# Patient Record
Sex: Female | Born: 1960 | Race: White | Hispanic: No | Marital: Single | Smoking: Former smoker
Health system: Southern US, Community
[De-identification: ages and names within clinical notes are randomized; demographics above are authoritative.]

## PROBLEM LIST (undated history)

## (undated) DIAGNOSIS — K319 Disease of stomach and duodenum, unspecified: Secondary | ICD-10-CM

## (undated) DIAGNOSIS — G4730 Sleep apnea, unspecified: Secondary | ICD-10-CM

## (undated) DIAGNOSIS — R51 Headache: Secondary | ICD-10-CM

## (undated) DIAGNOSIS — I10 Essential (primary) hypertension: Secondary | ICD-10-CM

## (undated) DIAGNOSIS — T7840XA Allergy, unspecified, initial encounter: Secondary | ICD-10-CM

## (undated) DIAGNOSIS — R519 Headache, unspecified: Secondary | ICD-10-CM

## (undated) DIAGNOSIS — E039 Hypothyroidism, unspecified: Secondary | ICD-10-CM

## (undated) DIAGNOSIS — K219 Gastro-esophageal reflux disease without esophagitis: Secondary | ICD-10-CM

## (undated) DIAGNOSIS — E785 Hyperlipidemia, unspecified: Secondary | ICD-10-CM

## (undated) DIAGNOSIS — M5416 Radiculopathy, lumbar region: Secondary | ICD-10-CM

## (undated) DIAGNOSIS — R911 Solitary pulmonary nodule: Secondary | ICD-10-CM

## (undated) HISTORY — DX: Allergy, unspecified, initial encounter: T78.40XA

## (undated) HISTORY — PX: ESOPHAGOGASTRODUODENOSCOPY: SHX1529

## (undated) HISTORY — PX: BLADDER SURGERY: SHX569

## (undated) HISTORY — PX: APPENDECTOMY: SHX54

## (undated) HISTORY — DX: Hyperlipidemia, unspecified: E78.5

## (undated) HISTORY — DX: Essential (primary) hypertension: I10

## (undated) HISTORY — PX: FOOT SURGERY: SHX648

---
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
## 1980-09-14 HISTORY — PX: ELBOW SURGERY: SHX618

## 2005-09-14 HISTORY — PX: FOOT SURGERY: SHX648

## 2006-09-14 HISTORY — PX: APPENDECTOMY: SHX54

## 2009-04-20 ENCOUNTER — Ambulatory Visit: Payer: Self-pay | Admitting: Internal Medicine

## 2009-05-30 ENCOUNTER — Ambulatory Visit: Payer: Self-pay | Admitting: Gastroenterology

## 2009-09-01 ENCOUNTER — Ambulatory Visit: Payer: Self-pay | Admitting: Internal Medicine

## 2009-09-01 IMAGING — CR DG RIBS 2V*R*
1 series · 4 of 4 positions shown · non-contrast
Comparison: none

REASON FOR EXAM: PAIN
COMMENTS:

PROCEDURE:     MDR - MDR RIBS RIGHT UNLILATERAL  - [DATE]  [DATE]
RESULT:     No evidence of displaced rib fracture or pneumothorax.

[Series 1: view not recorded · 0.17mm/px · 4 of 4 slices shown]
[im 1/4]
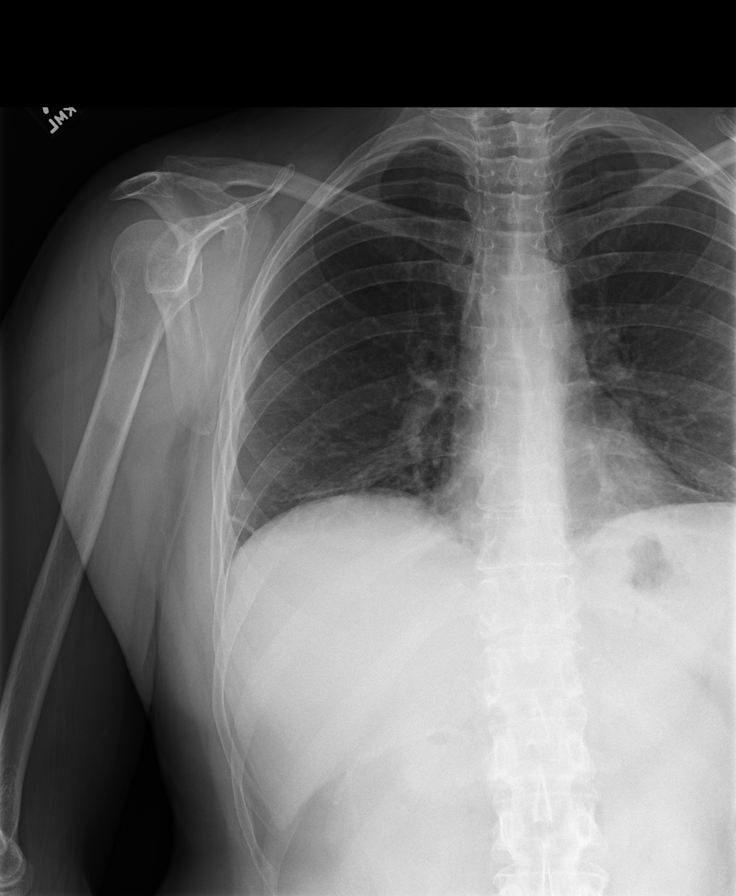
[im 2/4]
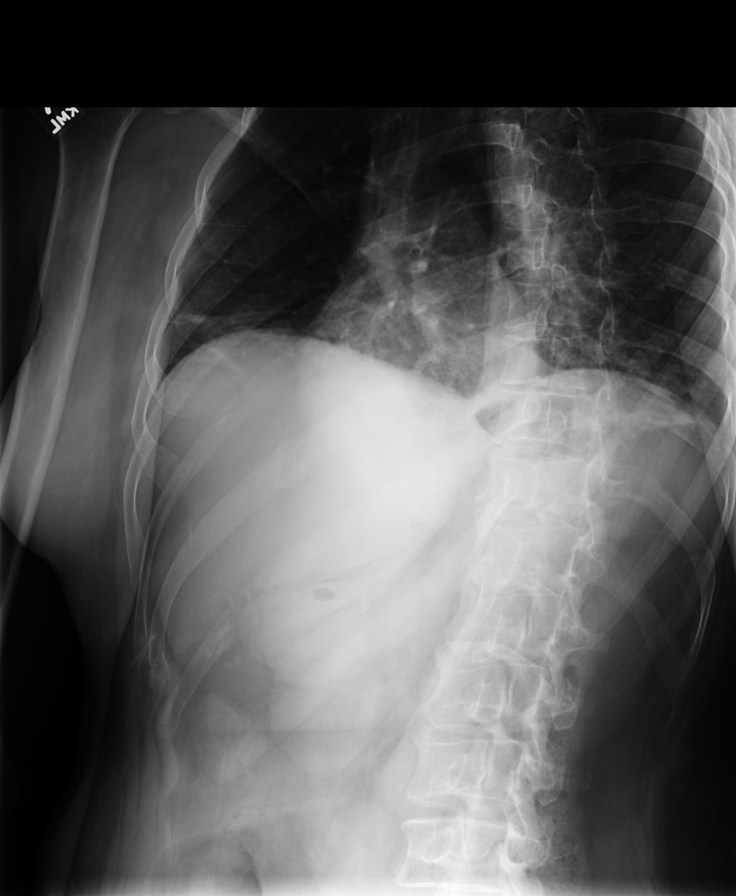
[im 3/4]
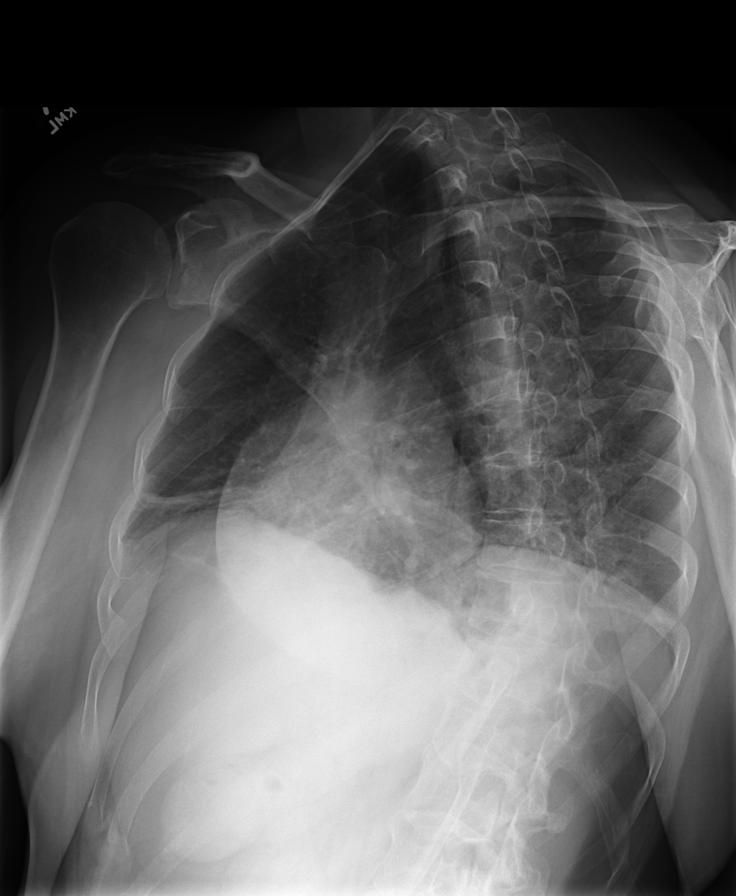
[im 4/4]
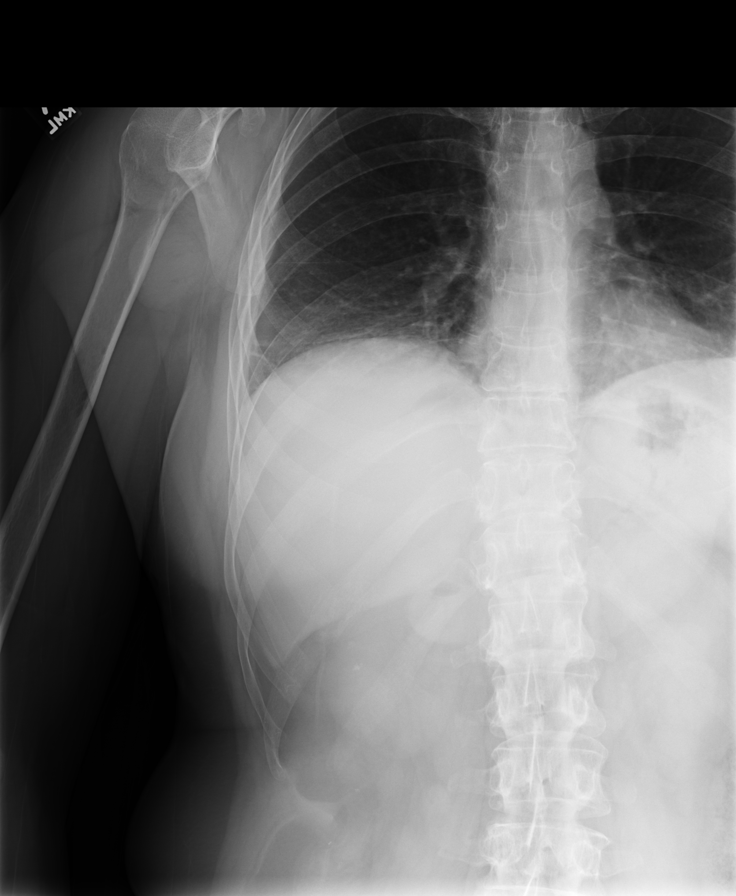

[4 of 4 positions shown; findings below may reference images not displayed]

IMPRESSION: No evidence of displaced rib fracture or pneumothorax. Mild basilar
atelectasis present on the right.

## 2011-06-25 ENCOUNTER — Ambulatory Visit: Payer: Self-pay | Admitting: Family Medicine

## 2012-09-01 ENCOUNTER — Ambulatory Visit: Payer: Self-pay | Admitting: Family Medicine

## 2012-09-01 IMAGING — MG MM DIGITAL SCREENING BILAT W/ CAD
1 series · 4 of 4 positions shown · non-contrast
Comparison: none

REASON FOR EXAM: SCR MAMMO NO ORDER
COMMENTS:

[R CC · right · 4 of 4 slices shown]
[im 1/4]
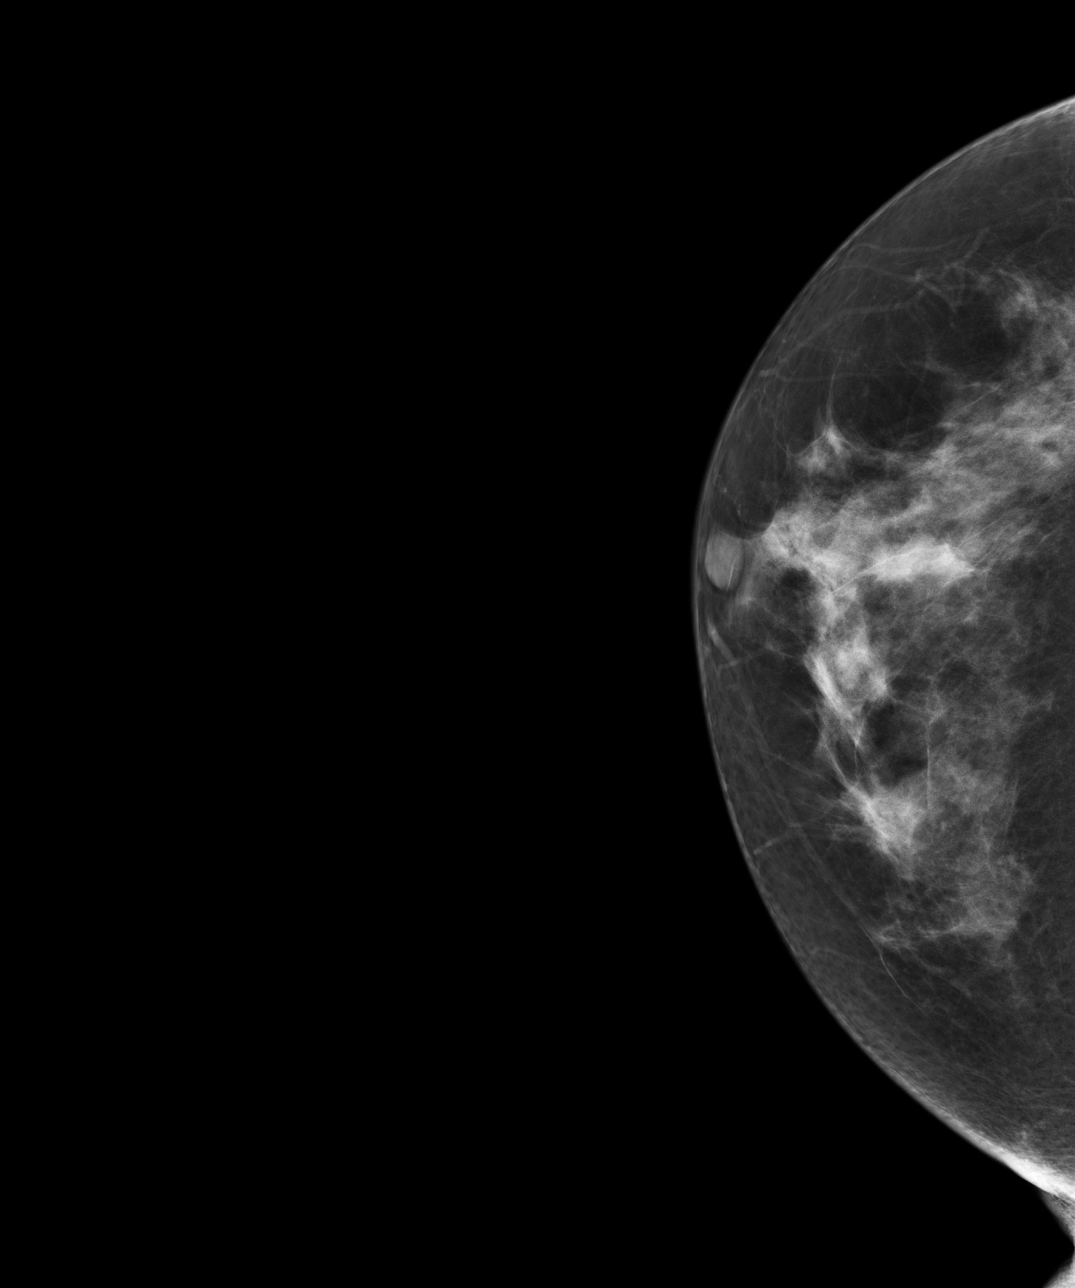
[im 2/4]
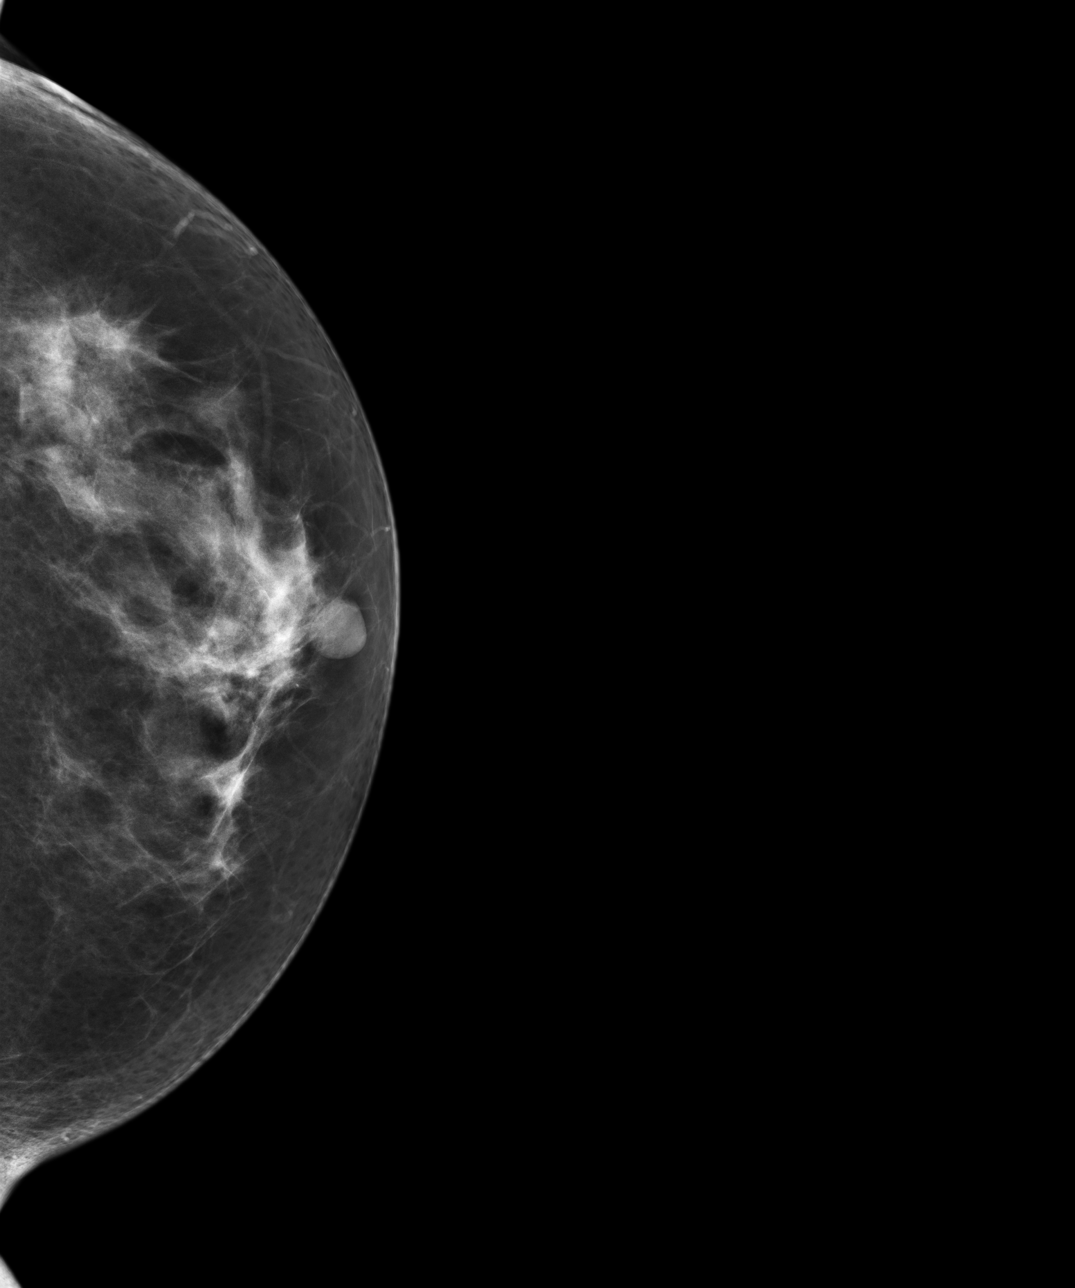
[im 3/4]
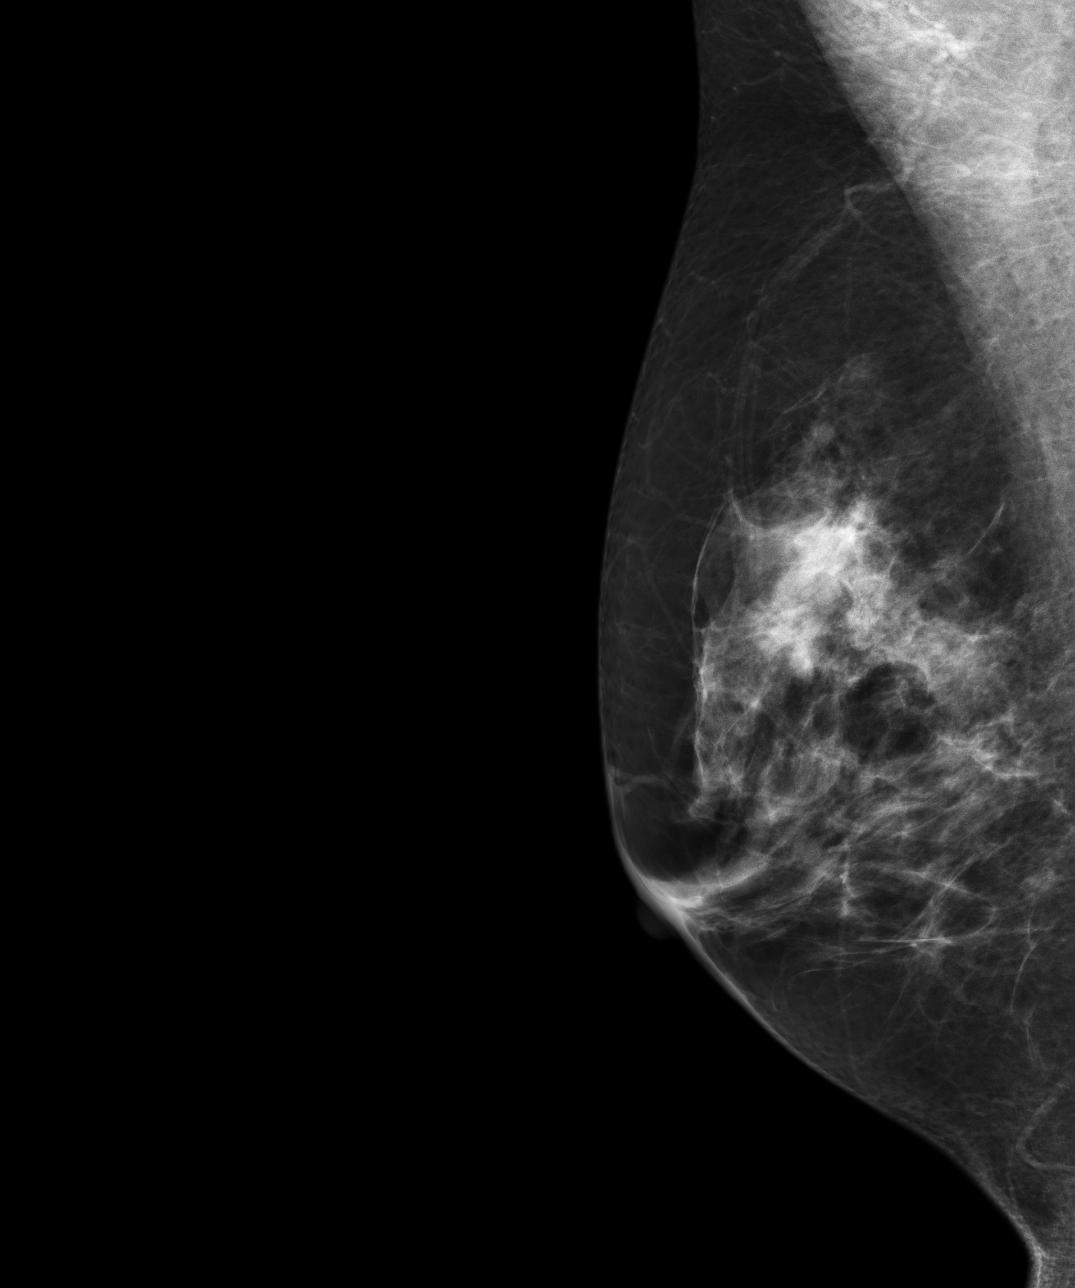
[im 4/4]
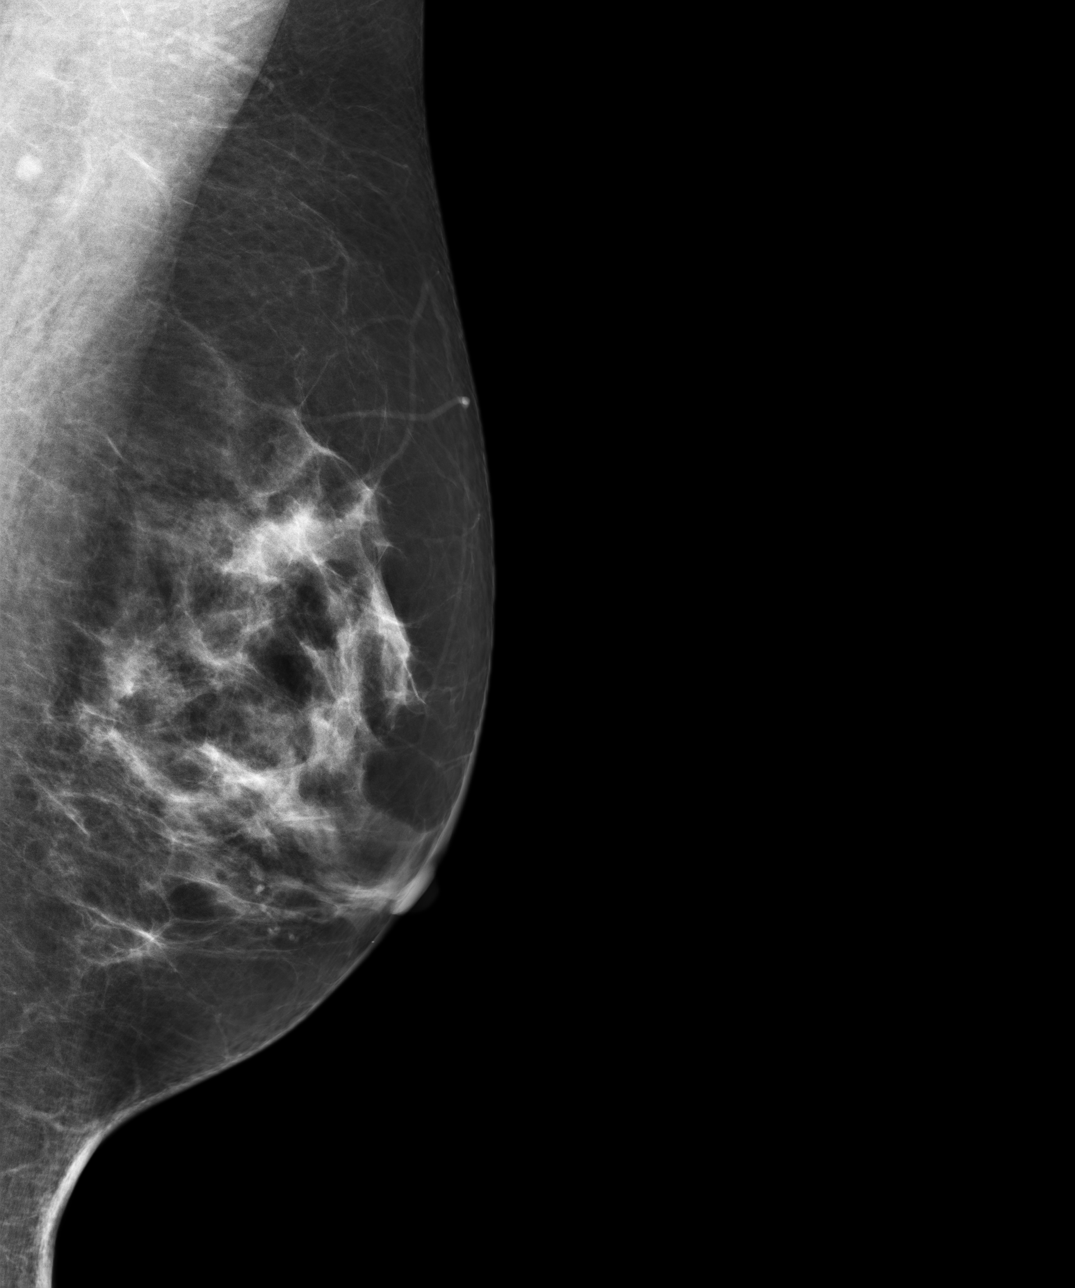

[4 of 4 positions shown; findings below may reference images not displayed]

PROCEDURE:     MMM - MMM DGT SCR NO ORDER W/CAD  - [DATE]  [DATE]

RESULT:     Comparison made to previous digital studies [DATE],[DATE], and [DATE] [GV].

The breasts exhibit a heterogeneously dense parenchymal pattern. No
malignant appearing groupings of microcalcification are demonstrated. On the
right there is increased density demonstrated in the midportion of the
breast superiorly. The area in question has been marked electronically and
the images saved.
IMPRESSION: There is increased conspicuity of nodularity on the right
superiorly at approximately the [DATE] position. This merits further
evaluation with spot compression views and a true lateral film. The area in
question has been marked electronically and the images saved.

BI-RADS 0: Additional workup of the right breast with supplemental
mammographic views and possible ultrasound is recommended.

A NEGATIVE MAMMOGRAM REPORT DOES NOT PRECLUDE BIOPSY OR OTHER EVALUATION OF
A CLINICALLY PALPABLE OR OTHERWISE SUSPICIOUS MASS OR LESION. BREAST CANCER
MAY NOT BE DETECTED BY MAMMOGRAPHY IN UP TO 10% OF CASES.

[REDACTED]

## 2012-09-02 ENCOUNTER — Ambulatory Visit: Payer: Self-pay | Admitting: Family Medicine

## 2012-09-02 IMAGING — MG MM ADDITIONAL VIEWS AT NO CHARGE
1 series · 4 of 4 positions shown · non-contrast
Comparison: none

REASON FOR EXAM: av rt nodularity
COMMENTS:

[R ML · right · 4 of 4 slices shown]
[im 1/4]
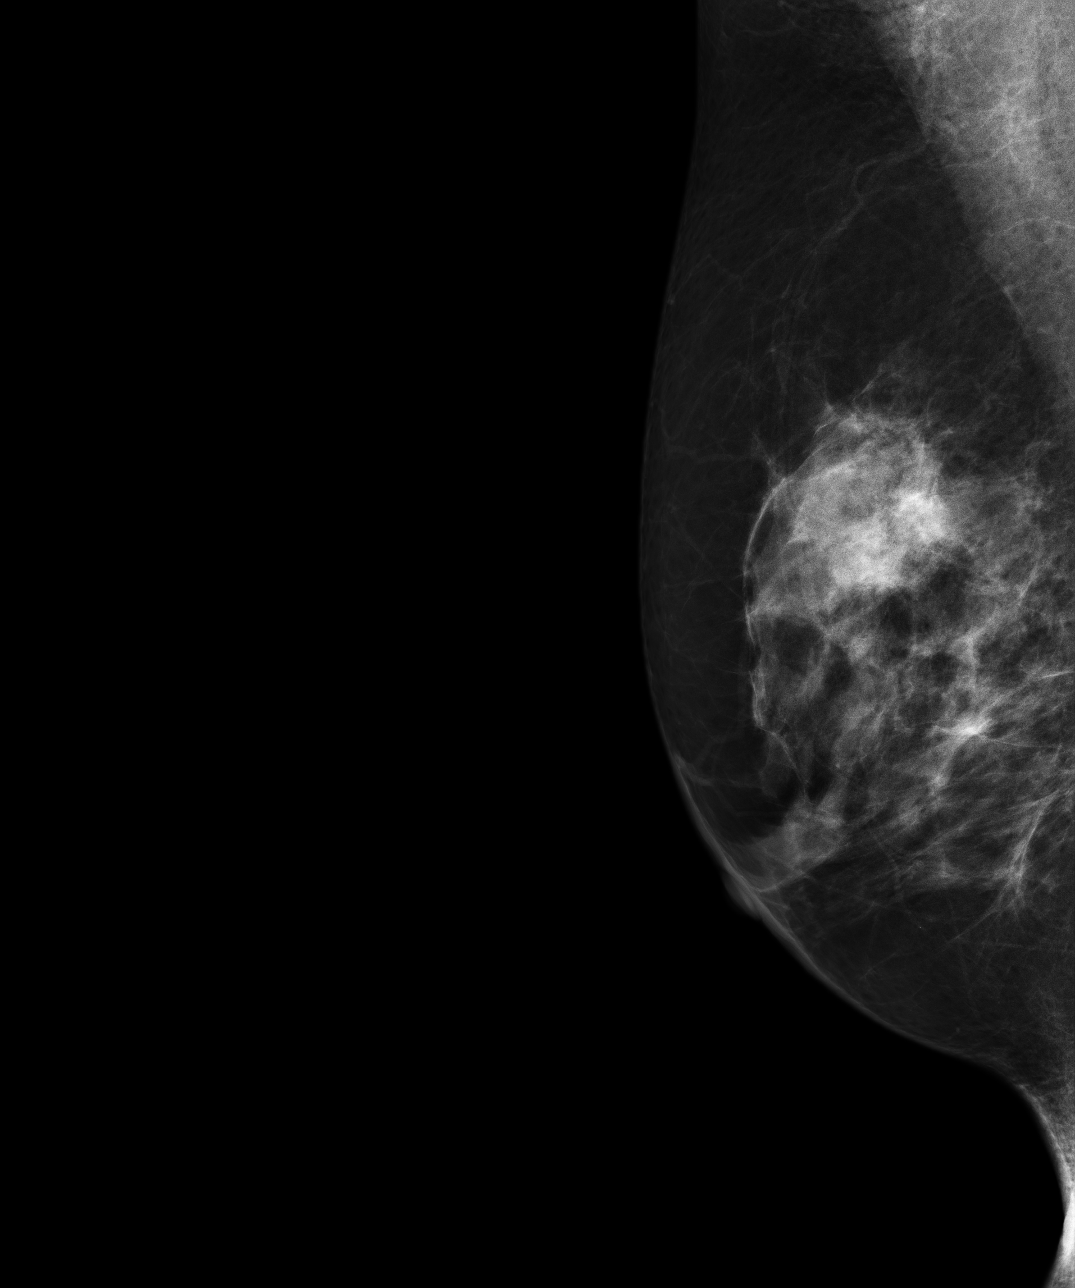
[im 2/4]
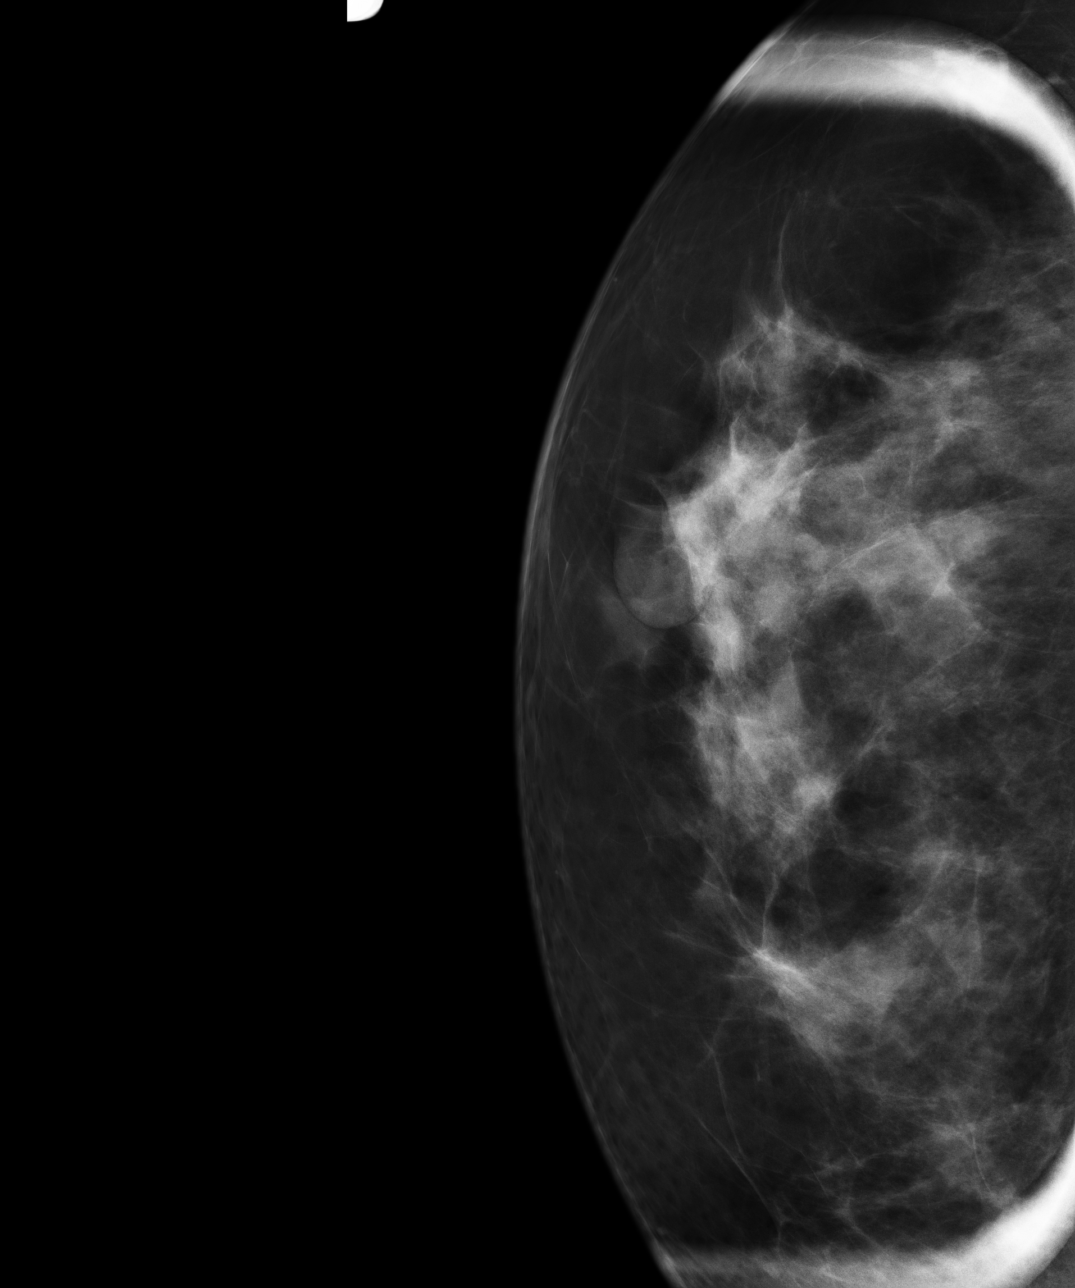
[im 3/4]
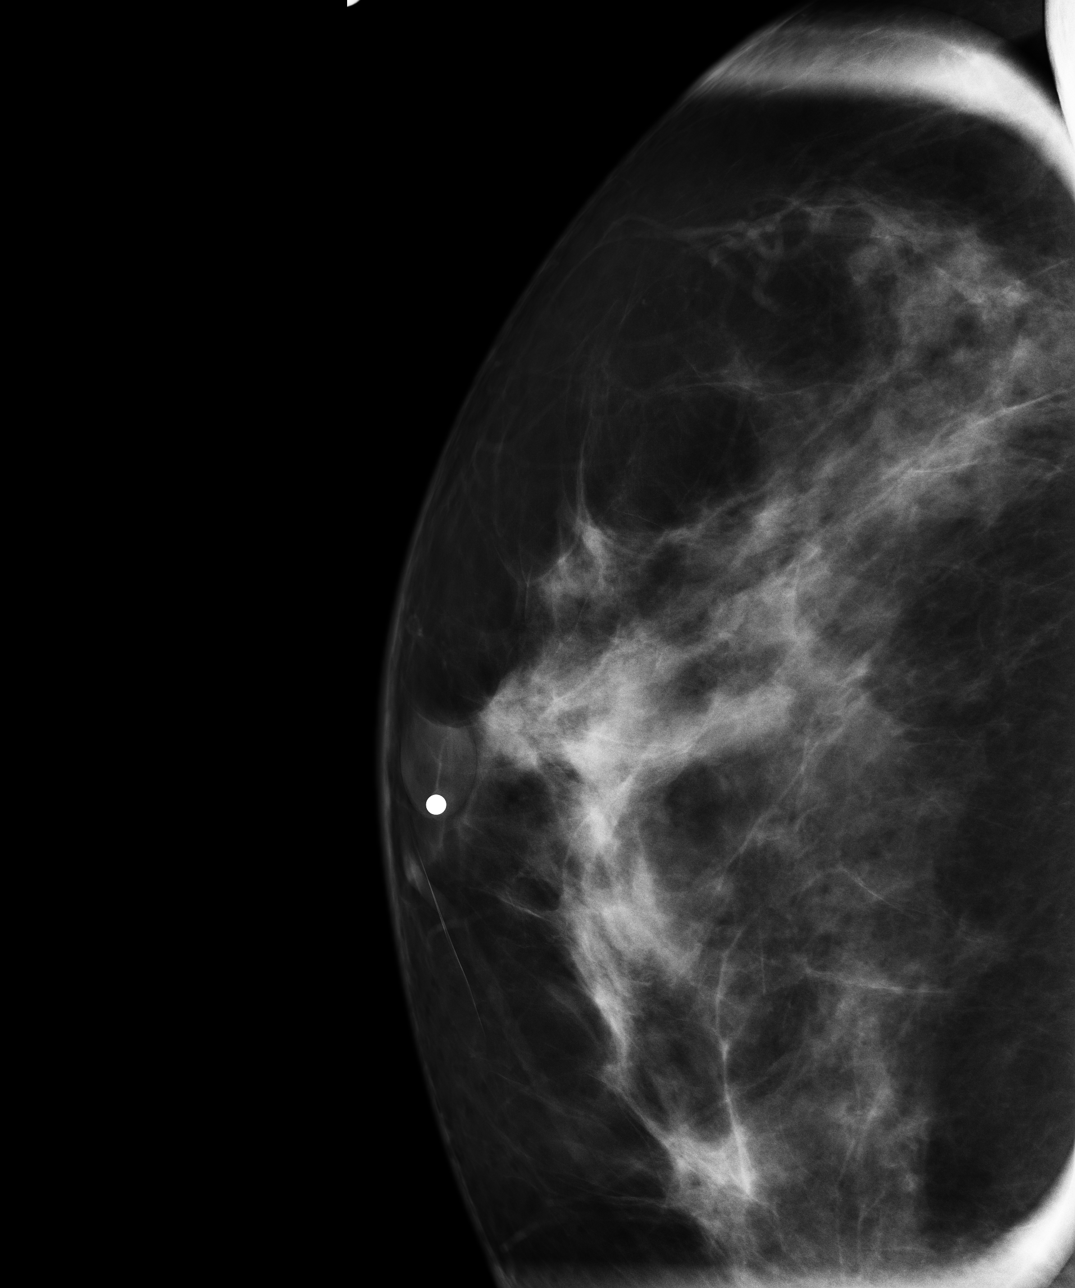
[im 4/4]
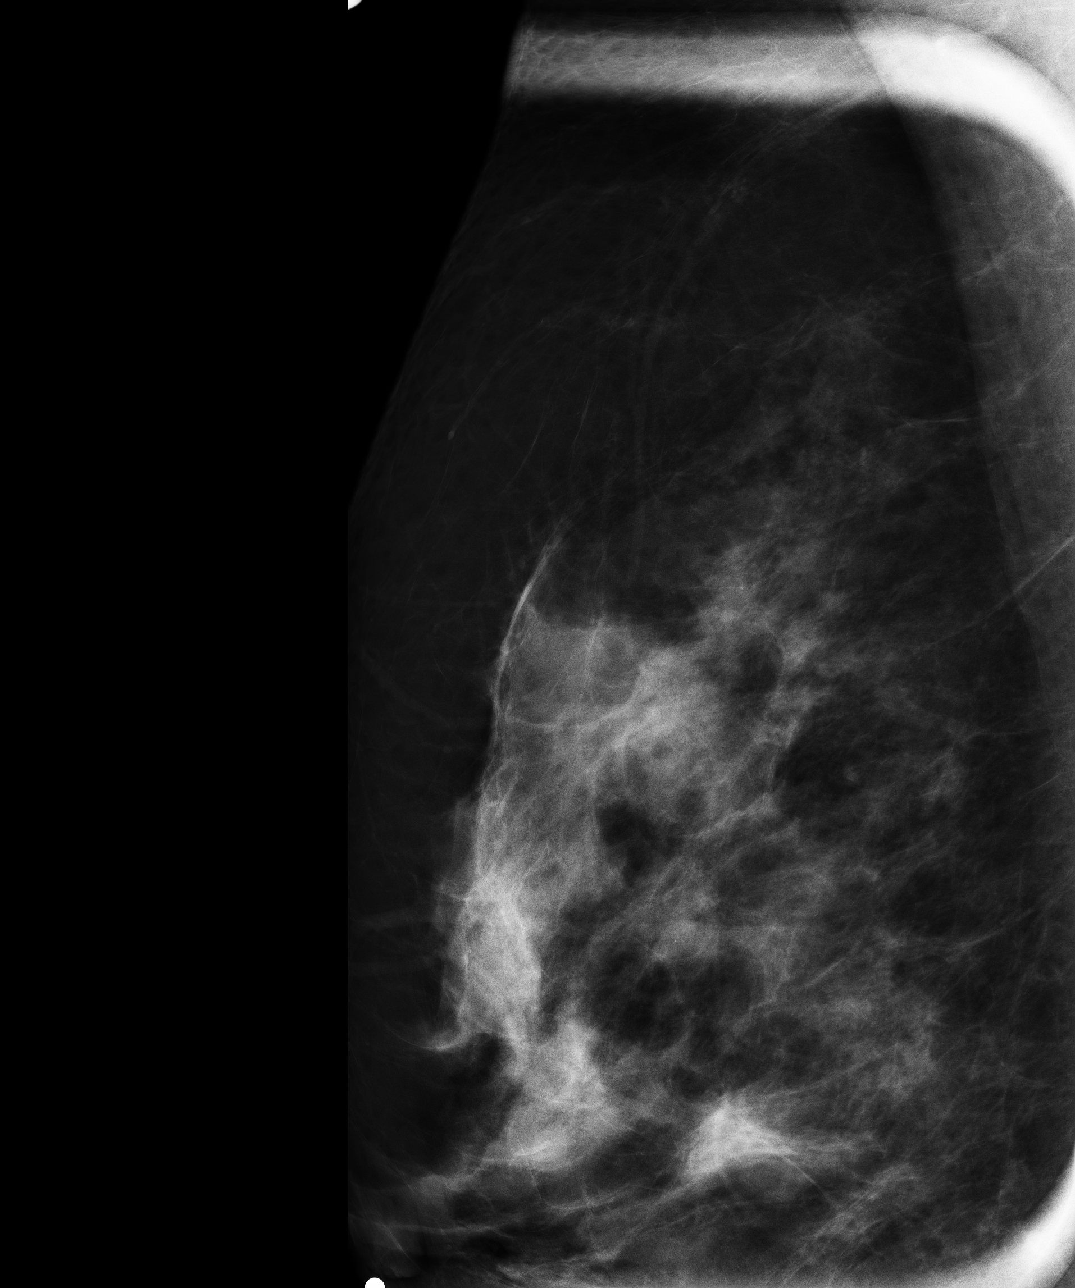

[4 of 4 positions shown; findings below may reference images not displayed]

PROCEDURE:     MAM - MAM DIG ADDVIEWS RT SCR  - [DATE]  [DATE]

RESULT:     On the screening mammogram [DATE], nodularity was
noted superiorly on the right which was felt to correspond to the density
seen along the medial aspect of the right breast as well as in the deep
retroareolar region. The patient was asked to return today for supplemental
mammographic views and ultrasound.

Spot compression MLO and CC views were obtained. A true lateral film was
obtained as well. The breast parenchyma is moderately dense. No discrete
masses are demonstrated. Ultrasound performed today revealed dilated ducts
at the [DATE] position.
IMPRESSION: The ultrasound images as well as a supplemental
mammographic views do not reveal findings suspicious for developing mass.

BI-RADS 3: A 6 month followup mammogram of the right breast is recommended
to assure ongoing stability. Continued annual mammographic followup of the
left breast is recommended.

A NEGATIVE MAMMOGRAM REPORT DOES NOT PRECLUDE BIOPSY OR OTHER EVALUATION OF
A CLINICALLY PALPABLE OR OTHERWISE SUSPICIOUS MASS OR LESION. BREAST CANCER
MAY NOT BE DETECTED BY MAMMOGRAPHY IN UP TO 10% OF CASES.

[REDACTED]

## 2012-09-02 IMAGING — US ULTRASOUND RIGHT BREAST
1 series · 14 of 16 positions shown · non-contrast
Comparison: none

REASON FOR EXAM: av rt nodularity
COMMENTS:

PROCEDURE:     US  - US BREAST RIGHT  - [DATE]  [DATE]
RESULT:     The patient underwent ultrasound of the anterior and mid aspects
of the right breast from the [DATE] to the [DATE] position. There are a few
mildly dilated ducts at the [DATE] position with the largest measuring just
over 3 mm.

[Series 1: ultrasound right breast · 0.08mm/px · 14 of 16 slices shown]
[im 1/16]
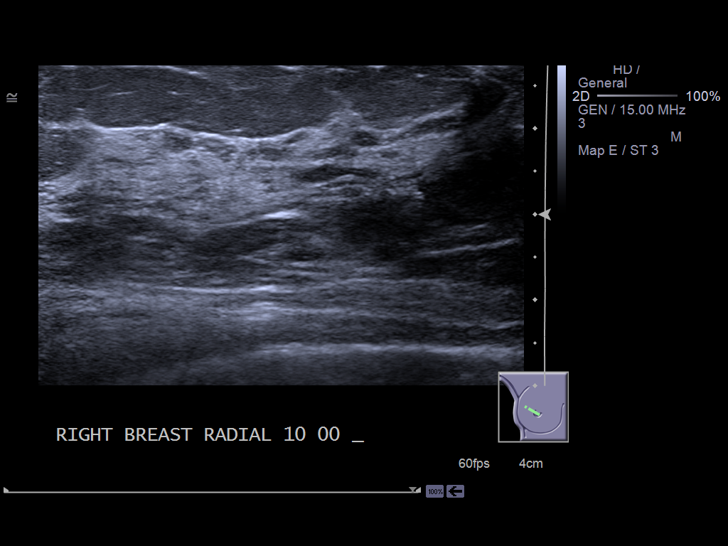
[im 2/16]
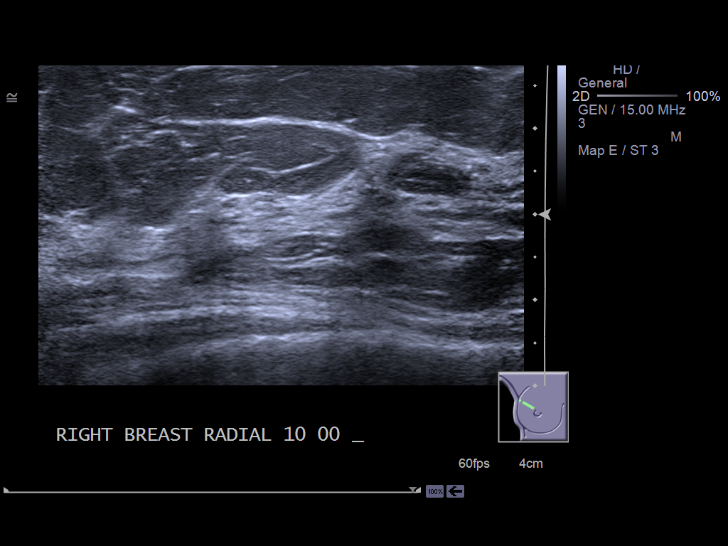
[im 3/16]
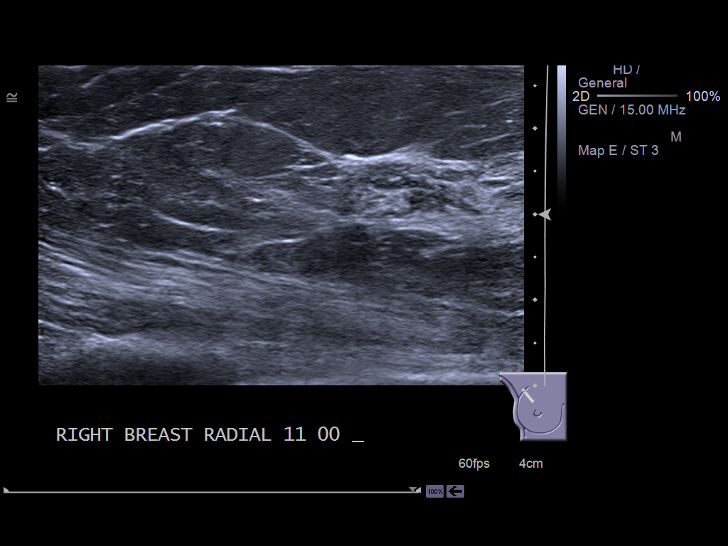
[im 5/16]
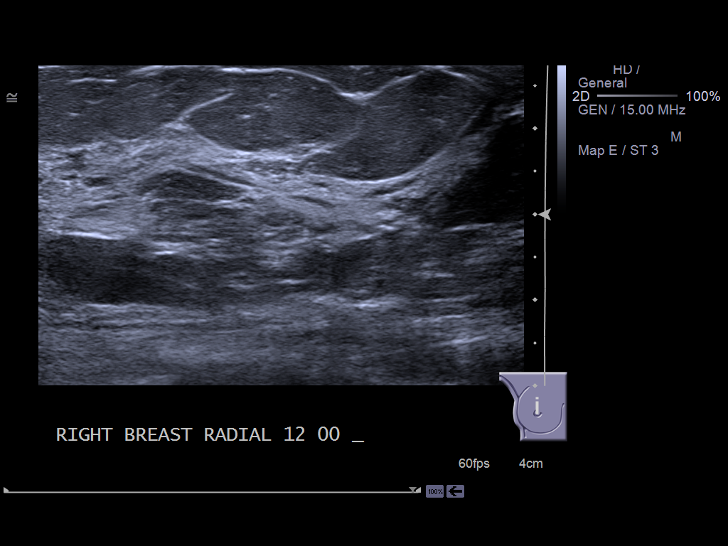
[im 6/16]
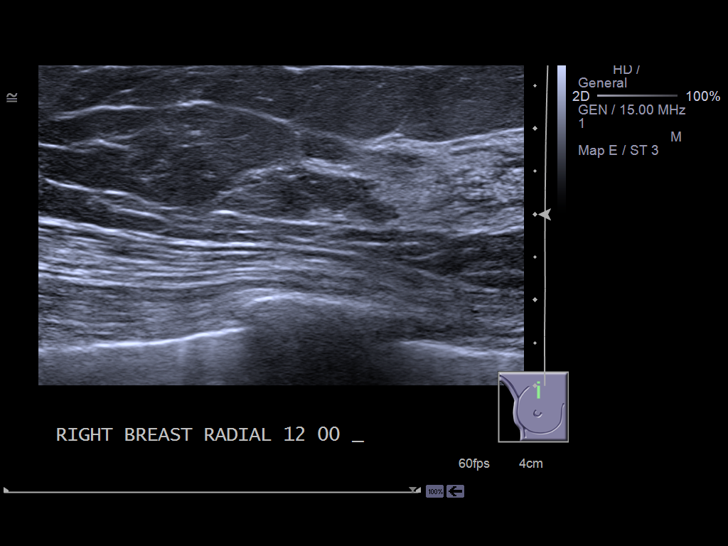
[im 7/16]
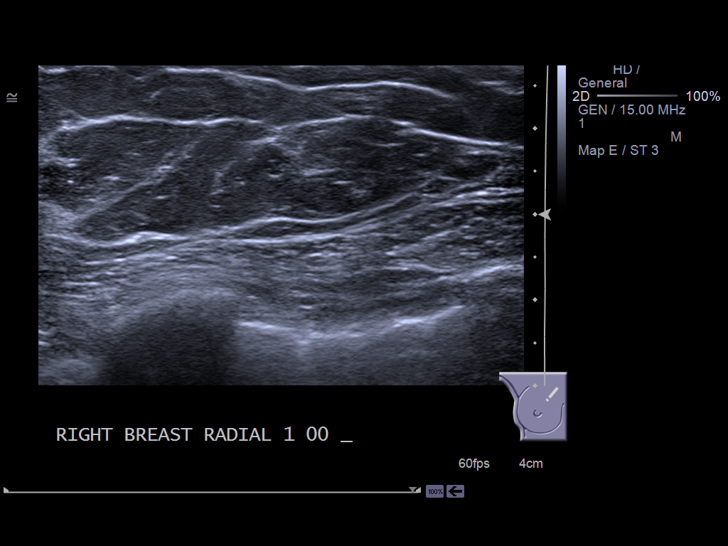
[im 8/16]
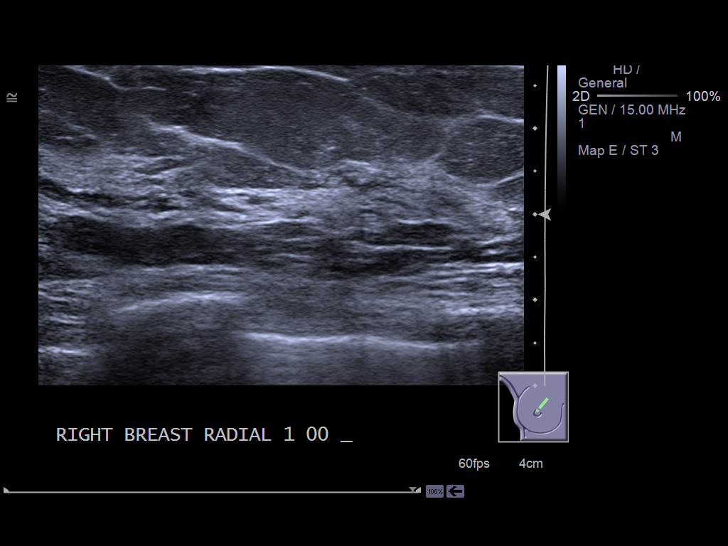
[im 9/16]
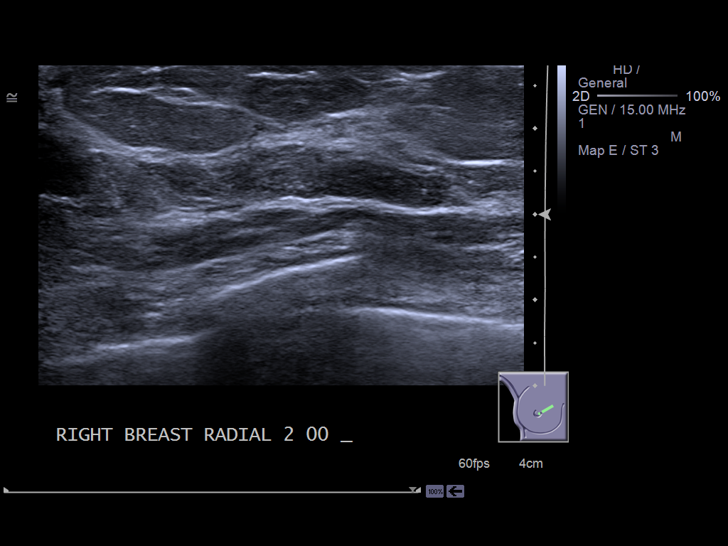
[im 10/16]
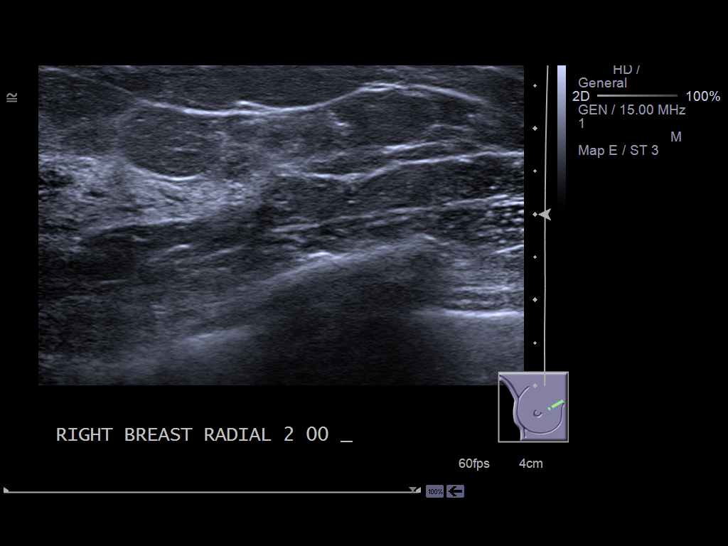
[im 11/16]
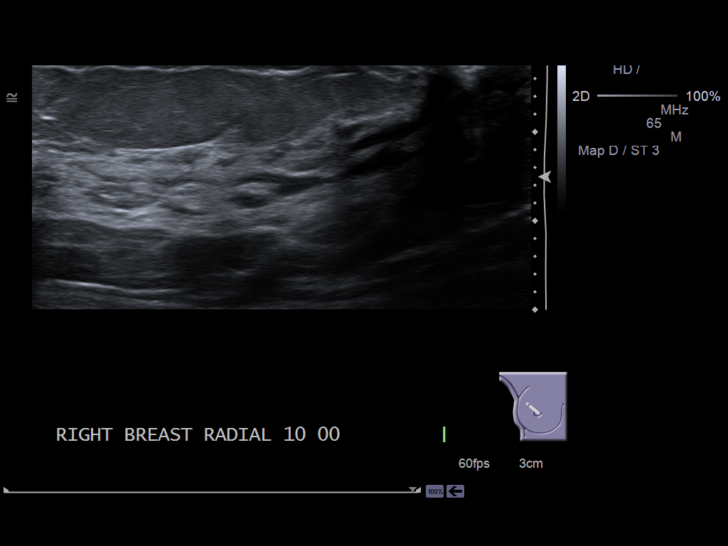
[im 13/16]
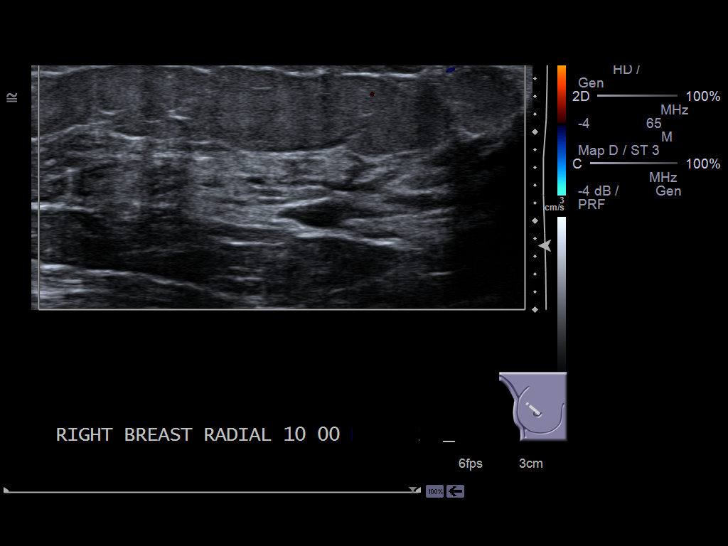
[im 14/16]
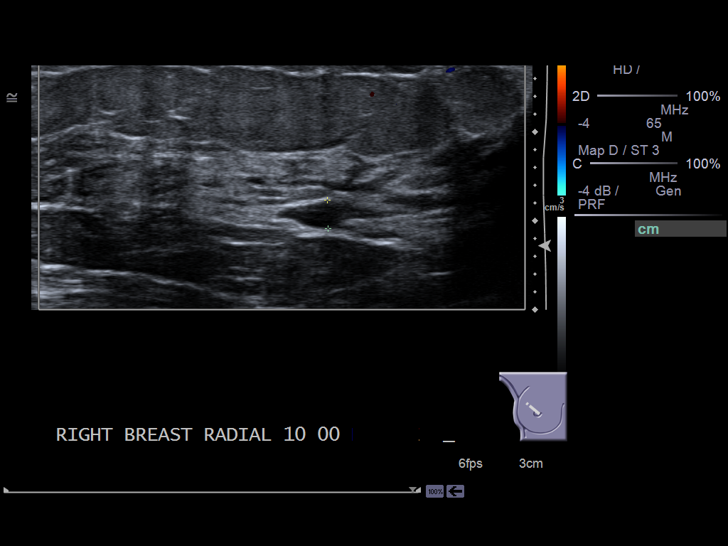
[im 15/16]
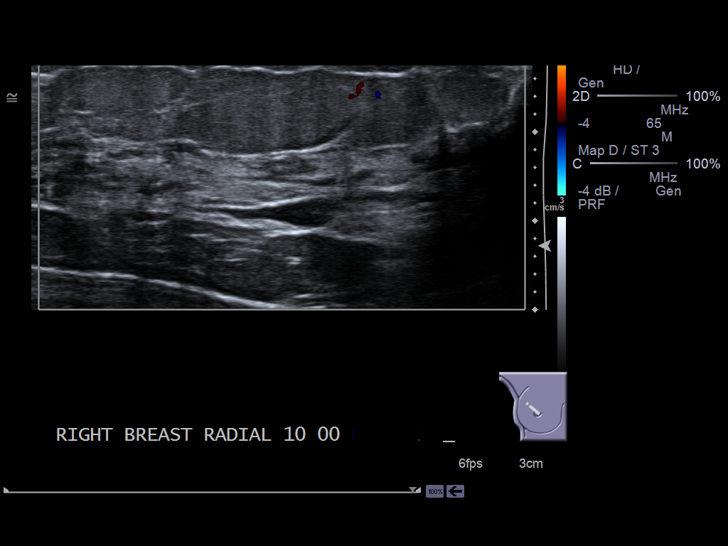
[im 16/16]
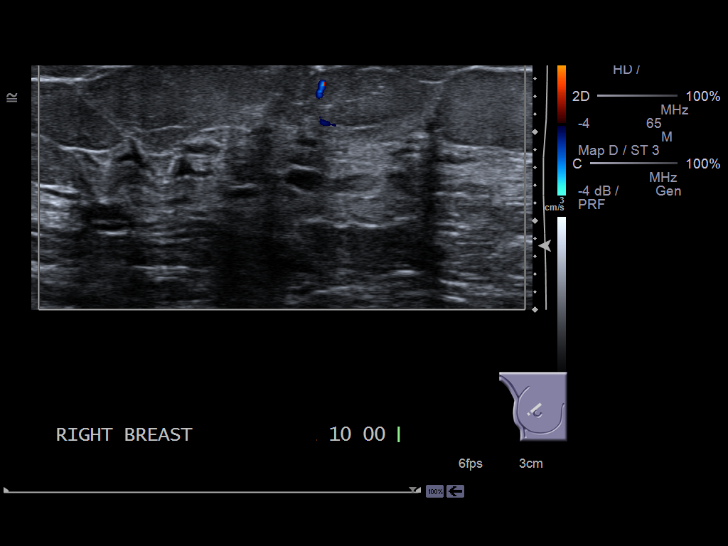

[14 of 16 positions shown; findings below may reference images not displayed]

IMPRESSION: No suspicious masses are demonstrated in the imaged
portions of the right breast. Please see the dictation of the supplemental
mammographic views of the right breast mammogram of of today's date for
final recommendations and BI-RADS classification.

A NEGATIVE MAMMOGRAM REPORT DOES NOT PRECLUDE BIOPSY OR OTHER EVALUATION OF
A CLINICALLY PALPABLE OR OTHERWISE SUSPICIOUS MASS OR LESION. BREAST CANCER
MAY NOT BE DETECTED BY MAMMOGRAPHY IN UP TO 10% OF CASES.

[REDACTED]

## 2013-03-21 ENCOUNTER — Ambulatory Visit: Payer: Self-pay | Admitting: Family Medicine

## 2013-03-21 IMAGING — MG MM MAMMO DIAGNOSTIC UNILATERAL*R*
1 series · 5 of 5 positions shown · non-contrast
Comparison: none

REASON FOR EXAM: RT BR NODULARITY
COMMENTS:

[R CC · right · 5 of 5 slices shown]
[im 1/5]
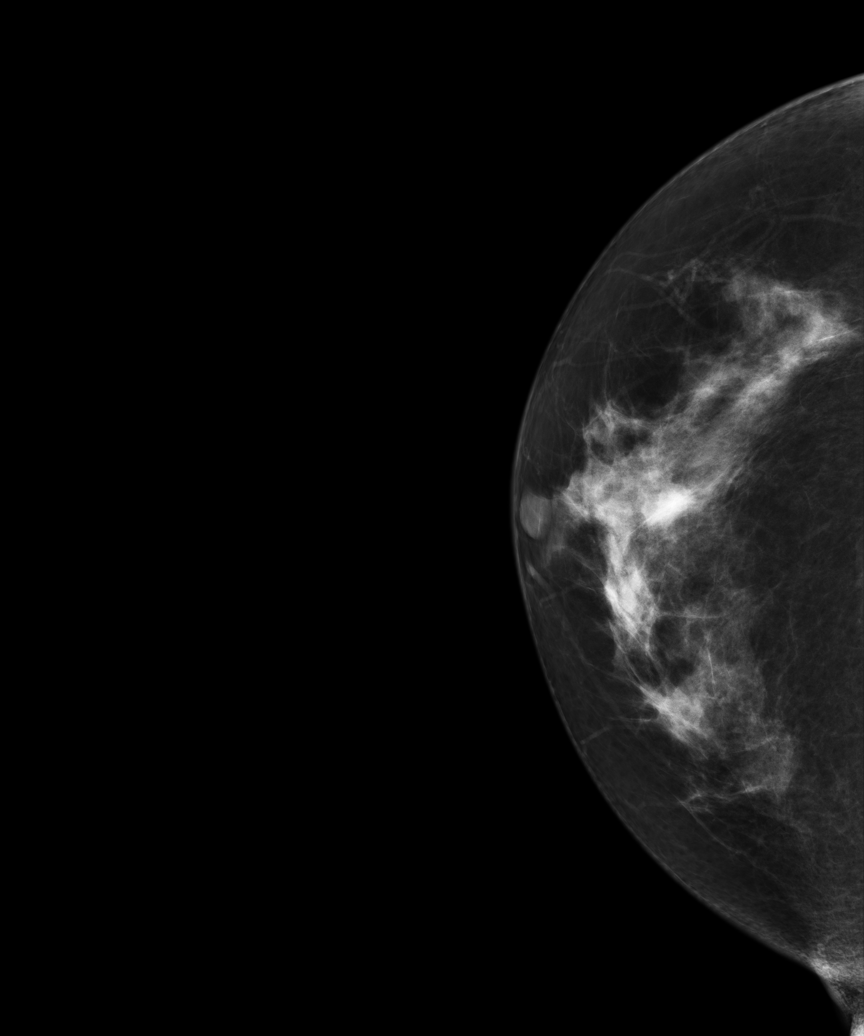
[im 2/5]
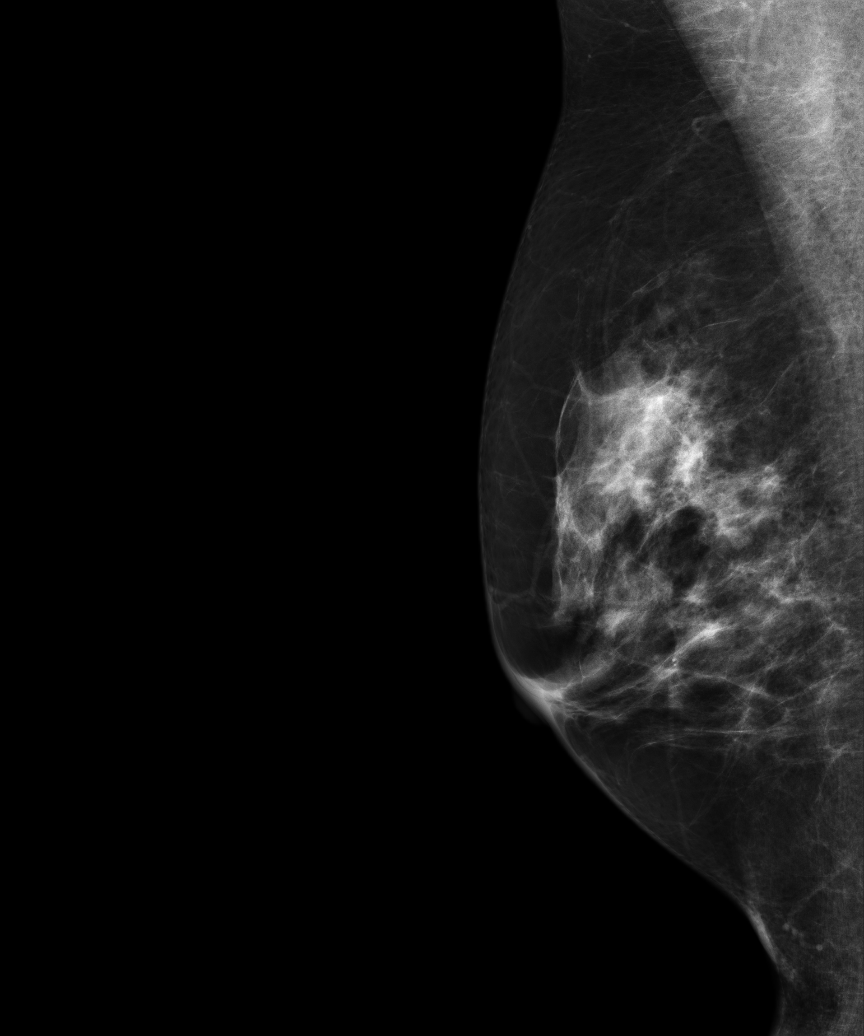
[im 3/5]
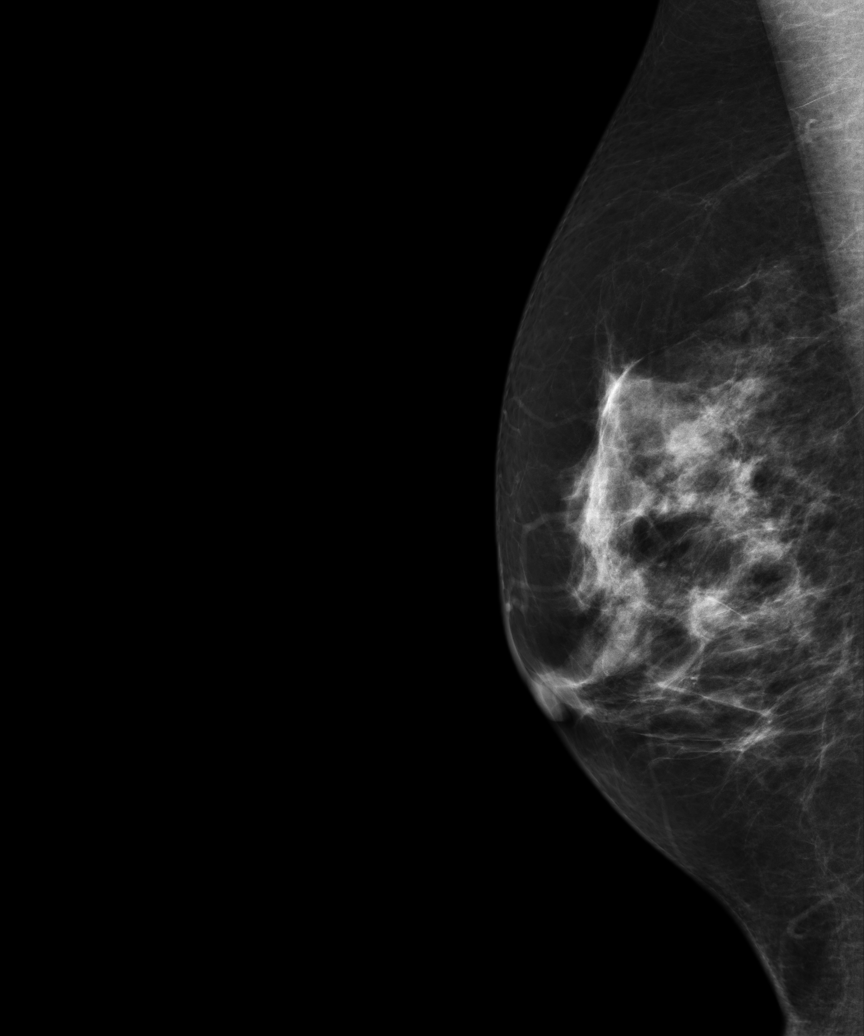
[im 4/5]
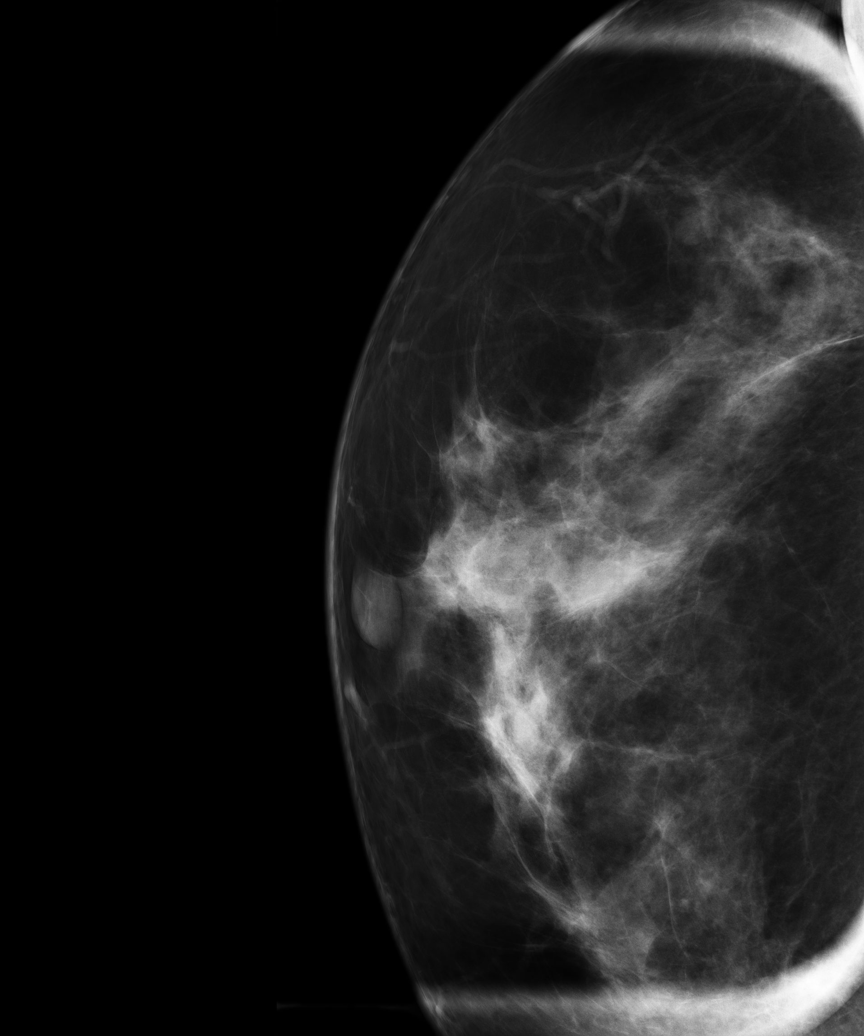
[im 5/5]
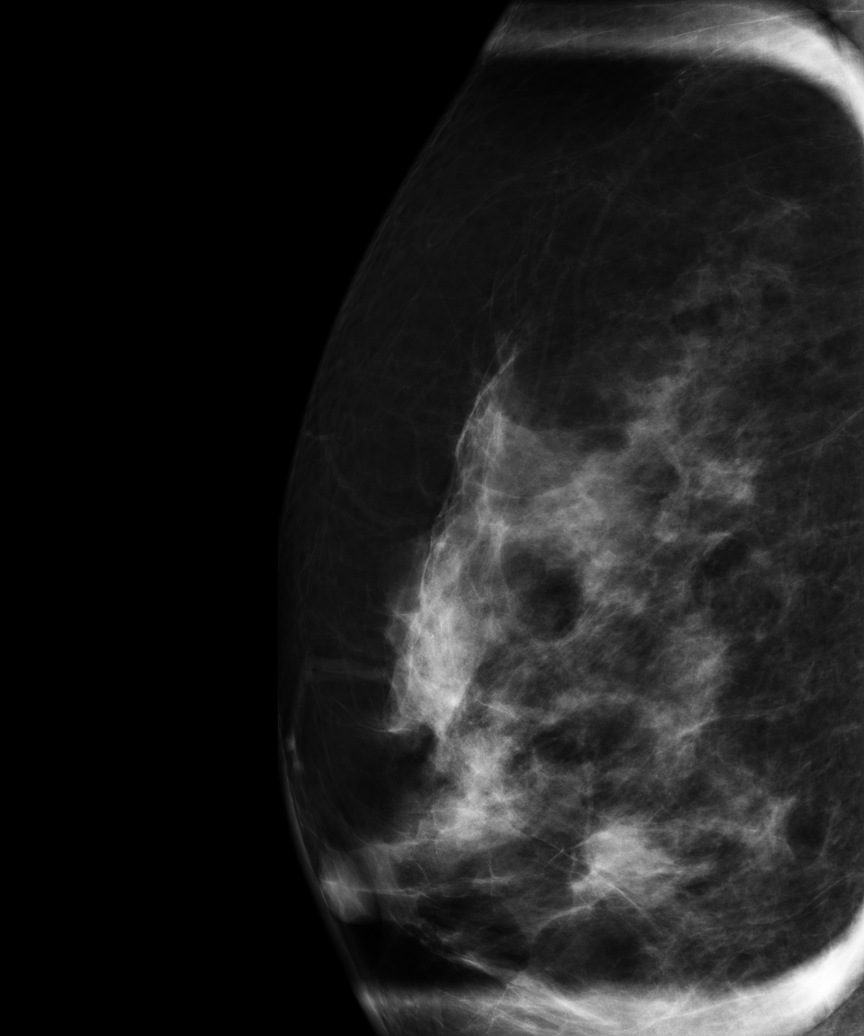

[5 of 5 positions shown; findings below may reference images not displayed]

PROCEDURE:     MAM - MAM DGTL UNI MAM RT BREAST W/CAD  - [DATE] [DATE]

RESULT:     The current exam is a 6 month followup study from [DATE],
at which time the patient was undergoing evaluation of parenchymal density
in the upper aspect of the right breast. At that time ultrasound and spot
compression views were not suspicious. It was felt prudent to proceed with 6
month followup mammography.

The right breast reveals a scattered fibroglandular pattern. There is an
area of nodularity noted in the anterior aspect of the breast in the
retroareolar region demonstrated best on the cc view. This appears stable.
There are no malignant appearing groupings of microcalcification and there
are no areas of new architectural distortion.
IMPRESSION: There are no findings suspicious for malignancy.

BI-RADS 2: Benign findings.

Recommendation: please return to yearly mammographic followup of both
breasts i.e. in [DATE].

BREAST COMPOSITION: The breast composition is HETEROGENEOUSLY DENSE
(glandular tissue is 51-75%) This may decrease the sensitivity of
mammography.

A NEGATIVE MAMMOGRAM REPORT DOES NOT PRECLUDE BIOPSY OR OTHER EVALUATION OF
A CLINICALLY PALPABLE OR OTHERWISE SUSPICIOUS MASS OR LESION. BREAST CANCER
MAY NOT BE DETECTED BY MAMMOGRAPHY IN UP TO 10% OF CASES.

Dictation site:1

## 2014-03-27 ENCOUNTER — Ambulatory Visit: Payer: Self-pay | Admitting: Family Medicine

## 2014-03-27 IMAGING — MG MM DIGITAL SCREENING BILAT W/ CAD
1 series · 4 of 4 positions shown · non-contrast
Comparison: Previous exam(s).

CLINICAL DATA: Screening.

EXAM:
DIGITAL SCREENING BILATERAL MAMMOGRAM WITH CAD

[R CC · right · 4 of 4 slices shown]
[im 1/4]
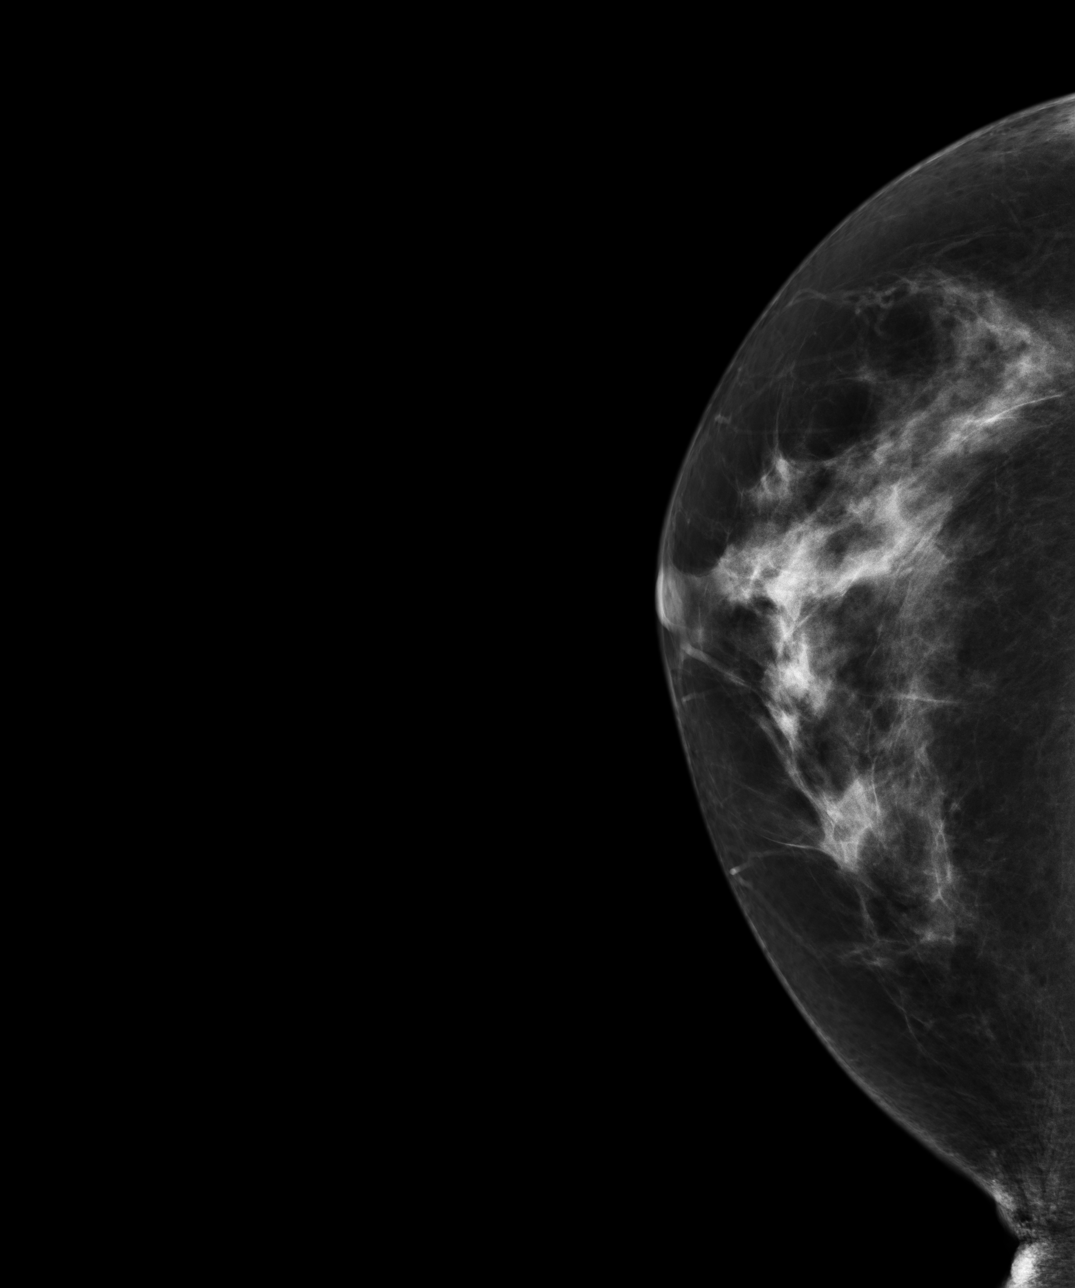
[im 2/4]
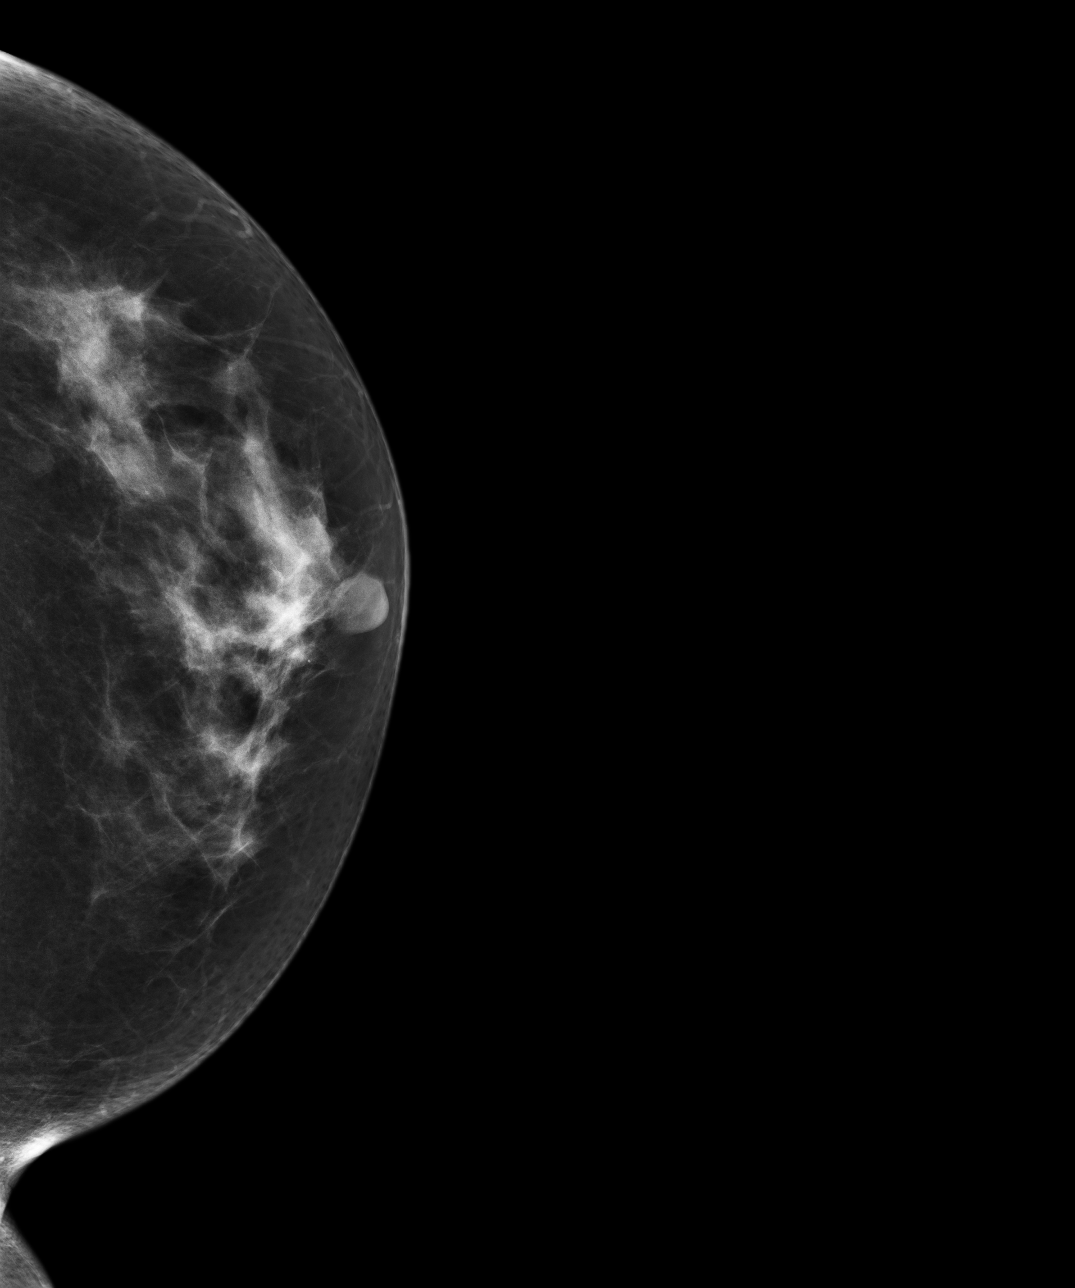
[im 3/4]
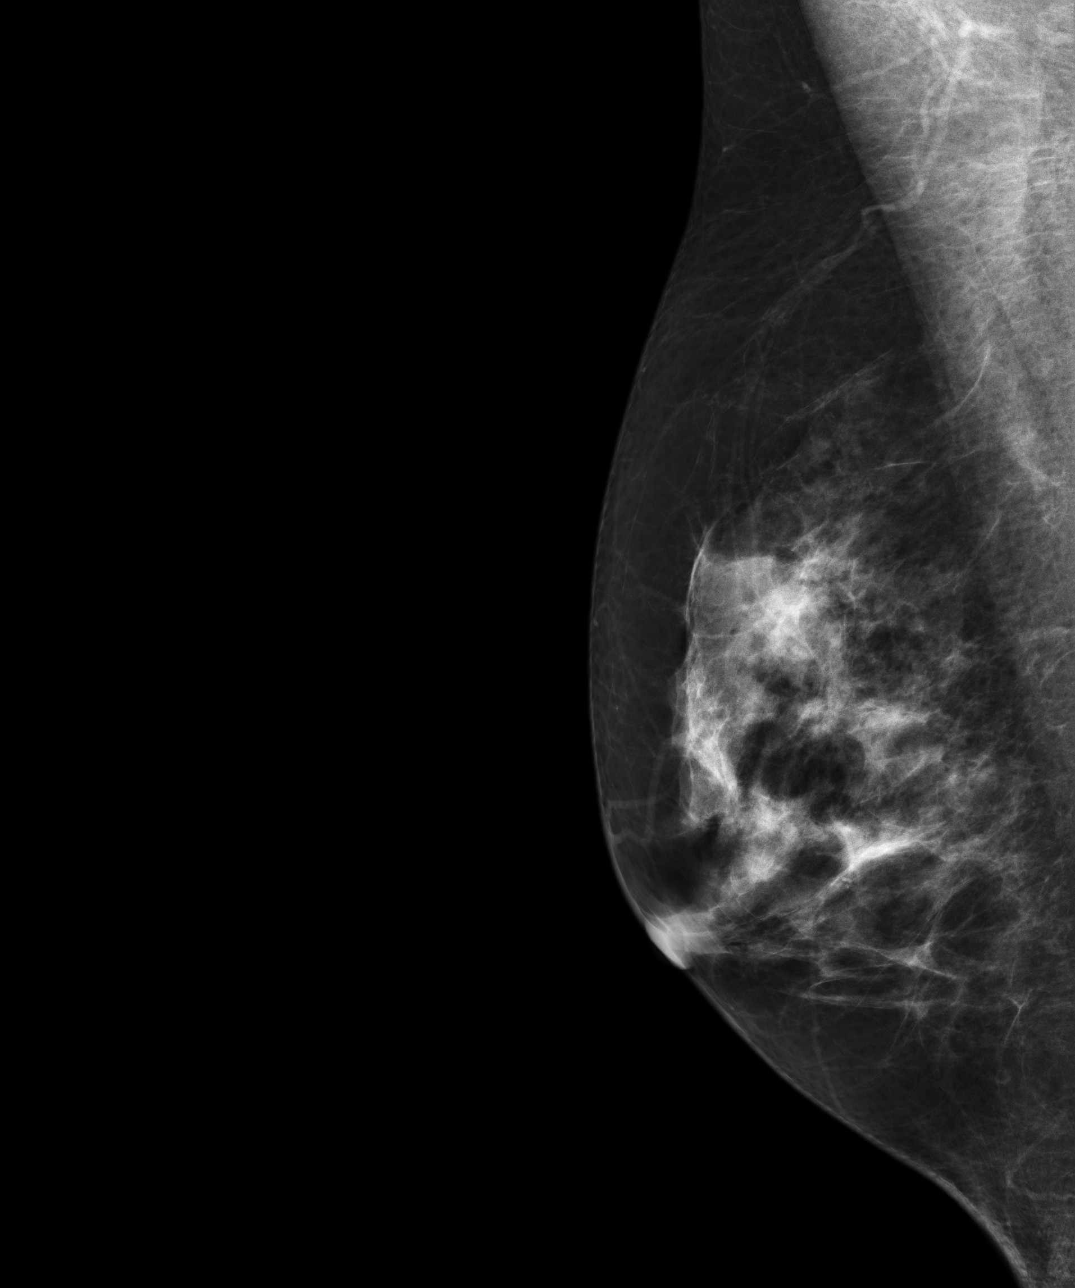
[im 4/4]
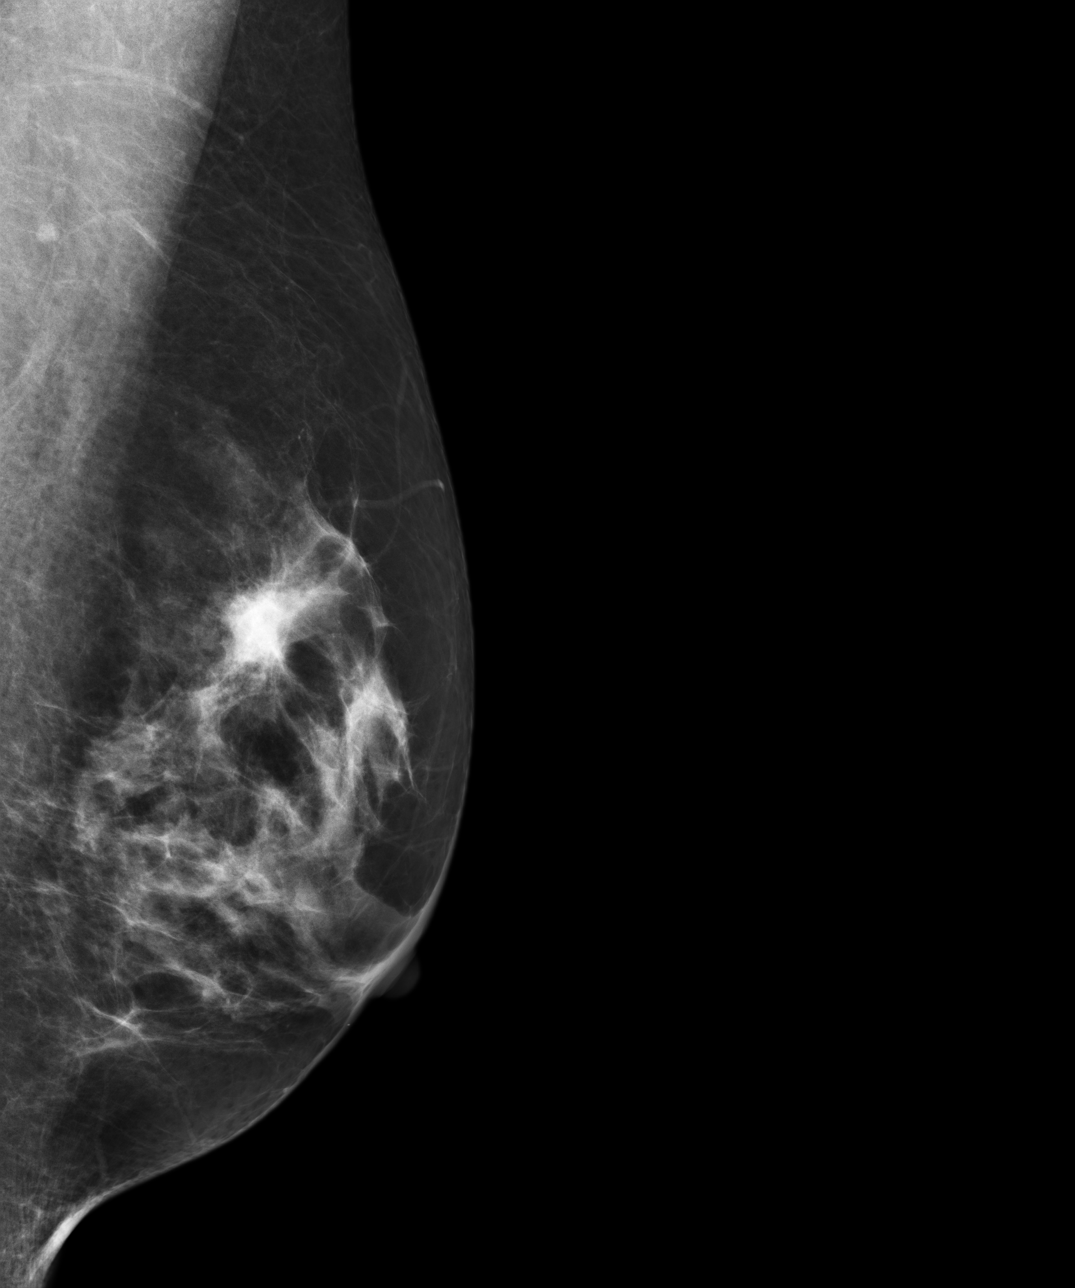

[4 of 4 positions shown; findings below may reference images not displayed]

ACR Breast Density Category c: The breast tissue is heterogeneously
dense, which may obscure small masses.
FINDINGS: There are no findings suspicious for malignancy. Images were
processed with CAD.
IMPRESSION: No mammographic evidence of malignancy. A result letter of this
screening mammogram will be mailed directly to the patient.

RECOMMENDATION:
Screening mammogram in one year. (Code:[0J])

BI-RADS CATEGORY  1: Negative.

## 2014-11-28 LAB — HM HIV SCREENING LAB: HM HIV Screening: NEGATIVE

## 2015-02-19 ENCOUNTER — Encounter: Payer: Self-pay | Admitting: *Deleted

## 2015-02-19 ENCOUNTER — Ambulatory Visit: Payer: BLUE CROSS/BLUE SHIELD | Admitting: Anesthesiology

## 2015-02-19 ENCOUNTER — Ambulatory Visit
Admission: RE | Admit: 2015-02-19 | Discharge: 2015-02-19 | Disposition: A | Discharge status: Home / Self Care | Payer: BLUE CROSS/BLUE SHIELD | Source: Ambulatory Visit | Attending: Gastroenterology | Admitting: Gastroenterology

## 2015-02-19 ENCOUNTER — Encounter
Admission: RE | Disposition: A | Discharge status: Home / Self Care | Payer: Self-pay | Source: Ambulatory Visit | Attending: Gastroenterology

## 2015-02-19 DIAGNOSIS — K621 Rectal polyp: Secondary | ICD-10-CM | POA: Diagnosis not present

## 2015-02-19 DIAGNOSIS — Z8371 Family history of colonic polyps: Secondary | ICD-10-CM | POA: Insufficient documentation

## 2015-02-19 DIAGNOSIS — D125 Benign neoplasm of sigmoid colon: Secondary | ICD-10-CM | POA: Diagnosis not present

## 2015-02-19 DIAGNOSIS — Z888 Allergy status to other drugs, medicaments and biological substances status: Secondary | ICD-10-CM | POA: Diagnosis not present

## 2015-02-19 DIAGNOSIS — F17210 Nicotine dependence, cigarettes, uncomplicated: Secondary | ICD-10-CM | POA: Insufficient documentation

## 2015-02-19 DIAGNOSIS — Z880 Allergy status to penicillin: Secondary | ICD-10-CM | POA: Insufficient documentation

## 2015-02-19 DIAGNOSIS — Z91040 Latex allergy status: Secondary | ICD-10-CM | POA: Insufficient documentation

## 2015-02-19 DIAGNOSIS — Z882 Allergy status to sulfonamides status: Secondary | ICD-10-CM | POA: Diagnosis not present

## 2015-02-19 DIAGNOSIS — Z1211 Encounter for screening for malignant neoplasm of colon: Secondary | ICD-10-CM | POA: Insufficient documentation

## 2015-02-19 DIAGNOSIS — Z881 Allergy status to other antibiotic agents status: Secondary | ICD-10-CM | POA: Insufficient documentation

## 2015-02-19 DIAGNOSIS — G4730 Sleep apnea, unspecified: Secondary | ICD-10-CM | POA: Insufficient documentation

## 2015-02-19 DIAGNOSIS — D123 Benign neoplasm of transverse colon: Secondary | ICD-10-CM | POA: Diagnosis not present

## 2015-02-19 DIAGNOSIS — Z79899 Other long term (current) drug therapy: Secondary | ICD-10-CM | POA: Insufficient documentation

## 2015-02-19 DIAGNOSIS — E039 Hypothyroidism, unspecified: Secondary | ICD-10-CM | POA: Diagnosis not present

## 2015-02-19 HISTORY — PX: COLONOSCOPY WITH PROPOFOL: SHX5780

## 2015-02-19 HISTORY — DX: Hypothyroidism, unspecified: E03.9

## 2015-02-19 HISTORY — DX: Sleep apnea, unspecified: G47.30

## 2015-02-19 SURGERY — COLONOSCOPY WITH PROPOFOL
Anesthesia: General

## 2015-02-19 MED ORDER — PROPOFOL 10 MG/ML IV BOLUS
INTRAVENOUS | Status: DC | PRN
  Administered 2015-02-19: 50 mg via INTRAVENOUS
  Administered 2015-02-19: 15 mg via INTRAVENOUS

## 2015-02-19 MED ORDER — PROPOFOL INFUSION 10 MG/ML OPTIME
INTRAVENOUS | Status: DC | PRN
  Administered 2015-02-19: 120 ug/kg/min via INTRAVENOUS

## 2015-02-19 MED ORDER — SODIUM CHLORIDE 0.9 % IV SOLN
INTRAVENOUS | Status: DC
  Administered 2015-02-19 (×2): via INTRAVENOUS

## 2015-02-19 MED ORDER — LIDOCAINE HCL (PF) 1 % IJ SOLN
INTRAMUSCULAR | Status: AC
  Filled 2015-02-19: qty 2

## 2015-02-19 MED ORDER — SODIUM CHLORIDE 0.9 % IV SOLN: INTRAVENOUS | Status: DC

## 2015-02-19 MED ORDER — PHENYLEPHRINE HCL 10 MG/ML IJ SOLN
INTRAMUSCULAR | Status: DC | PRN
  Administered 2015-02-19: 50 ug via INTRAVENOUS

## 2015-02-19 MED ORDER — FENTANYL CITRATE (PF) 100 MCG/2ML IJ SOLN
INTRAMUSCULAR | Status: DC | PRN
  Administered 2015-02-19: 50 ug via INTRAVENOUS

## 2015-02-19 MED ORDER — MIDAZOLAM HCL 5 MG/5ML IJ SOLN
INTRAMUSCULAR | Status: DC | PRN
  Administered 2015-02-19: 1 mg via INTRAVENOUS

## 2015-02-19 NOTE — Transfer of Care (Signed)
Immediate Anesthesia Transfer of Care Note  Patient: Sherry Oliver  Procedure(s) Performed: Procedure(s): COLONOSCOPY WITH PROPOFOL (N/A)  Patient Location: PACU  Anesthesia Type:General  Level of Consciousness: sedated  Airway & Oxygen Therapy: Patient Spontanous Breathing and Patient connected to nasal cannula oxygen  Post-op Assessment: Report given to RN and Post -op Vital signs reviewed and stable  Post vital signs: Reviewed and stable  Last Vitals:  Filed Vitals:   02/19/15 1542  BP: 92/45  Pulse: 77  Temp: 36.1 C  Resp: 15    Complications: No apparent anesthesia complications

## 2015-02-19 NOTE — Anesthesia Preprocedure Evaluation (Signed)
Anesthesia Evaluation  Patient identified by MRN, date of birth, ID band Patient awake    Reviewed: Allergy & Precautions  History of Anesthesia Complications (+) PONV  Airway Mallampati: II   Neck ROM: Full    Dental   Pulmonary sleep apnea and Continuous Positive Airway Pressure Ventilation , Current Smoker (1 ppd),          Cardiovascular     Neuro/Psych    GI/Hepatic GERD-  Medicated and Controlled,  Endo/Other  Hypothyroidism   Renal/GU      Musculoskeletal   Abdominal   Peds  Hematology   Anesthesia Other Findings   Reproductive/Obstetrics                             Anesthesia Physical Anesthesia Plan  ASA: II  Anesthesia Plan: General   Post-op Pain Management:    Induction: Intravenous  Airway Management Planned: Nasal Cannula  Additional Equipment:   Intra-op Plan:   Post-operative Plan:   Informed Consent: I have reviewed the patients History and Physical, chart, labs and discussed the procedure including the risks, benefits and alternatives for the proposed anesthesia with the patient or authorized representative who has indicated his/her understanding and acceptance.     Plan Discussed with:   Anesthesia Plan Comments:         Anesthesia Quick Evaluation

## 2015-02-19 NOTE — H&P (Signed)
Outpatient short stay form Pre-procedure 02/19/2015 2:59 PM Lollie Sails MD  Primary Physician: Dr. Hortencia Pilar  Reason for visit:  Screening colonoscopy  History of present illness:  Patient is a 54 year old female sending for screening colonoscopy. There is family history of colon polyps in her mother. He tolerated her prep well. She is not on any anticoagulates or aspirin's.    Current facility-administered medications:  .  0.9 %  sodium chloride infusion, , Intravenous, Continuous, Lollie Sails, MD, Last Rate: 50 mL/hr at 02/19/15 1455 .  0.9 %  sodium chloride infusion, , Intravenous, Continuous, Lollie Sails, MD .  lidocaine (PF) (XYLOCAINE) 1 % injection, , , ,   Prescriptions prior to admission  Medication Sig Dispense Refill Last Dose  . Ascorbic Acid (VITAMIN C) 1000 MG tablet Take 1,000 mg by mouth daily.   Past Week at Unknown time  . b complex vitamins tablet Take 1 tablet by mouth daily.   Past Week at Unknown time  . Calcium Carbonate-Vitamin D 600-400 MG-UNIT per tablet Take 1 tablet by mouth daily.   Past Week at Unknown time  . esomeprazole (NEXIUM) 40 MG capsule Take 40 mg by mouth daily at 12 noon.   02/18/2015 at Unknown time  . fluticasone (FLONASE) 50 MCG/ACT nasal spray Place 2 sprays into both nostrils daily.   02/18/2015 at Unknown time  . ibuprofen (ADVIL,MOTRIN) 200 MG tablet Take 400 mg by mouth 2 (two) times daily.   02/18/2015 at Unknown time  . levothyroxine (SYNTHROID, LEVOTHROID) 88 MCG tablet Take 88 mcg by mouth daily before breakfast.   02/19/2015 at 0800  . liothyronine (CYTOMEL) 5 MCG tablet Take 5 mcg by mouth daily.   02/19/2015 at 0800  . Multiple Vitamin (MULTIVITAMIN WITH MINERALS) TABS tablet Take 1 tablet by mouth daily.   Past Week at Unknown time  . clobetasol ointment (TEMOVATE) 6.37 % Apply 1 application topically 2 (two) times daily as needed (rash).   Not Taking at Unknown time     Allergies  Allergen Reactions  .  Chlorzoxazone Nausea And Vomiting  . Crestor [Rosuvastatin]     Tired and depressed  . Latex Swelling  . Sulfur Other (See Comments)    Hyperactive   . Benzocaine Rash  . Cobalt Rash  . Dibucaine Rash  . Formaldehyde Rash  . Nickel Sulfate [Nickel] Rash  . Penicillins Rash  . Potassium Dichromate Rash  . Tetracaine Rash     Past Medical History  Diagnosis Date  . Hypothyroid   . Sleep apnea     Review of systems:      Physical Exam    Heart and lungs: Regular rate and rhythm without rub or gallop lungs bilaterally clear    HEENT: Normocephalic atraumatic eyes are anicteric    Other:     Pertinant exam for procedure: Soft nontender nondistended bowel sounds positive normoactive    Planned proceedures: Colonoscopy with indicated procedures. I have discussed the risks benefits and complications of procedures to include not limited to bleeding, infection, perforation and the risk of sedation and the patient wishes to proceed.    Lollie Sails, MD Gastroenterology 02/19/2015  2:59 PM

## 2015-02-19 NOTE — Op Note (Signed)
[facility] Gastroenterology Patient Name: Sherry Oliver Procedure Date: 02/19/2015 2:39 PM MRN: [id number] Account #: [id number] Date of Birth: 24-Aug-1961 Admit Type: Outpatient Age: 54 Room: [facility] ENDO ROOM 3 Gender: Female Note Status: Finalized Procedure:         Colonoscopy Indications:       Family history of colonic polyps in a first-degree relative Providers:         Lollie Sails, MD Referring MD:      Kerin Perna, MD (Referring MD) Medicines:         Monitored Anesthesia Care Complications:     No immediate complications. Procedure:         Pre-Anesthesia Assessment:                    - ASA Grade Assessment: II - A patient with mild systemic                     disease.                    After obtaining informed consent, the colonoscope was                     passed under direct vision. Throughout the procedure, the                     patient's blood pressure, pulse, and oxygen saturations                     were monitored continuously. The Colonoscope was                     introduced through the anus and advanced to the the cecum,                     identified by appendiceal orifice and ileocecal valve. The                     patient tolerated the procedure well. The quality of the                     bowel preparation was good. Findings:      A few small-mouthed diverticula were found in the sigmoid colon.      A 12 mm polyp was found at the hepatic flexure. The polyp was sessile.       The polyp was removed with a cold biopsy forceps. The polyp was removed       with a cold snare. The polyp was removed with a lift and cut technique       using a cold snare. Resection and retrieval were complete.      Two sessile polyps were found in the proximal sigmoid colon and in the       mid sigmoid colon. The polyps were 2 to 3 mm in size. These polyps were       removed with a cold biopsy forceps. Resection and retrieval were   complete.      A 2 mm polyp was found in the rectum. The polyp was sessile. The polyp       was removed with a cold biopsy forceps. Resection and retrieval were       complete.      Non-bleeding internal hemorrhoids were found during anoscopy. The  hemorrhoids were small.      The digital rectal exam was normal. Impression:        - Diverticulosis in the sigmoid colon.                    - One 12 mm polyp at the hepatic flexure. Resected and                     retrieved.                    - Two 2 to 3 mm polyps in the proximal sigmoid colon and                     in the mid sigmoid colon. Resected and retrieved.                    - One 2 mm polyp in the rectum. Resected and retrieved.                    - Non-bleeding internal hemorrhoids. Recommendation:    - Await pathology results.                    - Telephone GI clinic for pathology results. Procedure Code(s): --- Professional ---                    [id number], Colonoscopy, flexible; with removal of tumor(s),                     polyp(s), or other lesion(s) by snare technique                    45380, 57, Colonoscopy, flexible; with biopsy, single or                     multiple Diagnosis Code(s): --- Professional ---                    211.3, Benign neoplasm of colon                    569.0, Anal and rectal polyp                    455.0, Internal hemorrhoids without mention of complication                    V18.51, Family history of colonic polyps                    562.10, Diverticulosis of colon (without mention of                     hemorrhage) CPT copyright 2014 [employer]. All rights reserved. The codes documented in this report are preliminary and upon coder review may  be revised to meet current compliance requirements. Lollie Sails, MD 02/19/2015 3:42:19 PM This report has been signed electronically. Number of Addenda: 0 Note Initiated On: 02/19/2015 2:39 PM Scope Withdrawal Time: 0 hours  17 minutes 14 seconds  Total Procedure Duration: 0 hours 27 minutes 29 seconds       [facility]

## 2015-02-21 ENCOUNTER — Encounter: Payer: Self-pay | Admitting: Gastroenterology

## 2015-02-21 LAB — SURGICAL PATHOLOGY

## 2015-02-25 NOTE — Anesthesia Postprocedure Evaluation (Signed)
  Anesthesia Post-op Note  Patient: Sherry Oliver  Procedure(s) Performed: Procedure(s): COLONOSCOPY WITH PROPOFOL (N/A)  Anesthesia type:General  Patient location: PACU  Post pain: Pain level controlled  Post assessment: Post-op Vital signs reviewed, Patient's Cardiovascular Status Stable, Respiratory Function Stable, Patent Airway and No signs of Nausea or vomiting  Post vital signs: Reviewed and stable  Last Vitals:  Filed Vitals:   02/19/15 1610  BP: 157/93  Pulse: 80  Temp:   Resp: 14    Level of consciousness: awake, alert  and patient cooperative  Complications: No apparent anesthesia complications

## 2015-02-25 NOTE — Addendum Note (Signed)
Addendum  created 02/25/15 0750 by Molli Barrows, MD   Modules edited: Anesthesia Review and Sign Navigator Section

## 2015-04-22 ENCOUNTER — Other Ambulatory Visit: Payer: Self-pay | Admitting: Family Medicine

## 2015-04-22 DIAGNOSIS — Z1231 Encounter for screening mammogram for malignant neoplasm of breast: Secondary | ICD-10-CM

## 2015-05-03 ENCOUNTER — Ambulatory Visit
Admission: RE | Admit: 2015-05-03 | Discharge: 2015-05-03 | Disposition: A | Discharge status: Home / Self Care | Payer: BLUE CROSS/BLUE SHIELD | Source: Ambulatory Visit | Attending: Family Medicine | Admitting: Family Medicine

## 2015-05-03 DIAGNOSIS — Z1231 Encounter for screening mammogram for malignant neoplasm of breast: Secondary | ICD-10-CM

## 2015-05-03 IMAGING — MG MM DIGITAL SCREENING BILAT W/ CAD
4 series · 4 of 4 positions shown · non-contrast
Comparison: Previous exam(s).

CLINICAL DATA: Screening.

EXAM:
DIGITAL SCREENING BILATERAL MAMMOGRAM WITH CAD

[R CC]
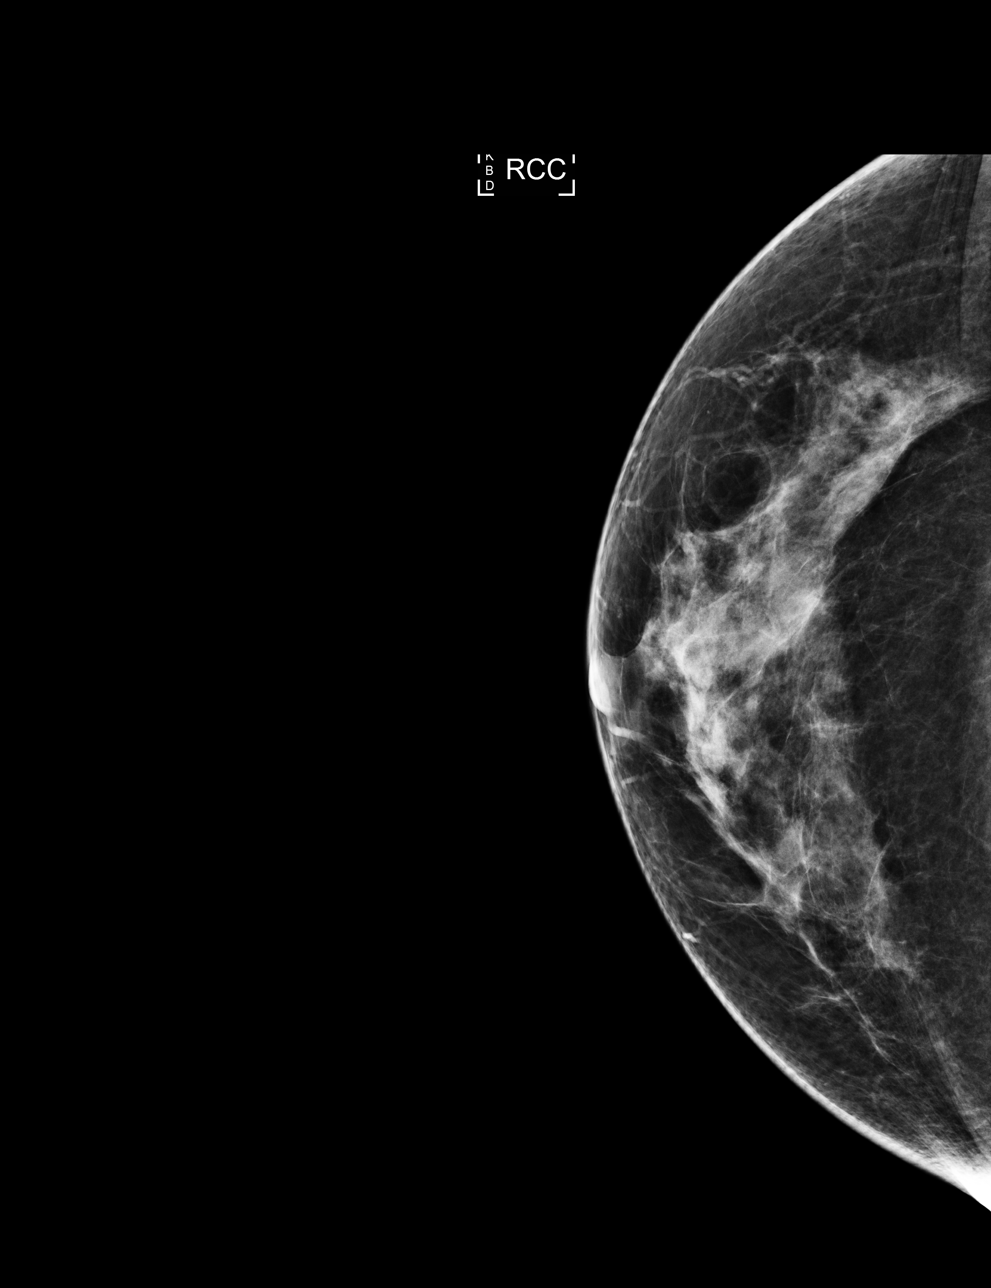

[R MLO]
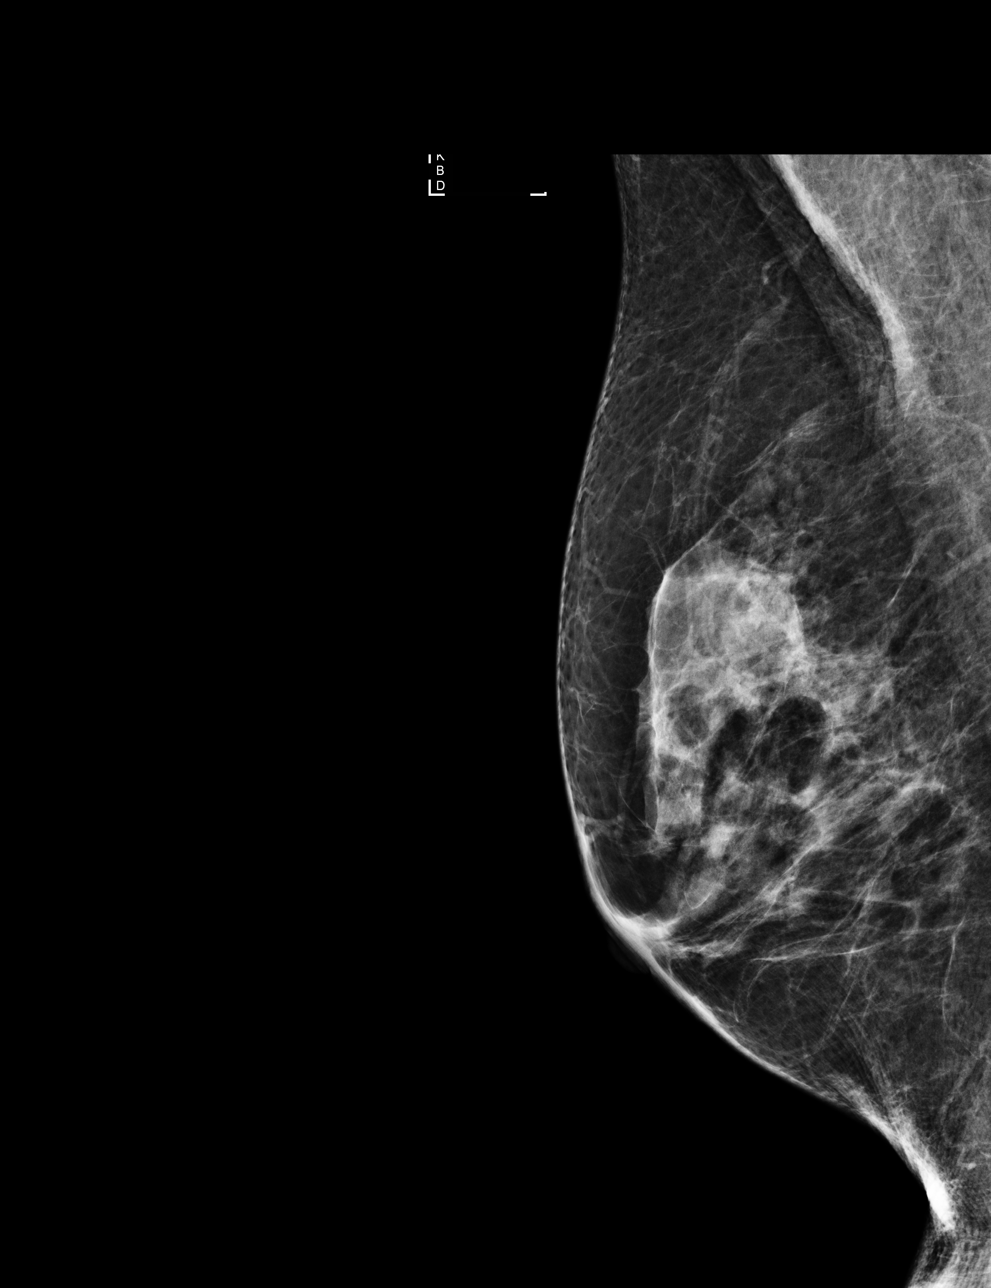

[L CC]
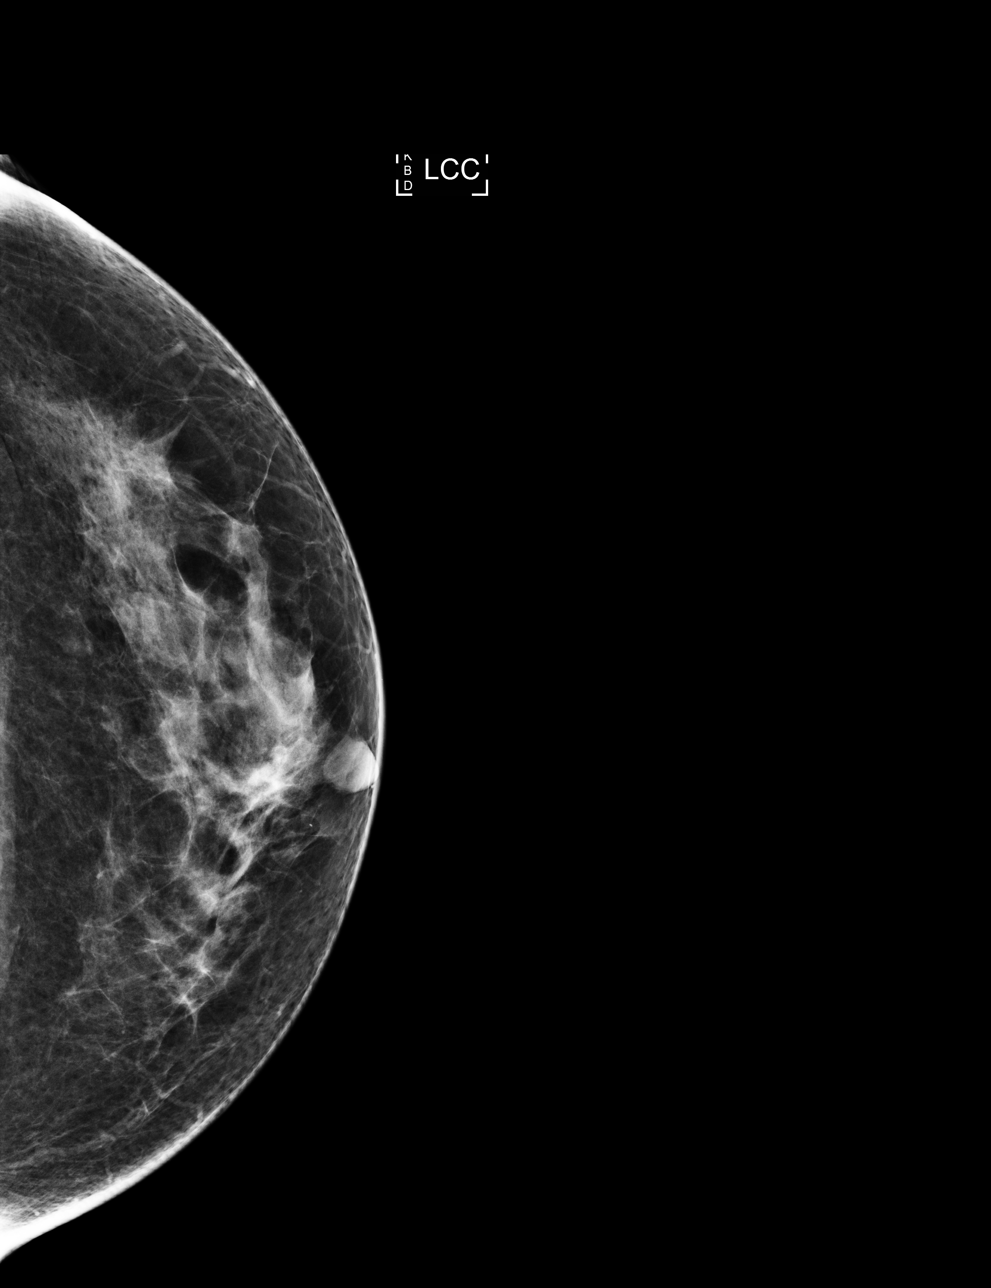

[L MLO]
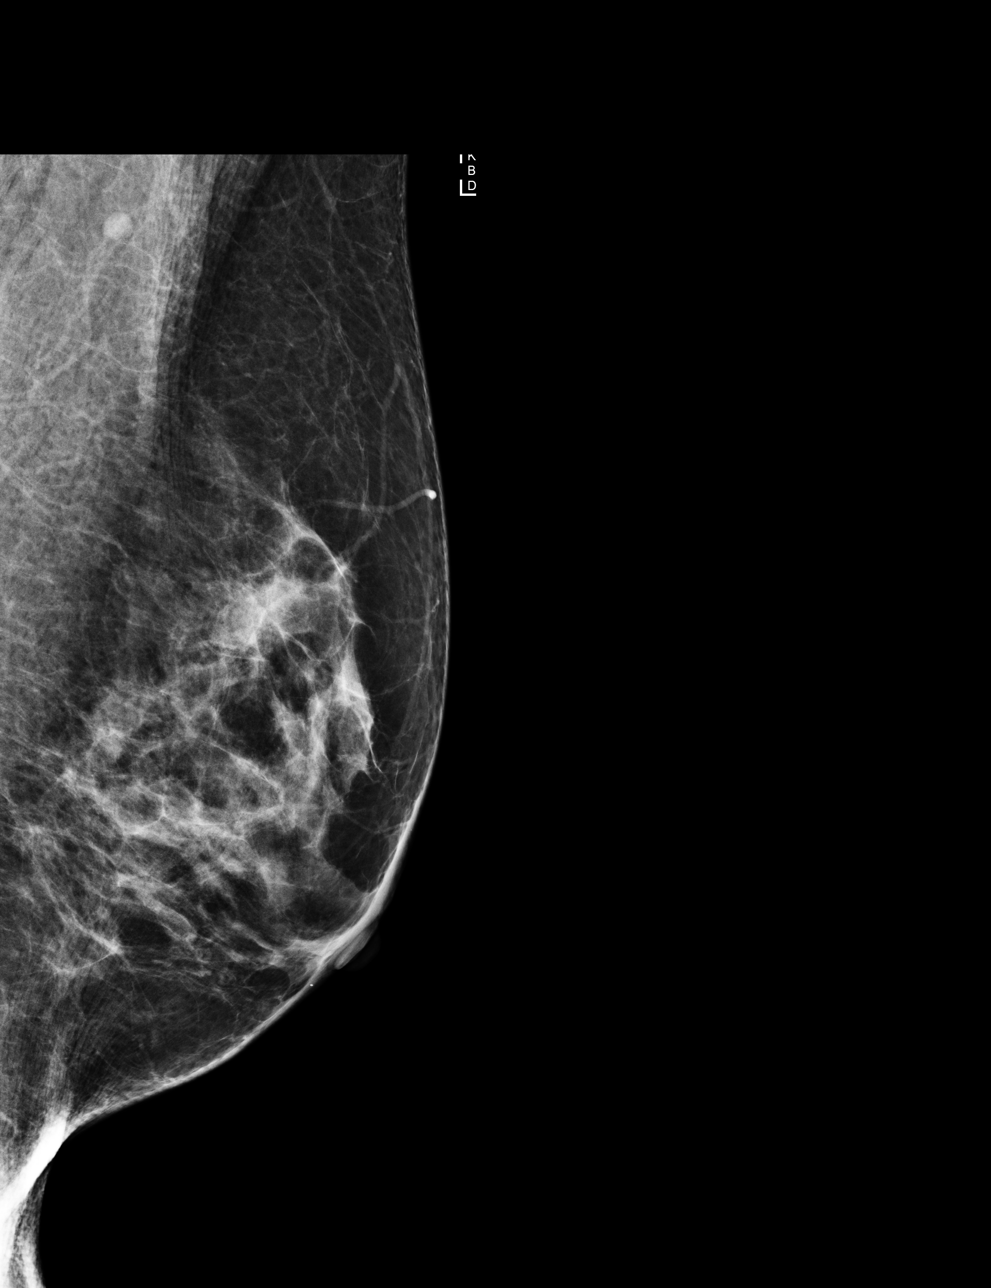

[4 of 4 positions shown; findings below may reference images not displayed]

ACR Breast Density Category c: The breast tissue is heterogeneously
dense, which may obscure small masses.
FINDINGS: There are no findings suspicious for malignancy. Images were
processed with CAD.
IMPRESSION: No mammographic evidence of malignancy. A result letter of this
screening mammogram will be mailed directly to the patient.

RECOMMENDATION:
Screening mammogram in one year. (Code:[0J])

BI-RADS CATEGORY  1: Negative.

## 2016-06-16 ENCOUNTER — Other Ambulatory Visit: Payer: Self-pay | Admitting: Family Medicine

## 2016-06-16 DIAGNOSIS — Z1231 Encounter for screening mammogram for malignant neoplasm of breast: Secondary | ICD-10-CM

## 2016-06-25 ENCOUNTER — Ambulatory Visit
Admission: RE | Admit: 2016-06-25 | Discharge: 2016-06-25 | Disposition: A | Discharge status: Home / Self Care | Payer: BLUE CROSS/BLUE SHIELD | Source: Ambulatory Visit | Attending: Family Medicine | Admitting: Family Medicine

## 2016-06-25 DIAGNOSIS — Z1231 Encounter for screening mammogram for malignant neoplasm of breast: Secondary | ICD-10-CM | POA: Diagnosis present

## 2016-06-25 IMAGING — MG MM DIGITAL SCREENING BILAT W/ CAD
5 series · 5 of 5 positions shown · non-contrast
Comparison: Previous exam(s).

CLINICAL DATA: Screening.

EXAM:
DIGITAL SCREENING BILATERAL MAMMOGRAM WITH CAD

[R MLO]
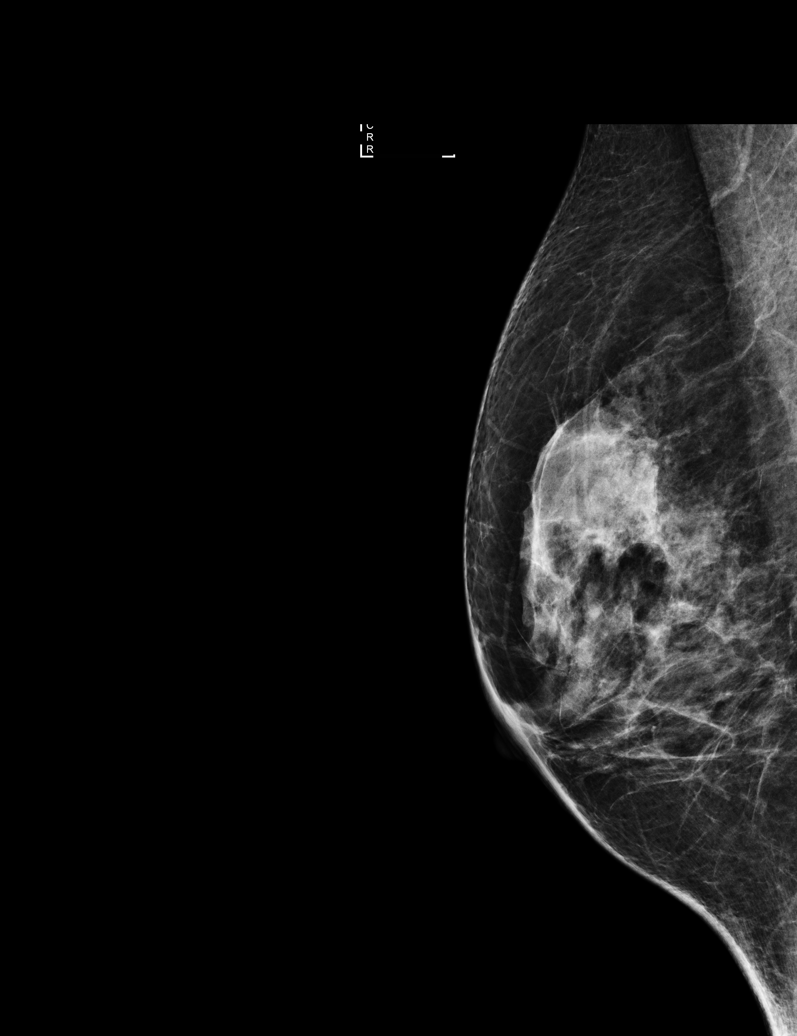

[L CC (1 of 2)]
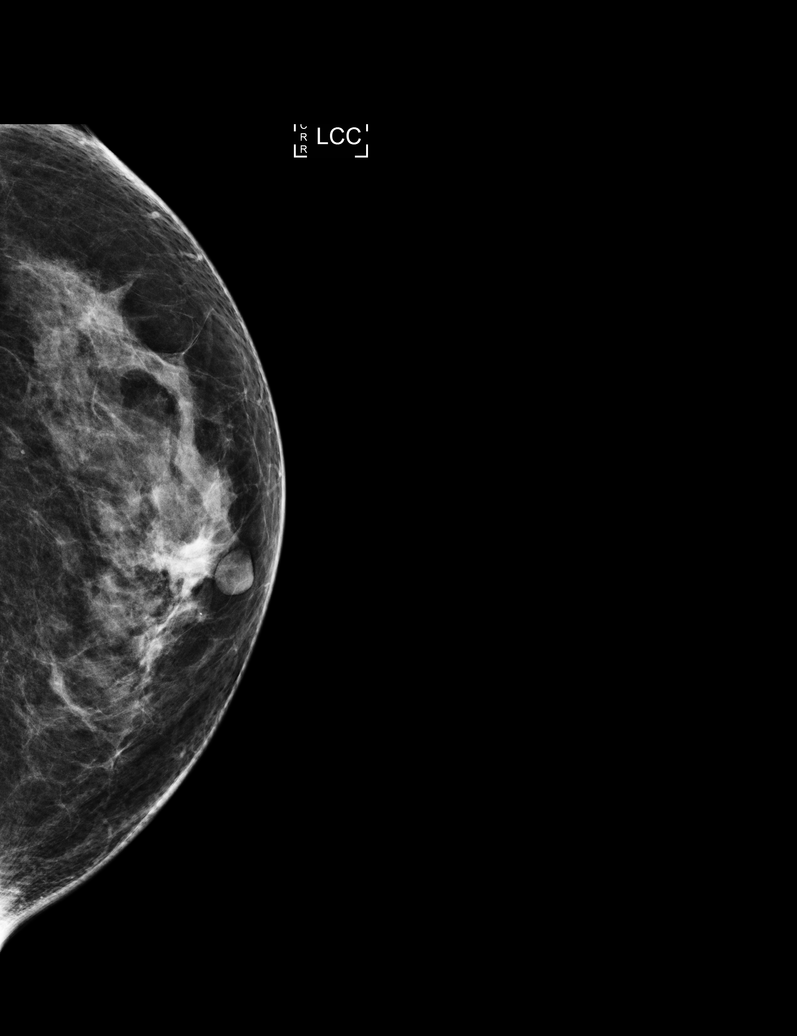

[R CC]
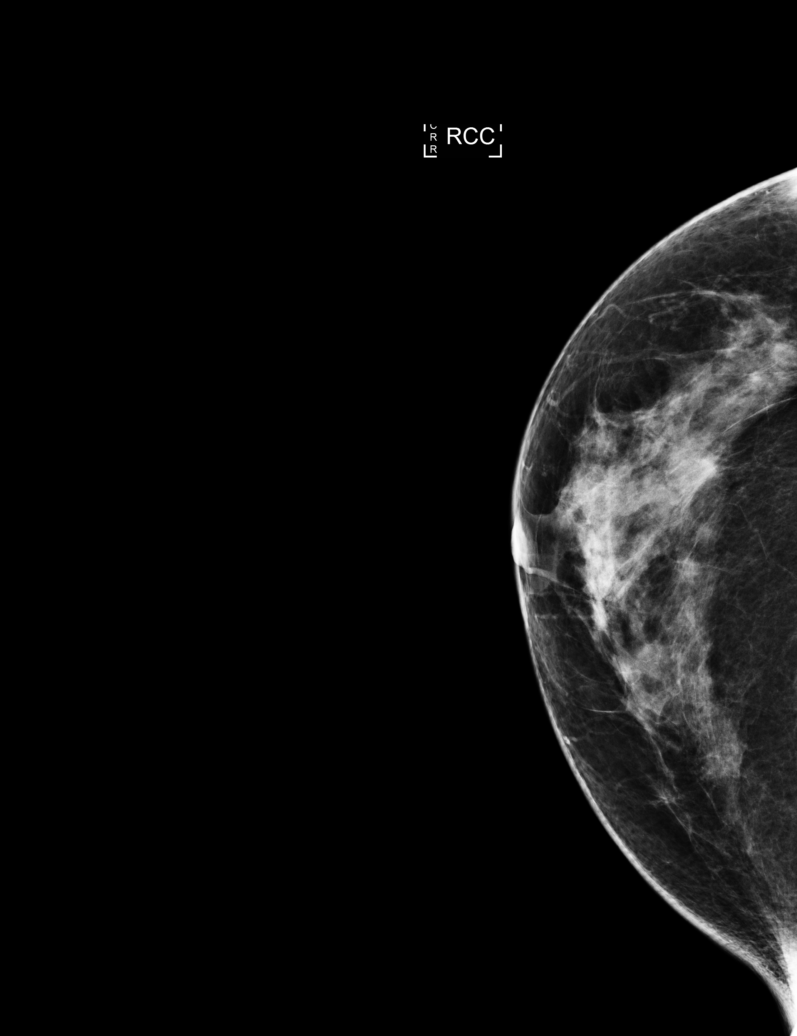

[L CC (2 of 2)]
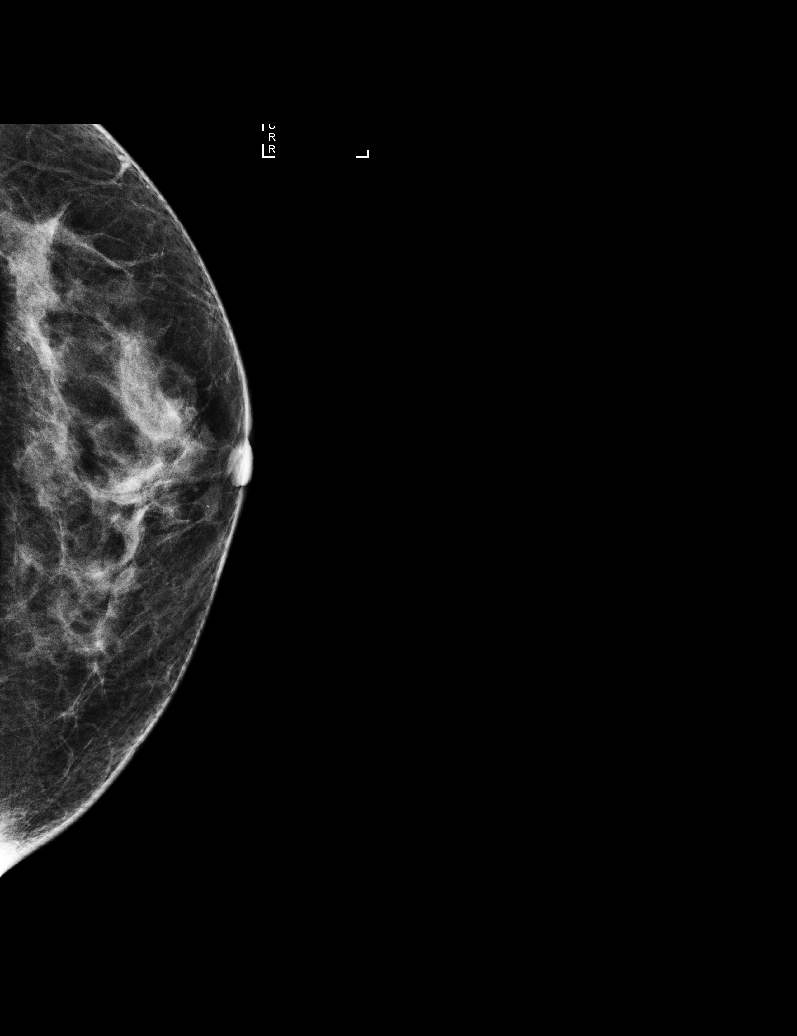

[L MLO]
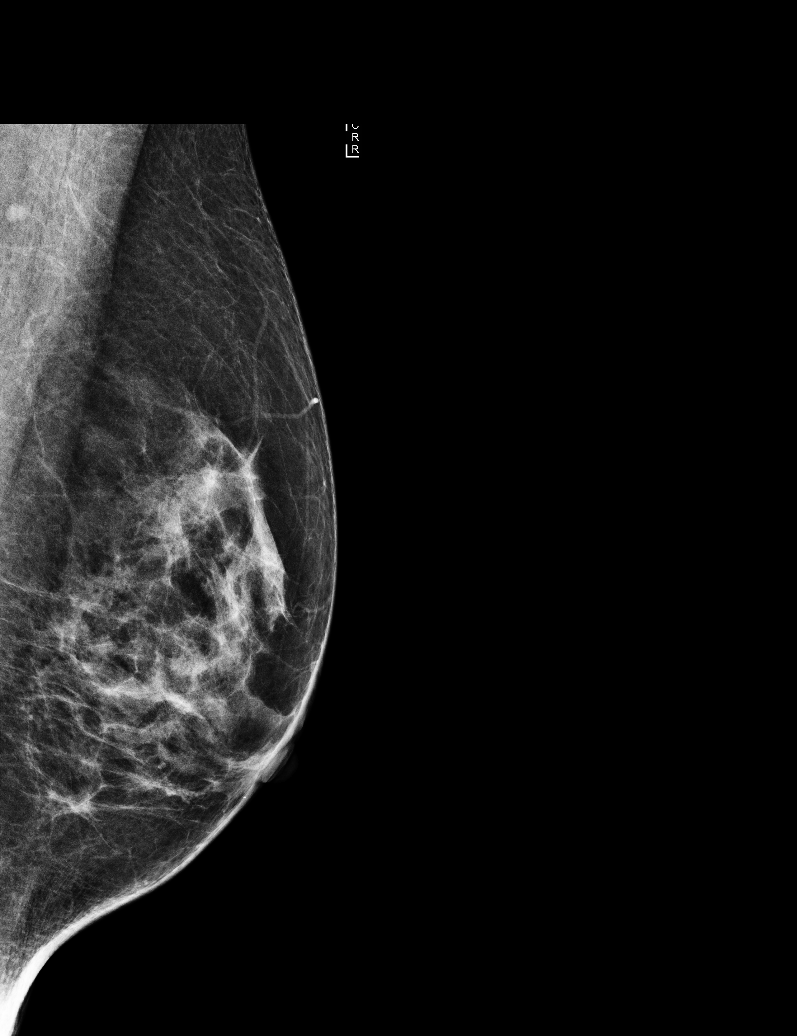

[5 of 5 positions shown; findings below may reference images not displayed]

ACR Breast Density Category c: The breast tissue is heterogeneously
dense, which may obscure small masses.
FINDINGS: There are no findings suspicious for malignancy. Images were
processed with CAD.
IMPRESSION: No mammographic evidence of malignancy. A result letter of this
screening mammogram will be mailed directly to the patient.

RECOMMENDATION:
Screening mammogram in one year. (Code:[0J])

BI-RADS CATEGORY  1: Negative.

## 2016-10-19 ENCOUNTER — Other Ambulatory Visit: Payer: Self-pay | Admitting: Family Medicine

## 2016-10-19 DIAGNOSIS — N644 Mastodynia: Secondary | ICD-10-CM

## 2016-11-09 ENCOUNTER — Ambulatory Visit
Admission: RE | Admit: 2016-11-09 | Discharge: 2016-11-09 | Disposition: A | Discharge status: Home / Self Care | Payer: BLUE CROSS/BLUE SHIELD | Source: Ambulatory Visit | Attending: Family Medicine | Admitting: Family Medicine

## 2016-11-09 DIAGNOSIS — N6322 Unspecified lump in the left breast, upper inner quadrant: Secondary | ICD-10-CM | POA: Insufficient documentation

## 2016-11-09 DIAGNOSIS — N644 Mastodynia: Secondary | ICD-10-CM | POA: Diagnosis present

## 2016-11-09 IMAGING — US US BREAST*L* LIMITED INC AXILLA
1 series · 4 of 4 positions shown · non-contrast
Comparison: Mammography [DATE], [DATE] and earlier.

CLINICAL DATA: 55-year-old presenting with an approximate 1 month
history of a palpable tender lump in the upper inner left breast.
Patient states that the lump is no longer palpable and that the pain
and tenderness has improved.

Strong family history of breast cancer in her mother (diagnosed at
age 60), a maternal aunt (diagnosed at age 45) and her maternal
grandfather.
EXAM:
2D DIGITAL DIAGNOSTIC LEFT MAMMOGRAM WITH CAD AND ADJUNCT TOMO
ULTRASOUND LEFT BREAST

[Series 1: us breast*left* limited inc axilla · 0.07mm/px · 4 of 4 slices shown]
[im 1/4]
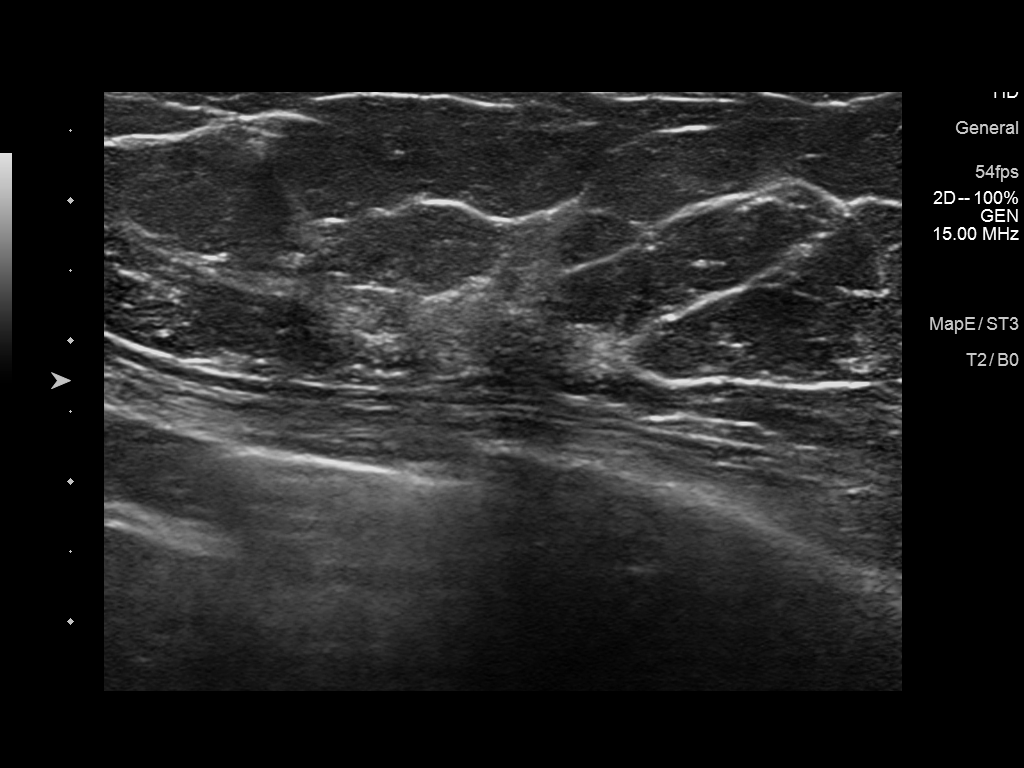
[im 2/4]
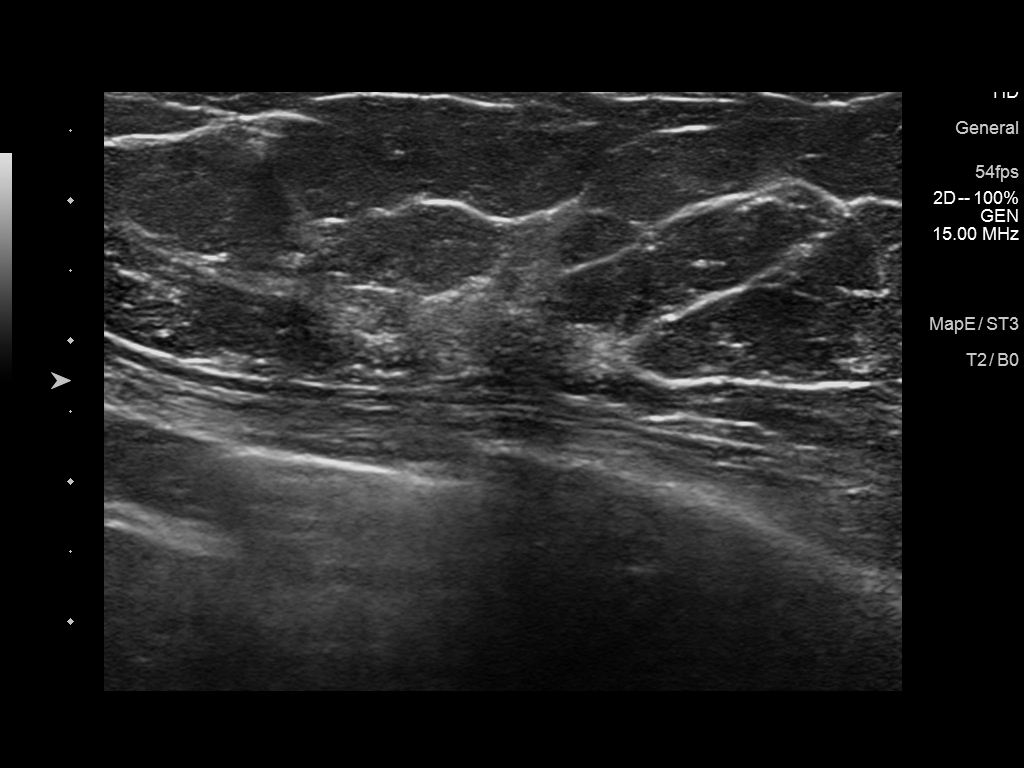
[im 3/4]
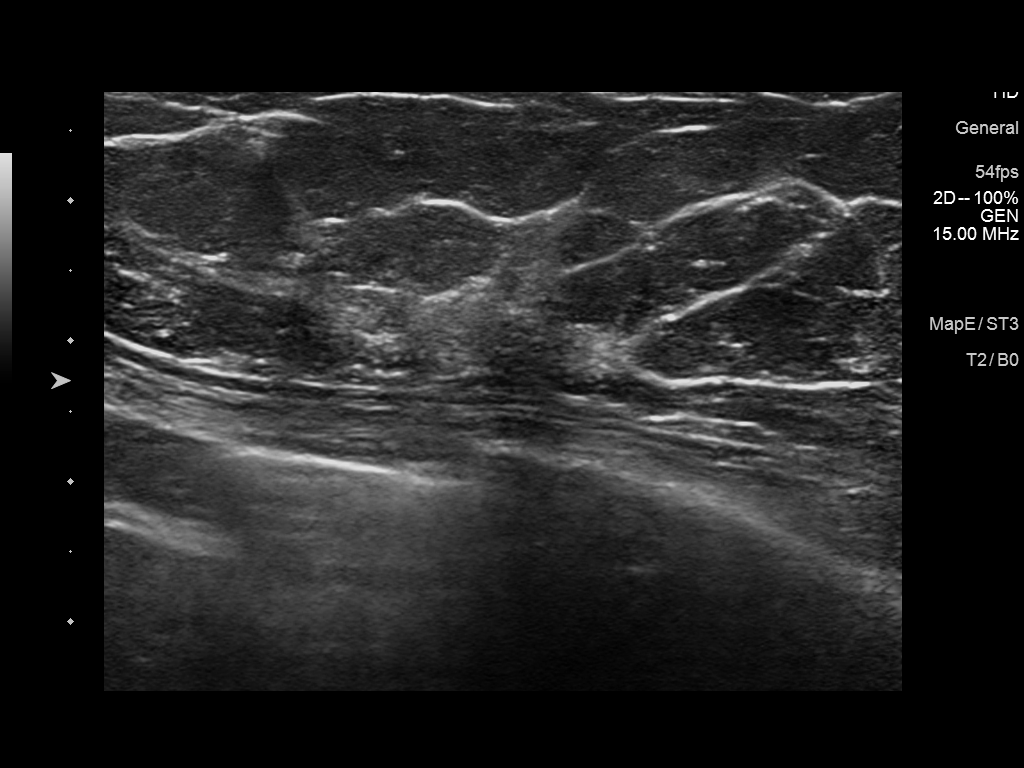
[im 4/4]
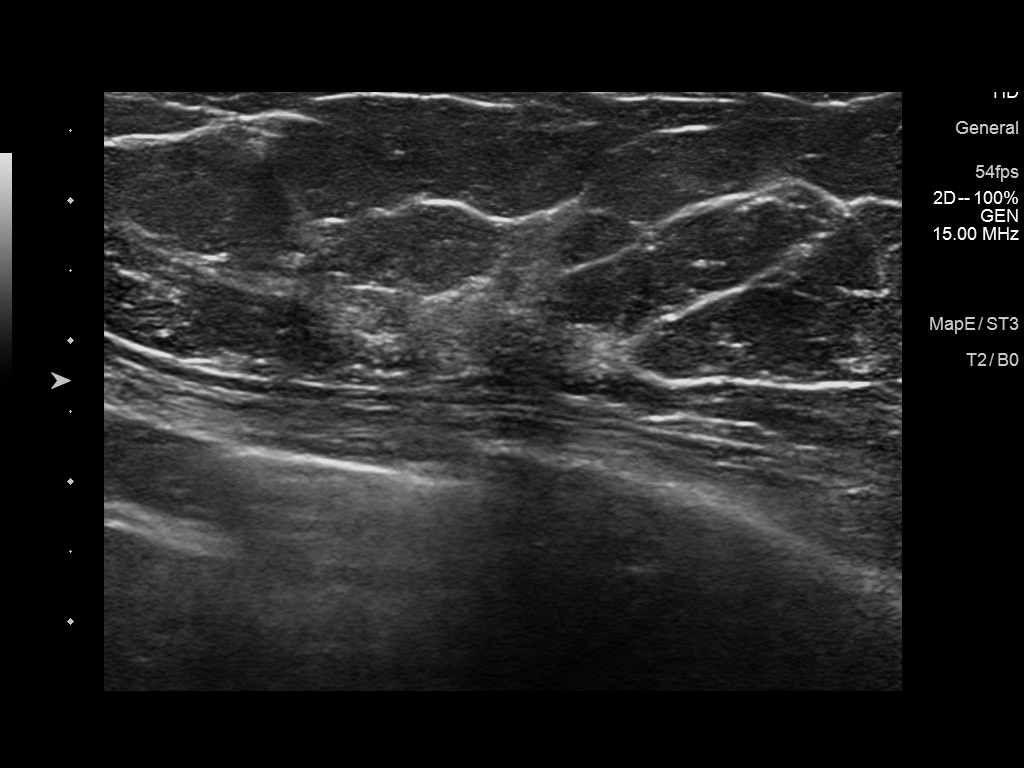

[4 of 4 positions shown; findings below may reference images not displayed]

No
prior left breast ultrasound.

ACR Breast Density Category c: The breast tissue is heterogeneously
dense, which may obscure small masses.
FINDINGS: Standard 2D and tomosynthesis full field CC and MLO views of the
left breast were obtained. A standard and tomosynthesis spot
tangential view of the area of palpable concern and focal tenderness
in the left breast was also obtained.

No findings suspicious for malignancy in the left breast.
Specifically, no mammographic abnormalities in the area of palpable
concern and focal pain in the upper inner left breast.

Mammographic images were processed with CAD.

On physical exam, the patient describes tenderness to palpation in
the upper inner left breast, though I do not palpate a discrete
mass.

Targeted left breast ultrasound is performed, showing normal
fibrofatty and fibroglandular tissue in the area of palpable concern
and focal tenderness at the 10 o'clock position approximately 4 cm
from the nipple. No cyst, solid mass or abnormal acoustic shadowing
is identified.
IMPRESSION: No mammographic or sonographic evidence of malignancy, left breast.

RECOMMENDATION:
Annual bilateral screening mammography which is due in [DATE].

I have discussed the findings and recommendations with the patient.
Results were also provided in writing at the conclusion of the
visit. If applicable, a reminder letter will be sent to the patient
regarding the next appointment.

BI-RADS CATEGORY  1: Negative.

## 2016-11-09 IMAGING — MG MM DIGITAL DIAGNOSTIC UNILAT*L* W/ TOMO W/ CAD
6 of 9 series · 6 of 21 positions shown · non-contrast
Comparison: Mammography [DATE], [DATE] and earlier.

CLINICAL DATA: 55-year-old presenting with an approximate 1 month
history of a palpable tender lump in the upper inner left breast.
Patient states that the lump is no longer palpable and that the pain
and tenderness has improved.

Strong family history of breast cancer in her mother (diagnosed at
age 60), a maternal aunt (diagnosed at age 45) and her maternal
grandfather.
EXAM:
2D DIGITAL DIAGNOSTIC LEFT MAMMOGRAM WITH CAD AND ADJUNCT TOMO
ULTRASOUND LEFT BREAST

[L CC (1 of 2)]
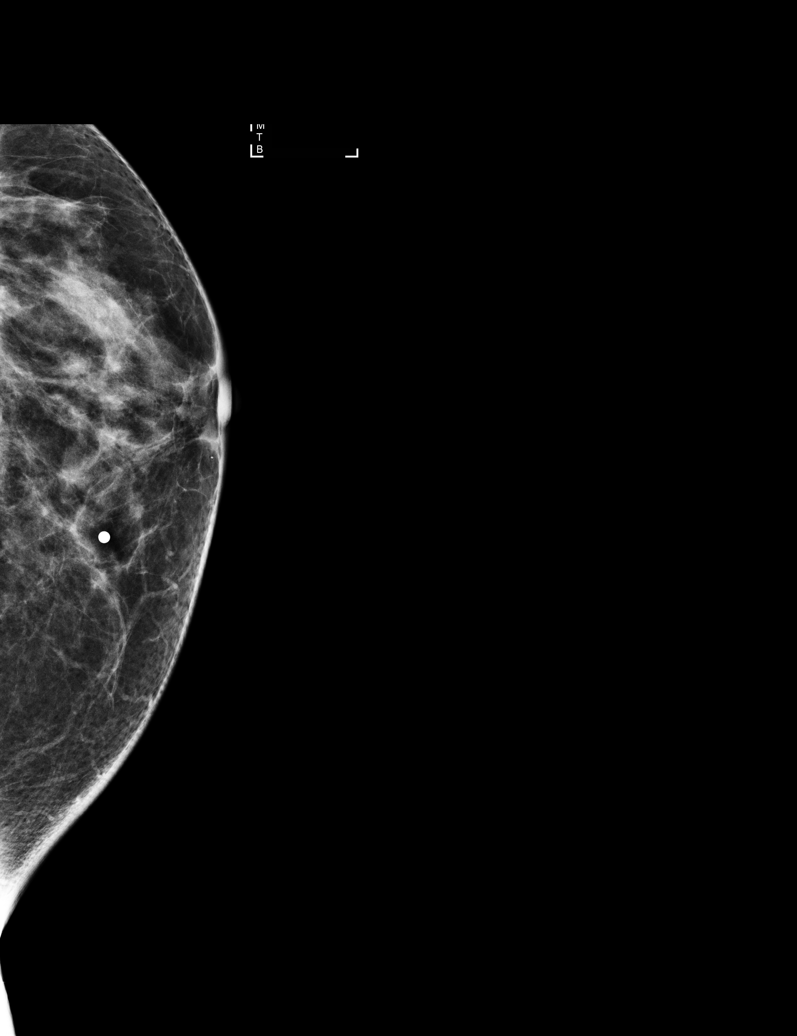

[L MLO synth-2D]
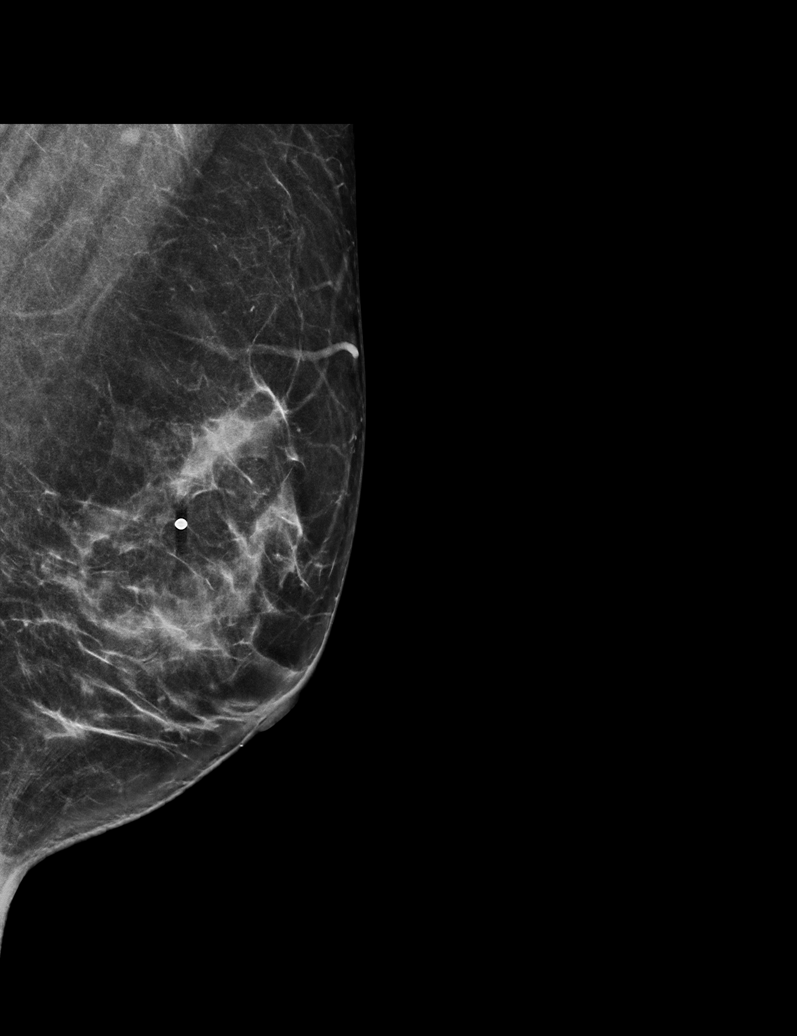

[L CC (2 of 2)]
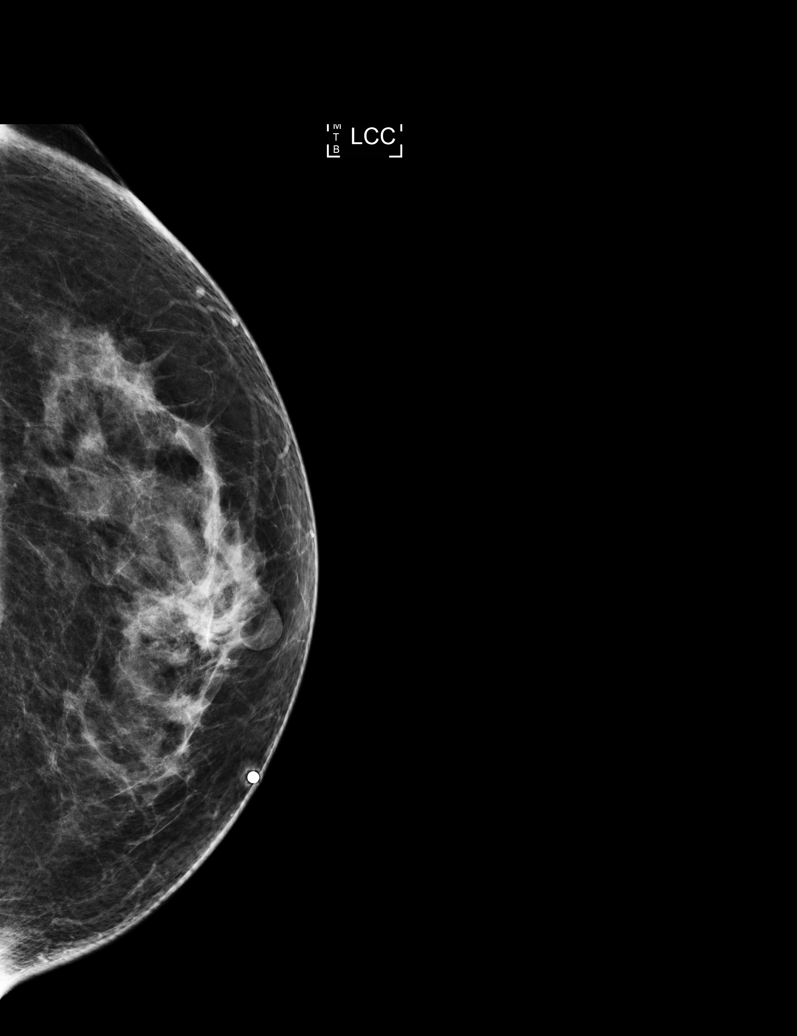

[L TAN]
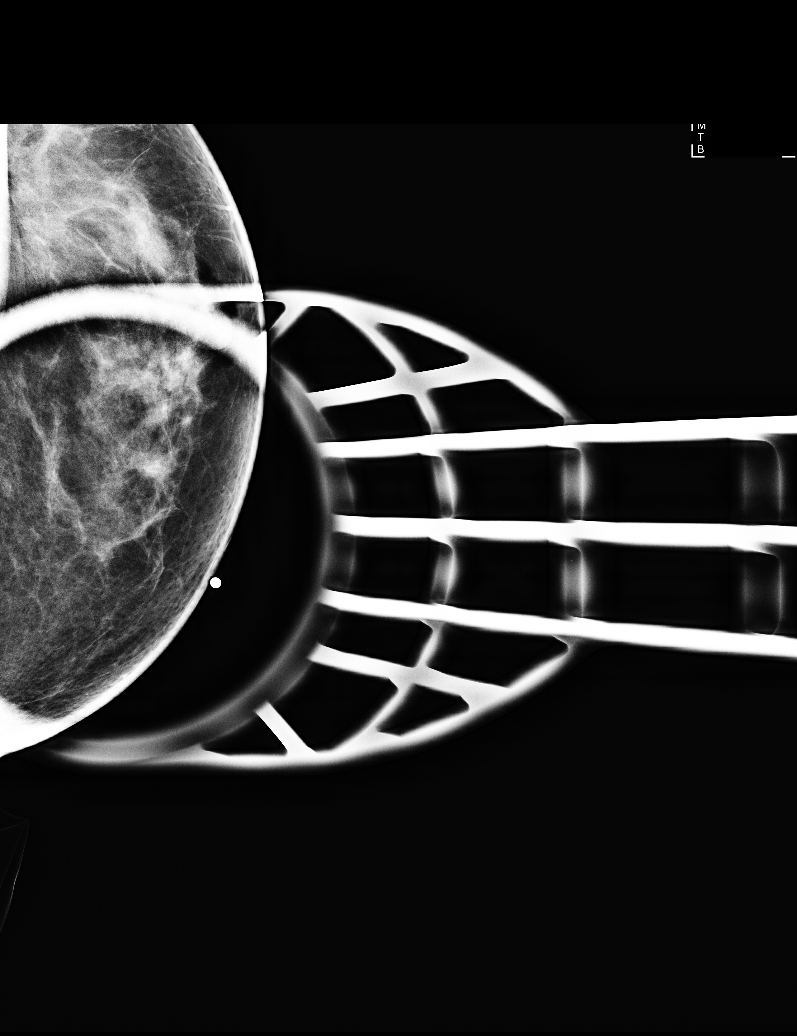

[L MLO]
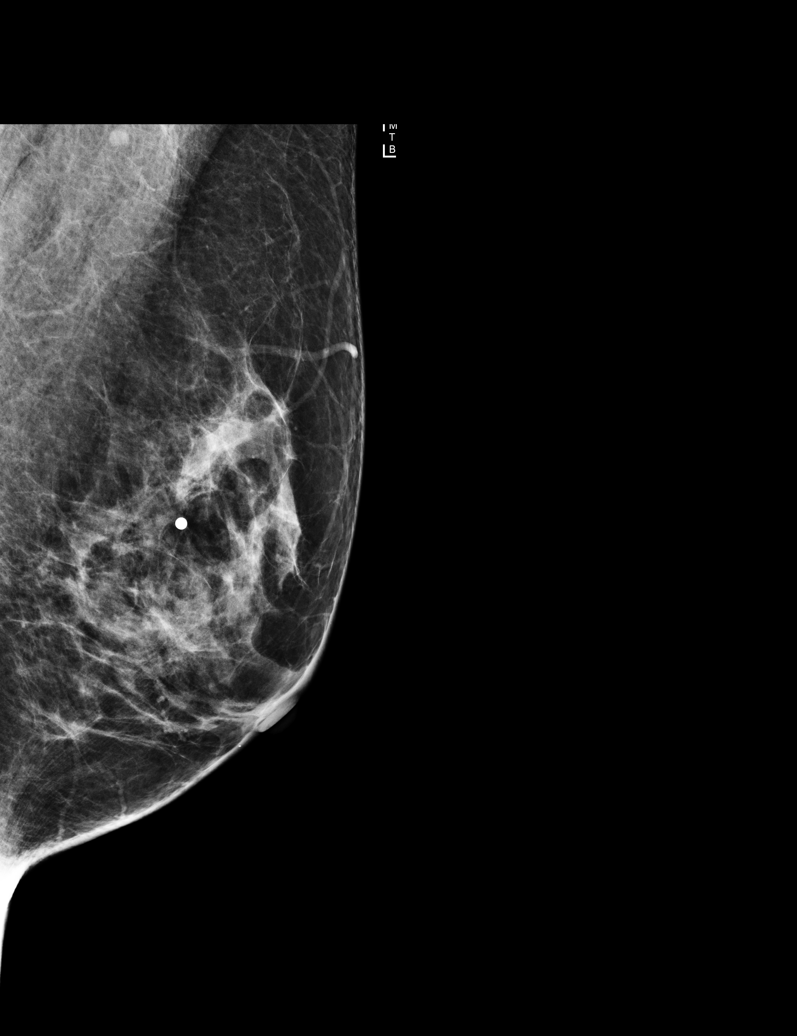

[L CC synth-2D]
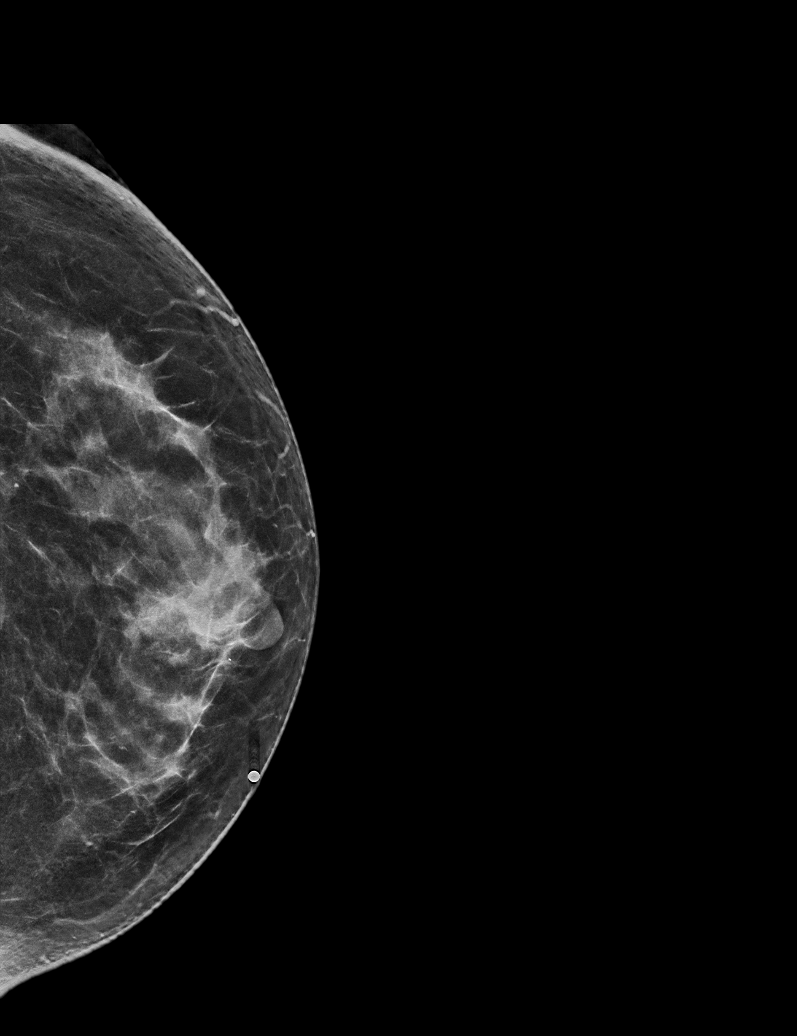

[6 of 21 positions shown; findings below may reference images not displayed]

No
prior left breast ultrasound.

ACR Breast Density Category c: The breast tissue is heterogeneously
dense, which may obscure small masses.
FINDINGS: Standard 2D and tomosynthesis full field CC and MLO views of the
left breast were obtained. A standard and tomosynthesis spot
tangential view of the area of palpable concern and focal tenderness
in the left breast was also obtained.

No findings suspicious for malignancy in the left breast.
Specifically, no mammographic abnormalities in the area of palpable
concern and focal pain in the upper inner left breast.

Mammographic images were processed with CAD.

On physical exam, the patient describes tenderness to palpation in
the upper inner left breast, though I do not palpate a discrete
mass.

Targeted left breast ultrasound is performed, showing normal
fibrofatty and fibroglandular tissue in the area of palpable concern
and focal tenderness at the 10 o'clock position approximately 4 cm
from the nipple. No cyst, solid mass or abnormal acoustic shadowing
is identified.
IMPRESSION: No mammographic or sonographic evidence of malignancy, left breast.

RECOMMENDATION:
Annual bilateral screening mammography which is due in [DATE].

I have discussed the findings and recommendations with the patient.
Results were also provided in writing at the conclusion of the
visit. If applicable, a reminder letter will be sent to the patient
regarding the next appointment.

BI-RADS CATEGORY  1: Negative.

## 2017-07-26 ENCOUNTER — Other Ambulatory Visit: Payer: Self-pay | Admitting: Family Medicine

## 2017-07-26 DIAGNOSIS — Z1231 Encounter for screening mammogram for malignant neoplasm of breast: Secondary | ICD-10-CM

## 2017-08-20 ENCOUNTER — Ambulatory Visit
Admission: RE | Admit: 2017-08-20 | Discharge: 2017-08-20 | Disposition: A | Discharge status: Home / Self Care | Payer: BLUE CROSS/BLUE SHIELD | Source: Ambulatory Visit | Attending: Family Medicine | Admitting: Family Medicine

## 2017-08-20 DIAGNOSIS — Z1231 Encounter for screening mammogram for malignant neoplasm of breast: Secondary | ICD-10-CM | POA: Diagnosis present

## 2017-08-20 IMAGING — MG MM DIGITAL SCREENING BILAT W/ TOMO W/ CAD
9 of 13 series · 9 of 29 positions shown · non-contrast
Comparison: Previous exam(s).

CLINICAL DATA: Screening.

EXAM:
2D DIGITAL SCREENING BILATERAL MAMMOGRAM WITH CAD AND ADJUNCT TOMO

[L MLO (1 of 2)]
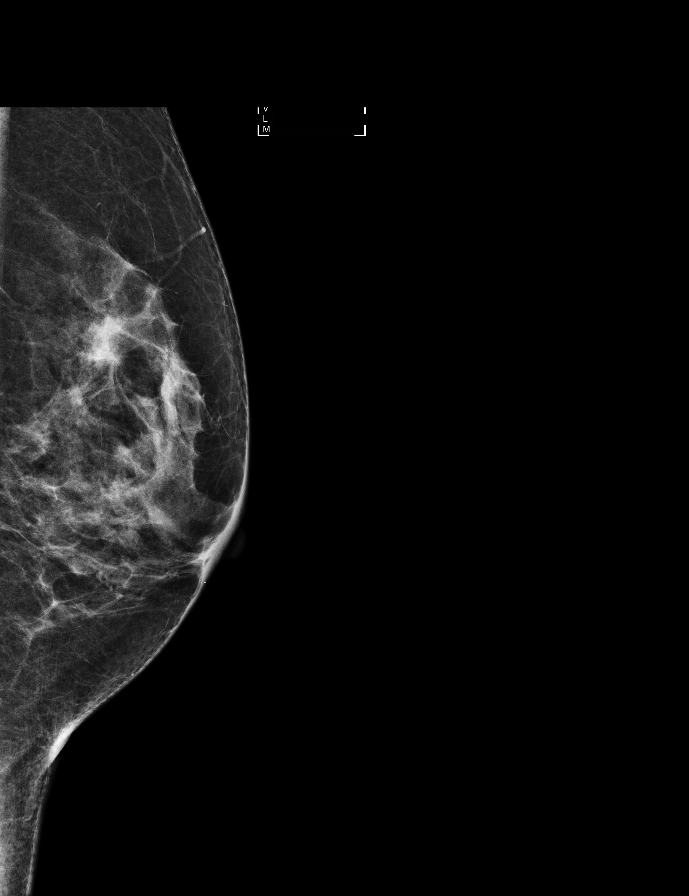

[R MLO synth-2D]
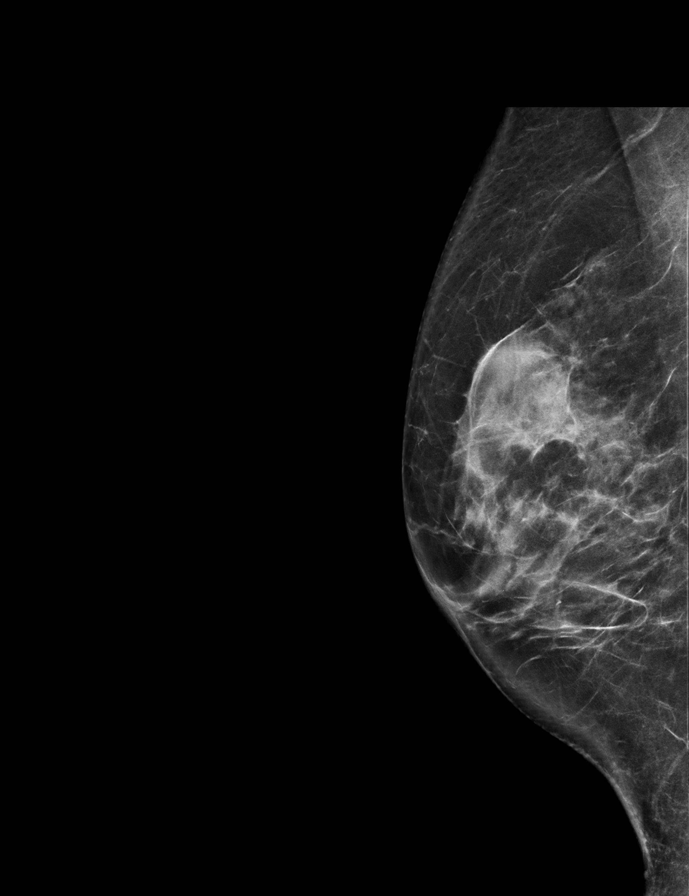

[R MLO]
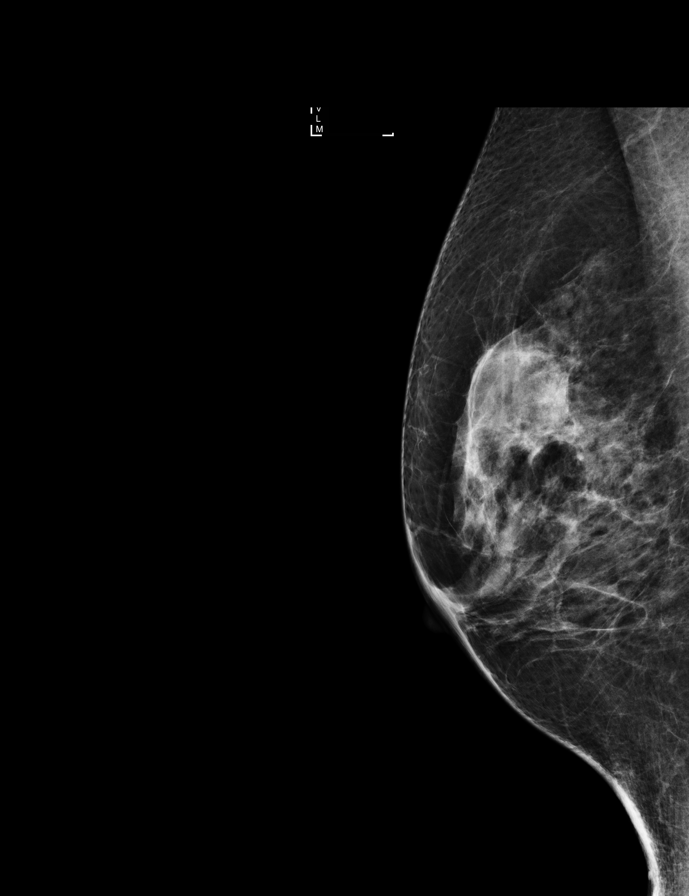

[R CC synth-2D]
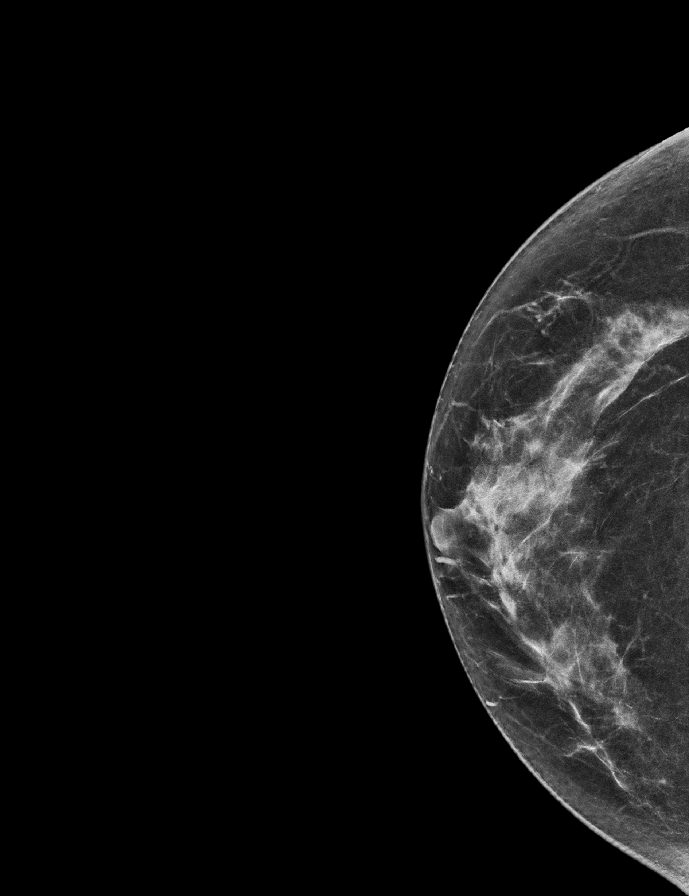

[R CC]
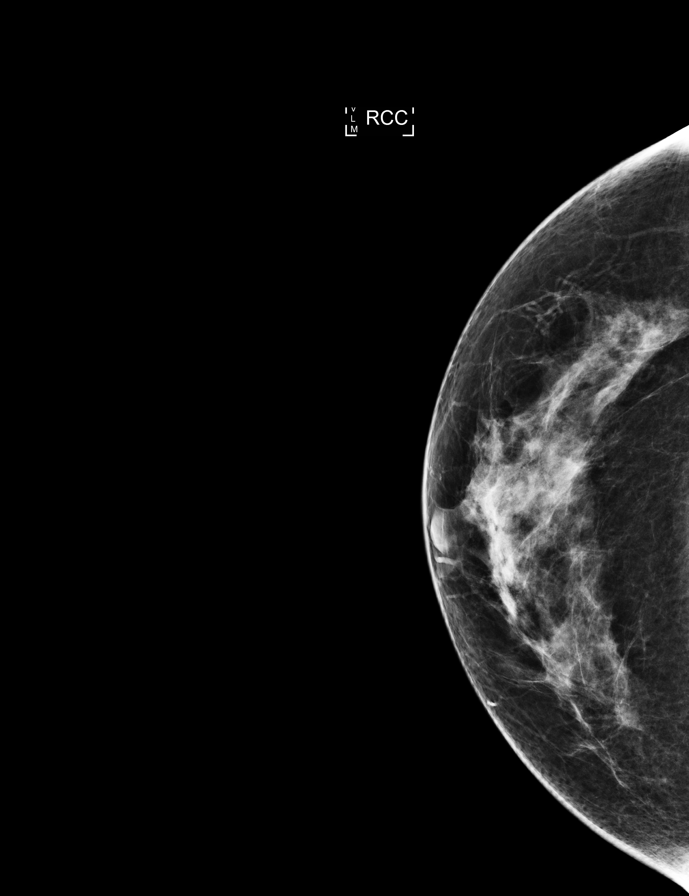

[L MLO synth-2D]
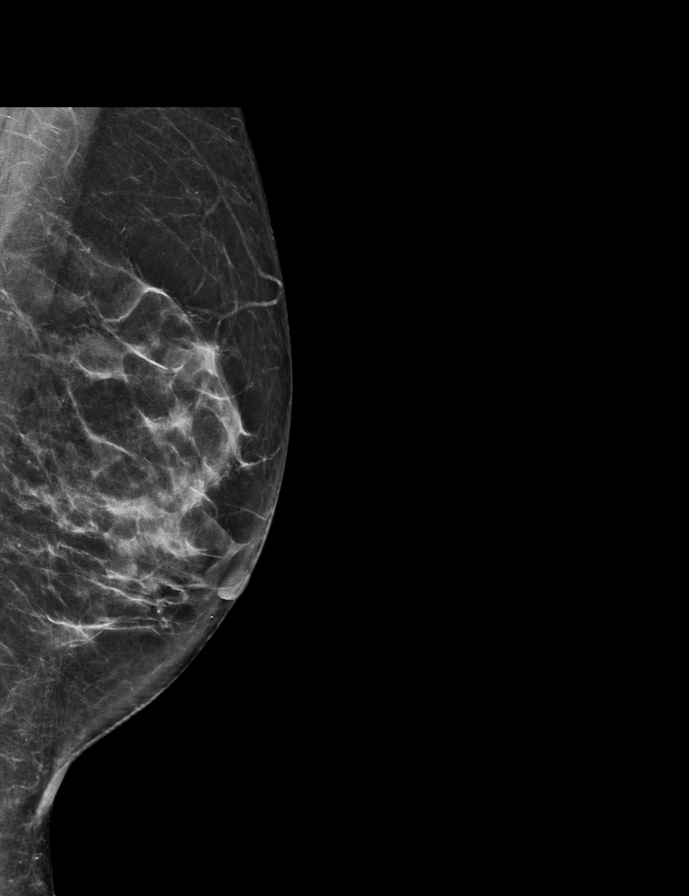

[L CC]
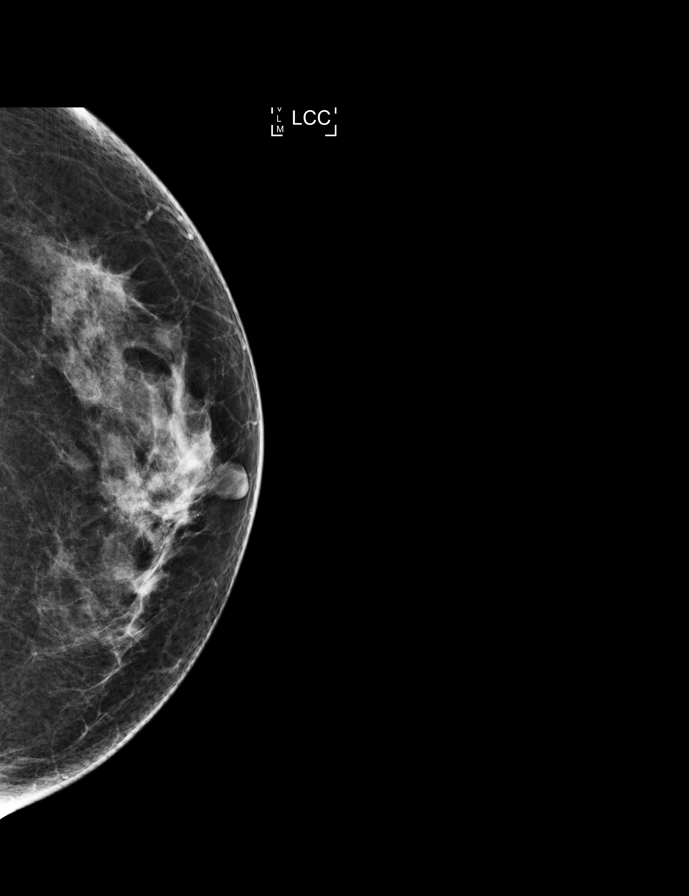

[L CC synth-2D]
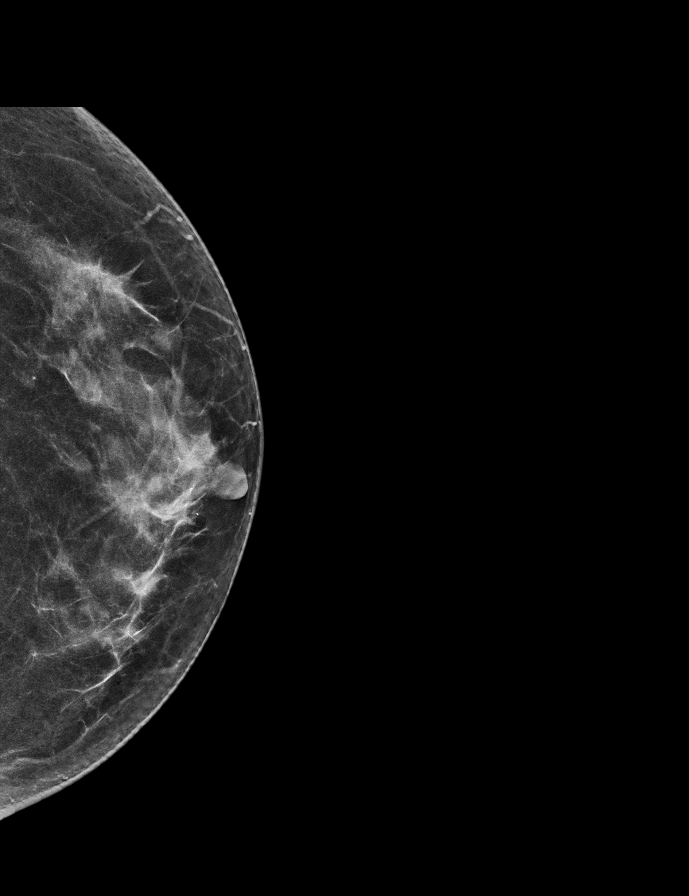

[L MLO (2 of 2)]
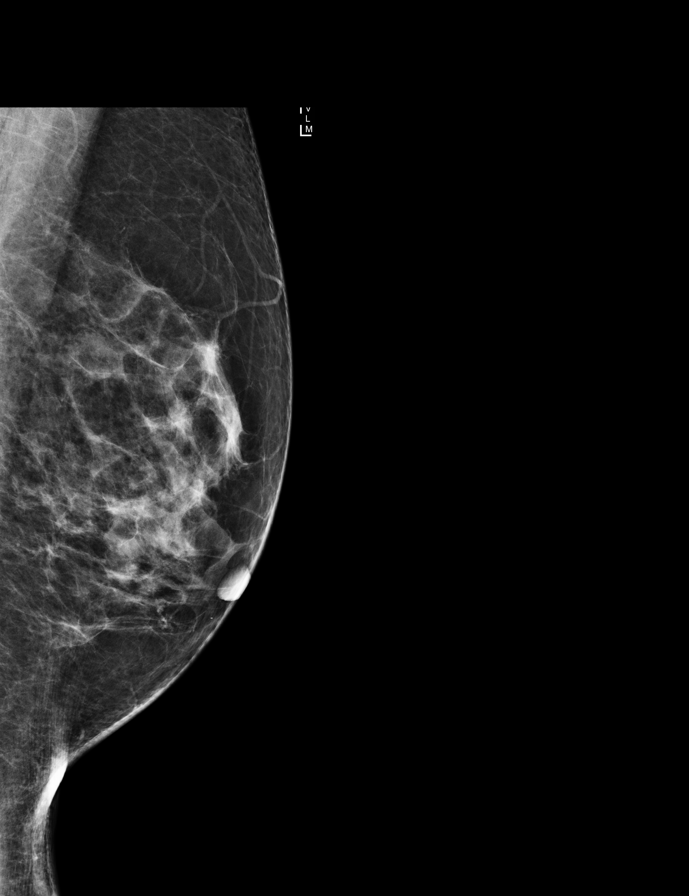

[9 of 29 positions shown; findings below may reference images not displayed]

ACR Breast Density Category c: The breast tissue is heterogeneously
dense, which may obscure small masses.
FINDINGS: There are no findings suspicious for malignancy. Images were
processed with CAD.
IMPRESSION: No mammographic evidence of malignancy. A result letter of this
screening mammogram will be mailed directly to the patient.

RECOMMENDATION:
Screening mammogram in one year. (Code:[TA])

BI-RADS CATEGORY  1: Negative.

## 2018-01-07 LAB — HM PAP SMEAR: HM Pap smear: NEGATIVE

## 2018-03-24 ENCOUNTER — Encounter: Payer: Self-pay | Admitting: *Deleted

## 2018-03-24 ENCOUNTER — Ambulatory Visit: Payer: BLUE CROSS/BLUE SHIELD | Admitting: Anesthesiology

## 2018-03-24 ENCOUNTER — Ambulatory Visit
Admission: RE | Admit: 2018-03-24 | Discharge: 2018-03-24 | Disposition: A | Discharge status: Home / Self Care | Payer: BLUE CROSS/BLUE SHIELD | Source: Ambulatory Visit | Attending: Gastroenterology | Admitting: Gastroenterology

## 2018-03-24 ENCOUNTER — Encounter
Admission: RE | Disposition: A | Discharge status: Home / Self Care | Payer: Self-pay | Source: Ambulatory Visit | Attending: Gastroenterology

## 2018-03-24 ENCOUNTER — Other Ambulatory Visit: Payer: Self-pay

## 2018-03-24 DIAGNOSIS — K219 Gastro-esophageal reflux disease without esophagitis: Secondary | ICD-10-CM | POA: Diagnosis not present

## 2018-03-24 DIAGNOSIS — Z86010 Personal history of colonic polyps: Secondary | ICD-10-CM | POA: Insufficient documentation

## 2018-03-24 DIAGNOSIS — Z79899 Other long term (current) drug therapy: Secondary | ICD-10-CM | POA: Diagnosis not present

## 2018-03-24 DIAGNOSIS — G4730 Sleep apnea, unspecified: Secondary | ICD-10-CM | POA: Diagnosis not present

## 2018-03-24 DIAGNOSIS — F17200 Nicotine dependence, unspecified, uncomplicated: Secondary | ICD-10-CM | POA: Insufficient documentation

## 2018-03-24 DIAGNOSIS — R1031 Right lower quadrant pain: Secondary | ICD-10-CM | POA: Diagnosis present

## 2018-03-24 DIAGNOSIS — K5730 Diverticulosis of large intestine without perforation or abscess without bleeding: Secondary | ICD-10-CM | POA: Insufficient documentation

## 2018-03-24 DIAGNOSIS — E039 Hypothyroidism, unspecified: Secondary | ICD-10-CM | POA: Insufficient documentation

## 2018-03-24 HISTORY — PX: COLONOSCOPY WITH PROPOFOL: SHX5780

## 2018-03-24 HISTORY — DX: Radiculopathy, lumbar region: M54.16

## 2018-03-24 HISTORY — DX: Gastro-esophageal reflux disease without esophagitis: K21.9

## 2018-03-24 HISTORY — DX: Disease of stomach and duodenum, unspecified: K31.9

## 2018-03-24 HISTORY — DX: Headache, unspecified: R51.9

## 2018-03-24 HISTORY — DX: Headache: R51

## 2018-03-24 HISTORY — DX: Solitary pulmonary nodule: R91.1

## 2018-03-24 SURGERY — COLONOSCOPY WITH PROPOFOL
Anesthesia: General

## 2018-03-24 MED ORDER — SODIUM CHLORIDE 0.9 % IV SOLN
INTRAVENOUS | Status: DC
  Administered 2018-03-24: 10:00:00 via INTRAVENOUS

## 2018-03-24 MED ORDER — PROPOFOL 500 MG/50ML IV EMUL
INTRAVENOUS | Status: DC | PRN
  Administered 2018-03-24: 130 ug/kg/min via INTRAVENOUS

## 2018-03-24 MED ORDER — PROPOFOL 500 MG/50ML IV EMUL
INTRAVENOUS | Status: AC
  Filled 2018-03-24: qty 50

## 2018-03-24 MED ORDER — LIDOCAINE 2% (20 MG/ML) 5 ML SYRINGE
INTRAMUSCULAR | Status: DC | PRN
  Administered 2018-03-24: 100 mg via INTRAVENOUS

## 2018-03-24 MED ORDER — PROPOFOL 10 MG/ML IV BOLUS
INTRAVENOUS | Status: DC | PRN
  Administered 2018-03-24: 60 mg via INTRAVENOUS

## 2018-03-24 MED ORDER — SODIUM CHLORIDE 0.9 % IV SOLN: INTRAVENOUS | Status: DC

## 2018-03-24 NOTE — Anesthesia Postprocedure Evaluation (Signed)
Anesthesia Post Note  Patient: Sherry Oliver  Procedure(s) Performed: COLONOSCOPY WITH PROPOFOL (N/A )  Patient location during evaluation: Endoscopy Anesthesia Type: General Level of consciousness: awake and alert Pain management: pain level controlled Vital Signs Assessment: post-procedure vital signs reviewed and stable Respiratory status: spontaneous breathing, nonlabored ventilation and respiratory function stable Cardiovascular status: blood pressure returned to baseline and stable Postop Assessment: no apparent nausea or vomiting Anesthetic complications: no     Last Vitals:  Vitals:   03/24/18 1138 03/24/18 1148  BP: 128/62 138/78  Pulse: 74   Resp: (!) 24 19  Temp:    SpO2: 98%     Last Pain:  Vitals:   03/24/18 1148  TempSrc:   PainSc: 0-No pain                 Alphonsus Sias

## 2018-03-24 NOTE — Anesthesia Preprocedure Evaluation (Addendum)
Anesthesia Evaluation  Patient identified by MRN, date of birth, ID band Patient awake    Reviewed: Allergy & Precautions, H&P , NPO status , reviewed documented beta blocker date and time   Airway Mallampati: II  TM Distance: >3 FB Neck ROM: full    Dental  (+) Chipped, Caps   Pulmonary sleep apnea and Continuous Positive Airway Pressure Ventilation , Current Smoker,    Pulmonary exam normal        Cardiovascular Normal cardiovascular exam     Neuro/Psych  Headaches,  Neuromuscular disease    GI/Hepatic GERD  Controlled and Medicated,  Endo/Other  Hypothyroidism   Renal/GU      Musculoskeletal   Abdominal   Peds  Hematology   Anesthesia Other Findings Past Medical History: No date: Gastropathy No date: GERD (gastroesophageal reflux disease) No date: Headache No date: Hypothyroid No date: Lumbar radiculopathy No date: Lung nodule No date: Sleep apnea  Past Surgical History: No date: APPENDECTOMY No date: BLADDER SURGERY 02/19/2015: COLONOSCOPY WITH PROPOFOL; N/A     Comment:  Procedure: COLONOSCOPY WITH PROPOFOL;  Surgeon: Lollie Sails, MD;  Location: [facility] ENDOSCOPY;  Service:               Endoscopy;  Laterality: N/A; 1982: ELBOW SURGERY; Right No date: ESOPHAGOGASTRODUODENOSCOPY No date: FOOT SURGERY; Left     Comment:  neuroma removal     Reproductive/Obstetrics                            Anesthesia Physical Anesthesia Plan  ASA: III  Anesthesia Plan: General   Post-op Pain Management:    Induction:   PONV Risk Score and Plan: 2 and Treatment may vary due to age or medical condition and TIVA  Airway Management Planned:   Additional Equipment:   Intra-op Plan:   Post-operative Plan:   Informed Consent: I have reviewed the patients History and Physical, chart, labs and discussed the procedure including the risks, benefits and alternatives for the  proposed anesthesia with the patient or authorized representative who has indicated his/her understanding and acceptance.   Dental Advisory Given  Plan Discussed with: CRNA  Anesthesia Plan Comments:        Anesthesia Quick Evaluation

## 2018-03-24 NOTE — Op Note (Addendum)
[facility] Gastroenterology Patient Name: Sherry Oliver Procedure Date: 03/24/2018 10:53 AM MRN: [id number] Account #: [id number] Date of Birth: 1961-02-02 Admit Type: Outpatient Age: 57 Room: [facility] ENDO ROOM 2 Gender: Female Note Status: Finalized Procedure:            Colonoscopy Indications:          Abdominal pain in the right lower quadrant, Personal                        history of colonic polyps Providers:            Lollie Sails, MD Referring MD:         Kerin Perna MD, MD (Referring MD) Medicines:            Monitored Anesthesia Care Complications:        No immediate complications. Procedure:            Pre-Anesthesia Assessment:                       - ASA Grade Assessment: III - A patient with severe                        systemic disease.                       After obtaining informed consent, the colonoscope was                        passed under direct vision. Throughout the procedure,                        the patient's blood pressure, pulse, and oxygen                        saturations were monitored continuously. The was                        introduced through the anus and advanced to the the                        terminal ileum. The colonoscopy was performed without                        difficulty. The patient tolerated the procedure well.                        The quality of the bowel preparation was good. Findings:      A few small-mouthed diverticula were found in the sigmoid colon.      The exam was otherwise without abnormality.      The retroflexed view of the distal rectum and anal verge was normal and       showed no anal or rectal abnormalities.      The digital rectal exam was normal.      The terminal ileum appeared normal. Impression:           - Diverticulosis in the sigmoid colon.                       - The examination was otherwise normal.                       -  The distal rectum and anal verge are  normal on                        retroflexion view.                       - No specimens collected. Recommendation:       - Discharge patient to home.                       - Repeat colonoscopy in 5 years for screening purposes. Procedure Code(s):    --- Professional ---                       [phone], Colonoscopy, flexible; diagnostic, including                        collection of specimen(s) by brushing or washing, when                        performed (separate procedure) CPT copyright 2017 American Medical Association. All rights reserved. The codes documented in this report are preliminary and upon coder review may  be revised to meet current compliance requirements. Lollie Sails, MD 03/24/2018 11:19:44 AM This report has been signed electronically. Number of Addenda: 0 Note Initiated On: 03/24/2018 10:53 AM Scope Withdrawal Time: 0 hours 7 minutes 50 seconds  Total Procedure Duration: 0 hours 13 minutes 58 seconds       [facility]

## 2018-03-24 NOTE — Anesthesia Post-op Follow-up Note (Signed)
Anesthesia QCDR form completed.        

## 2018-03-24 NOTE — Transfer of Care (Signed)
Immediate Anesthesia Transfer of Care Note  Patient: Sherry Oliver  Procedure(s) Performed: COLONOSCOPY WITH PROPOFOL (N/A )  Patient Location: Endoscopy Unit  Anesthesia Type:General  Level of Consciousness: awake  Airway & Oxygen Therapy: Patient Spontanous Breathing  Post-op Assessment: Post -op Vital signs reviewed and stable  Post vital signs: stable  Last Vitals:  Vitals Value Taken Time  BP 130/84 03/24/2018 11:18 AM  Temp 36.1 C 03/24/2018 11:18 AM  Pulse 83 03/24/2018 11:19 AM  Resp 23 03/24/2018 11:19 AM  SpO2 96 % 03/24/2018 11:19 AM  Vitals shown include unvalidated device data.  Last Pain:  Vitals:   03/24/18 1118  TempSrc: Tympanic  PainSc:          Complications: No apparent anesthesia complications

## 2018-03-24 NOTE — H&P (Signed)
Outpatient short stay form Pre-procedure 03/24/2018 10:45 AM Lollie Sails MD  Primary Physician: Dr. Hortencia Pilar  Reason for visit: Colonoscopy  History of present illness: Patient is a 57 year old female presenting today as above.  She has history of personal history of adenomatous colon polyps.  She has had some intermittent right lower quadrant pain that at times is fleeting and sometimes achy.  She has had a CT scan of the abdomen which was uninformative.    Current Facility-Administered Medications:  .  0.9 %  sodium chloride infusion, , Intravenous, Continuous, Lollie Sails, MD .  0.9 %  sodium chloride infusion, , Intravenous, Continuous, Lollie Sails, MD, Last Rate: 20 mL/hr at 03/24/18 1019  Medications Prior to Admission  Medication Sig Dispense Refill Last Dose  . Ascorbic Acid (VITAMIN C) 1000 MG tablet Take 1,000 mg by mouth daily.   Past Week at Unknown time  . b complex vitamins tablet Take 1 tablet by mouth daily.   Past Week at Unknown time  . Calcium Carbonate-Vitamin D 600-400 MG-UNIT per tablet Take 1 tablet by mouth daily.   Past Week at Unknown time  . clobetasol ointment (TEMOVATE) 9.32 % Apply 1 application topically 2 (two) times daily as needed (rash).   Past Week at Unknown time  . esomeprazole (NEXIUM) 40 MG capsule Take 40 mg by mouth daily at 12 noon.   03/23/2018 at 2100  . fluticasone (FLONASE) 50 MCG/ACT nasal spray Place 2 sprays into both nostrils daily.   Past Week at Unknown time  . ibuprofen (ADVIL,MOTRIN) 200 MG tablet Take 400 mg by mouth 2 (two) times daily.   Past Week at Unknown time  . levothyroxine (SYNTHROID, LEVOTHROID) 88 MCG tablet Take 88 mcg by mouth daily before breakfast.   03/24/2018 at 0800  . liothyronine (CYTOMEL) 5 MCG tablet Take 5 mcg by mouth daily.   Past Week at Unknown time  . Multiple Vitamin (MULTIVITAMIN WITH MINERALS) TABS tablet Take 1 tablet by mouth daily.   Past Week at Unknown time  . varenicline  (CHANTIX) 1 MG tablet Take 1 mg by mouth 2 (two) times daily.   03/23/2018 at 2100     Allergies  Allergen Reactions  . Chlorzoxazone Nausea And Vomiting  . Crestor [Rosuvastatin]     Tired and depressed  . Latex Swelling  . Sulfur Other (See Comments)    Hyperactive   . Tomato   . Benzocaine Rash  . Cobalt Rash  . Dibucaine Rash  . Flonase [Fluticasone] Rash  . Formaldehyde Rash  . Nickel Sulfate [Nickel] Rash  . Penicillins Rash  . Potassium Dichromate Rash  . Tetracaine Rash     Past Medical History:  Diagnosis Date  . Gastropathy   . GERD (gastroesophageal reflux disease)   . Headache   . Hypothyroid   . Lumbar radiculopathy   . Lung nodule   . Sleep apnea      Review of systems:      Physical Exam    Heart and lungs: Without rub or gallop, lungs are bilaterally clear.    HEENT: Normocephalic atraumatic eyes are anicteric    Other:    Pertinant exam for procedure: Soft nontender nondistended bowel sounds positive normoactive.    Planned proceedures: Colonoscopy and indicated procedures. I have discussed the risks benefits and complications of procedures to include not limited to bleeding, infection, perforation and the risk of sedation and the patient wishes to proceed.    Billie Ruddy  Gustavo Lah, MD Gastroenterology 03/24/2018  10:45 AM

## 2018-03-27 ENCOUNTER — Encounter: Payer: Self-pay | Admitting: Gastroenterology

## 2018-07-19 DIAGNOSIS — M5432 Sciatica, left side: Secondary | ICD-10-CM | POA: Diagnosis not present

## 2018-07-19 DIAGNOSIS — M9905 Segmental and somatic dysfunction of pelvic region: Secondary | ICD-10-CM | POA: Diagnosis not present

## 2018-07-19 DIAGNOSIS — M9903 Segmental and somatic dysfunction of lumbar region: Secondary | ICD-10-CM | POA: Diagnosis not present

## 2018-07-19 DIAGNOSIS — M5136 Other intervertebral disc degeneration, lumbar region: Secondary | ICD-10-CM | POA: Diagnosis not present

## 2018-07-28 DIAGNOSIS — R040 Epistaxis: Secondary | ICD-10-CM | POA: Diagnosis not present

## 2018-07-28 DIAGNOSIS — J3489 Other specified disorders of nose and nasal sinuses: Secondary | ICD-10-CM | POA: Diagnosis not present

## 2018-07-28 DIAGNOSIS — J342 Deviated nasal septum: Secondary | ICD-10-CM | POA: Diagnosis not present

## 2018-07-28 DIAGNOSIS — G4733 Obstructive sleep apnea (adult) (pediatric): Secondary | ICD-10-CM | POA: Diagnosis not present

## 2018-08-02 DIAGNOSIS — M5136 Other intervertebral disc degeneration, lumbar region: Secondary | ICD-10-CM | POA: Diagnosis not present

## 2018-08-02 DIAGNOSIS — M5432 Sciatica, left side: Secondary | ICD-10-CM | POA: Diagnosis not present

## 2018-08-02 DIAGNOSIS — M9903 Segmental and somatic dysfunction of lumbar region: Secondary | ICD-10-CM | POA: Diagnosis not present

## 2018-08-02 DIAGNOSIS — M9905 Segmental and somatic dysfunction of pelvic region: Secondary | ICD-10-CM | POA: Diagnosis not present

## 2018-08-09 DIAGNOSIS — K0500 Acute gingivitis, plaque induced: Secondary | ICD-10-CM | POA: Diagnosis not present

## 2018-08-09 DIAGNOSIS — Z1281 Encounter for screening for malignant neoplasm of oral cavity: Secondary | ICD-10-CM | POA: Diagnosis not present

## 2018-08-30 DIAGNOSIS — M5136 Other intervertebral disc degeneration, lumbar region: Secondary | ICD-10-CM | POA: Diagnosis not present

## 2018-08-30 DIAGNOSIS — M9905 Segmental and somatic dysfunction of pelvic region: Secondary | ICD-10-CM | POA: Diagnosis not present

## 2018-08-30 DIAGNOSIS — M9903 Segmental and somatic dysfunction of lumbar region: Secondary | ICD-10-CM | POA: Diagnosis not present

## 2018-08-30 DIAGNOSIS — M5432 Sciatica, left side: Secondary | ICD-10-CM | POA: Diagnosis not present

## 2018-09-01 DIAGNOSIS — K219 Gastro-esophageal reflux disease without esophagitis: Secondary | ICD-10-CM | POA: Diagnosis not present

## 2018-09-01 DIAGNOSIS — E7800 Pure hypercholesterolemia, unspecified: Secondary | ICD-10-CM | POA: Diagnosis not present

## 2018-09-01 DIAGNOSIS — R0602 Shortness of breath: Secondary | ICD-10-CM | POA: Diagnosis not present

## 2018-09-01 DIAGNOSIS — Z720 Tobacco use: Secondary | ICD-10-CM | POA: Diagnosis not present

## 2018-09-08 DIAGNOSIS — Z23 Encounter for immunization: Secondary | ICD-10-CM | POA: Diagnosis not present

## 2018-09-15 DIAGNOSIS — K08409 Partial loss of teeth, unspecified cause, unspecified class: Secondary | ICD-10-CM | POA: Diagnosis not present

## 2018-09-27 DIAGNOSIS — M5432 Sciatica, left side: Secondary | ICD-10-CM | POA: Diagnosis not present

## 2018-09-27 DIAGNOSIS — R0602 Shortness of breath: Secondary | ICD-10-CM | POA: Diagnosis not present

## 2018-09-27 DIAGNOSIS — M9905 Segmental and somatic dysfunction of pelvic region: Secondary | ICD-10-CM | POA: Diagnosis not present

## 2018-09-27 DIAGNOSIS — M5136 Other intervertebral disc degeneration, lumbar region: Secondary | ICD-10-CM | POA: Diagnosis not present

## 2018-09-27 DIAGNOSIS — M9903 Segmental and somatic dysfunction of lumbar region: Secondary | ICD-10-CM | POA: Diagnosis not present

## 2018-09-27 DIAGNOSIS — R079 Chest pain, unspecified: Secondary | ICD-10-CM | POA: Diagnosis not present

## 2018-10-04 DIAGNOSIS — E039 Hypothyroidism, unspecified: Secondary | ICD-10-CM | POA: Diagnosis not present

## 2018-10-04 DIAGNOSIS — Z8669 Personal history of other diseases of the nervous system and sense organs: Secondary | ICD-10-CM | POA: Diagnosis not present

## 2018-10-04 DIAGNOSIS — E7800 Pure hypercholesterolemia, unspecified: Secondary | ICD-10-CM | POA: Diagnosis not present

## 2018-10-04 DIAGNOSIS — K219 Gastro-esophageal reflux disease without esophagitis: Secondary | ICD-10-CM | POA: Diagnosis not present

## 2018-10-12 ENCOUNTER — Other Ambulatory Visit: Payer: Self-pay | Admitting: Pulmonary Disease

## 2018-10-12 DIAGNOSIS — R0609 Other forms of dyspnea: Secondary | ICD-10-CM | POA: Diagnosis not present

## 2018-10-12 DIAGNOSIS — R0600 Dyspnea, unspecified: Secondary | ICD-10-CM

## 2018-10-12 DIAGNOSIS — R911 Solitary pulmonary nodule: Secondary | ICD-10-CM | POA: Diagnosis not present

## 2018-10-12 DIAGNOSIS — Z9989 Dependence on other enabling machines and devices: Secondary | ICD-10-CM | POA: Diagnosis not present

## 2018-10-12 DIAGNOSIS — G4733 Obstructive sleep apnea (adult) (pediatric): Secondary | ICD-10-CM | POA: Diagnosis not present

## 2018-10-25 DIAGNOSIS — M5136 Other intervertebral disc degeneration, lumbar region: Secondary | ICD-10-CM | POA: Diagnosis not present

## 2018-10-25 DIAGNOSIS — M9905 Segmental and somatic dysfunction of pelvic region: Secondary | ICD-10-CM | POA: Diagnosis not present

## 2018-10-25 DIAGNOSIS — M5432 Sciatica, left side: Secondary | ICD-10-CM | POA: Diagnosis not present

## 2018-10-25 DIAGNOSIS — M9903 Segmental and somatic dysfunction of lumbar region: Secondary | ICD-10-CM | POA: Diagnosis not present

## 2018-11-04 ENCOUNTER — Other Ambulatory Visit: Payer: Self-pay | Admitting: Family Medicine

## 2018-11-04 DIAGNOSIS — Z1231 Encounter for screening mammogram for malignant neoplasm of breast: Secondary | ICD-10-CM

## 2018-11-09 ENCOUNTER — Ambulatory Visit
Admission: RE | Admit: 2018-11-09 | Discharge: 2018-11-09 | Disposition: A | Discharge status: Home / Self Care | Payer: BLUE CROSS/BLUE SHIELD | Source: Ambulatory Visit | Attending: Family Medicine | Admitting: Family Medicine

## 2018-11-09 ENCOUNTER — Encounter (INDEPENDENT_AMBULATORY_CARE_PROVIDER_SITE_OTHER): Payer: Self-pay

## 2018-11-09 DIAGNOSIS — Z1231 Encounter for screening mammogram for malignant neoplasm of breast: Secondary | ICD-10-CM | POA: Diagnosis not present

## 2018-11-09 IMAGING — MG DIGITAL SCREENING BILATERAL MAMMOGRAM WITH TOMO AND CAD
6 of 12 series · 6 of 36 positions shown · non-contrast
Comparison: Previous exam(s).

CLINICAL DATA: Screening.

EXAM:
DIGITAL SCREENING BILATERAL MAMMOGRAM WITH TOMO AND CAD

[L CC synth-2D (1 of 2)]
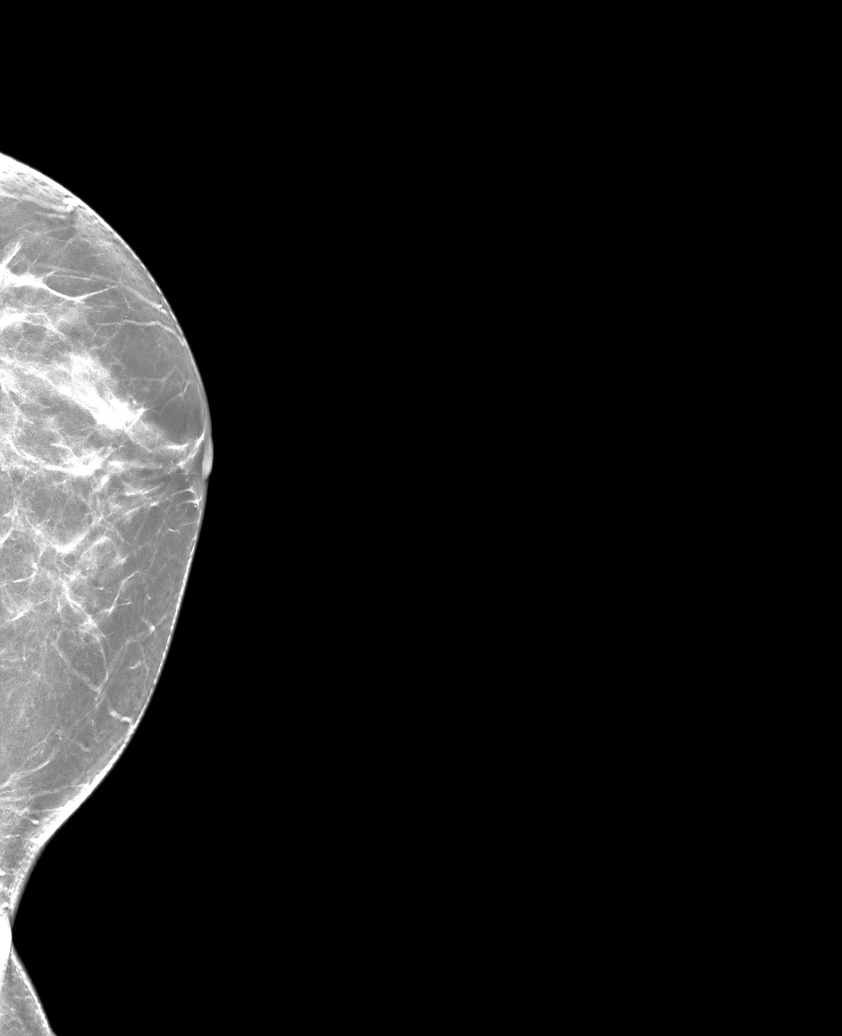

[L CC synth-2D (2 of 2)]
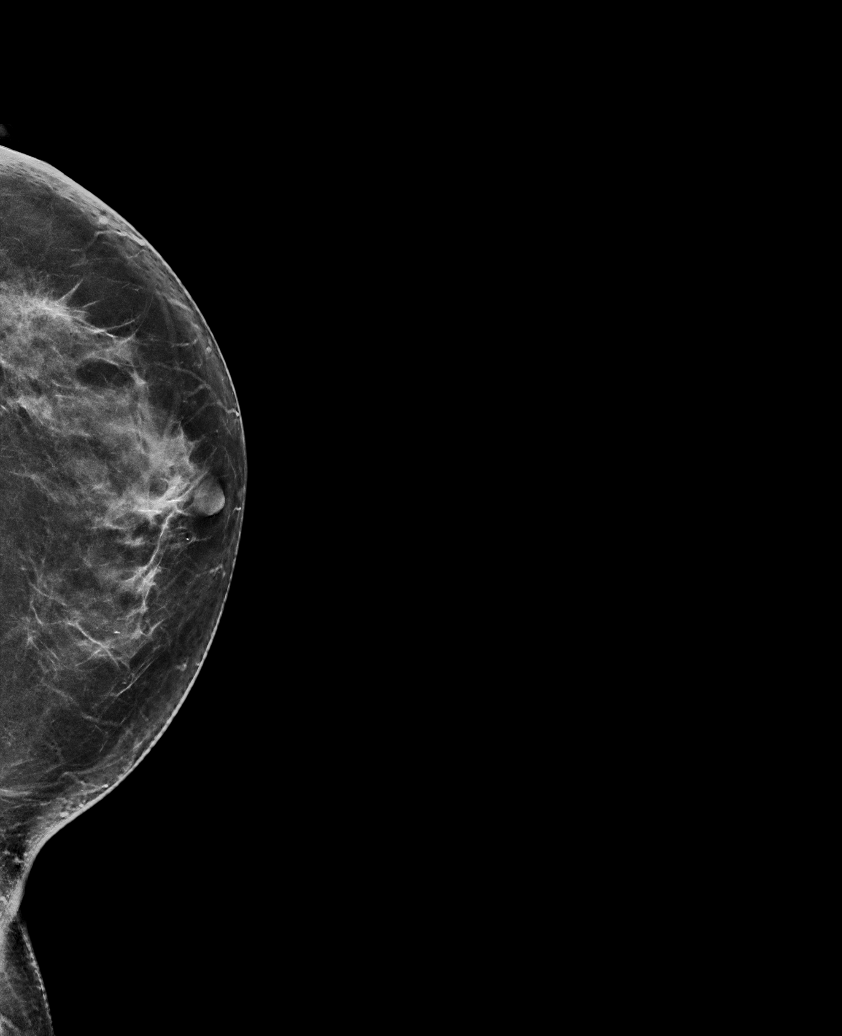

[R CC synth-2D (1 of 2)]
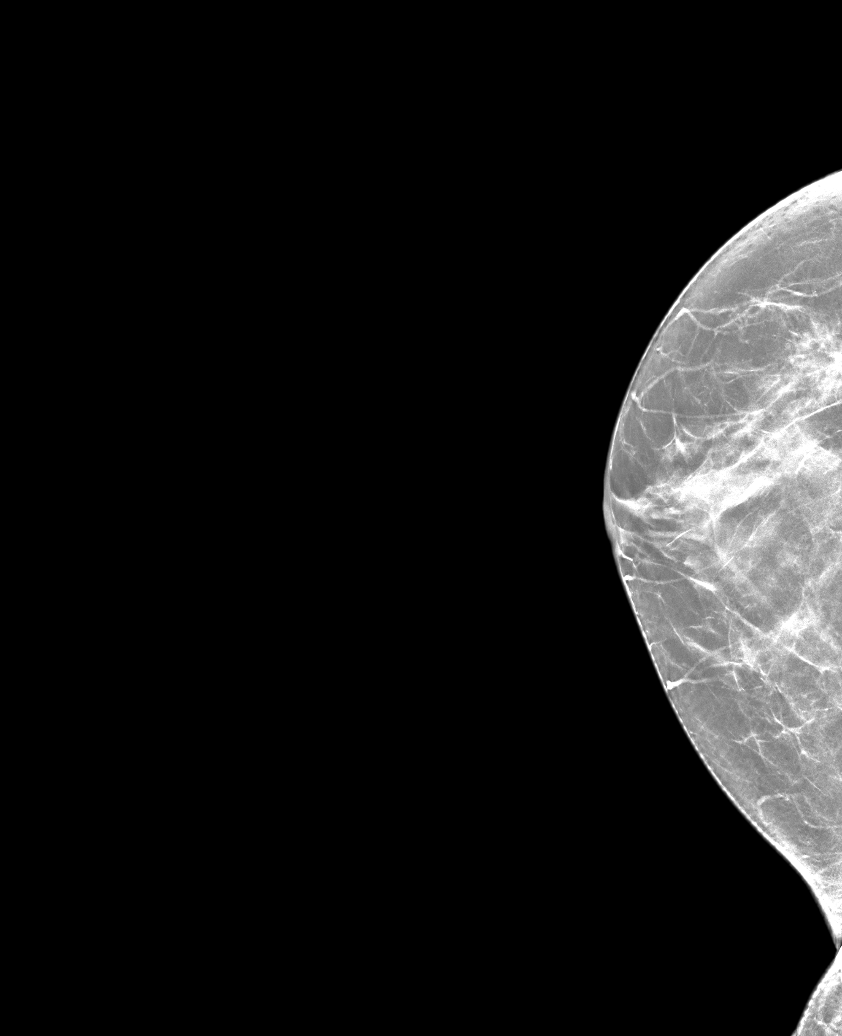

[L MLO synth-2D]
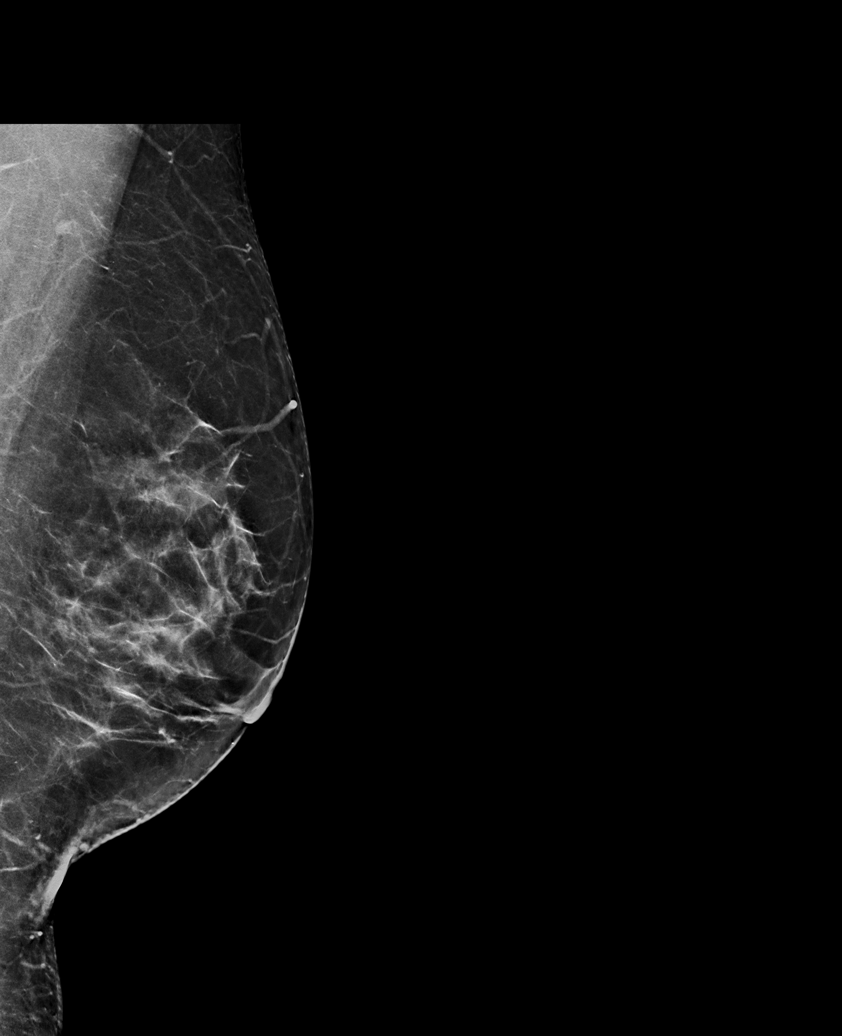

[R CC synth-2D (2 of 2)]
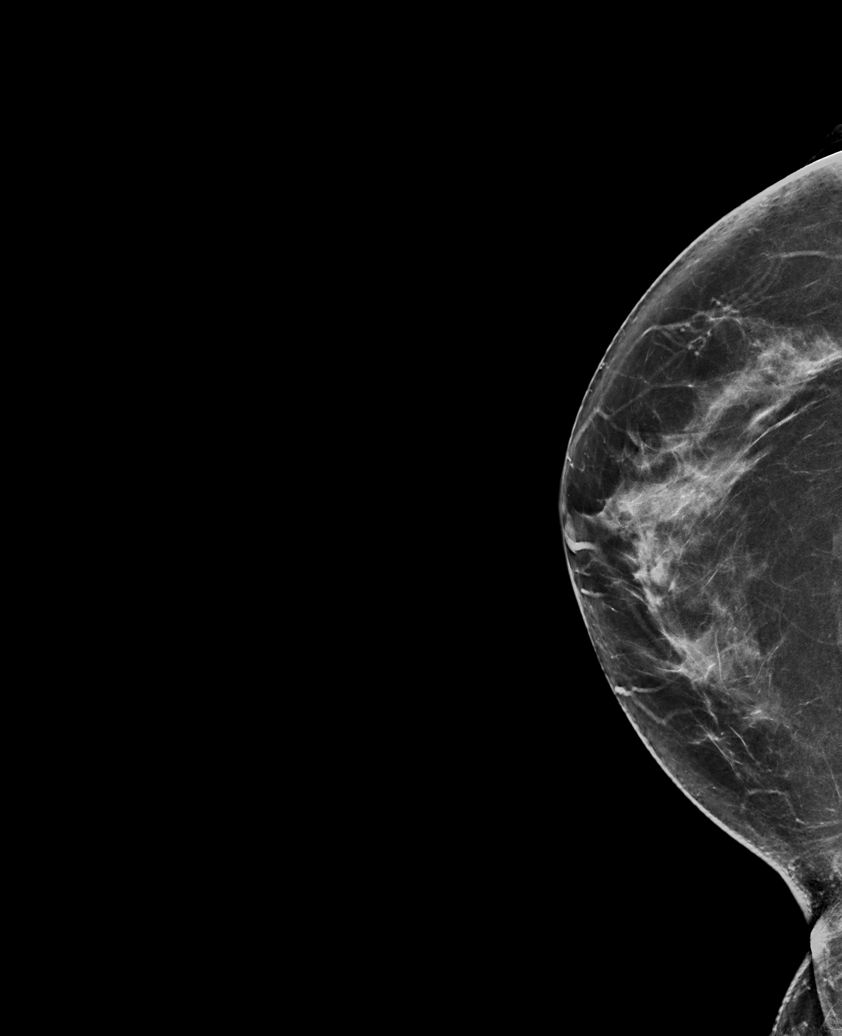

[R MLO synth-2D]
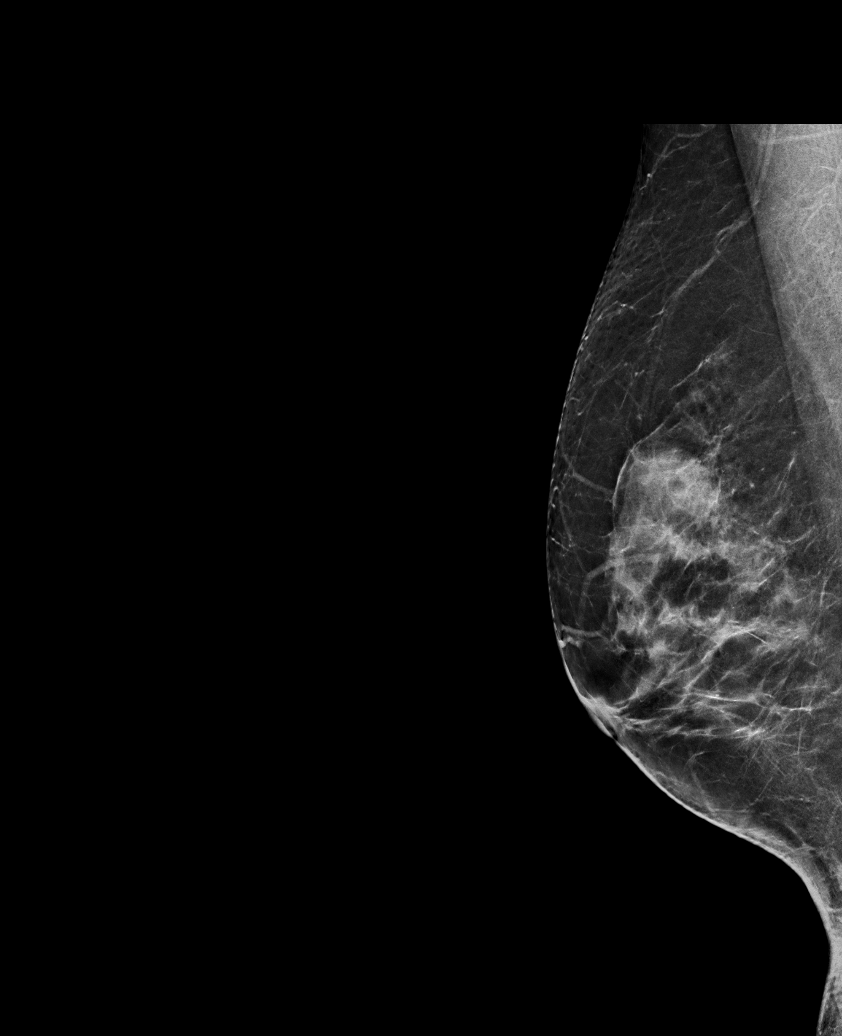

[6 of 36 positions shown; findings below may reference images not displayed]

ACR Breast Density Category b: There are scattered areas of
fibroglandular density.
FINDINGS: In the left axilla, a possible mass warrants further evaluation. In
the right breast, no findings suspicious for malignancy.

Images were processed with CAD.
IMPRESSION: Further evaluation is suggested for possible mass in the left
axilla.

RECOMMENDATION:
Ultrasound of the left axilla. (Code:[VP])

The patient will be contacted regarding the findings, and additional
imaging will be scheduled.

BI-RADS CATEGORY  0: Incomplete. Need additional imaging evaluation
and/or prior mammograms for comparison.

## 2018-11-10 ENCOUNTER — Other Ambulatory Visit: Payer: Self-pay | Admitting: Family Medicine

## 2018-11-10 DIAGNOSIS — R2232 Localized swelling, mass and lump, left upper limb: Secondary | ICD-10-CM

## 2018-11-10 DIAGNOSIS — R928 Other abnormal and inconclusive findings on diagnostic imaging of breast: Secondary | ICD-10-CM

## 2018-11-11 ENCOUNTER — Ambulatory Visit
Admission: RE | Admit: 2018-11-11 | Discharge: 2018-11-11 | Disposition: A | Discharge status: Home / Self Care | Payer: BLUE CROSS/BLUE SHIELD | Source: Ambulatory Visit | Attending: Pulmonary Disease | Admitting: Pulmonary Disease

## 2018-11-11 DIAGNOSIS — R0609 Other forms of dyspnea: Secondary | ICD-10-CM | POA: Diagnosis not present

## 2018-11-11 DIAGNOSIS — R911 Solitary pulmonary nodule: Secondary | ICD-10-CM | POA: Diagnosis not present

## 2018-11-11 DIAGNOSIS — R0600 Dyspnea, unspecified: Secondary | ICD-10-CM

## 2018-11-11 DIAGNOSIS — R0602 Shortness of breath: Secondary | ICD-10-CM | POA: Diagnosis not present

## 2018-11-11 IMAGING — CT CT CHEST W/O CM
2 of 3 series · 15 of 36 positions shown, 18 images · non-contrast
Comparison: Right rib radiographs dated [DATE].

CLINICAL DATA: Shortness of breath for the past 6 months.
Ex-smoker.

EXAM:
CT CHEST WITHOUT CONTRAST
TECHNIQUE: Multidetector CT imaging of the chest was performed following the
standard protocol without IV contrast.

[Series 2: thorax · axial · 0.58mm/px · z∈[-296,-64]mm · 12 of 138 slices shown, 15 images]
[im 11/138  mediastinal]
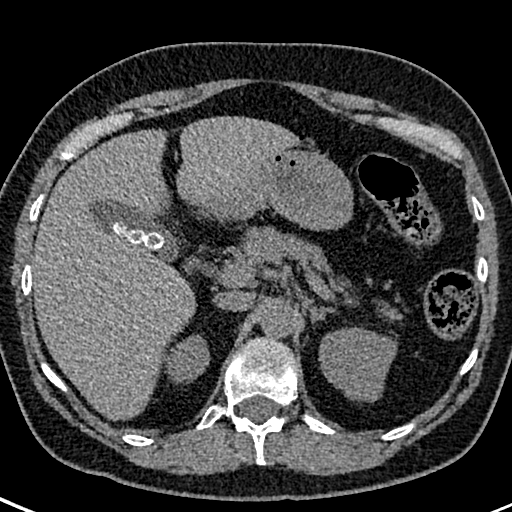
[im 11/138  lung]
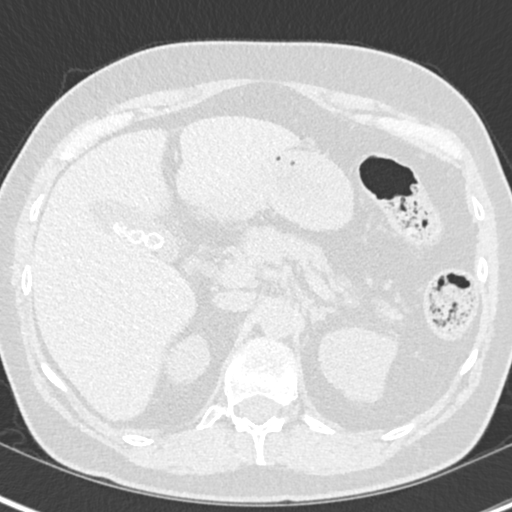
[im 21/138  lung]
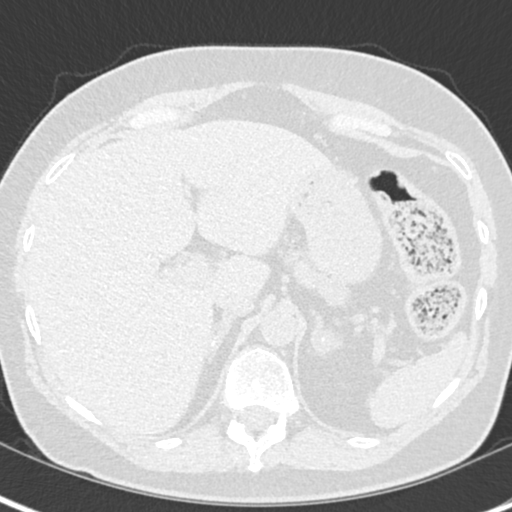
[im 31/138  lung]
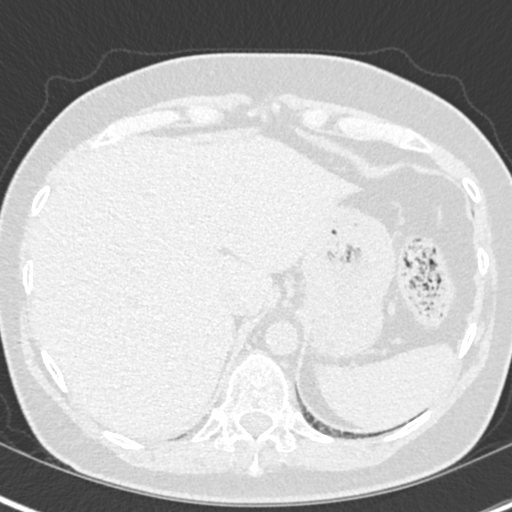
[im 41/138  lung]
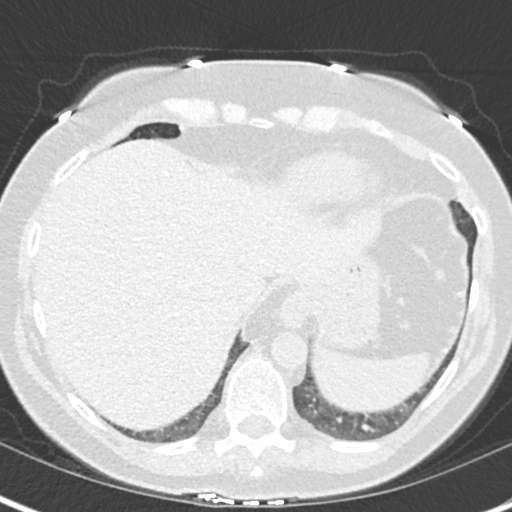
[im 51/138  mediastinal]
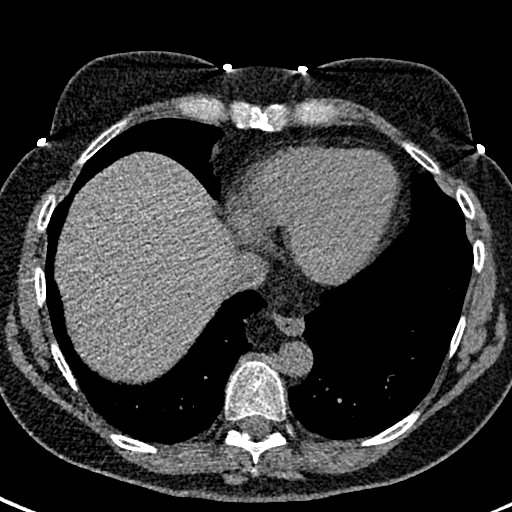
[im 51/138  lung]
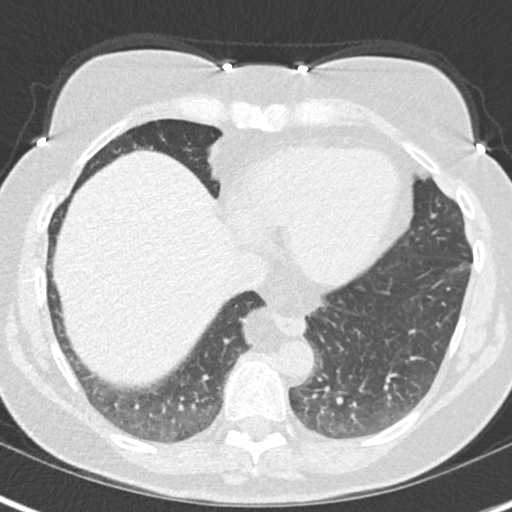
[im 61/138  lung]
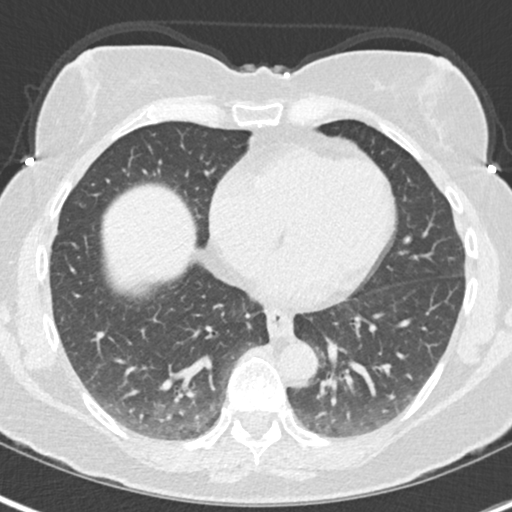
[im 77/138  lung]
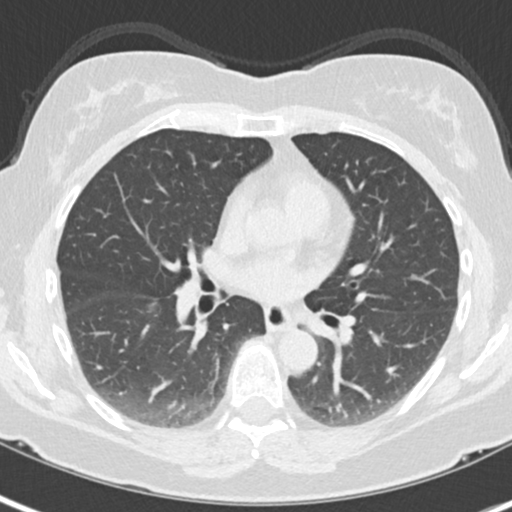
[im 87/138  lung]
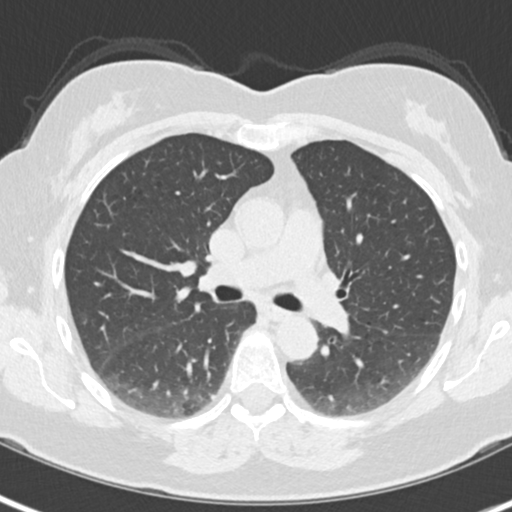
[im 97/138  mediastinal]
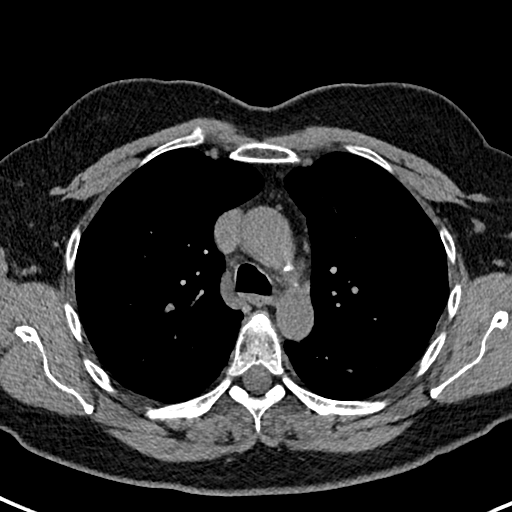
[im 97/138  lung]
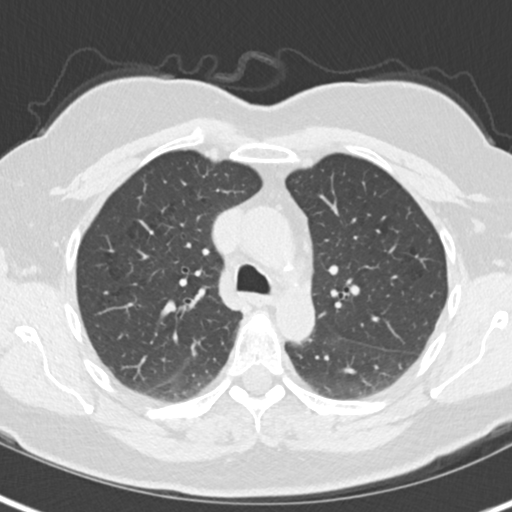
[im 107/138  lung]
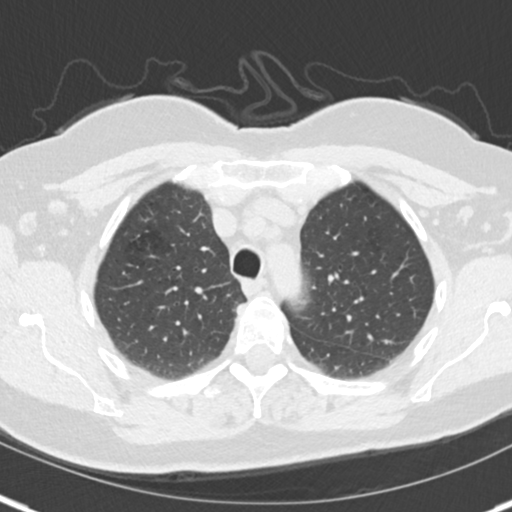
[im 117/138  lung]
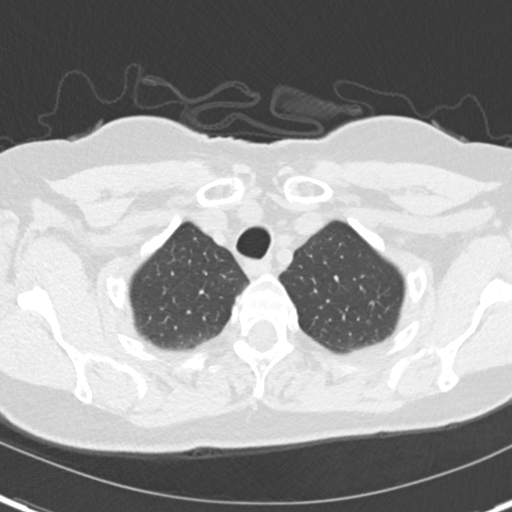
[im 127/138  lung]
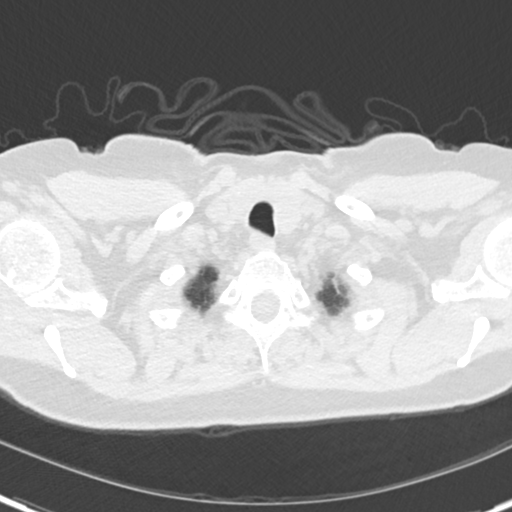

[Series 5: coronal · coronal · 0.60mm/px · 3 of 127 slices shown]
[im 26/127  lung]
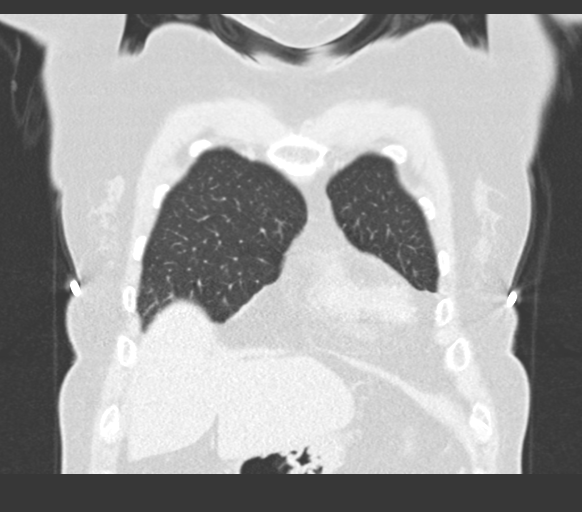
[im 51/127  lung]
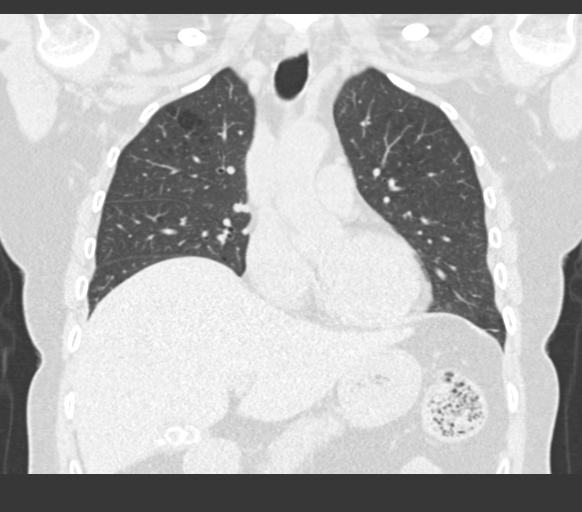
[im 76/127  lung]
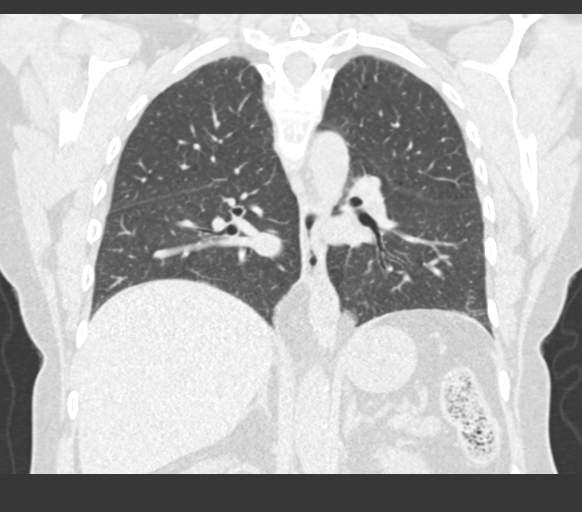

[15 of 36 positions shown; findings below may reference images not displayed]

FINDINGS: Cardiovascular: Mild Thoracic aortic atheromatous calcifications.
Normal sized heart.

Mediastinum/Nodes: Small to moderate amount of herniated fat
extending through the diaphragmatic hiatus with no gastric hiatal
hernia seen. No enlarged lymph nodes. Normal appearing thyroid
gland.

Lungs/Pleura: 5 mm right middle lobe nodule on image number 86
series 3. 5 mm nodule within or abutting the minor fissure on image
number 56 of series 3 and coronal image number 40, compatible with a
subpleural lymph node. Minimal bilateral lower lobe paraseptal
bullous changes. Mild right upper lobe centrilobular bullous
changes.

Upper Abdomen: Multiple gallstones in the gallbladder measuring up
to 1.4 cm in maximum diameter each. No gallbladder wall thickening
or pericholecystic fluid. Small amount of bilateral adrenal
calcification. Small low density left adrenal nodule measuring as
low as 6 Hounsfield units in density. This measures 1.6 x 1.2 cm on
image number 117 series 2.

Musculoskeletal: Approximately 20% T9 vertebral compression
deformity with no acute fracture lines. Minimal T6, T7 and T8
vertebral compression deformities with no acute fracture lines. No
bony retropulsion at any level. Mild anterior spur formation at
multiple levels.
IMPRESSION: 1. No acute abnormality.
2. 5 mm right middle lobe nodule and 5 mm probable subpleural lymph
node along the minor fissure. No follow-up needed if patient is
low-risk. Non-contrast chest CT can be considered in 12 months if
patient is high-risk. This recommendation follows the consensus
statement: Guidelines for Management of Incidental Pulmonary Nodules
Detected on CT Images: From the [HOSPITAL] [T1]; Radiology
[T1]; [DATE].
3. Minimal changes of COPD with paraseptal and centrilobular
emphysema.
4. Cholelithiasis.
5. 1.6 cm left adrenal adenoma.
6. Mild calcific aortic atherosclerosis.

Aortic Atherosclerosis ([T1]-[T1]) and Emphysema ([T1]-[T1]).

## 2018-11-15 ENCOUNTER — Ambulatory Visit
Admission: RE | Admit: 2018-11-15 | Discharge: 2018-11-15 | Disposition: A | Discharge status: Home / Self Care | Payer: BLUE CROSS/BLUE SHIELD | Source: Ambulatory Visit | Attending: Family Medicine | Admitting: Family Medicine

## 2018-11-15 DIAGNOSIS — R2232 Localized swelling, mass and lump, left upper limb: Secondary | ICD-10-CM

## 2018-11-15 DIAGNOSIS — R928 Other abnormal and inconclusive findings on diagnostic imaging of breast: Secondary | ICD-10-CM | POA: Diagnosis not present

## 2018-11-15 DIAGNOSIS — N6489 Other specified disorders of breast: Secondary | ICD-10-CM | POA: Diagnosis not present

## 2018-11-15 IMAGING — US US BREAST*L* LIMITED INC AXILLA
1 series · 13 of 13 positions shown · non-contrast
Comparison: Previous exam(s).

CLINICAL DATA: Patient returns today to evaluate a possible mass in
the LEFT axilla identified on recent screening mammogram.

EXAM:
ULTRASOUND OF THE LEFT AXILLA

[Series 1: us breast*left* limited inc axilla · 0.08mm/px · 13 of 13 slices shown]
[im 1/13]
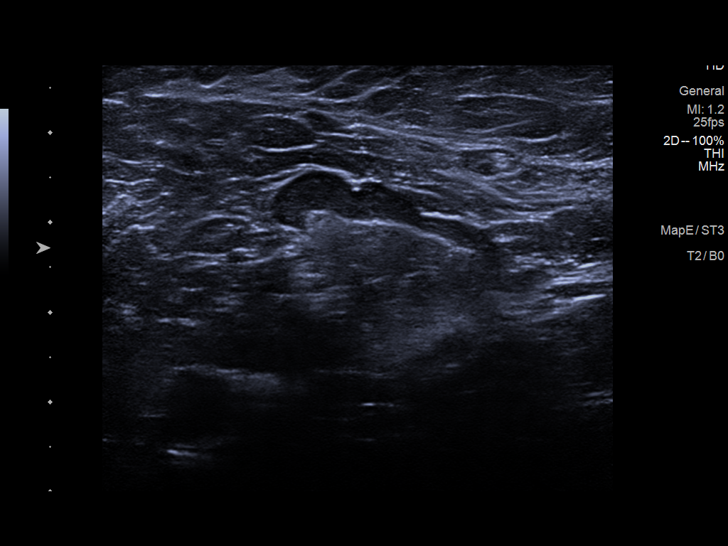
[im 2/13]
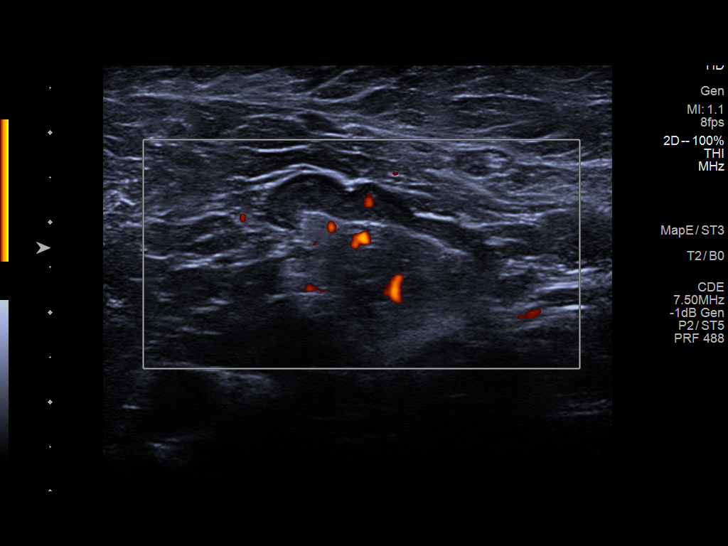
[im 3/13]
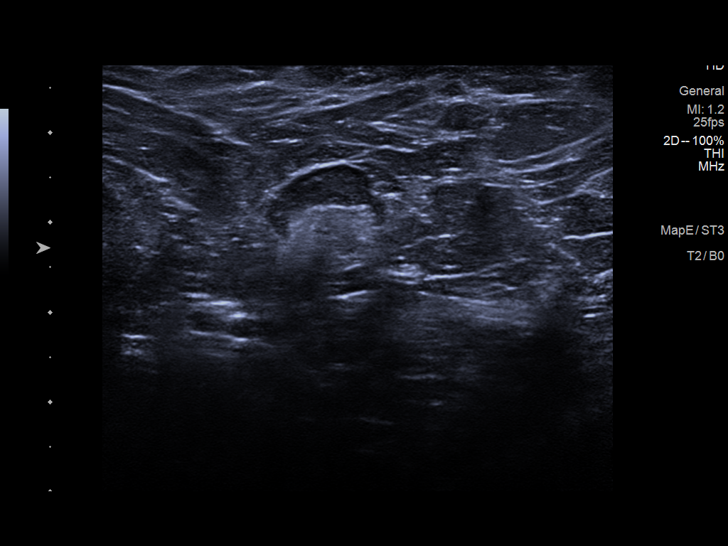
[im 4/13]
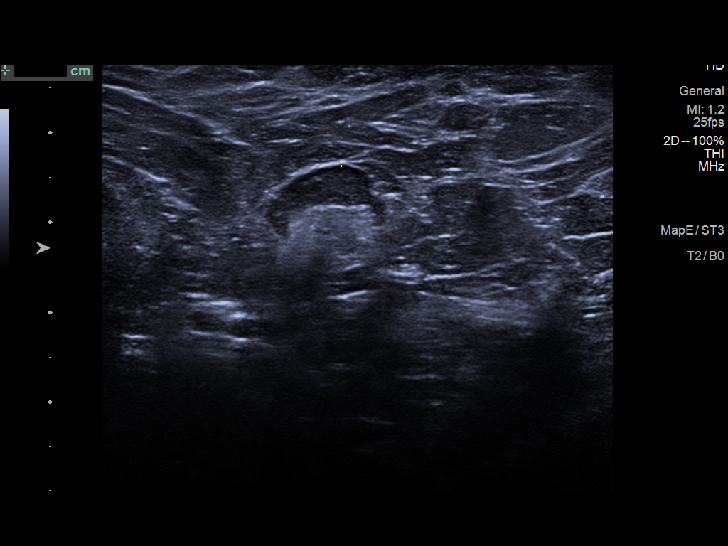
[im 5/13]
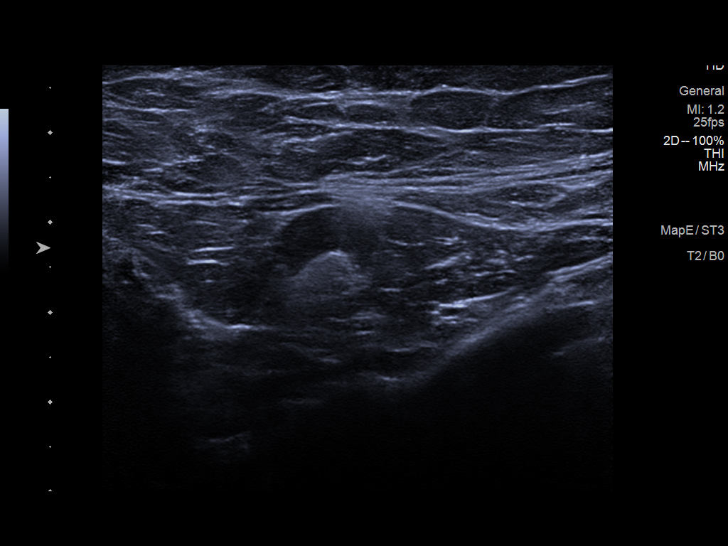
[im 6/13]
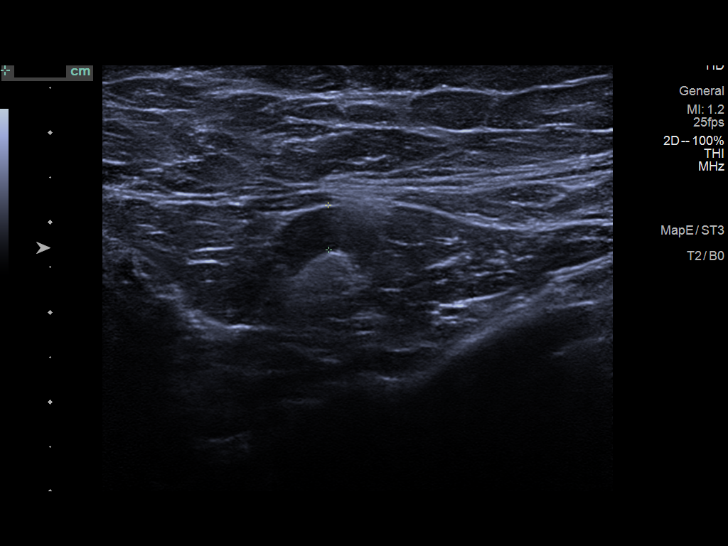
[im 7/13]
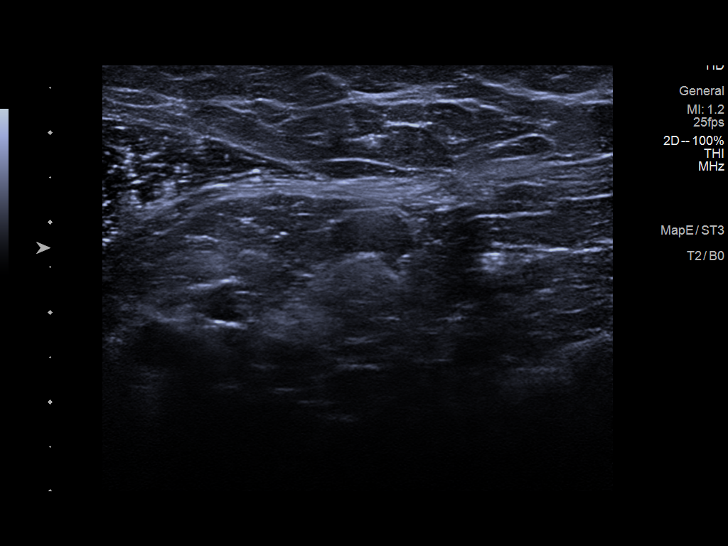
[im 8/13]
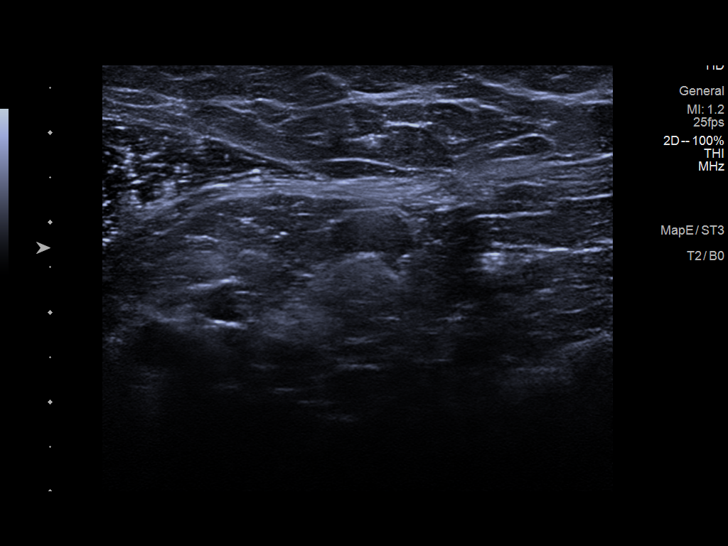
[im 9/13]
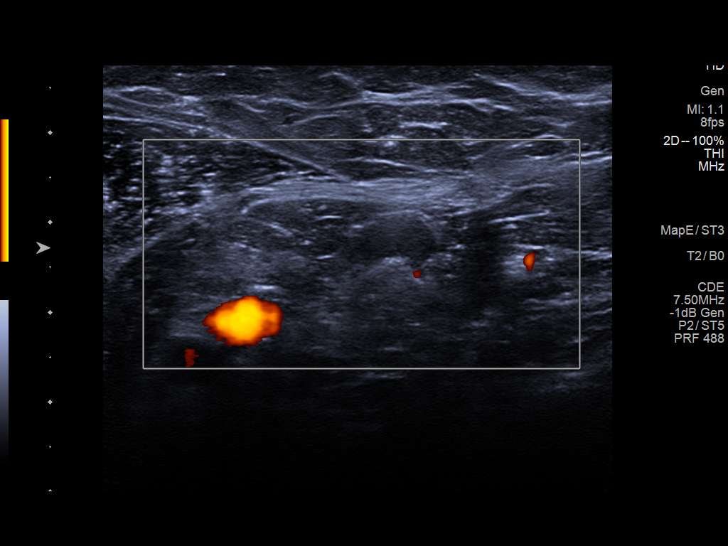
[im 10/13]
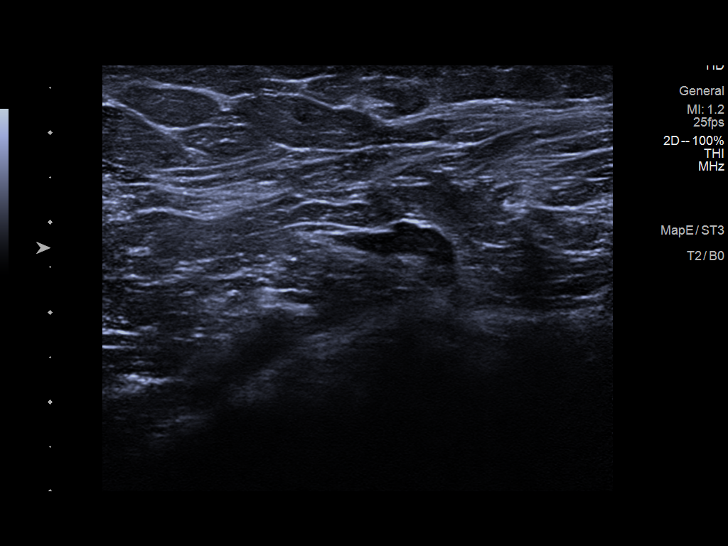
[im 11/13]
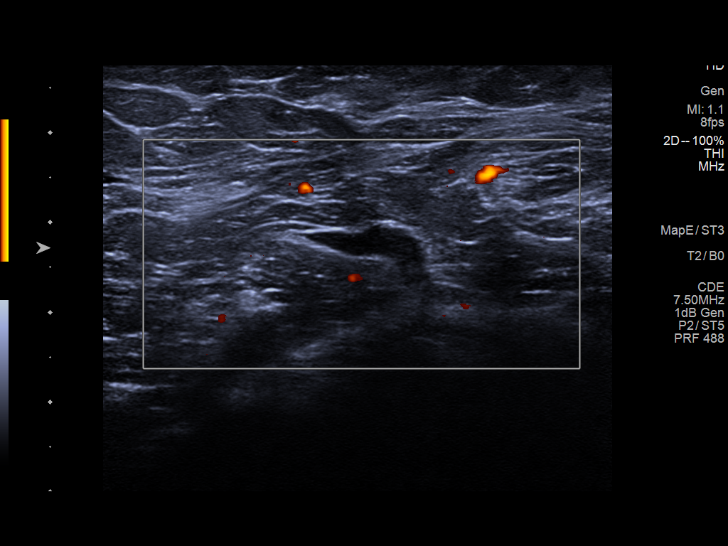
[im 12/13]
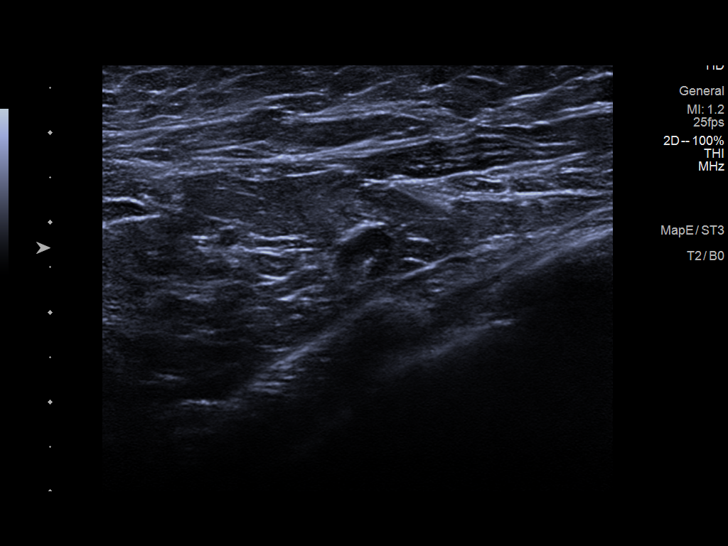
[im 13/13]
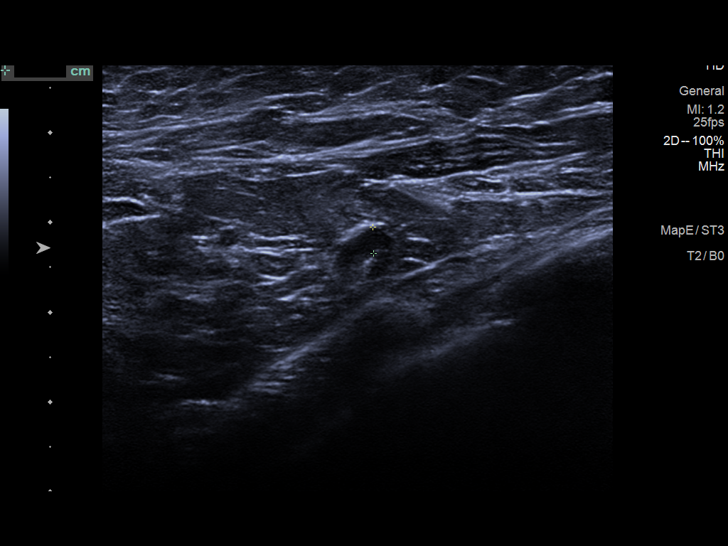

[13 of 13 positions shown; findings below may reference images not displayed]

FINDINGS: Targeted ultrasound is performed, showing a mildly prominent lymph
node in the LEFT axilla, with borderline cortical thickness of 4-5
mm. Additional normal appearing lymph nodes are identified in the
LEFT axilla. No suspicious solid or cystic mass is identified in the
LEFT axilla.
IMPRESSION: Mildly prominent lymph node in the LEFT axilla, with borderline
cortical thickness measuring 4-5 mm. Recommend follow-up LEFT
axillary ultrasound in 6 months to ensure stability or decreased
cortical thickness.

RECOMMENDATION:
LEFT axillary ultrasound in 6 months.

I have discussed the findings and recommendations with the patient.
Results were also provided in writing at the conclusion of the
visit. If applicable, a reminder letter will be sent to the patient
regarding the next appointment.

BI-RADS CATEGORY  3: Probably benign.

## 2018-11-16 ENCOUNTER — Other Ambulatory Visit: Payer: Self-pay | Admitting: Family Medicine

## 2018-11-16 DIAGNOSIS — R928 Other abnormal and inconclusive findings on diagnostic imaging of breast: Secondary | ICD-10-CM

## 2018-11-23 ENCOUNTER — Encounter: Payer: Self-pay | Admitting: Pulmonary Disease

## 2018-11-23 DIAGNOSIS — Z9989 Dependence on other enabling machines and devices: Secondary | ICD-10-CM | POA: Diagnosis not present

## 2018-11-23 DIAGNOSIS — G4733 Obstructive sleep apnea (adult) (pediatric): Secondary | ICD-10-CM | POA: Diagnosis not present

## 2018-12-02 DIAGNOSIS — M9903 Segmental and somatic dysfunction of lumbar region: Secondary | ICD-10-CM | POA: Diagnosis not present

## 2018-12-02 DIAGNOSIS — M9905 Segmental and somatic dysfunction of pelvic region: Secondary | ICD-10-CM | POA: Diagnosis not present

## 2018-12-02 DIAGNOSIS — M5136 Other intervertebral disc degeneration, lumbar region: Secondary | ICD-10-CM | POA: Diagnosis not present

## 2018-12-02 DIAGNOSIS — M5432 Sciatica, left side: Secondary | ICD-10-CM | POA: Diagnosis not present

## 2019-01-03 DIAGNOSIS — M9905 Segmental and somatic dysfunction of pelvic region: Secondary | ICD-10-CM | POA: Diagnosis not present

## 2019-01-03 DIAGNOSIS — M9903 Segmental and somatic dysfunction of lumbar region: Secondary | ICD-10-CM | POA: Diagnosis not present

## 2019-01-03 DIAGNOSIS — M5136 Other intervertebral disc degeneration, lumbar region: Secondary | ICD-10-CM | POA: Diagnosis not present

## 2019-01-03 DIAGNOSIS — M5432 Sciatica, left side: Secondary | ICD-10-CM | POA: Diagnosis not present

## 2019-01-16 DIAGNOSIS — K219 Gastro-esophageal reflux disease without esophagitis: Secondary | ICD-10-CM | POA: Diagnosis not present

## 2019-02-07 DIAGNOSIS — M9905 Segmental and somatic dysfunction of pelvic region: Secondary | ICD-10-CM | POA: Diagnosis not present

## 2019-02-07 DIAGNOSIS — M9903 Segmental and somatic dysfunction of lumbar region: Secondary | ICD-10-CM | POA: Diagnosis not present

## 2019-02-07 DIAGNOSIS — M5136 Other intervertebral disc degeneration, lumbar region: Secondary | ICD-10-CM | POA: Diagnosis not present

## 2019-02-07 DIAGNOSIS — M5432 Sciatica, left side: Secondary | ICD-10-CM | POA: Diagnosis not present

## 2019-02-10 DIAGNOSIS — M9903 Segmental and somatic dysfunction of lumbar region: Secondary | ICD-10-CM | POA: Diagnosis not present

## 2019-02-10 DIAGNOSIS — M5136 Other intervertebral disc degeneration, lumbar region: Secondary | ICD-10-CM | POA: Diagnosis not present

## 2019-02-10 DIAGNOSIS — M9905 Segmental and somatic dysfunction of pelvic region: Secondary | ICD-10-CM | POA: Diagnosis not present

## 2019-02-10 DIAGNOSIS — M5432 Sciatica, left side: Secondary | ICD-10-CM | POA: Diagnosis not present

## 2019-02-20 DIAGNOSIS — K0825 Moderate atrophy of the maxilla: Secondary | ICD-10-CM | POA: Diagnosis not present

## 2019-02-20 DIAGNOSIS — K048 Radicular cyst: Secondary | ICD-10-CM | POA: Diagnosis not present

## 2019-02-24 DIAGNOSIS — M5136 Other intervertebral disc degeneration, lumbar region: Secondary | ICD-10-CM | POA: Diagnosis not present

## 2019-02-24 DIAGNOSIS — M9905 Segmental and somatic dysfunction of pelvic region: Secondary | ICD-10-CM | POA: Diagnosis not present

## 2019-02-24 DIAGNOSIS — M9903 Segmental and somatic dysfunction of lumbar region: Secondary | ICD-10-CM | POA: Diagnosis not present

## 2019-02-24 DIAGNOSIS — M5432 Sciatica, left side: Secondary | ICD-10-CM | POA: Diagnosis not present

## 2019-03-08 DIAGNOSIS — G4733 Obstructive sleep apnea (adult) (pediatric): Secondary | ICD-10-CM | POA: Diagnosis not present

## 2019-03-10 DIAGNOSIS — M9903 Segmental and somatic dysfunction of lumbar region: Secondary | ICD-10-CM | POA: Diagnosis not present

## 2019-03-10 DIAGNOSIS — M5136 Other intervertebral disc degeneration, lumbar region: Secondary | ICD-10-CM | POA: Diagnosis not present

## 2019-03-10 DIAGNOSIS — M5432 Sciatica, left side: Secondary | ICD-10-CM | POA: Diagnosis not present

## 2019-03-10 DIAGNOSIS — M9905 Segmental and somatic dysfunction of pelvic region: Secondary | ICD-10-CM | POA: Diagnosis not present

## 2019-03-31 DIAGNOSIS — M5432 Sciatica, left side: Secondary | ICD-10-CM | POA: Diagnosis not present

## 2019-03-31 DIAGNOSIS — M5136 Other intervertebral disc degeneration, lumbar region: Secondary | ICD-10-CM | POA: Diagnosis not present

## 2019-03-31 DIAGNOSIS — M9903 Segmental and somatic dysfunction of lumbar region: Secondary | ICD-10-CM | POA: Diagnosis not present

## 2019-03-31 DIAGNOSIS — M9905 Segmental and somatic dysfunction of pelvic region: Secondary | ICD-10-CM | POA: Diagnosis not present

## 2019-04-03 DIAGNOSIS — M5136 Other intervertebral disc degeneration, lumbar region: Secondary | ICD-10-CM | POA: Diagnosis not present

## 2019-04-03 DIAGNOSIS — M9903 Segmental and somatic dysfunction of lumbar region: Secondary | ICD-10-CM | POA: Diagnosis not present

## 2019-04-03 DIAGNOSIS — M5432 Sciatica, left side: Secondary | ICD-10-CM | POA: Diagnosis not present

## 2019-04-03 DIAGNOSIS — M9905 Segmental and somatic dysfunction of pelvic region: Secondary | ICD-10-CM | POA: Diagnosis not present

## 2019-04-05 DIAGNOSIS — E039 Hypothyroidism, unspecified: Secondary | ICD-10-CM | POA: Diagnosis not present

## 2019-04-05 DIAGNOSIS — E278 Other specified disorders of adrenal gland: Secondary | ICD-10-CM | POA: Diagnosis not present

## 2019-04-05 DIAGNOSIS — Z0000 Encounter for general adult medical examination without abnormal findings: Secondary | ICD-10-CM | POA: Diagnosis not present

## 2019-04-07 DIAGNOSIS — M5136 Other intervertebral disc degeneration, lumbar region: Secondary | ICD-10-CM | POA: Diagnosis not present

## 2019-04-07 DIAGNOSIS — M9903 Segmental and somatic dysfunction of lumbar region: Secondary | ICD-10-CM | POA: Diagnosis not present

## 2019-04-07 DIAGNOSIS — M5432 Sciatica, left side: Secondary | ICD-10-CM | POA: Diagnosis not present

## 2019-04-07 DIAGNOSIS — M9905 Segmental and somatic dysfunction of pelvic region: Secondary | ICD-10-CM | POA: Diagnosis not present

## 2019-04-21 DIAGNOSIS — M9903 Segmental and somatic dysfunction of lumbar region: Secondary | ICD-10-CM | POA: Diagnosis not present

## 2019-04-21 DIAGNOSIS — M5136 Other intervertebral disc degeneration, lumbar region: Secondary | ICD-10-CM | POA: Diagnosis not present

## 2019-04-21 DIAGNOSIS — M5432 Sciatica, left side: Secondary | ICD-10-CM | POA: Diagnosis not present

## 2019-04-21 DIAGNOSIS — M9905 Segmental and somatic dysfunction of pelvic region: Secondary | ICD-10-CM | POA: Diagnosis not present

## 2019-04-27 ENCOUNTER — Ambulatory Visit
Admission: RE | Admit: 2019-04-27 | Discharge: 2019-04-27 | Disposition: A | Discharge status: Home / Self Care | Payer: BLUE CROSS/BLUE SHIELD | Attending: Family Medicine | Admitting: Family Medicine

## 2019-04-27 ENCOUNTER — Other Ambulatory Visit: Payer: Self-pay

## 2019-04-27 ENCOUNTER — Other Ambulatory Visit: Payer: Self-pay | Admitting: Family Medicine

## 2019-04-27 ENCOUNTER — Ambulatory Visit
Admission: RE | Admit: 2019-04-27 | Discharge: 2019-04-27 | Disposition: A | Discharge status: Home / Self Care | Payer: BLUE CROSS/BLUE SHIELD | Source: Ambulatory Visit | Attending: Family Medicine | Admitting: Family Medicine

## 2019-04-27 DIAGNOSIS — L301 Dyshidrosis [pompholyx]: Secondary | ICD-10-CM | POA: Diagnosis not present

## 2019-04-27 DIAGNOSIS — M25572 Pain in left ankle and joints of left foot: Secondary | ICD-10-CM

## 2019-04-27 DIAGNOSIS — M79672 Pain in left foot: Secondary | ICD-10-CM | POA: Diagnosis not present

## 2019-04-27 IMAGING — CR LEFT ANKLE COMPLETE - 3+ VIEW
3 series · 3 of 3 positions shown · non-contrast
Comparison: None.

CLINICAL DATA: Left foot and ankle pain.

EXAM:
LEFT ANKLE COMPLETE - 3+ VIEW

[ankle ap]
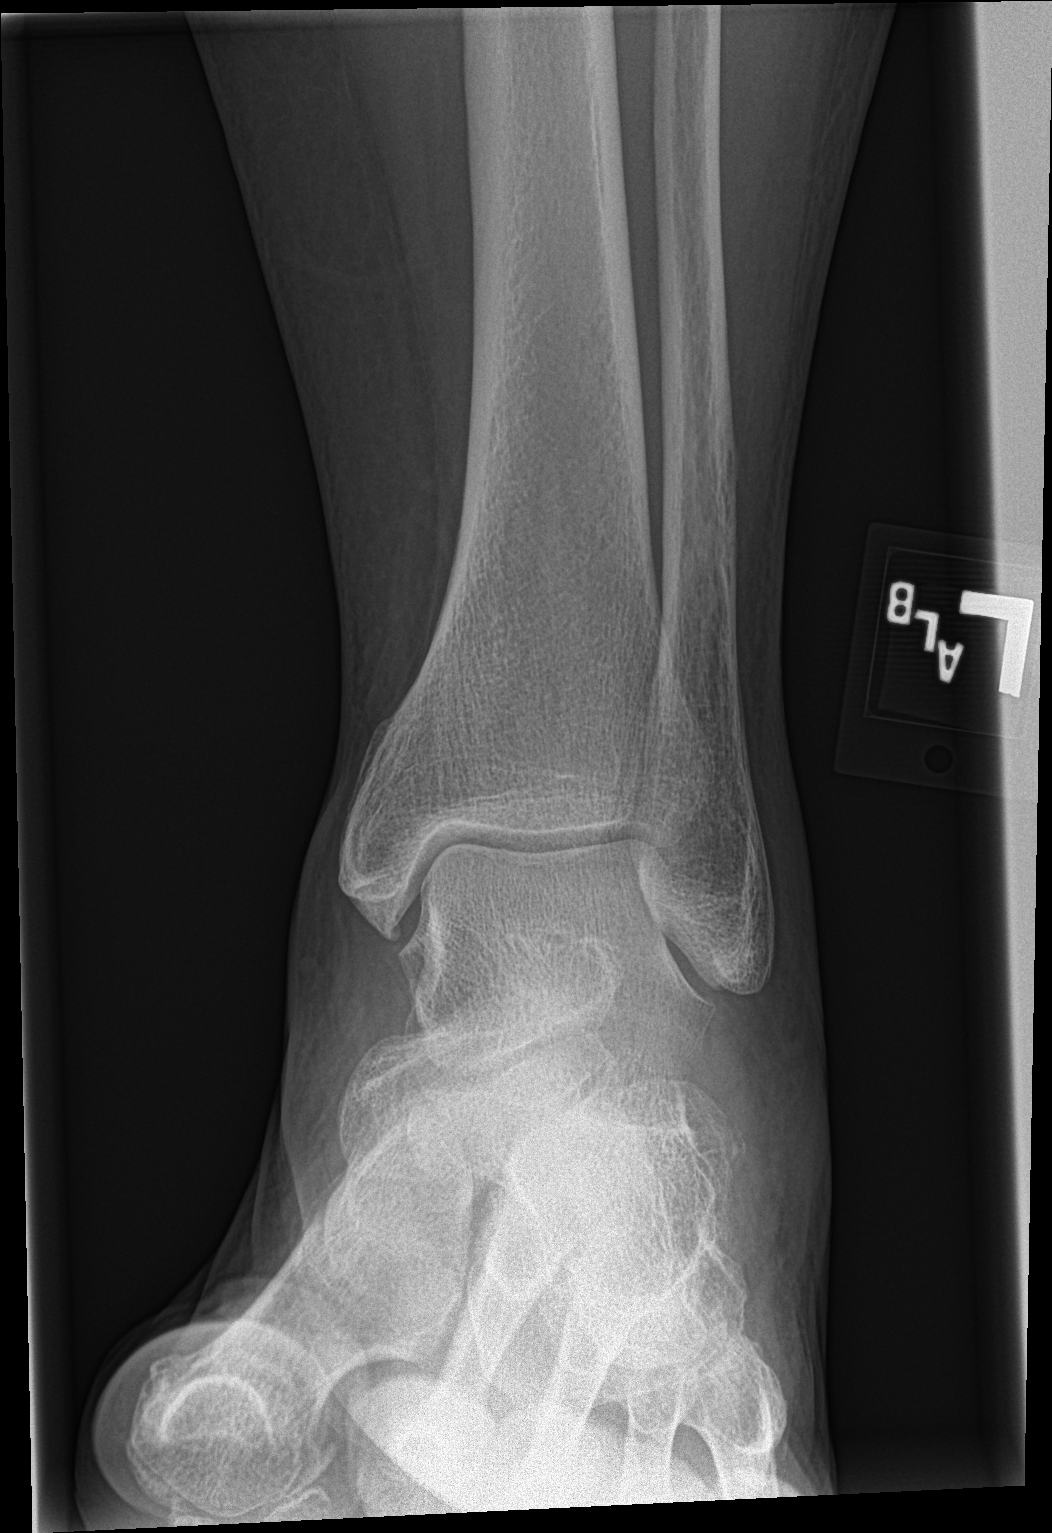

[ankle obl]
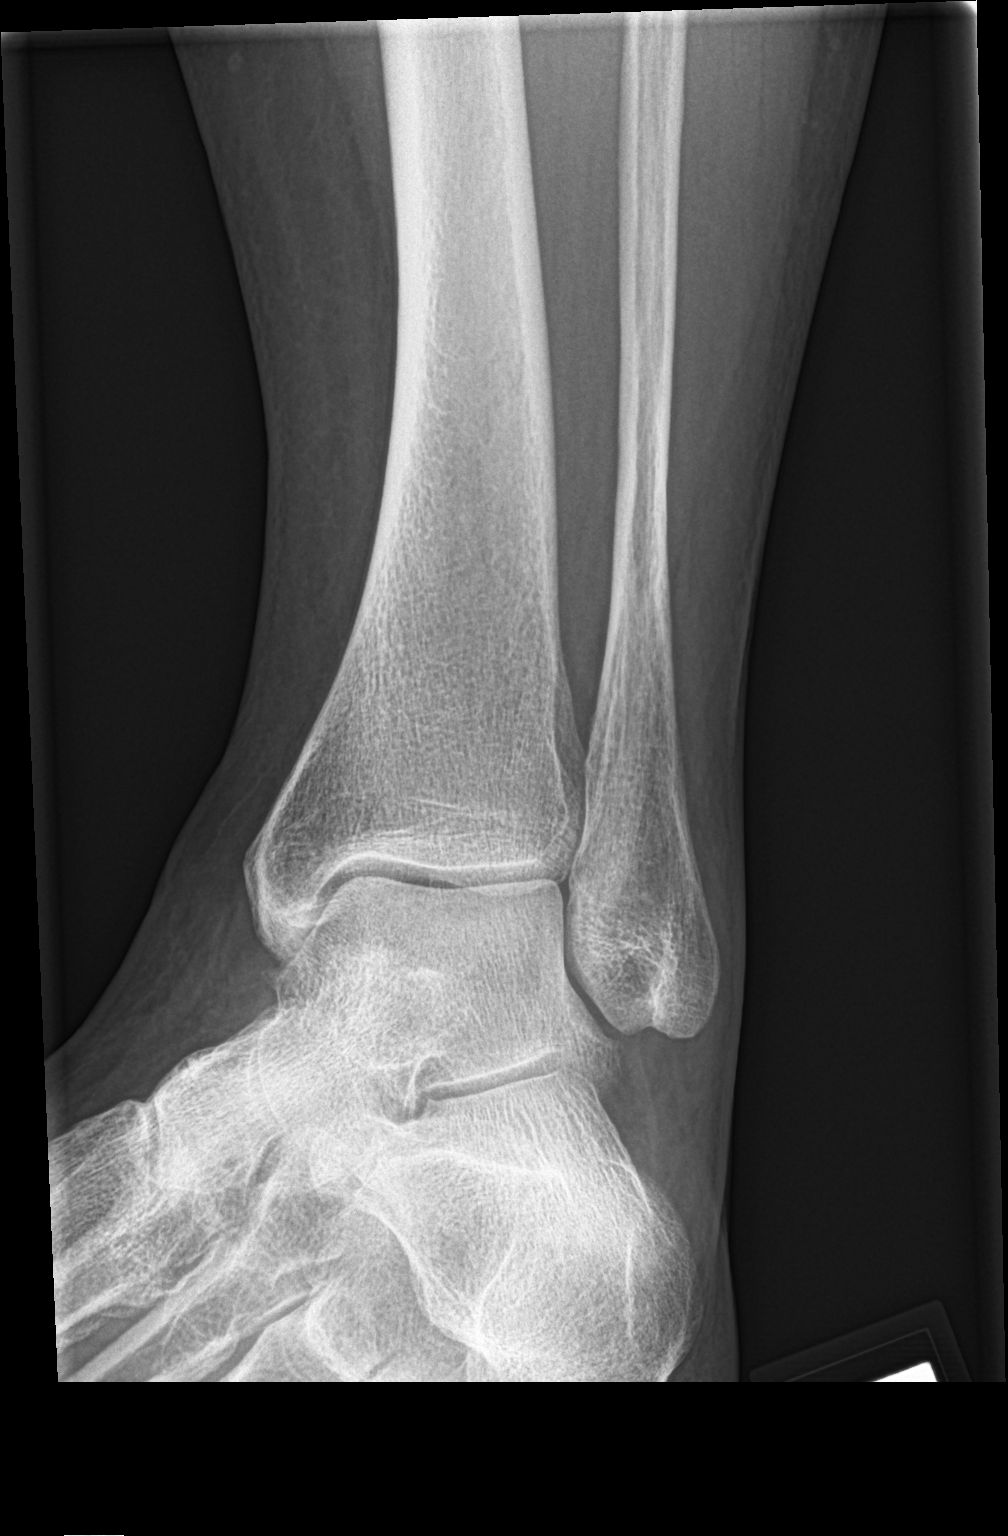

[ankle lat]
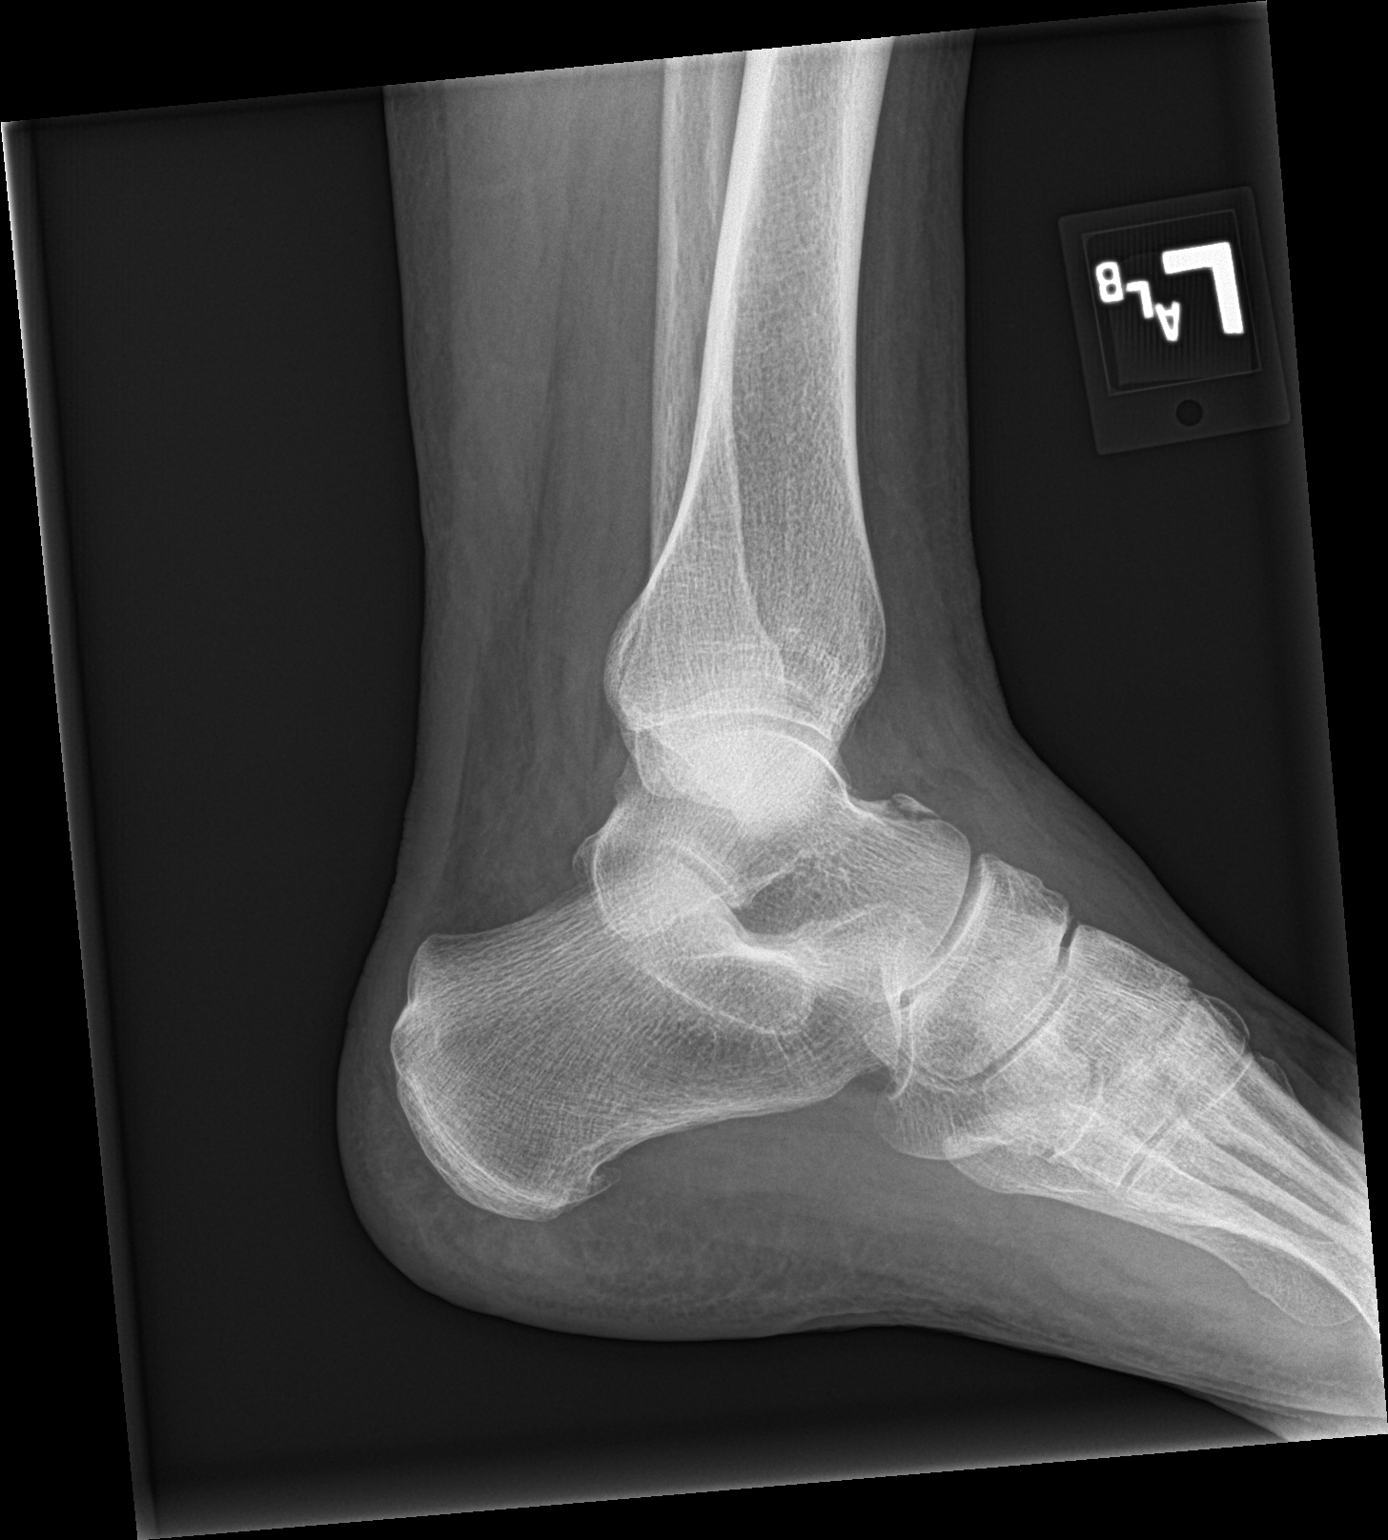

[3 of 3 positions shown; findings below may reference images not displayed]

FINDINGS: There is no evidence of fracture, dislocation, or joint effusion.
There is no evidence of arthropathy or other focal bone abnormality.
Soft tissues are unremarkable.
IMPRESSION: Negative.

## 2019-04-27 IMAGING — CR LEFT FOOT - COMPLETE 3+ VIEW
3 series · 3 of 3 positions shown · non-contrast
Comparison: None.

CLINICAL DATA: Left foot and ankle pain.

EXAM:
LEFT FOOT - COMPLETE 3+ VIEW

[foot ap]
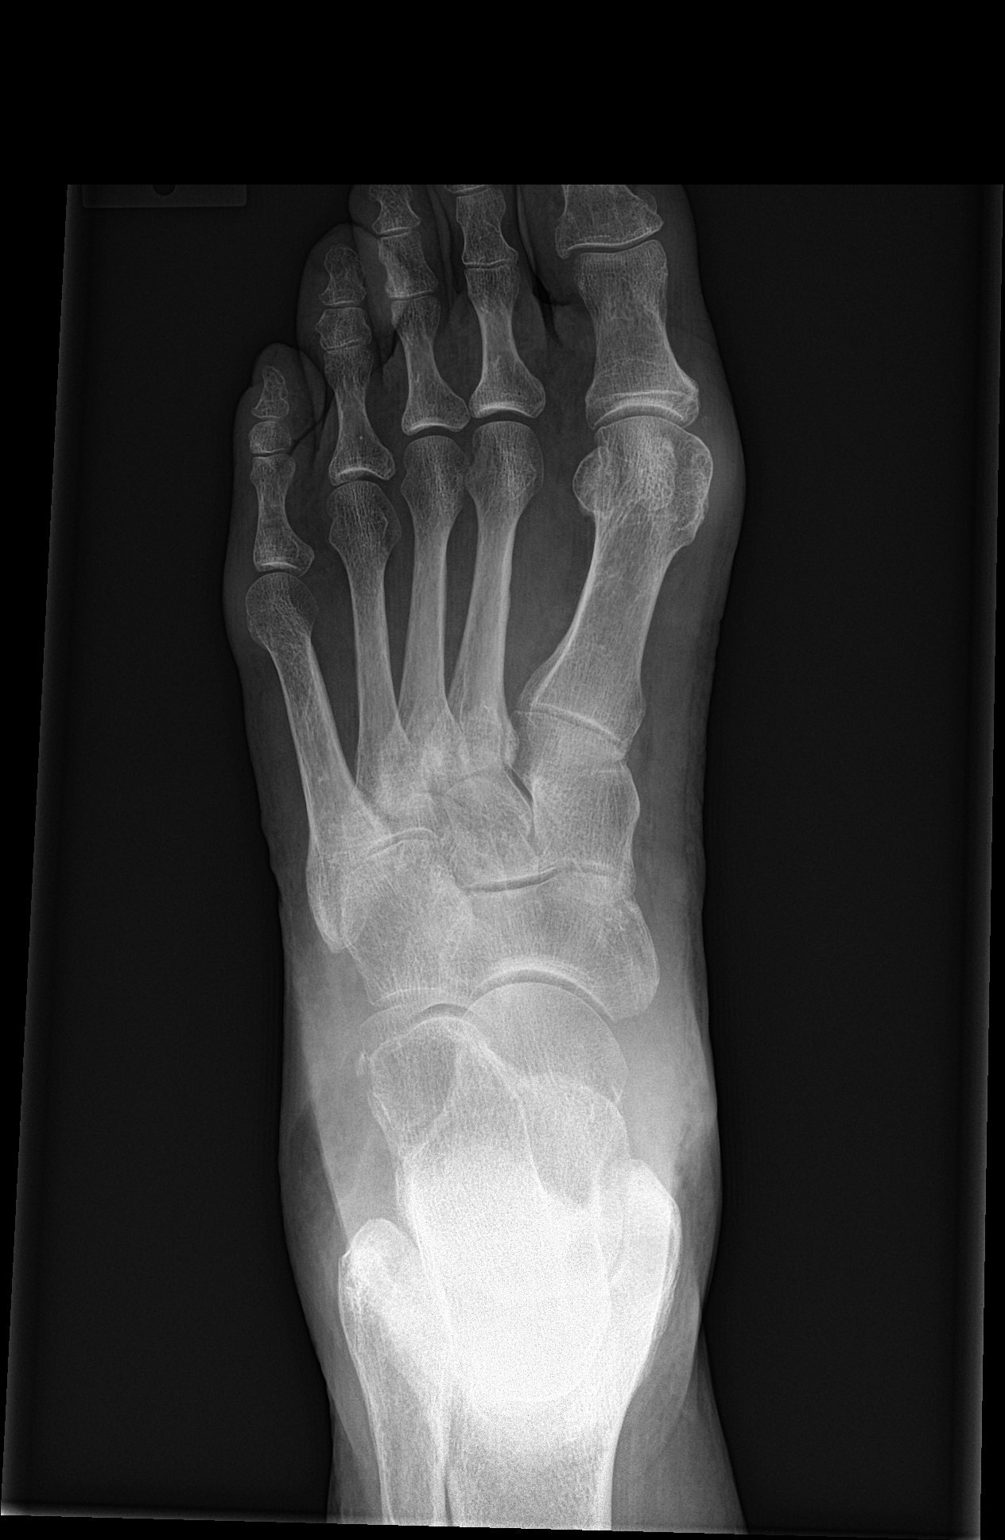

[foot obl]
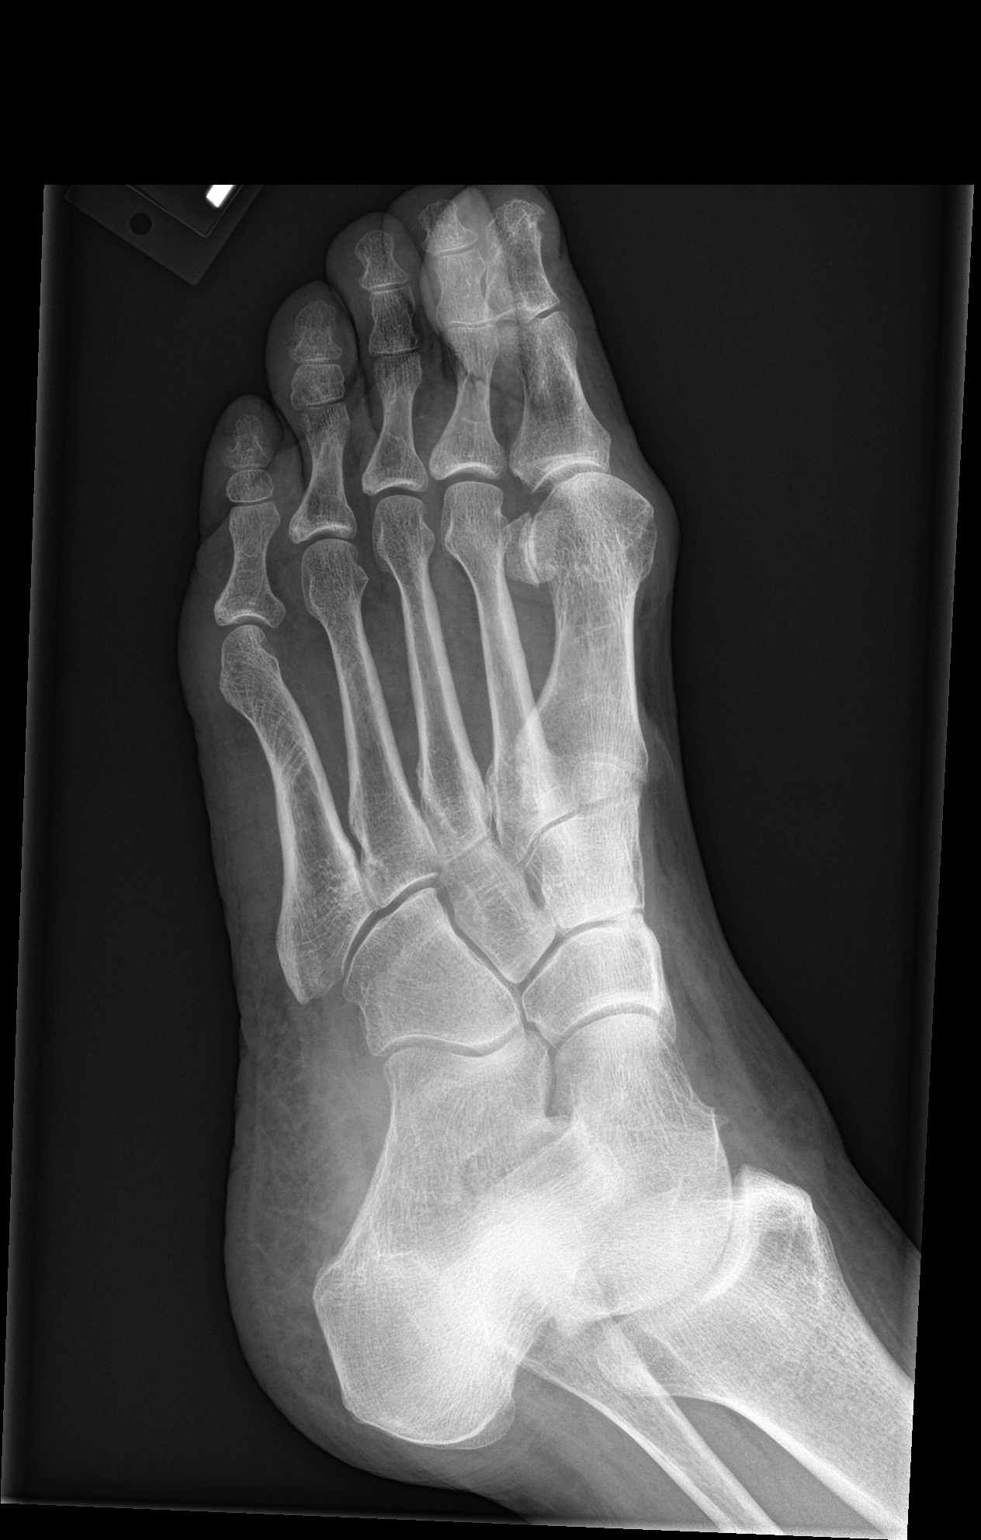

[foot lat]
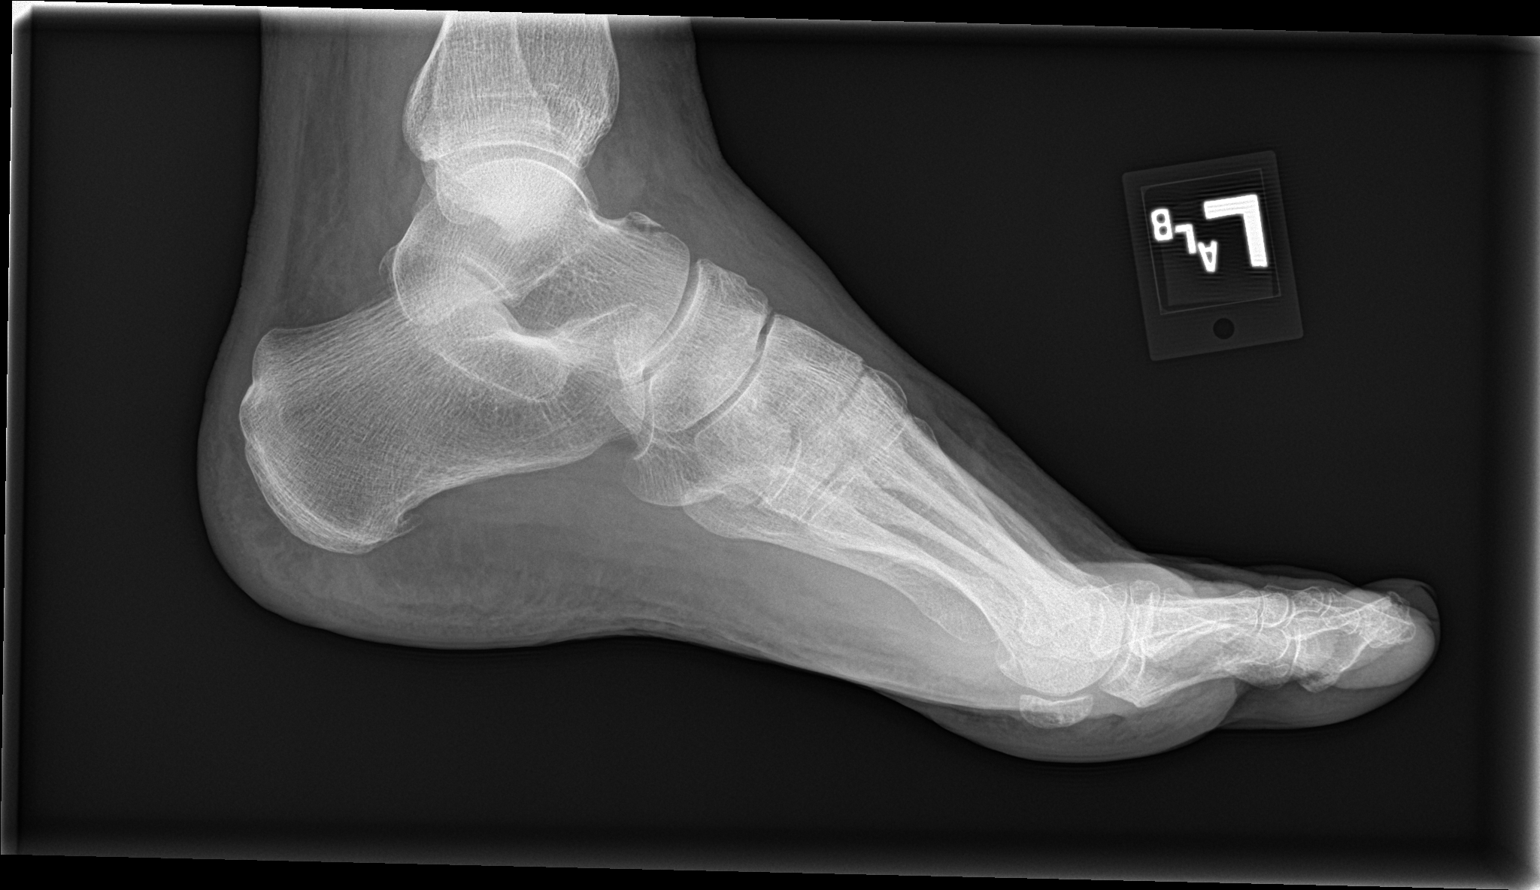

[3 of 3 positions shown; findings below may reference images not displayed]

FINDINGS: There is no evidence of fracture or dislocation. There is no
evidence of arthropathy or other focal bone abnormality. Soft
tissues are unremarkable.
IMPRESSION: Negative.

## 2019-05-19 DIAGNOSIS — M9903 Segmental and somatic dysfunction of lumbar region: Secondary | ICD-10-CM | POA: Diagnosis not present

## 2019-05-19 DIAGNOSIS — M9905 Segmental and somatic dysfunction of pelvic region: Secondary | ICD-10-CM | POA: Diagnosis not present

## 2019-05-19 DIAGNOSIS — M5136 Other intervertebral disc degeneration, lumbar region: Secondary | ICD-10-CM | POA: Diagnosis not present

## 2019-05-19 DIAGNOSIS — M5432 Sciatica, left side: Secondary | ICD-10-CM | POA: Diagnosis not present

## 2019-05-31 ENCOUNTER — Ambulatory Visit
Admission: RE | Admit: 2019-05-31 | Discharge: 2019-05-31 | Disposition: A | Discharge status: Home / Self Care | Payer: BLUE CROSS/BLUE SHIELD | Source: Ambulatory Visit | Attending: Family Medicine | Admitting: Family Medicine

## 2019-05-31 DIAGNOSIS — N6489 Other specified disorders of breast: Secondary | ICD-10-CM | POA: Diagnosis not present

## 2019-05-31 DIAGNOSIS — R928 Other abnormal and inconclusive findings on diagnostic imaging of breast: Secondary | ICD-10-CM | POA: Diagnosis not present

## 2019-05-31 IMAGING — US US BREAST*L* LIMITED INC AXILLA
1 series · 9 of 9 positions shown · non-contrast
Comparison: Previous exam(s).

CLINICAL DATA: Short-term interval follow-up of a left axillary
lymph node.

EXAM:
ULTRASOUND OF THE LEFT BREAST

[Series 1: us breast*left* limited inc axilla · 0.08mm/px · 9 of 9 slices shown]
[im 1/9]
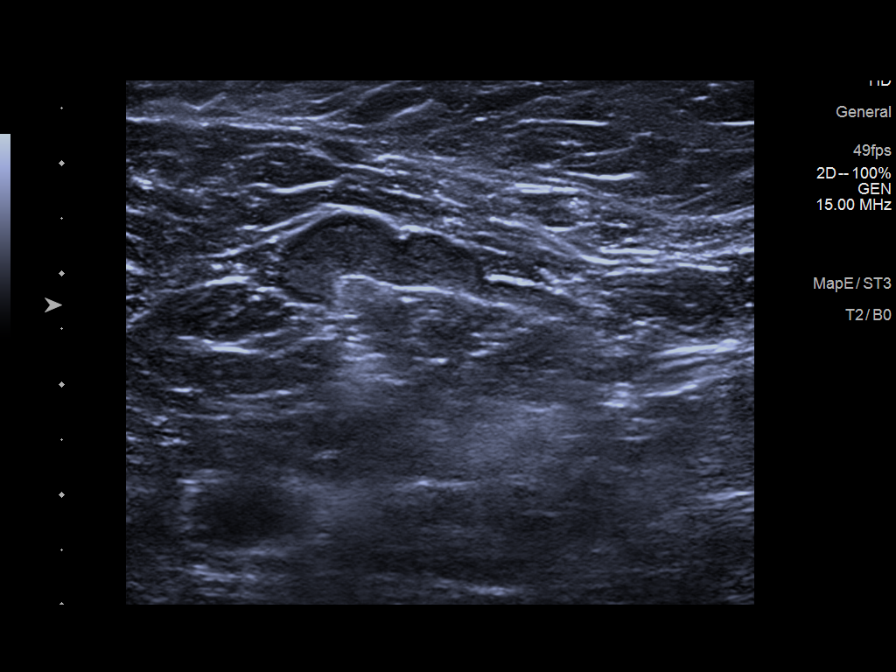
[im 2/9]
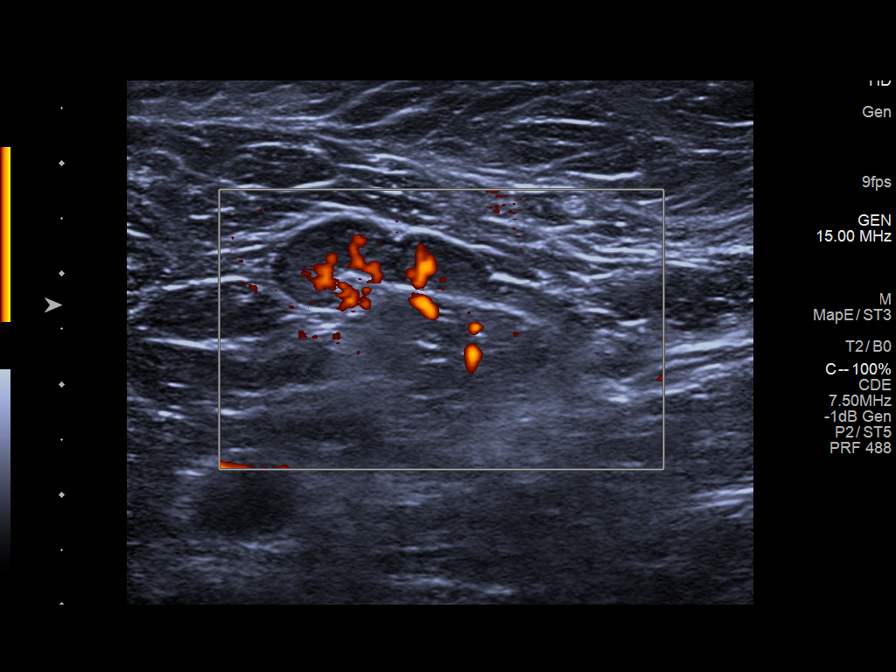
[im 3/9]
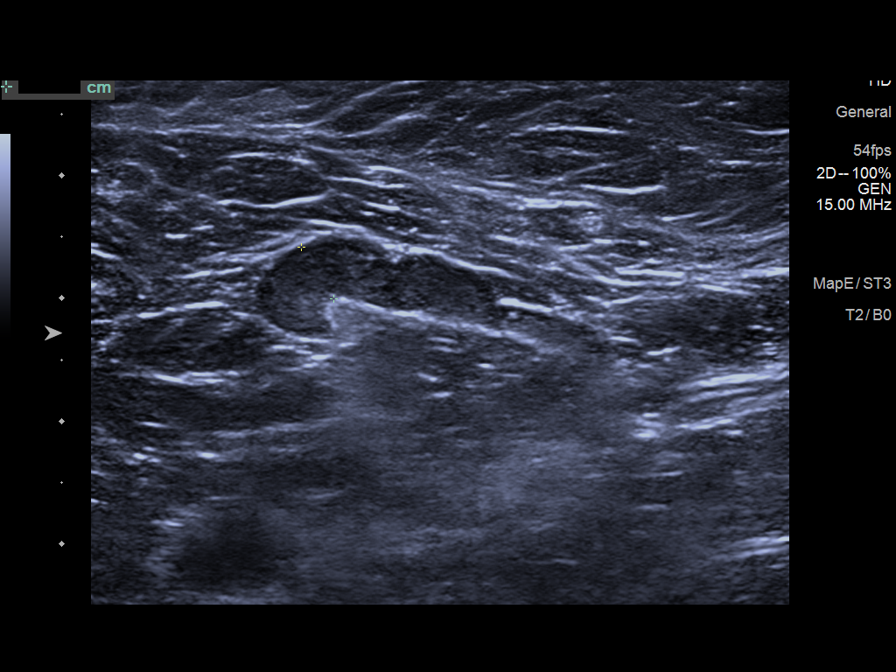
[im 4/9]
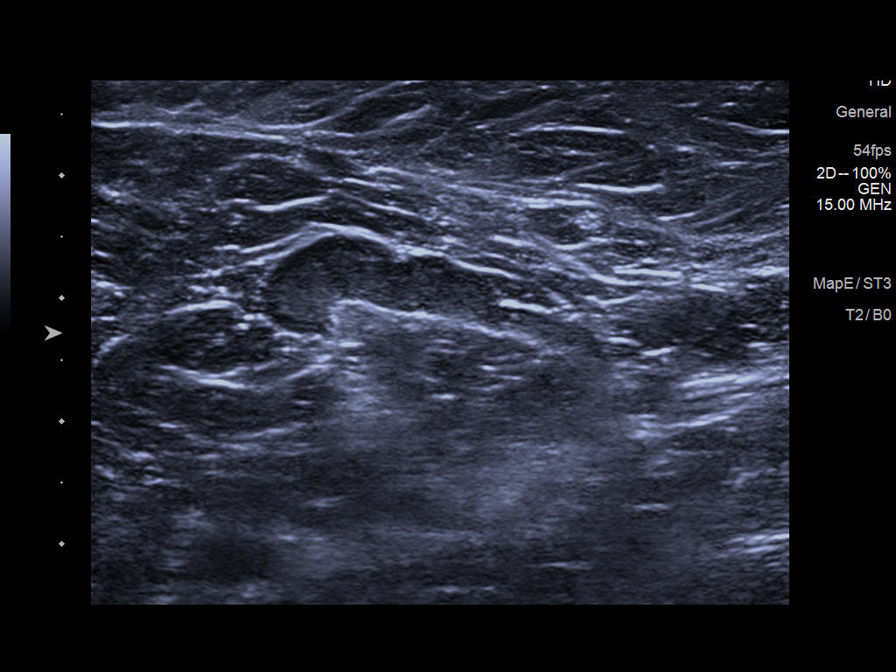
[im 5/9]
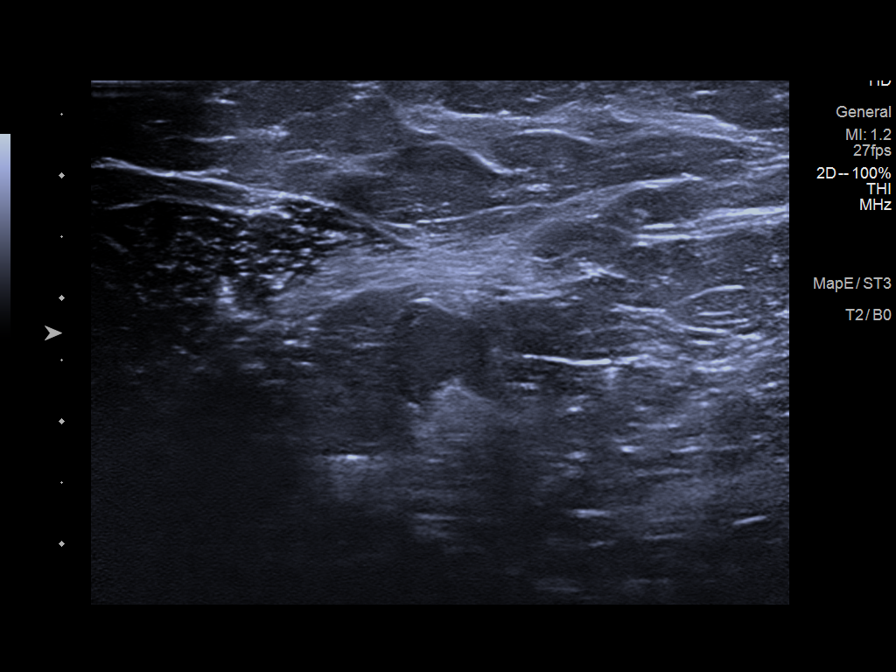
[im 6/9]
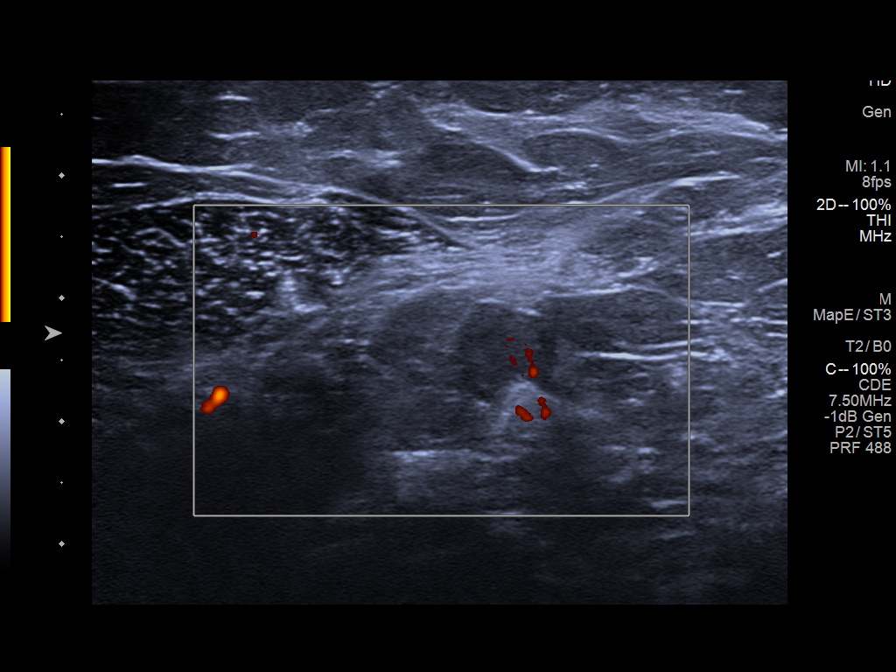
[im 7/9]
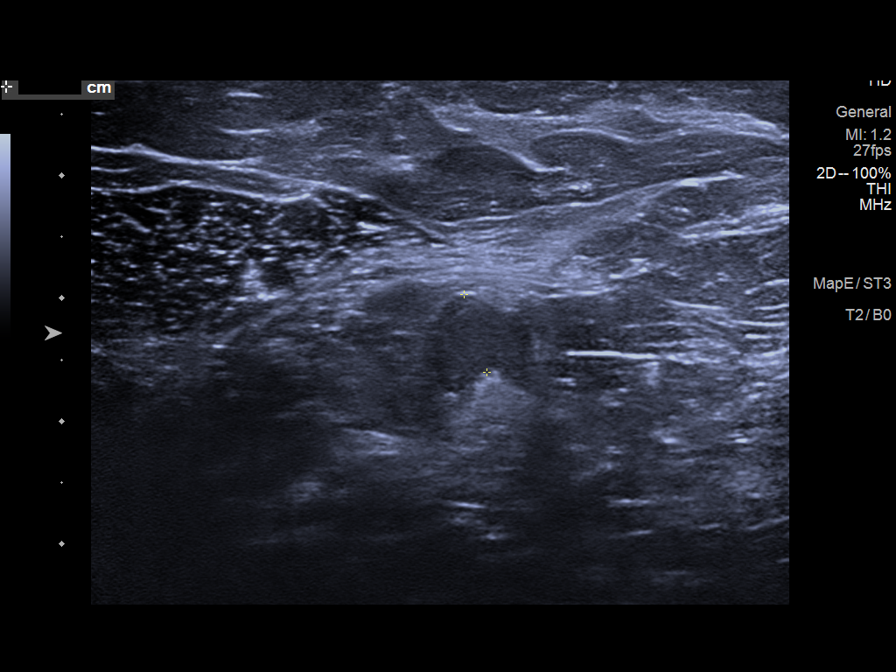
[im 8/9]
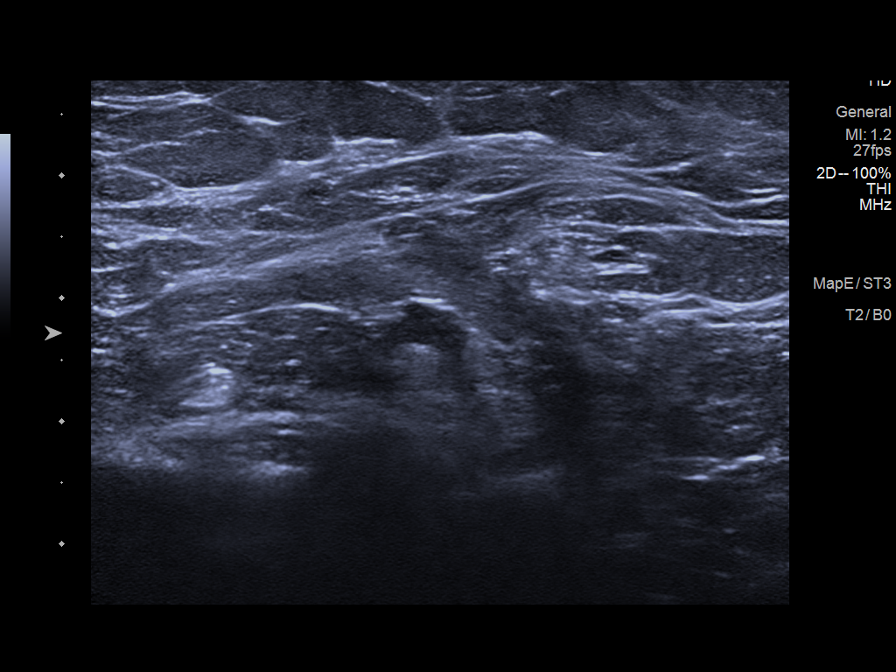
[im 9/9]
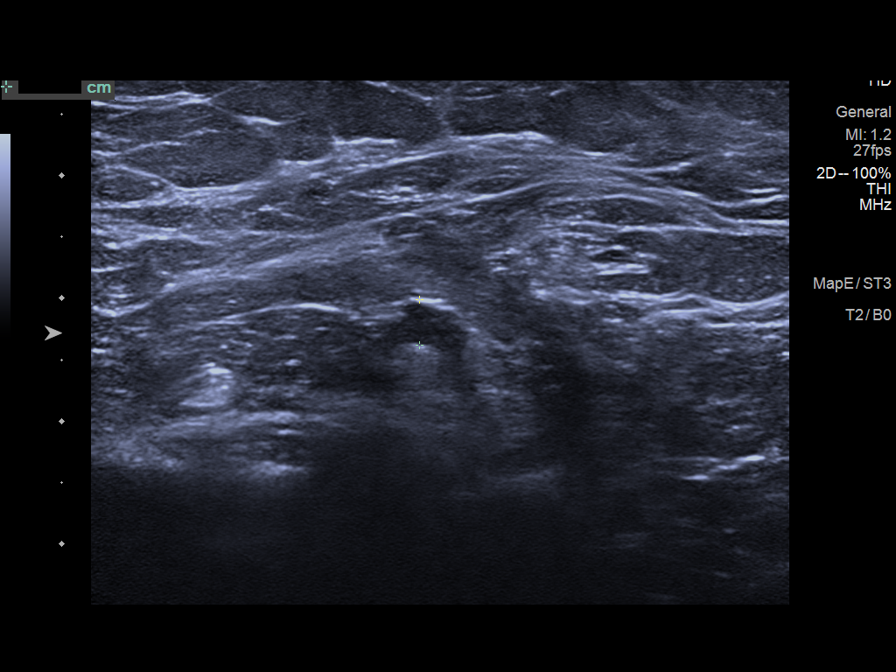

[9 of 9 positions shown; findings below may reference images not displayed]

FINDINGS: Targeted ultrasound is performed, showing 3 lymph nodes in the left
axilla. Previously described lymph node with cortex measuring 5 mm
is stable. There is an adjacent smaller lymph node with the cortex
measuring 4 mm. Previously it measured 3 mm. There is a third lymph
node with cortical thickening measuring 7 mm. Lymph nodes are
indeterminate.
IMPRESSION: Indeterminate left axillary lymph nodes.

RECOMMENDATION:
Ultrasound-guided core biopsy of the left axillary adenopathy is
recommended.

I have discussed the findings and recommendations with the patient.
If applicable, a reminder letter will be sent to the patient
regarding the next appointment.

BI-RADS CATEGORY  4: Suspicious.

## 2019-06-01 ENCOUNTER — Other Ambulatory Visit: Payer: Self-pay | Admitting: Family Medicine

## 2019-06-01 DIAGNOSIS — R928 Other abnormal and inconclusive findings on diagnostic imaging of breast: Secondary | ICD-10-CM

## 2019-06-06 ENCOUNTER — Ambulatory Visit
Admission: RE | Admit: 2019-06-06 | Discharge: 2019-06-06 | Disposition: A | Discharge status: Home / Self Care | Payer: BLUE CROSS/BLUE SHIELD | Source: Ambulatory Visit | Attending: Family Medicine | Admitting: Family Medicine

## 2019-06-06 DIAGNOSIS — R599 Enlarged lymph nodes, unspecified: Secondary | ICD-10-CM | POA: Diagnosis not present

## 2019-06-06 DIAGNOSIS — R590 Localized enlarged lymph nodes: Secondary | ICD-10-CM | POA: Diagnosis not present

## 2019-06-06 DIAGNOSIS — R928 Other abnormal and inconclusive findings on diagnostic imaging of breast: Secondary | ICD-10-CM | POA: Diagnosis not present

## 2019-06-06 DIAGNOSIS — L989 Disorder of the skin and subcutaneous tissue, unspecified: Secondary | ICD-10-CM | POA: Diagnosis not present

## 2019-06-06 HISTORY — PX: BREAST BIOPSY: SHX20

## 2019-06-06 IMAGING — MG MM BREAST LOCALIZATION CLIP
2 series · 3 of 6 positions shown · non-contrast
Comparison: Previous exam(s).

CLINICAL DATA: Status post LEFT axillary ultrasound-guided core
biopsy.

EXAM:
DIAGNOSTIC LEFT MAMMOGRAM POST ULTRASOUND BIOPSY

[L MLO synth-2D]
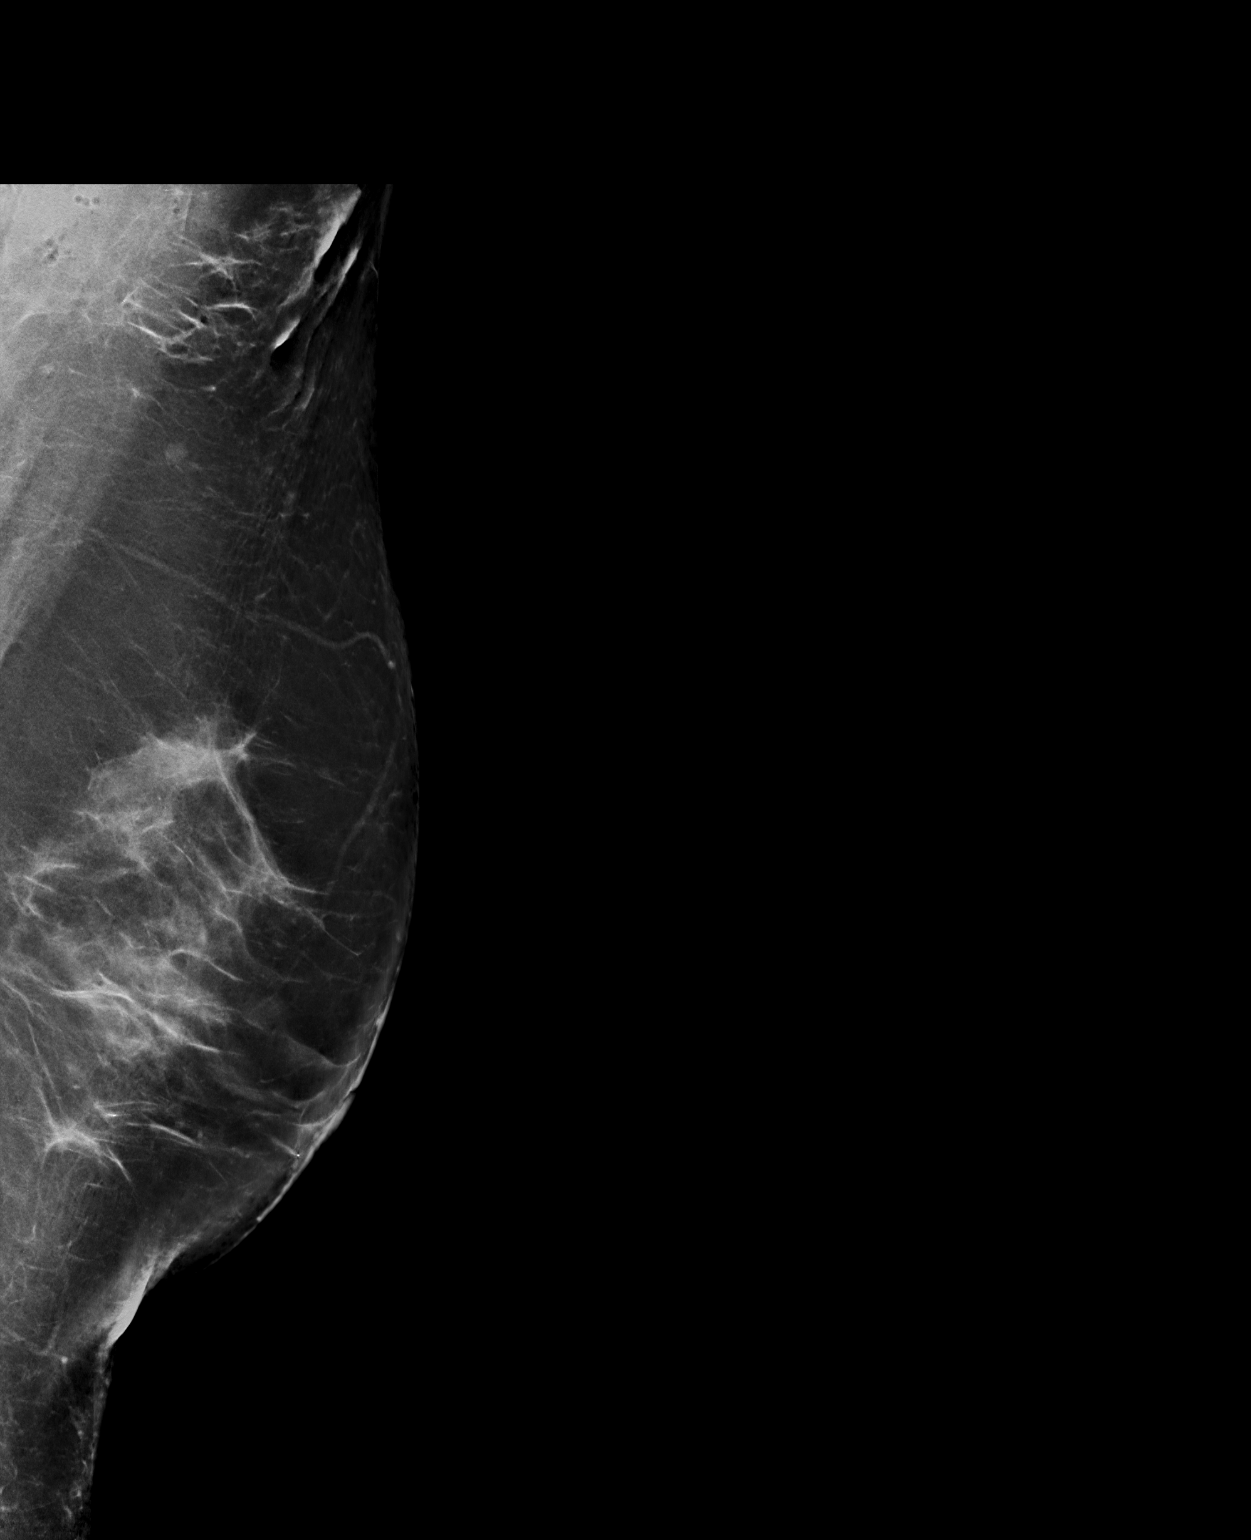

[L MLO tomo · 2 of 99 frames shown]
[frame 32/99]
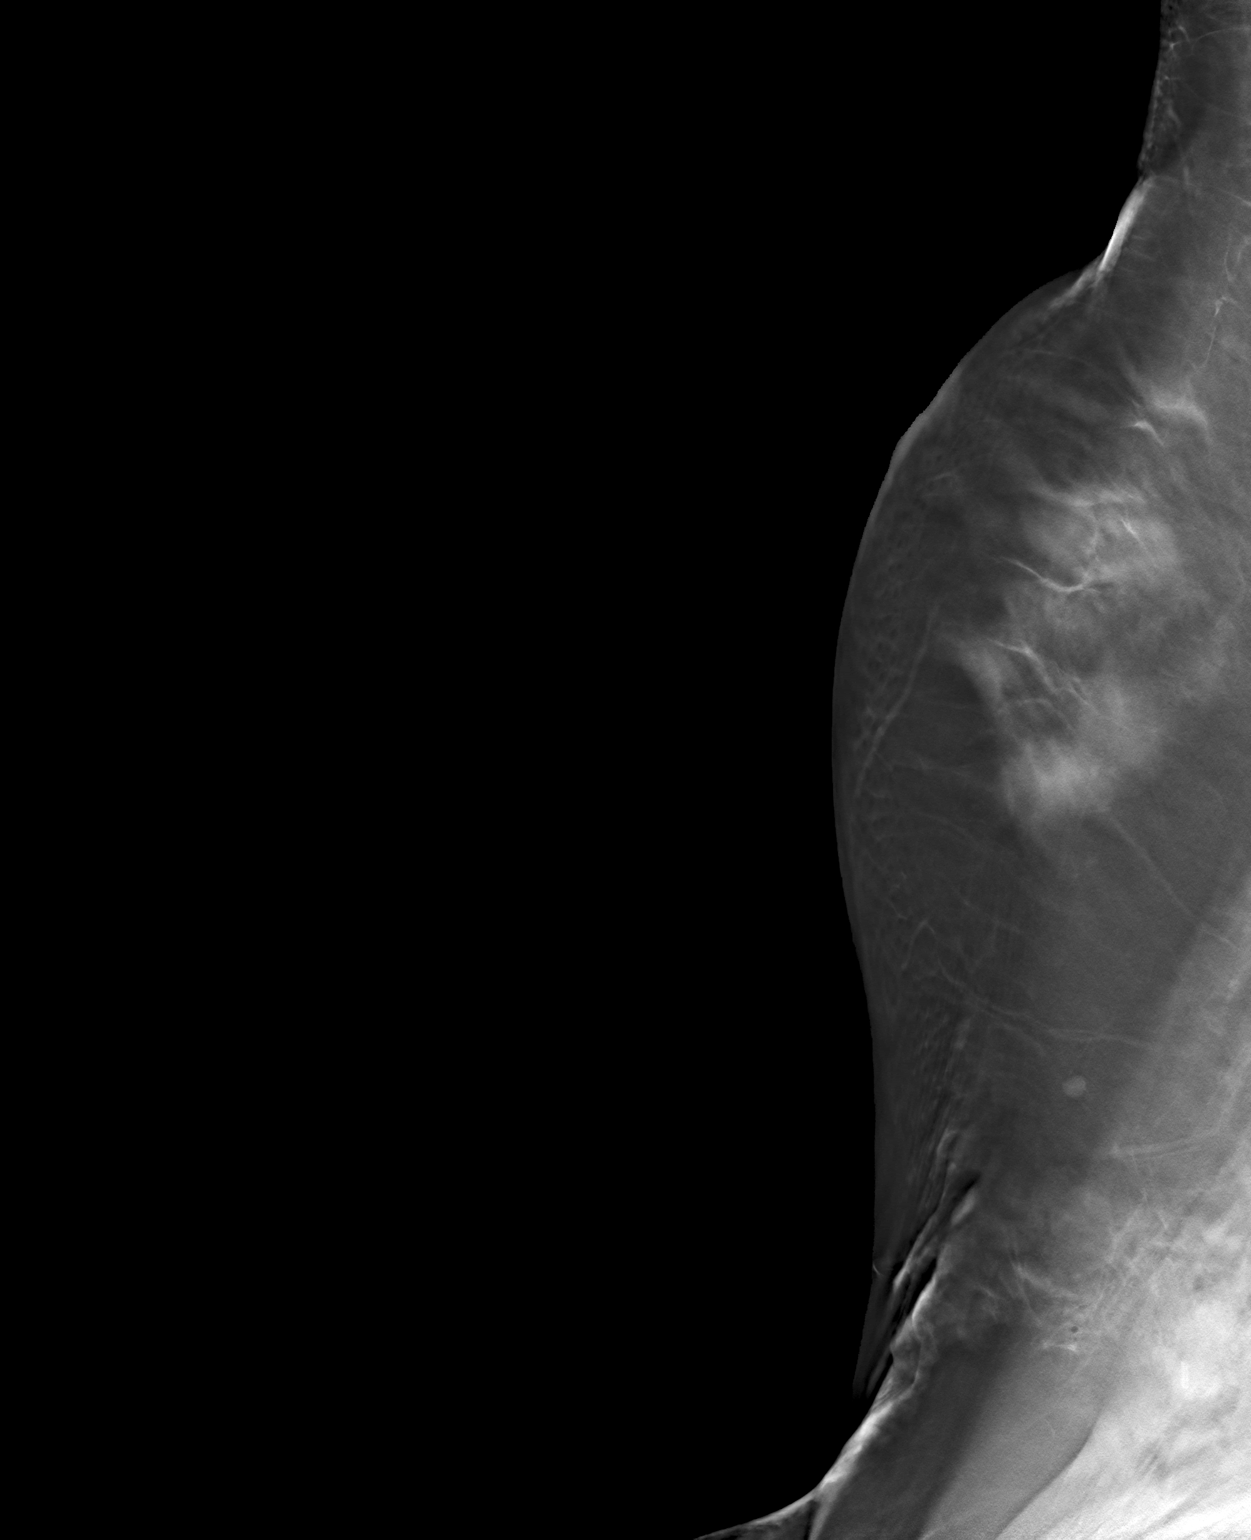
[frame 50/99]
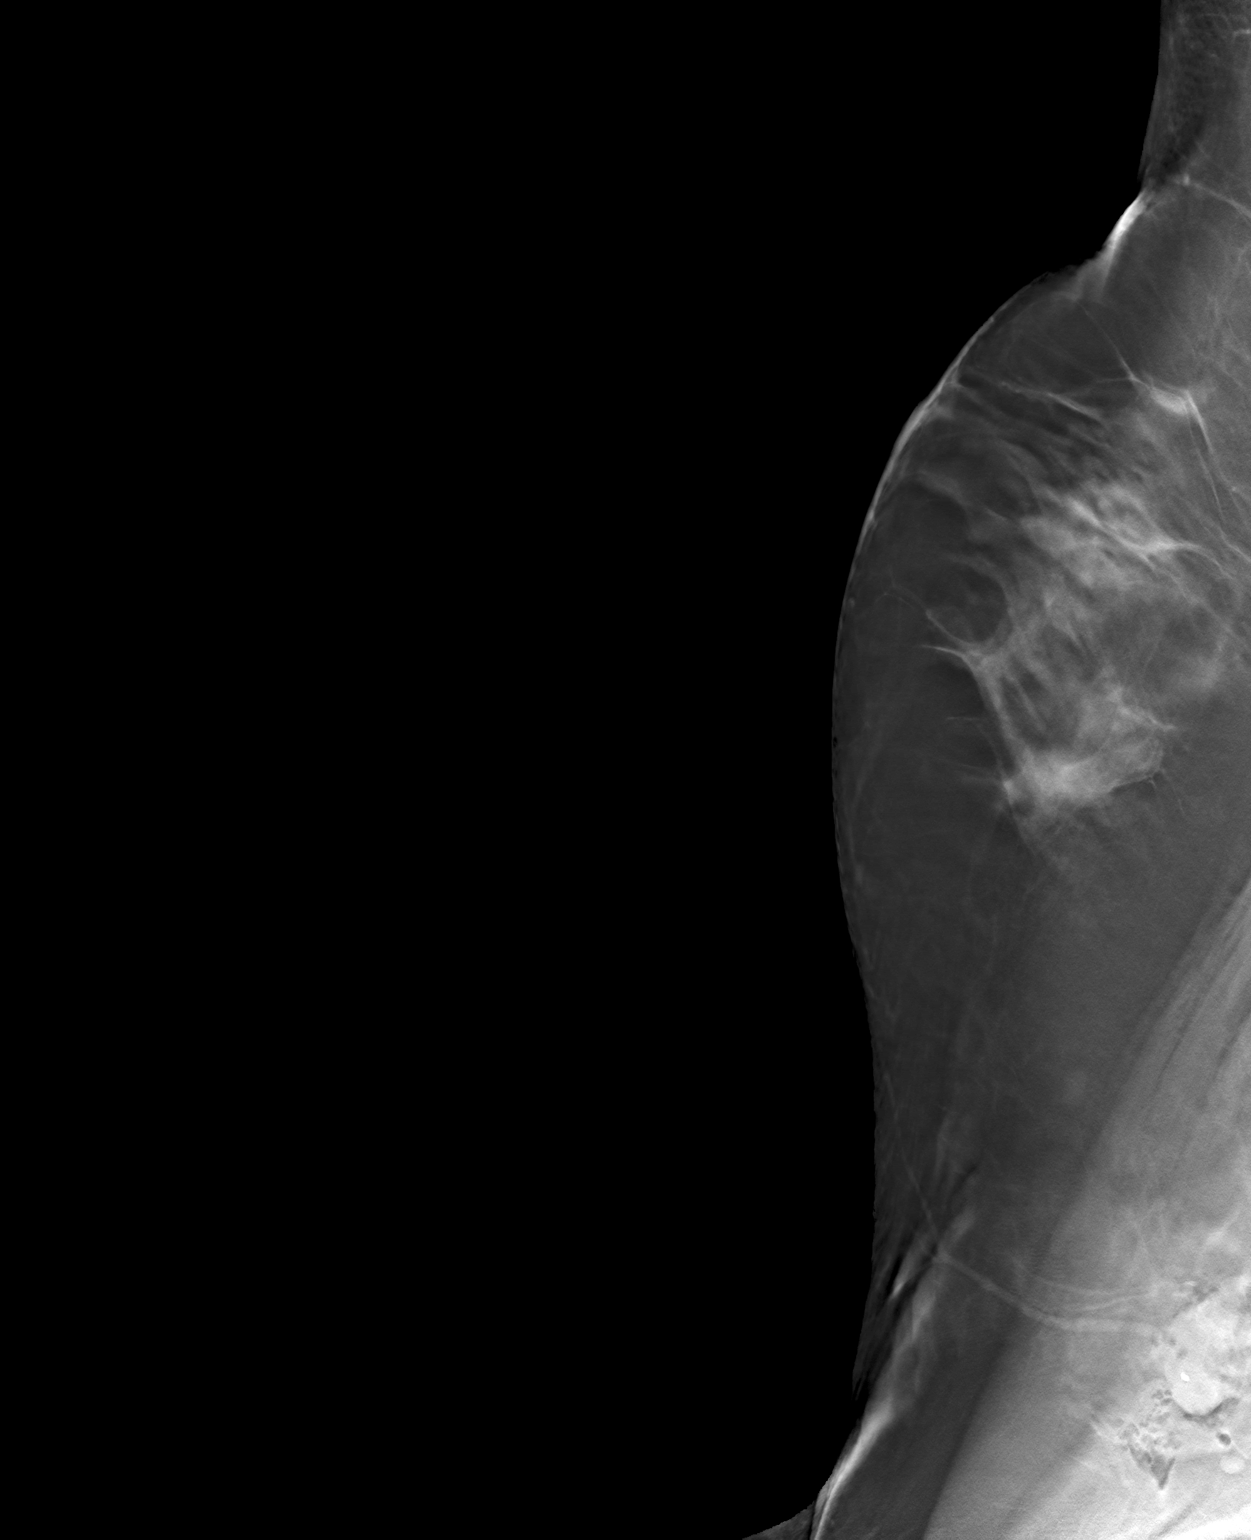

[3 of 6 positions shown; findings below may reference images not displayed]

FINDINGS: Mammographic images were obtained following ultrasound guided biopsy
of enlarged LEFT axillary lymph node. A spiral shaped clip is
identified within the LEFT axilla, within the mammographically
detected lymph node confirmed on tomosynthesis images.
IMPRESSION: Tissue marker clip in the expected location following biopsy.

Final Assessment: Post Procedure Mammograms for Marker Placement

## 2019-06-12 LAB — SURGICAL PATHOLOGY

## 2019-06-16 DIAGNOSIS — M9903 Segmental and somatic dysfunction of lumbar region: Secondary | ICD-10-CM | POA: Diagnosis not present

## 2019-06-16 DIAGNOSIS — M5136 Other intervertebral disc degeneration, lumbar region: Secondary | ICD-10-CM | POA: Diagnosis not present

## 2019-06-16 DIAGNOSIS — M9905 Segmental and somatic dysfunction of pelvic region: Secondary | ICD-10-CM | POA: Diagnosis not present

## 2019-06-16 DIAGNOSIS — M5432 Sciatica, left side: Secondary | ICD-10-CM | POA: Diagnosis not present

## 2019-06-20 DIAGNOSIS — Z1159 Encounter for screening for other viral diseases: Secondary | ICD-10-CM | POA: Diagnosis not present

## 2019-06-22 DIAGNOSIS — R0600 Dyspnea, unspecified: Secondary | ICD-10-CM | POA: Diagnosis not present

## 2019-06-22 DIAGNOSIS — L98499 Non-pressure chronic ulcer of skin of other sites with unspecified severity: Secondary | ICD-10-CM | POA: Diagnosis not present

## 2019-06-22 DIAGNOSIS — Z23 Encounter for immunization: Secondary | ICD-10-CM | POA: Diagnosis not present

## 2019-07-11 DIAGNOSIS — R591 Generalized enlarged lymph nodes: Secondary | ICD-10-CM | POA: Diagnosis not present

## 2019-07-14 DIAGNOSIS — M5136 Other intervertebral disc degeneration, lumbar region: Secondary | ICD-10-CM | POA: Diagnosis not present

## 2019-07-14 DIAGNOSIS — M9905 Segmental and somatic dysfunction of pelvic region: Secondary | ICD-10-CM | POA: Diagnosis not present

## 2019-07-14 DIAGNOSIS — M9903 Segmental and somatic dysfunction of lumbar region: Secondary | ICD-10-CM | POA: Diagnosis not present

## 2019-07-14 DIAGNOSIS — M5432 Sciatica, left side: Secondary | ICD-10-CM | POA: Diagnosis not present

## 2019-07-17 DIAGNOSIS — L309 Dermatitis, unspecified: Secondary | ICD-10-CM | POA: Diagnosis not present

## 2019-07-17 DIAGNOSIS — R21 Rash and other nonspecific skin eruption: Secondary | ICD-10-CM | POA: Diagnosis not present

## 2019-07-17 DIAGNOSIS — L608 Other nail disorders: Secondary | ICD-10-CM | POA: Diagnosis not present

## 2019-07-27 DIAGNOSIS — G4733 Obstructive sleep apnea (adult) (pediatric): Secondary | ICD-10-CM | POA: Diagnosis not present

## 2019-08-02 ENCOUNTER — Other Ambulatory Visit: Payer: Self-pay | Admitting: Family Medicine

## 2019-08-02 DIAGNOSIS — R599 Enlarged lymph nodes, unspecified: Secondary | ICD-10-CM

## 2019-08-02 DIAGNOSIS — R591 Generalized enlarged lymph nodes: Secondary | ICD-10-CM

## 2019-08-07 ENCOUNTER — Other Ambulatory Visit: Payer: Self-pay

## 2019-08-07 ENCOUNTER — Ambulatory Visit
Admission: RE | Admit: 2019-08-07 | Discharge: 2019-08-07 | Disposition: A | Discharge status: Home / Self Care | Payer: BLUE CROSS/BLUE SHIELD | Source: Ambulatory Visit | Attending: Family Medicine | Admitting: Family Medicine

## 2019-08-07 DIAGNOSIS — R591 Generalized enlarged lymph nodes: Secondary | ICD-10-CM | POA: Insufficient documentation

## 2019-08-07 DIAGNOSIS — M7989 Other specified soft tissue disorders: Secondary | ICD-10-CM | POA: Diagnosis not present

## 2019-08-07 DIAGNOSIS — R599 Enlarged lymph nodes, unspecified: Secondary | ICD-10-CM

## 2019-08-07 IMAGING — US US EXTREM UP*L* LTD
2 series · 14 of 19 positions shown · non-contrast
Comparison: None.

CLINICAL DATA: Left axillary swelling. The patient underwent biopsy
of a lymph node in [DATE] since [DATE]. The node
has increased in size.

EXAM:
ULTRASOUND LEFT UPPER EXTREMITY LIMITED
TECHNIQUE: Ultrasound examination of the upper extremity soft tissues was
performed in the area of clinical concern.

[Series 1: us extrem up*left* ltd · 0.07mm/px · 18 acquisitions, 13 frames shown (1 of 2)]
[im 1/18]
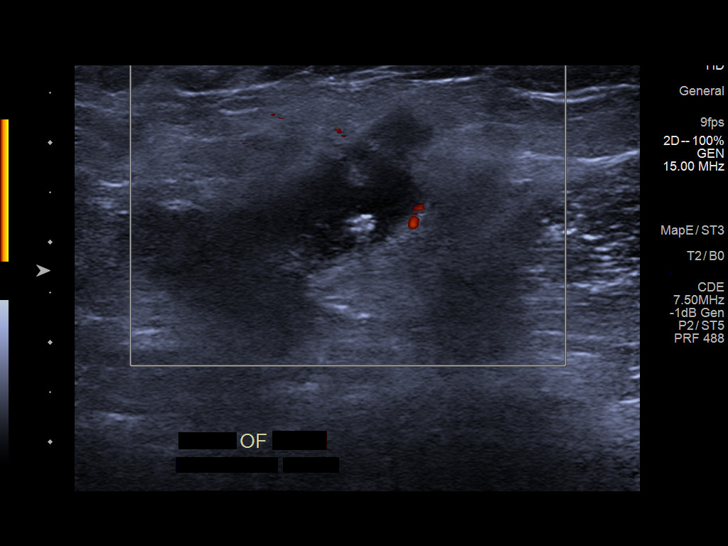
[im 3/18]
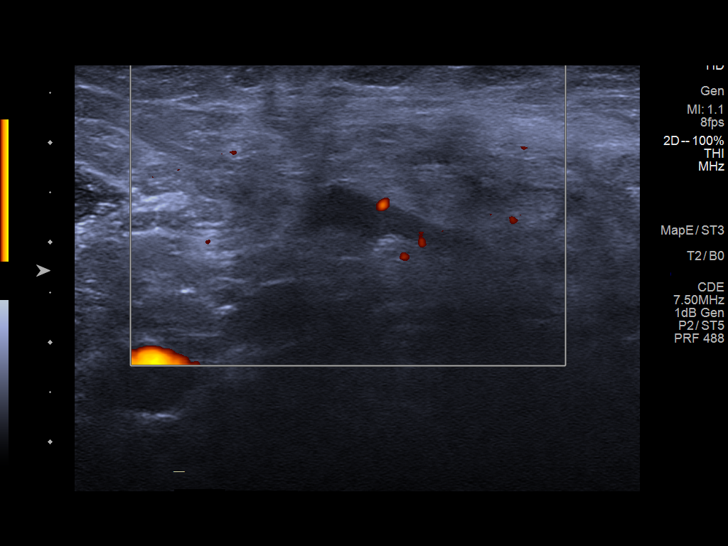
[im 4/18]
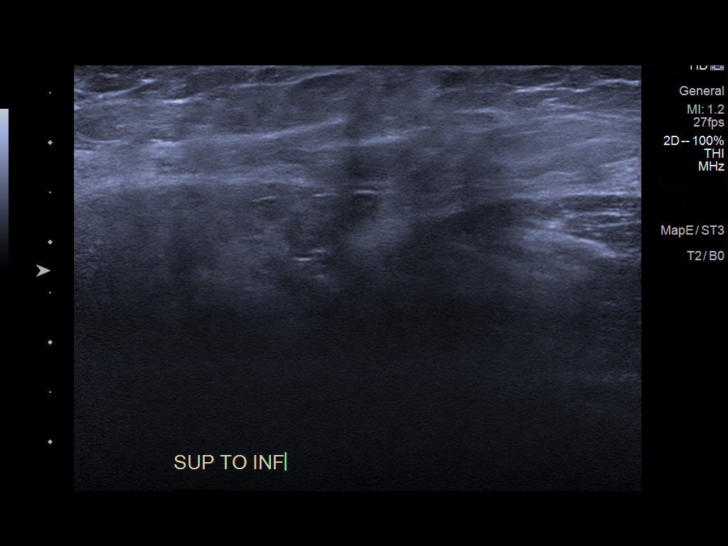
[im 5/18]
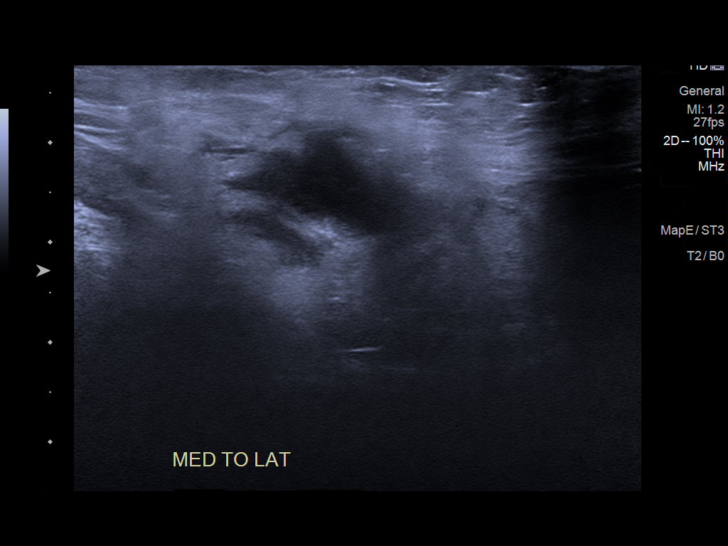
[im 7/18]
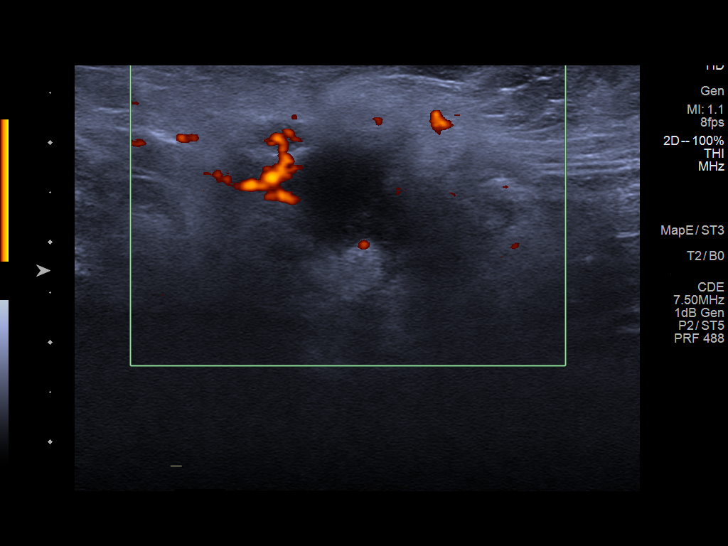
[im 8/18]
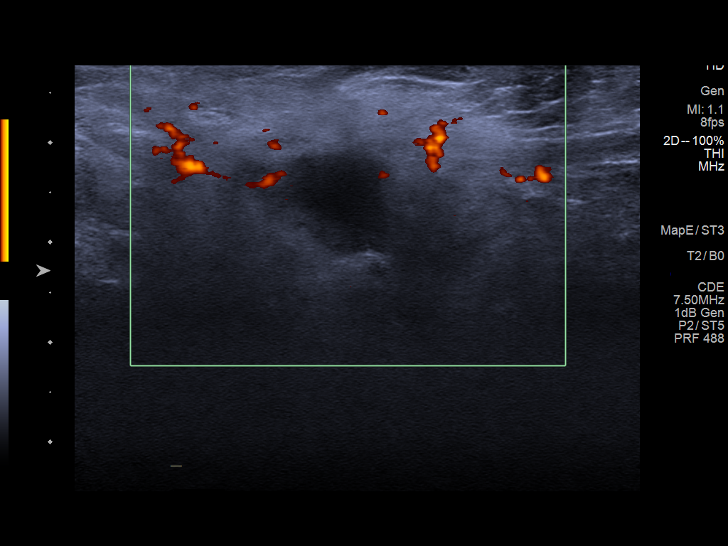
[im 9/18]
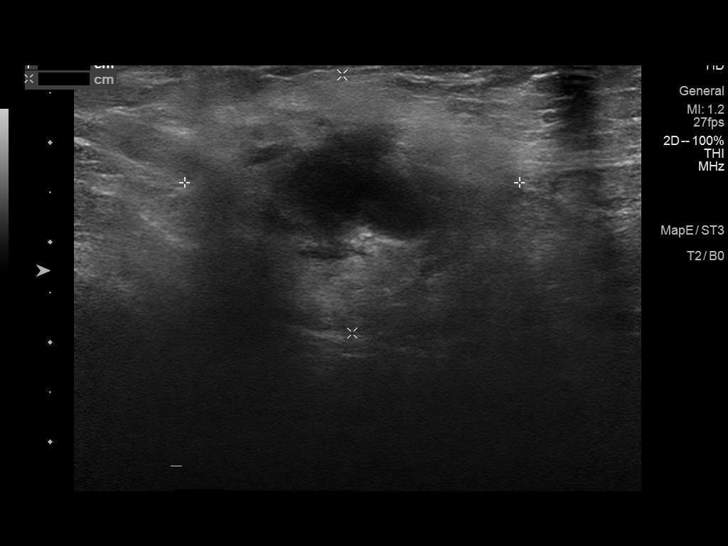
[im 11/18]
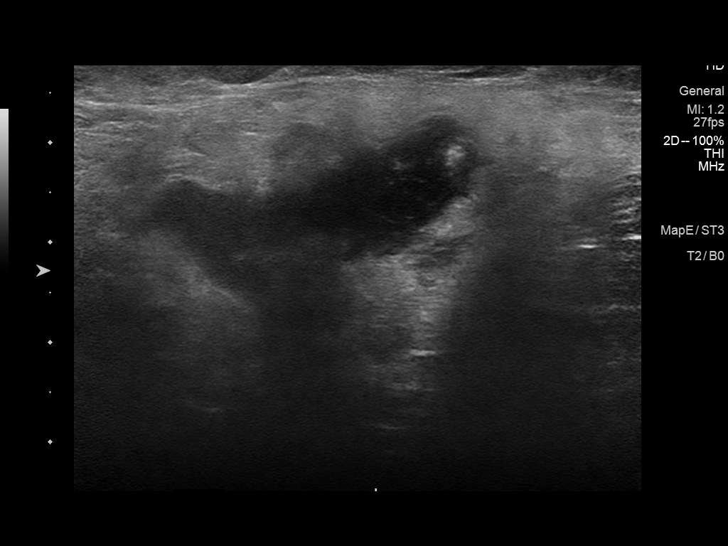
[im 12/18]
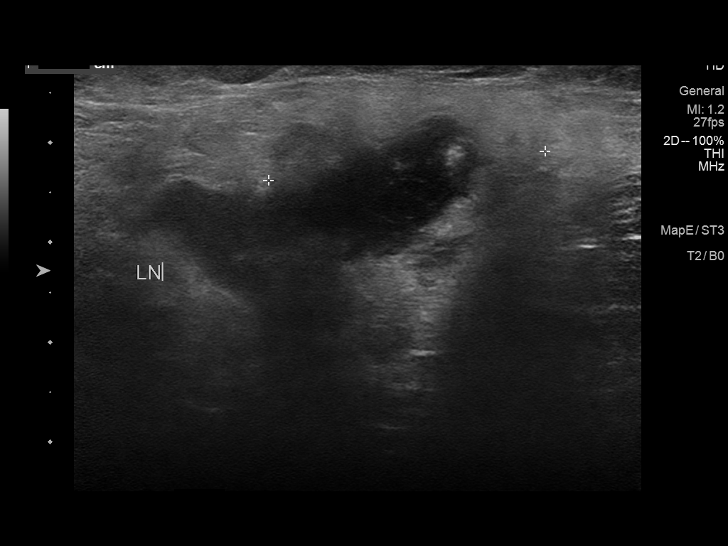
[im 13/18]
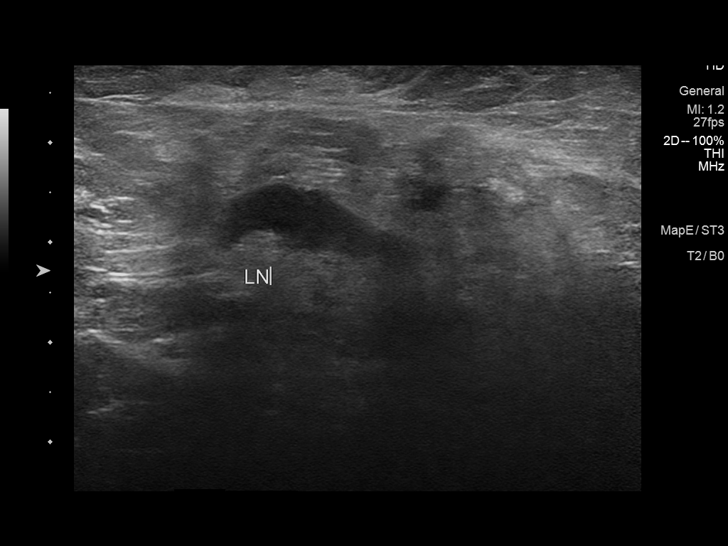
[im 15/18]
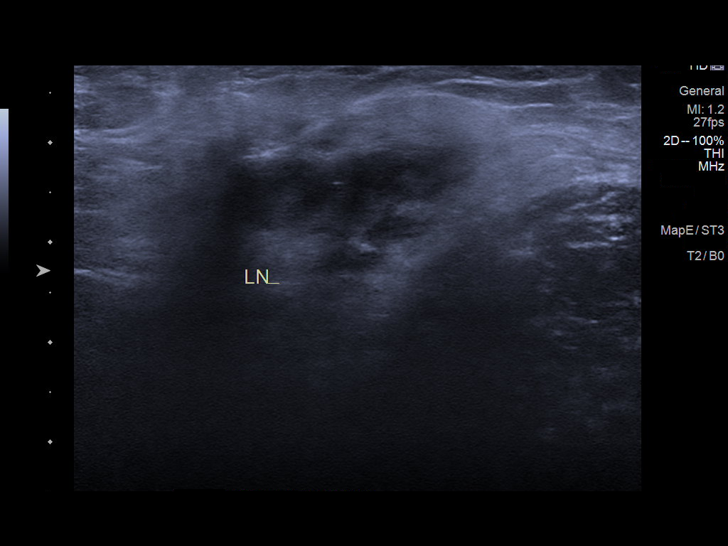
[im 16/18]
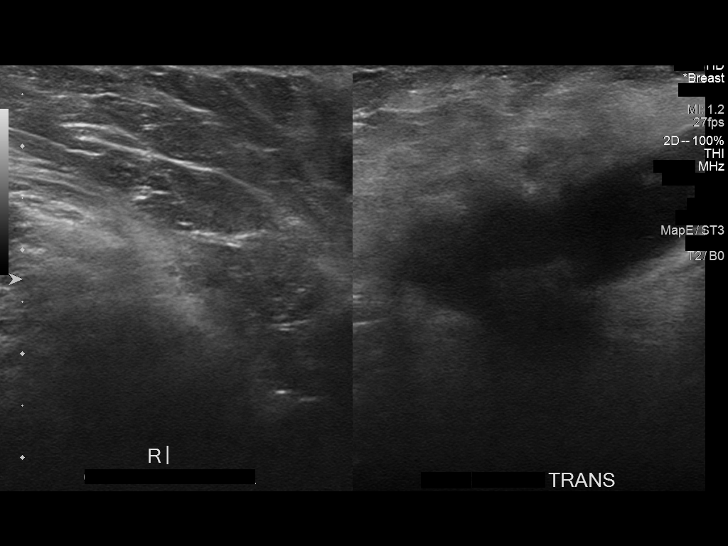
[im 17/18]
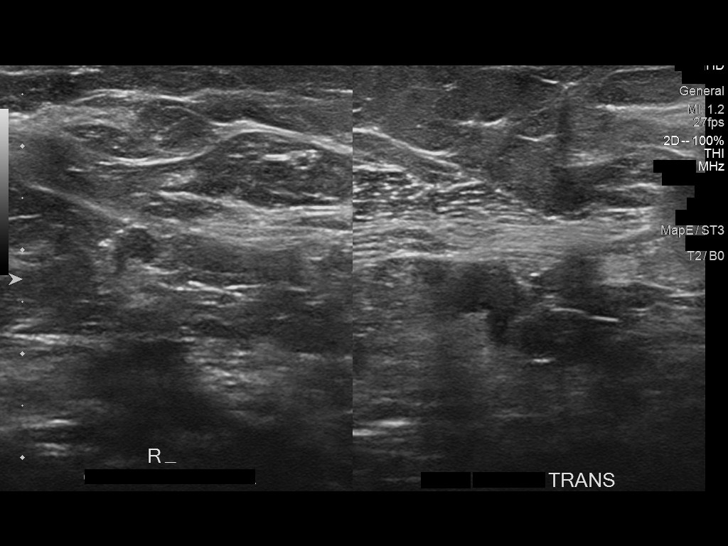

[Series 2: us extrem up*left* ltd · 0.07mm/px · 1 of 1 slices shown (2 of 2)]
[im 1/1]
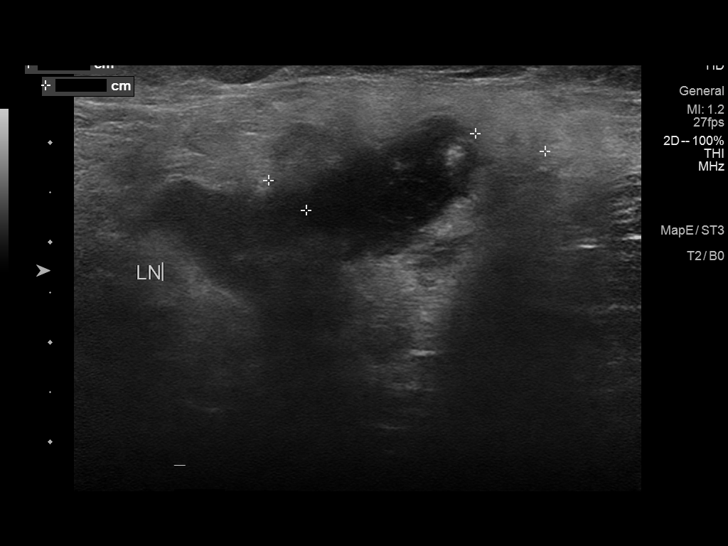

[14 of 19 positions shown; findings below may reference images not displayed]

FINDINGS: A lesion measuring approximately 3.4 x 2.6 x 2.8 cm is peripherally
hyperechoic with a central area of decreased echogenicity measuring
1.6 x 1.1 x 1.9 cm. There is some Doppler signal within the lymph
node peripheral to the central area of decreased echogenicity. A
small focus of markedly increased echogenicity within the lesion may
be a HydroMARK.
IMPRESSION: Enlarged left axillary lymph node with a central area of fluid could
be due to suppurative adenitis or necrosis. Aspiration could be used
for further evaluation. If aspiration is not undertaken, consider CT
of the chest with IV contrast for further evaluation.

## 2019-08-08 DIAGNOSIS — R590 Localized enlarged lymph nodes: Secondary | ICD-10-CM | POA: Diagnosis not present

## 2019-08-08 DIAGNOSIS — Z872 Personal history of diseases of the skin and subcutaneous tissue: Secondary | ICD-10-CM | POA: Diagnosis not present

## 2019-08-08 DIAGNOSIS — L603 Nail dystrophy: Secondary | ICD-10-CM | POA: Diagnosis not present

## 2019-08-08 DIAGNOSIS — L309 Dermatitis, unspecified: Secondary | ICD-10-CM | POA: Diagnosis not present

## 2019-08-31 DIAGNOSIS — G4733 Obstructive sleep apnea (adult) (pediatric): Secondary | ICD-10-CM | POA: Diagnosis not present

## 2019-09-27 ENCOUNTER — Other Ambulatory Visit: Payer: Self-pay | Admitting: Pulmonary Disease

## 2019-09-27 DIAGNOSIS — R591 Generalized enlarged lymph nodes: Secondary | ICD-10-CM | POA: Diagnosis not present

## 2019-09-27 DIAGNOSIS — R911 Solitary pulmonary nodule: Secondary | ICD-10-CM | POA: Diagnosis not present

## 2019-10-12 ENCOUNTER — Ambulatory Visit: Admission: RE | Admit: 2019-10-12 | Payer: BC Managed Care – PPO | Source: Ambulatory Visit

## 2019-10-13 ENCOUNTER — Other Ambulatory Visit: Payer: Self-pay | Admitting: Pulmonary Disease

## 2019-10-13 DIAGNOSIS — R591 Generalized enlarged lymph nodes: Secondary | ICD-10-CM

## 2019-10-13 DIAGNOSIS — R911 Solitary pulmonary nodule: Secondary | ICD-10-CM

## 2019-10-20 ENCOUNTER — Ambulatory Visit: Payer: BLUE CROSS/BLUE SHIELD

## 2019-10-31 DIAGNOSIS — M79675 Pain in left toe(s): Secondary | ICD-10-CM | POA: Diagnosis not present

## 2019-10-31 DIAGNOSIS — M79674 Pain in right toe(s): Secondary | ICD-10-CM | POA: Diagnosis not present

## 2019-10-31 DIAGNOSIS — L600 Ingrowing nail: Secondary | ICD-10-CM | POA: Diagnosis not present

## 2019-10-31 DIAGNOSIS — M216X2 Other acquired deformities of left foot: Secondary | ICD-10-CM | POA: Diagnosis not present

## 2019-11-07 DIAGNOSIS — R911 Solitary pulmonary nodule: Secondary | ICD-10-CM | POA: Diagnosis not present

## 2019-11-07 DIAGNOSIS — R0600 Dyspnea, unspecified: Secondary | ICD-10-CM | POA: Diagnosis not present

## 2019-11-08 DIAGNOSIS — M79675 Pain in left toe(s): Secondary | ICD-10-CM | POA: Diagnosis not present

## 2019-11-08 DIAGNOSIS — L600 Ingrowing nail: Secondary | ICD-10-CM | POA: Diagnosis not present

## 2019-11-08 DIAGNOSIS — M79674 Pain in right toe(s): Secondary | ICD-10-CM | POA: Diagnosis not present

## 2019-11-08 DIAGNOSIS — M216X2 Other acquired deformities of left foot: Secondary | ICD-10-CM | POA: Diagnosis not present

## 2019-11-17 ENCOUNTER — Other Ambulatory Visit: Payer: Self-pay | Admitting: Family Medicine

## 2019-11-17 ENCOUNTER — Other Ambulatory Visit: Payer: Self-pay

## 2019-11-17 ENCOUNTER — Ambulatory Visit: Payer: BC Managed Care – PPO | Admitting: Family Medicine

## 2019-11-17 ENCOUNTER — Encounter: Payer: Self-pay | Admitting: Family Medicine

## 2019-11-17 ENCOUNTER — Ambulatory Visit
Admission: RE | Admit: 2019-11-17 | Discharge: 2019-11-17 | Disposition: A | Discharge status: Home / Self Care | Payer: BC Managed Care – PPO | Source: Ambulatory Visit | Attending: Pulmonary Disease | Admitting: Pulmonary Disease

## 2019-11-17 VITALS — BP 144/86 | HR 94 | Temp 98.4°F | Resp 16 | Ht 62.0 in | Wt 169.8 lb

## 2019-11-17 DIAGNOSIS — E669 Obesity, unspecified: Secondary | ICD-10-CM | POA: Diagnosis not present

## 2019-11-17 DIAGNOSIS — J432 Centrilobular emphysema: Secondary | ICD-10-CM

## 2019-11-17 DIAGNOSIS — G720 Drug-induced myopathy: Secondary | ICD-10-CM | POA: Insufficient documentation

## 2019-11-17 DIAGNOSIS — Z7689 Persons encountering health services in other specified circumstances: Secondary | ICD-10-CM

## 2019-11-17 DIAGNOSIS — Z87891 Personal history of nicotine dependence: Secondary | ICD-10-CM | POA: Diagnosis not present

## 2019-11-17 DIAGNOSIS — M791 Myalgia, unspecified site: Secondary | ICD-10-CM

## 2019-11-17 DIAGNOSIS — R911 Solitary pulmonary nodule: Secondary | ICD-10-CM | POA: Insufficient documentation

## 2019-11-17 DIAGNOSIS — G4733 Obstructive sleep apnea (adult) (pediatric): Secondary | ICD-10-CM

## 2019-11-17 DIAGNOSIS — Z9989 Dependence on other enabling machines and devices: Secondary | ICD-10-CM

## 2019-11-17 DIAGNOSIS — R7309 Other abnormal glucose: Secondary | ICD-10-CM

## 2019-11-17 DIAGNOSIS — R591 Generalized enlarged lymph nodes: Secondary | ICD-10-CM | POA: Diagnosis not present

## 2019-11-17 DIAGNOSIS — T466X5A Adverse effect of antihyperlipidemic and antiarteriosclerotic drugs, initial encounter: Secondary | ICD-10-CM

## 2019-11-17 DIAGNOSIS — R918 Other nonspecific abnormal finding of lung field: Secondary | ICD-10-CM | POA: Diagnosis not present

## 2019-11-17 DIAGNOSIS — Z0000 Encounter for general adult medical examination without abnormal findings: Secondary | ICD-10-CM

## 2019-11-17 DIAGNOSIS — E782 Mixed hyperlipidemia: Secondary | ICD-10-CM

## 2019-11-17 DIAGNOSIS — K5903 Drug induced constipation: Secondary | ICD-10-CM | POA: Diagnosis not present

## 2019-11-17 DIAGNOSIS — E039 Hypothyroidism, unspecified: Secondary | ICD-10-CM

## 2019-11-17 DIAGNOSIS — Z1231 Encounter for screening mammogram for malignant neoplasm of breast: Secondary | ICD-10-CM

## 2019-11-17 DIAGNOSIS — Z8481 Family history of carrier of genetic disease: Secondary | ICD-10-CM

## 2019-11-17 DIAGNOSIS — G2581 Restless legs syndrome: Secondary | ICD-10-CM | POA: Insufficient documentation

## 2019-11-17 IMAGING — CT CT CHEST W/O CM
1 of 2 series · 14 of 30 positions shown, 18 images · non-contrast
Comparison: Chest CT [DATE].

CLINICAL DATA: History of pulmonary nodule seen on a chest CT scan
[DATE].

EXAM:
CT CHEST WITHOUT CONTRAST
TECHNIQUE: Multidetector CT imaging of the chest was performed following the
standard protocol without IV contrast.

[Series 2: thorax · axial · 0.62mm/px · z∈[-624,-388]mm · 14 of 138 slices shown, 18 images]
[im 10/138  mediastinal]
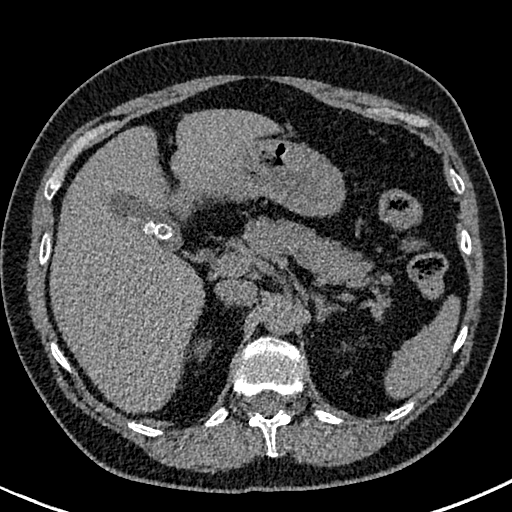
[im 10/138  lung]
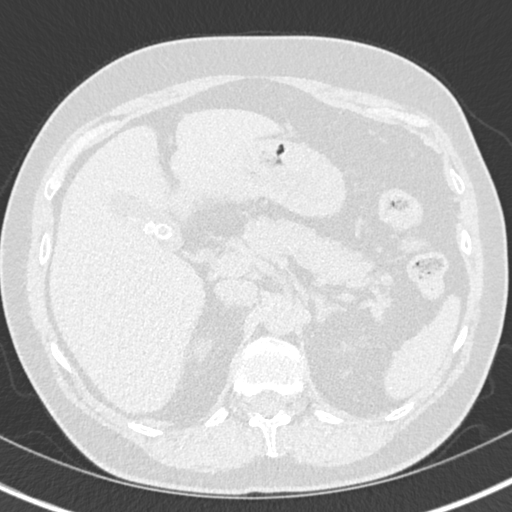
[im 20/138  lung]
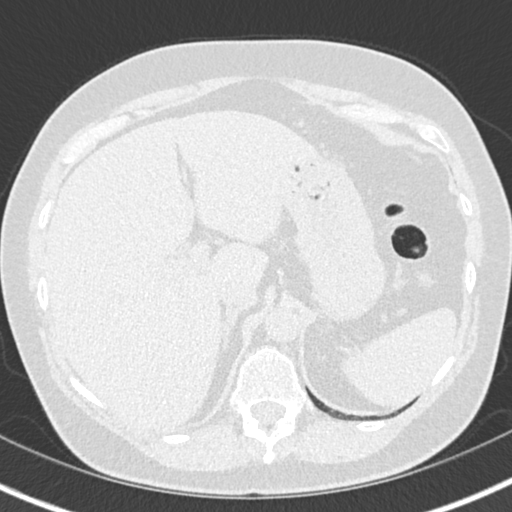
[im 30/138  lung]
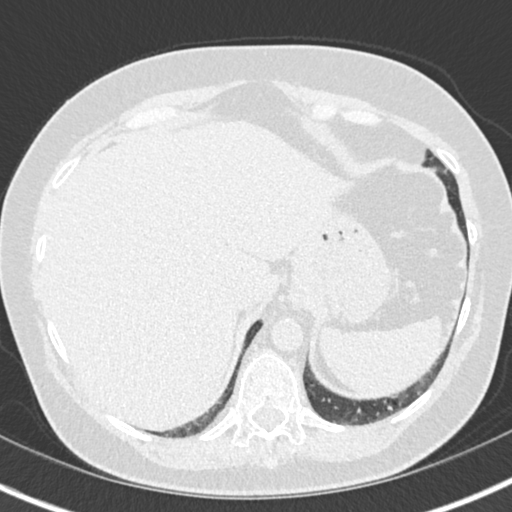
[im 40/138  lung]
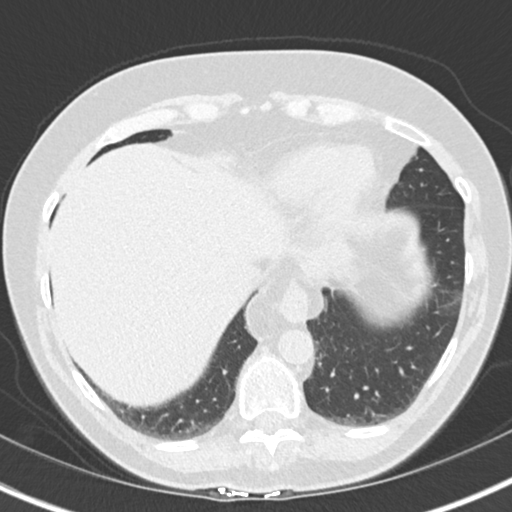
[im 49/138  mediastinal]
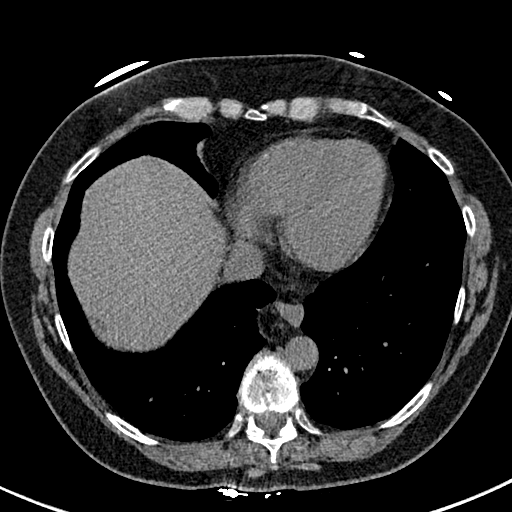
[im 49/138  lung]
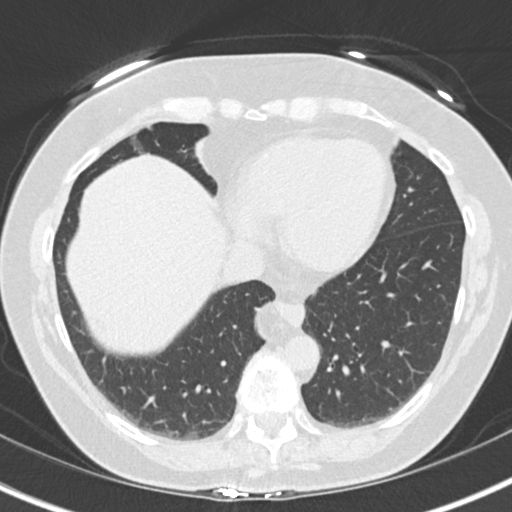
[im 59/138  lung]
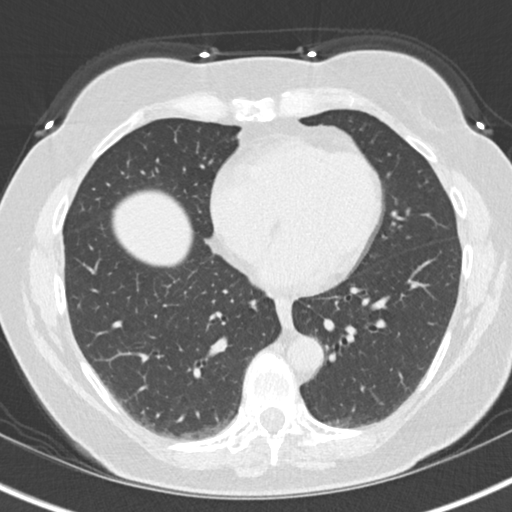
[im 65/138  lung]
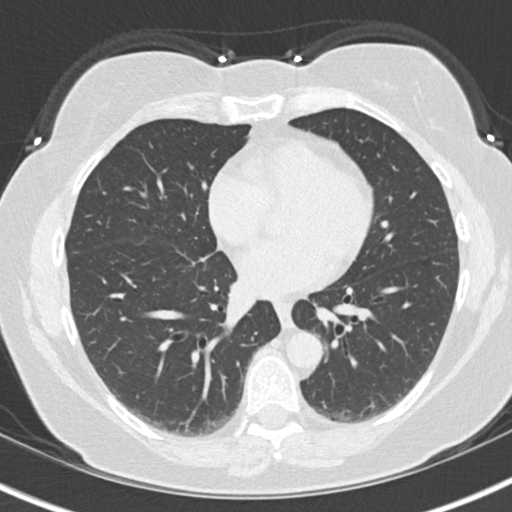
[im 69/138  lung]
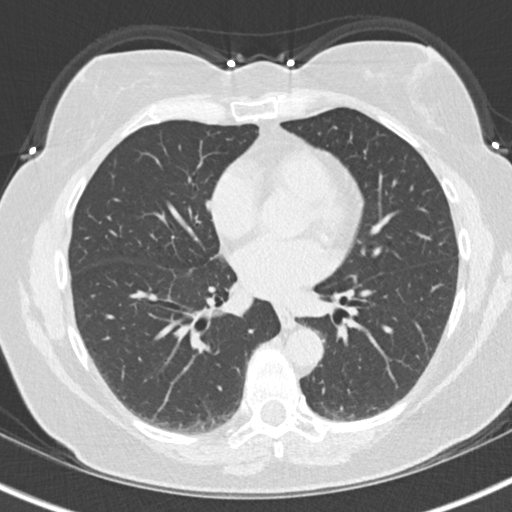
[im 79/138  mediastinal]
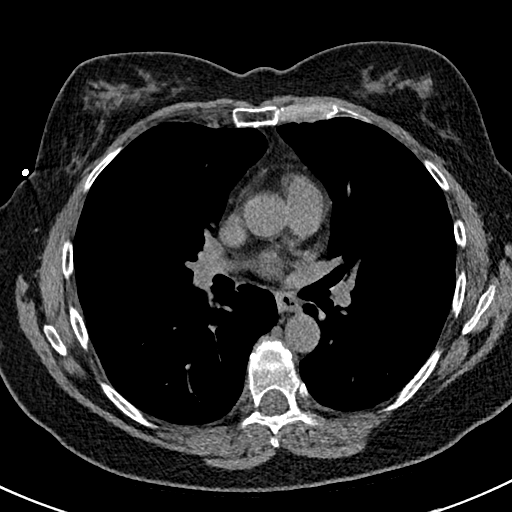
[im 79/138  lung]
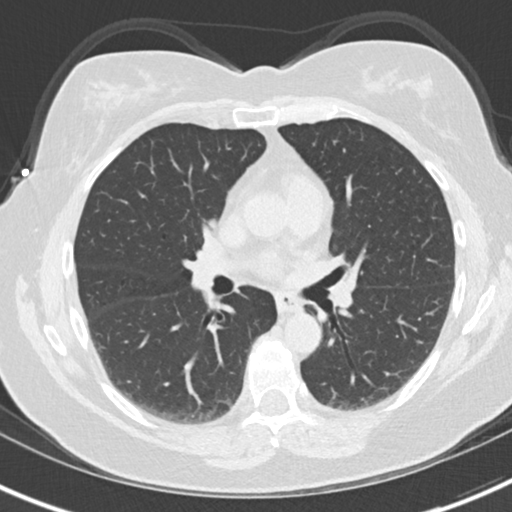
[im 89/138  lung]
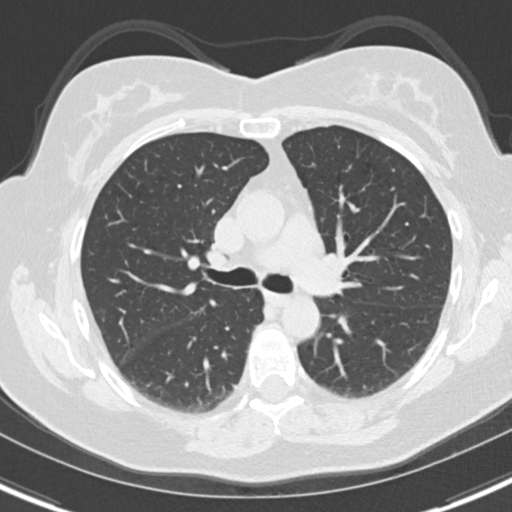
[im 98/138  lung]
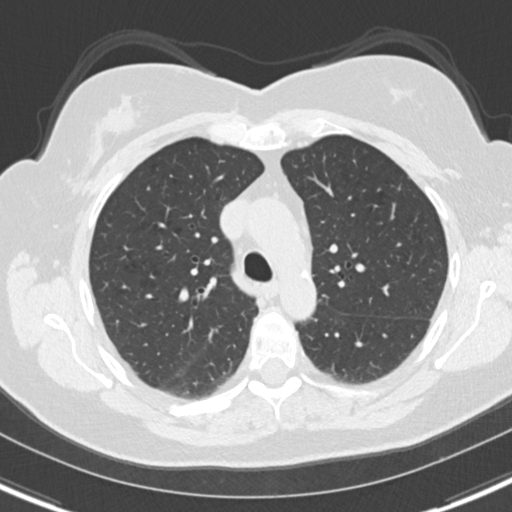
[im 108/138  lung]
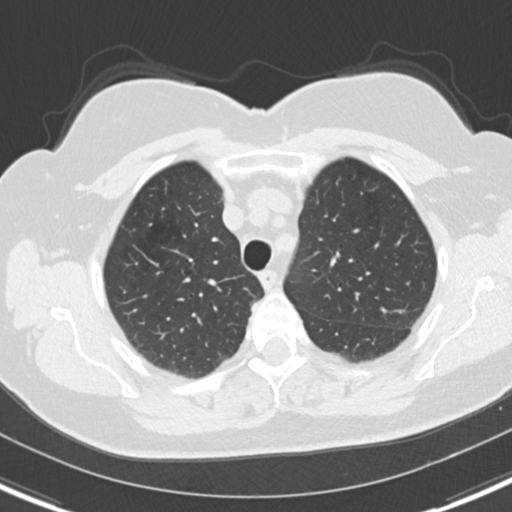
[im 118/138  mediastinal]
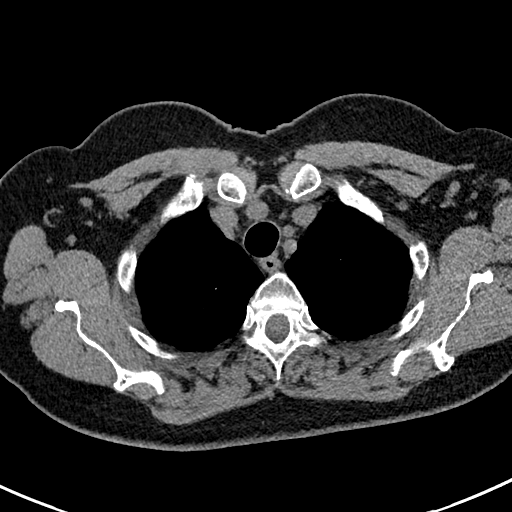
[im 118/138  lung]
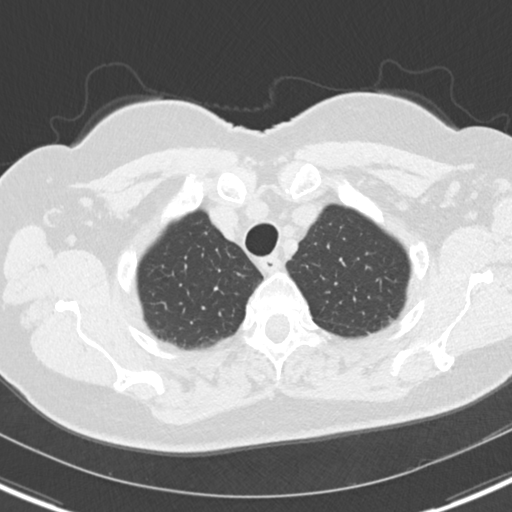
[im 128/138  lung]
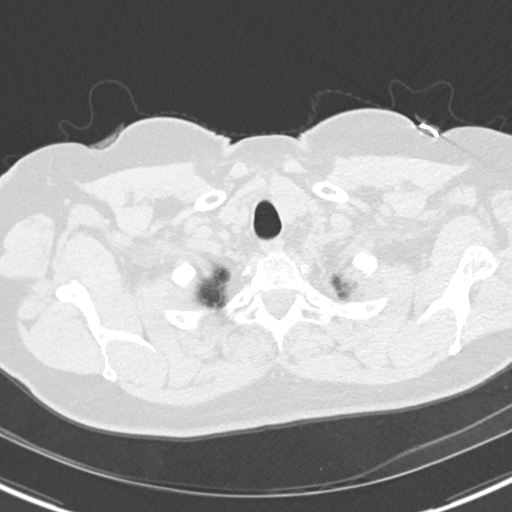

[14 of 30 positions shown; findings below may reference images not displayed]

FINDINGS: Cardiovascular: No significant vascular findings. Normal heart size.
No pericardial effusion. A few atherosclerotic calcifications of the
aorta are noted.

Mediastinum/Nodes: No enlarged mediastinal or axillary lymph nodes.
Thyroid gland, trachea, and esophagus demonstrate no significant
findings. Small hiatal hernia is seen.

Lungs/Pleura: Again seen is a 0.5 cm nodule along the undersurface
of the minor fissure which has a flat configuration on sagittal
imaging and is consistent with a benign lymph node. There is some
emphysematous disease present. Mild dependent atelectasis noted. No
pleural effusion.

Upper Abdomen: Stones in the gallbladder are unchanged. No evidence
of cholecystitis. Small left adrenal adenoma is unchanged. Upper
abdomen otherwise negative.

Musculoskeletal: No acute or focal abnormality.
IMPRESSION: No change in a 0.5 cm nodule along the undersurface of the minor
fissure consistent with a benign subpleural lymph node. No follow-up
imaging is recommended.

Gallstones without evidence of cholecystitis.

Aortic Atherosclerosis ([ZQ]-[ZQ]) and Emphysema ([ZQ]-[ZQ]).

## 2019-11-17 NOTE — Patient Instructions (Addendum)
Thank you for coming to the office today.  Keep track of BP, write down readings occasionally few times a week.  Follow up with Dr Lanney Gins with CT results.  Let me know if interested in ENT consultation for deviated septum.  Will check cholesterol all labs / thyroid etc. Before next visit.  Next pap smear in 2022 will be 3 years.  Write down or bring list of any or all cholesterol medicines primarily statins you have tried.  Consider Repatha  [email or web address]  Consider Praluent [email or web address]  These are medications that are injectable options every 2 to 4 weeks for cholesterol, monoclonal antibody that is very safe to help block the pathway to making extra cholesterol, can help reduce risk of heart attack or stroke.  DUE for FASTING BLOOD WORK (no food or drink after midnight before the lab appointment, only water or coffee without cream/sugar on the morning of)  SCHEDULE "Lab Only" visit in the morning at the clinic for lab draw in 4.5 MONTHS   - Make sure Lab Only appointment is at about 1 week before your next appointment, so that results will be available  For Lab Results, once available within 2-3 days of blood draw, you can can log in to MyChart online to view your results and a brief explanation. Also, we can discuss results at next follow-up visit.    Please schedule a Follow-up Appointment to: Return in about 5 months (around 04/18/2020) for Annual Physical.  If you have any other questions or concerns, please feel free to call the office or send a message through [employer]. You may also schedule an earlier appointment if necessary.  Additionally, you may be receiving a survey about your experience at our office within a few days to 1 week by e-mail or mail. We value your feedback.  Nobie Putnam, DO [place]

## 2019-11-17 NOTE — Progress Notes (Signed)
Subjective:    Patient ID: Sherry Oliver, female    DOB: 1960/10/31, 59 y.o.   MRN: [id number]  Sherry Oliver is a 59 y.o. female presenting on 11/17/2019 for Establish Care (thyroid disorder)  Previous PCP [facility] in [place]. Currently living in [place] and now moving this area.  HPI   Transition from prior PCP  CHRONIC HTN: Reports occasional home BP checks. Has intermittent elevation. Current Meds - none   Lifestyle: - Diet: admits some sodium / caffeine in diet, not always hydrating - Exercise: not as active due to L foot. Denies CP, dyspnea, HA, edema, dizziness / lightheadedness  HYPERLIPIDEMIA: - Reports history issues of high cholesterol and failed statins due to myalgia. - Nyacin, Fish Oil - Rosuvastatin (crestor), Pitavstatin, Atorvastatin (lipitor), Simvastatin (zocor)  Hypothyroidism Chronic problem for >30 years. History of temperature instability and lab test showed problem. Last lab TSH normal 0.49 (03/2019)  GERD Taking Nexium 15m OTC BID  History L deviated septum / epistaxis Identified in 2019. Anticipated future ENT surgical repair She is off Advil because it could cause nose bleed  Former Smoker Quit in 2019 on chantix, smoked for 45 years. She did not have significant improvement after quitting smoking for 6 months. She had dyspnea and eventually saw Cardiologist had good  - Then followed up with Pulmonology through [facility] She saw Dr FOttie Glazier Had PFTs that overall looked good. There was concern of possible underlying asthma component. Now she is on Trelegy inhaler to see if improved. Also using incentive spirometer  OSA on CPAP Chronic problem. Has been doing well with CPAP using nightly. She does have complications with RLS and periodic leg movement overnight and some "twitching" with skin feeling like it was crawling. CPAP was adjusted and that has helped her sleep at night. She has tried Reqiup with intermittent results.  She had L leg muscle twitching. Now doing better on Mirapex 1.5 quarter tab nightly.  Eczema / Nail Pitting Now [facility] TBrendolyn Patty on topical steroid and Prior background, there was concern for nail pitting could be rheumatological. Was referred to Dr BAnnalee Genta but no rheum diagnosis.   Migraine w/ Aura Visual issue with prism in vision and spreads out, sometimes may have headache following, x 1 month, not taking med.   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   Health Maintenance:  Breast Cancer Screening: Left sided lymph node swollen under L arm axilla, then repeat mammogram had another lymph node. Her mother has BRCA 2 gene and had double mastectomy around age 61 She proceeded with lymph node biopsy, ultimately it was benign thought to be reactive.  She had swelling persistent after marker / biopsy. Thought allergic reaction. Had ultrasound eventually, now pending CT scan, was not scheduled. Since Feb 2021 the lymphadenopathy was improving since that time. Now finally had CT Chest for routine pulmonology follow-up on pulm nodule.  Dec 2008 had ruptured appendix. She was hospitalized for a week. Still had fever when left, she required a abdominal drain, caused severe allergic reaction localized with swelling, ultimately resolved after removal of the drain, thought to be allergic to latex.  Identified multiple small issues identified incidentally on imaging from that visit. - Adrenal nodule has not changed  Cervical Cancer Screening - Mother has BRCA 2 - hysterectomy found uterine cancer. Patient has had pap smears, last one 01/04/18 [facility], negative intraepithelial malignancy and negative High Risk HPV, good for  up to 3 years.  UTD Negative HIV 11/28/14   Depression screen PHQ 2/9 11/17/2019  Decreased Interest 0  Down, Depressed, Hopeless 0  PHQ - 2 Score 0    Past Medical History:  Diagnosis Date  . Allergy   . COPD  (chronic obstructive pulmonary disease) ([place])   . Gastropathy   . GERD (gastroesophageal reflux disease)   . Headache   . Hyperlipidemia   . Hypertension   . Hypothyroid   . Lumbar radiculopathy   . Lung nodule   . Sleep apnea    Past Surgical History:  Procedure Laterality Date  . APPENDECTOMY    . BLADDER SURGERY    . BREAST BIOPSY Left 06/06/2019   Lymph node US Bx, hydromarker. pending path   . COLONOSCOPY WITH PROPOFOL N/A 02/19/2015   Procedure: COLONOSCOPY WITH PROPOFOL;  Surgeon: Lollie Sails, MD;  Location: [facility] ENDOSCOPY;  Service: Endoscopy;  Laterality: N/A;  . COLONOSCOPY WITH PROPOFOL N/A 03/24/2018   Procedure: COLONOSCOPY WITH PROPOFOL;  Surgeon: Lollie Sails, MD;  Location: [facility] ENDOSCOPY;  Service: Endoscopy;  Laterality: N/A;  . ELBOW SURGERY Right 1982  . ESOPHAGOGASTRODUODENOSCOPY    . FOOT SURGERY Left    neuroma removal   Social History   Socioeconomic History  . Marital status: Single    Spouse name: Not on file  . Number of children: Not on file  . Years of education: Not on file  . Highest education level: Not on file  Occupational History  . Not on file  Tobacco Use  . Smoking status: Former Smoker    Types: Cigarettes    Quit date: 03/14/2018    Years since quitting: 1.6  . Smokeless tobacco: Former Network engineer and Sexual Activity  . Alcohol use: Yes  . Drug use: Not Currently  . Sexual activity: Not on file  Other Topics Concern  . Not on file  Social History Narrative  . Not on file   Social Determinants of Health   Financial Resource Strain:   . Difficulty of Paying Living Expenses: Not on file  Food Insecurity:   . Worried About Charity fundraiser in the Last Year: Not on file  . Ran Out of Food in the Last Year: Not on file  Transportation Needs:   . Lack of Transportation (Medical): Not on file  . Lack of Transportation (Non-Medical): Not on file  Physical Activity:   . Days of Exercise per Week: Not on file    . Minutes of Exercise per Session: Not on file  Stress:   . Feeling of Stress : Not on file  Social Connections:   . Frequency of Communication with Friends and Family: Not on file  . Frequency of Social Gatherings with Friends and Family: Not on file  . Attends Religious Services: Not on file  . Active Member of Clubs or Organizations: Not on file  . Attends Archivist Meetings: Not on file  . Marital Status: Not on file  Intimate Partner Violence:   . Fear of Current or Ex-Partner: Not on file  . Emotionally Abused: Not on file  . Physically Abused: Not on file  . Sexually Abused: Not on file   Family History  Problem Relation Age of Onset  . Breast cancer Mother 5  . BRCA 1/2 Mother        positive for Brca 2  . Cancer Mother        bladder  . Breast  cancer Maternal Aunt 46  . Breast cancer Maternal Grandfather        70's  . Alcohol abuse Father   . Cancer Father        brain, lung   Current Outpatient Medications on File Prior to Visit  Medication Sig  . acetaminophen (TYLENOL) 500 MG tablet Take by mouth.  . Ascorbic Acid (VITAMIN C) 1000 MG tablet Take 1,000 mg by mouth daily.  Marland Kitchen b complex vitamins tablet Take 1 tablet by mouth daily.  . Calcium Carbonate-Vitamin D 600-400 MG-UNIT per tablet Take 1 tablet by mouth daily.  . clobetasol ointment (TEMOVATE) 1.57 % Apply 1 application topically 2 (two) times daily as needed (rash).  Marland Kitchen esomeprazole (NEXIUM) 20 MG capsule Take 20 mg by mouth in the morning and at bedtime.  . Fluticasone-Umeclidin-Vilant (TRELEGY ELLIPTA) 100-62.5-25 MCG/INH AEPB Inhale into the lungs.  Marland Kitchen ibuprofen (ADVIL,MOTRIN) 200 MG tablet Take 400 mg by mouth 2 (two) times daily.  Marland Kitchen levothyroxine (SYNTHROID, LEVOTHROID) 88 MCG tablet Take 88 mcg by mouth daily before breakfast.  . liothyronine (CYTOMEL) 5 MCG tablet Take 5 mcg by mouth daily.  . Multiple Vitamin (MULTIVITAMIN WITH MINERALS) TABS tablet Take 1 tablet by mouth daily.  .  pramipexole (MIRAPEX) 1.5 MG tablet Take 0.375 mg by mouth at bedtime. Quarter tab nightly   . tacrolimus (PROTOPIC) 0.1 % ointment APPLY TO AFFECTED AREA TWICE A DAY AS DIRECTED  . fluticasone (FLONASE) 50 MCG/ACT nasal spray Place 2 sprays into both nostrils daily.   No current facility-administered medications on file prior to visit.    Review of Systems Per HPI unless specifically indicated above      Objective:    BP (!) 144/86 (BP Location: Left Arm, Cuff Size: Normal)   Pulse 94   Temp 98.4 F (36.9 C) (Temporal)   Resp 16   Ht '5\' 2"'  (1.575 m)   Wt 169 lb 12.8 oz (77 kg)   BMI 31.06 kg/m   Wt Readings from Last 3 Encounters:  11/17/19 169 lb 12.8 oz (77 kg)  03/24/18 136 lb (61.7 kg)  02/19/15 136 lb (61.7 kg)    Physical Exam Vitals and nursing note reviewed.  Constitutional:      General: She is not in acute distress.    Appearance: She is well-developed. She is not diaphoretic.     Comments: Well-appearing, comfortable, cooperative  HENT:     Head: Normocephalic and atraumatic.  Eyes:     General:        Right eye: No discharge.        Left eye: No discharge.     Conjunctiva/sclera: Conjunctivae normal.  Neck:     Thyroid: No thyromegaly.  Cardiovascular:     Rate and Rhythm: Normal rate and regular rhythm.     Heart sounds: Normal heart sounds. No murmur.  Pulmonary:     Effort: Pulmonary effort is normal. No respiratory distress.     Breath sounds: Normal breath sounds. No wheezing or rales.  Musculoskeletal:        General: Normal range of motion.     Cervical back: Normal range of motion and neck supple.  Lymphadenopathy:     Cervical: No cervical adenopathy.  Skin:    General: Skin is warm and dry.     Findings: No erythema or rash.  Neurological:     Mental Status: She is alert and oriented to person, place, and time.  Psychiatric:  Behavior: Behavior normal.     Comments: Well groomed, good eye contact, normal speech and thoughts        PAP Test Liquid-Based Screening-Low Risk(Routine)Resulted: 01/07/2018 2:01 PM [place] Component Name Value Ref Range  Case Report Gynecologic (Pap Test)Case: [id number]  Authorizing Provider:Grandis, Marene Lenz, MD Collected: 01/04/2018 1052 Ordering Location: [facility] Received:01/04/2018 2113 First Screen:Hassan, Gayla Medicus, CFIAC Rescreen:McNulty, Jeneen Rinks, CT(ASCP)  Specimen:PAP LIQUID BASED IMAGED SCRN, Cervical/Endocervical   Diagnostic Interpretation NEGATIVE FOR INTRAEPITHELIAL LESION OR MALIGNANCY   Comment Scant cellularity and partially obscuring lubricant. Two slides examined (sample re-prepped due to obscuring blood/inadequate cellularity on initial ThinPrep). High Risk HPV testing is pending.   Specimen Source Cervical/Endocervical   Statement of Adequacy Satisfactory for evaluation.   Endocervical Component Endocervical/transformation zone component is present.   Clinical Information None   Last Menstrual Period .   Material Submitted Liquid-based sample received in preservative vial. 1 ThinPrep slide prepared. 1 additional ThinPrep prepared (glacial acetic acid preparation for bloody sample).   Imager Disclaimer This slide was analyzed by the [facility] with manual cytotechnologist rescreen or review.   Disclaimer Comment: The pap test is a screening test for cervical cancer and precursor lesions with an inherent false negative rate. Regular screening and correlation with clinical findings is recommended.   Attestation All of the diagnostic evaluations on the enumerated specimens have been personally conducted by the pathologists involved in  the care of this patient as indicated by the electronic signatures above.   Specimen Collected on  Genital - Cervical/Endocervical 01/04/2018 10:52 AM     I have personally reviewed the radiology report from 11/17/19 CT Chest wo contrast.  CLINICAL DATA:  History of pulmonary nodule seen on a chest CT scan 11/11/2018.  EXAM: CT CHEST WITHOUT CONTRAST  TECHNIQUE: Multidetector CT imaging of the chest was performed following the standard protocol without IV contrast.  COMPARISON:  Chest CT 11/11/2018.  FINDINGS: Cardiovascular: No significant vascular findings. Normal heart size. No pericardial effusion. A few atherosclerotic calcifications of the aorta are noted.  Mediastinum/Nodes: No enlarged mediastinal or axillary lymph nodes. Thyroid gland, trachea, and esophagus demonstrate no significant findings. Small hiatal hernia is seen.  Lungs/Pleura: Again seen is a 0.5 cm nodule along the undersurface of the minor fissure which has a flat configuration on sagittal imaging and is consistent with a benign lymph node. There is some emphysematous disease present. Mild dependent atelectasis noted. No pleural effusion.  Upper Abdomen: Stones in the gallbladder are unchanged. No evidence of cholecystitis. Small left adrenal adenoma is unchanged. Upper abdomen otherwise negative.  Musculoskeletal: No acute or focal abnormality.  IMPRESSION: No change in a 0.5 cm nodule along the undersurface of the minor fissure consistent with a benign subpleural lymph node. No follow-up imaging is recommended.  Gallstones without evidence of cholecystitis.  Aortic Atherosclerosis (ICD10-I70.0) and Emphysema (ICD10-J43.9).   Electronically Signed   By: Inge Rise M.D.   On: 11/17/2019 14:01  Results for orders placed or performed in visit on 11/17/19  HM HIV SCREENING LAB  Result Value Ref Range   HM HIV Screening Negative - Validated   HM PAP SMEAR  Result Value Ref  Range   HM Pap smear      Negative for intraepithelial malignancy and negative High Risk HPV      Assessment & Plan:   Problem List Items Addressed This Visit    Restless leg syndrome   OSA on CPAP - Primary  Obesity (BMI 30.0-34.9)   Myalgia due to statin   Mixed hyperlipidemia   Former smoker   Drug-induced constipation   Centrilobular emphysema ([place])   Relevant Medications   Fluticasone-Umeclidin-Vilant (TRELEGY ELLIPTA) 100-62.5-25 MCG/INH AEPB   Acquired hypothyroidism    Other Visit Diagnoses    Encounter for screening mammogram for malignant neoplasm of breast       Relevant Orders   MM DIGITAL SCREENING BILATERAL   Encounter to establish care with new doctor          #Hypothyroid Prior low normal TSH 03/2019 Stable on Levothyroxine 56mg daily Will continue dose for now, recheck in 4-5 months at physical  #OSA on CPAP Controlled, continue CPAP  #RLS On mirapex low dose 0.3713mnightly per Pulm  #Centrilobular Emphysema [facility], possible mild asthma component Currently stable. Prior negative cardiac workup  #Hyperlipidemia Prior chronic elevated total cholesterol, LDL, TG Due for lipids at next visit Failed statins due to myalgia, see HPI for list, she has others she may have tried Consider PCSK9 inhibitor, consider coverage  #Fam history BRCA2 Continue Mammogram / Pap smear screening  No orders of the defined types were placed in this encounter.    Follow up plan: Return in about 5 months (around 04/18/2020) for Annual Physical.  Future labs ordered 04/15/20  AlNobie PutnamDOLundroup 11/17/2019, 3:19 PM

## 2019-11-22 DIAGNOSIS — M79674 Pain in right toe(s): Secondary | ICD-10-CM | POA: Diagnosis not present

## 2019-11-22 DIAGNOSIS — M79675 Pain in left toe(s): Secondary | ICD-10-CM | POA: Diagnosis not present

## 2019-11-22 DIAGNOSIS — L600 Ingrowing nail: Secondary | ICD-10-CM | POA: Diagnosis not present

## 2019-11-30 ENCOUNTER — Ambulatory Visit
Admission: RE | Admit: 2019-11-30 | Discharge: 2019-11-30 | Disposition: A | Discharge status: Home / Self Care | Payer: BC Managed Care – PPO | Source: Ambulatory Visit | Attending: Family Medicine | Admitting: Family Medicine

## 2019-11-30 DIAGNOSIS — Z1231 Encounter for screening mammogram for malignant neoplasm of breast: Secondary | ICD-10-CM

## 2019-11-30 IMAGING — MG DIGITAL SCREENING BILAT W/ TOMO W/ CAD
8 series · 8 of 24 positions shown · non-contrast
Comparison: Previous exam(s).

CLINICAL DATA: Screening.

EXAM:
DIGITAL SCREENING BILATERAL MAMMOGRAM WITH TOMO AND CAD

[R CC synth-2D]
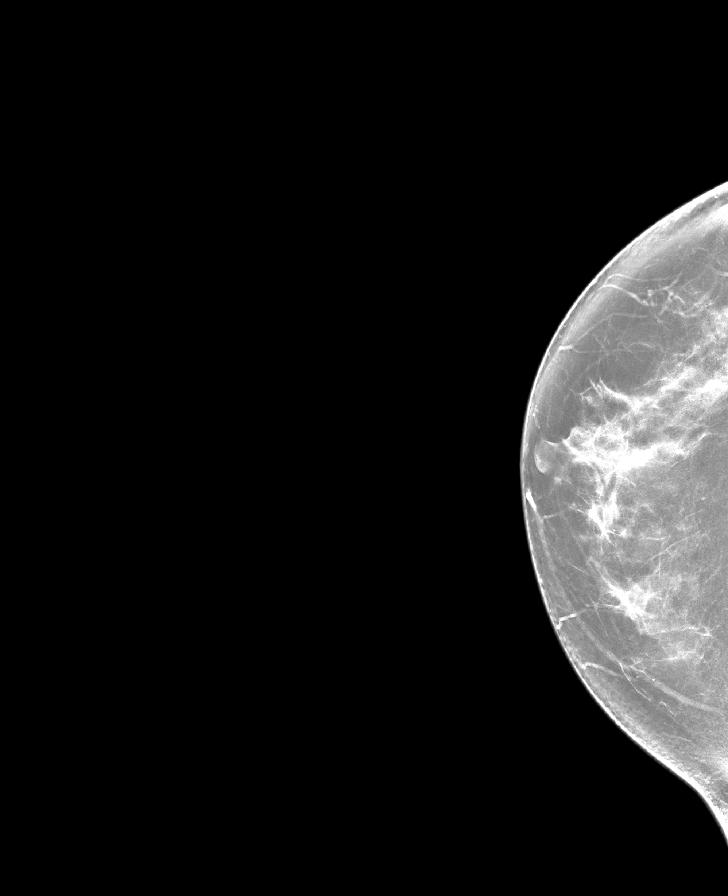

[L CC synth-2D]
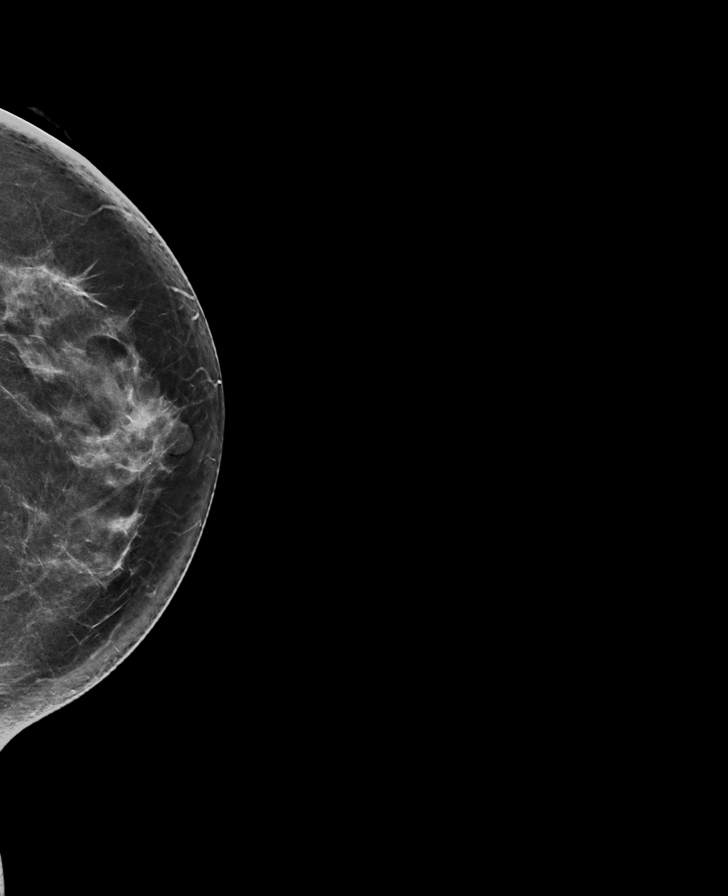

[L MLO synth-2D]
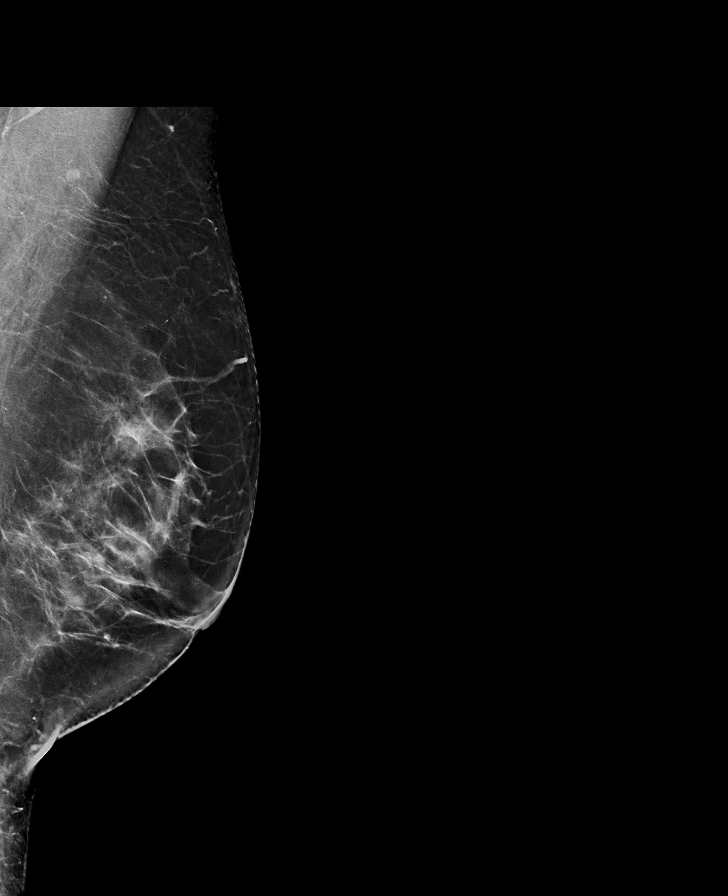

[R MLO synth-2D]
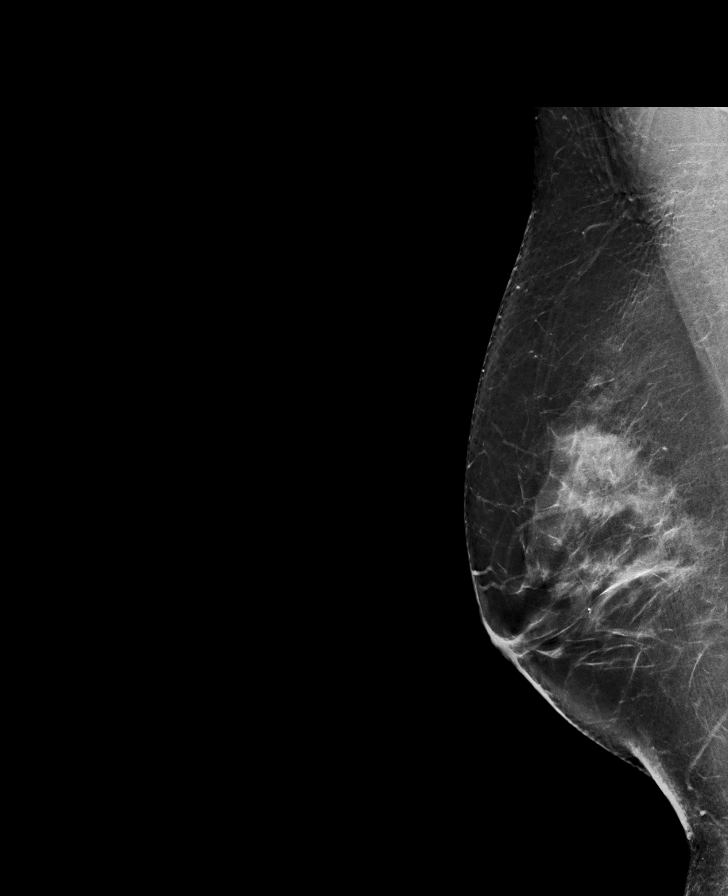

[L CC tomo · tomo slice 39/77.0]
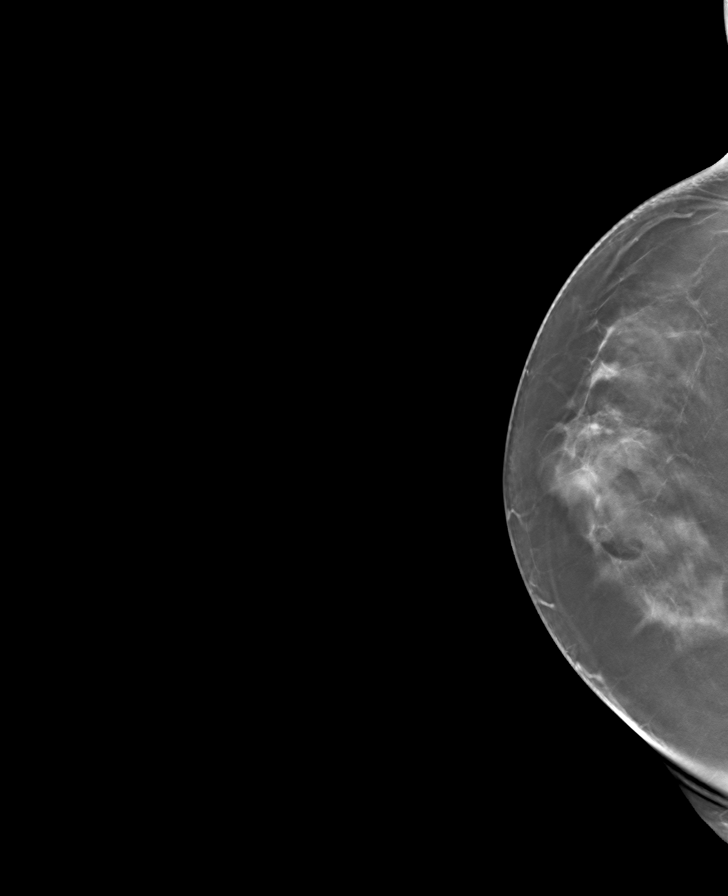

[R CC tomo · tomo slice 39/76.0]
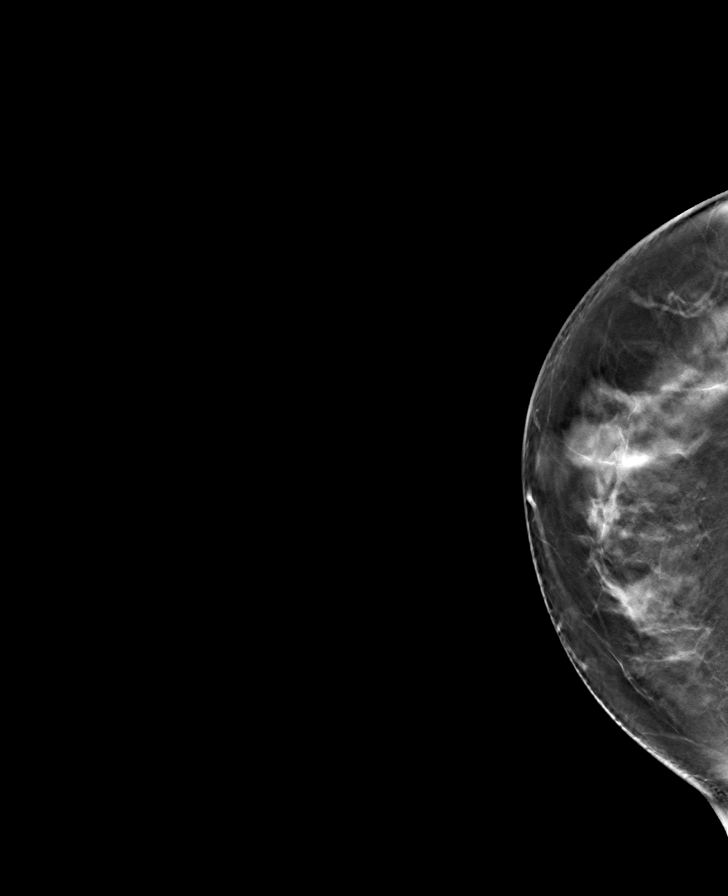

[R MLO tomo · tomo slice 45/89.0]
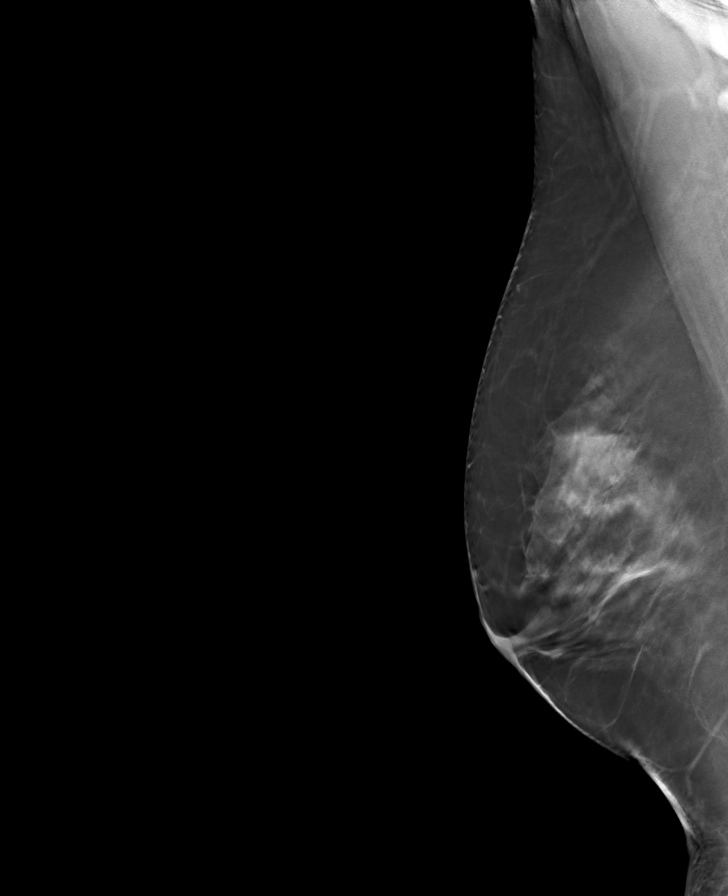

[L MLO tomo · tomo slice 41/82.0]
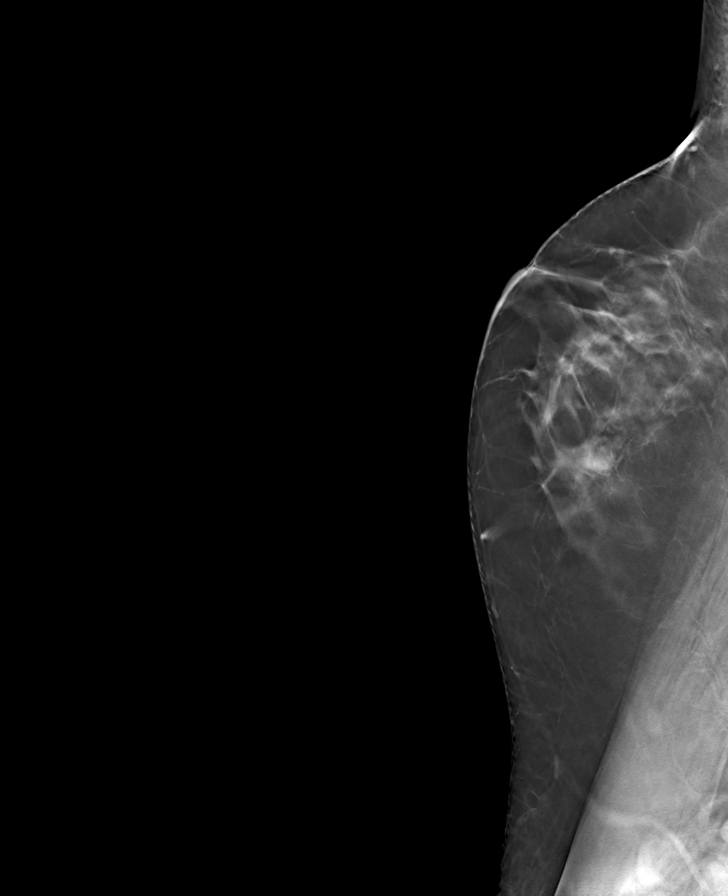

[8 of 24 positions shown; findings below may reference images not displayed]

ACR Breast Density Category c: The breast tissue is heterogeneously
dense, which may obscure small masses.
FINDINGS: There are no findings suspicious for malignancy. Images were
processed with CAD.
IMPRESSION: No mammographic evidence of malignancy. A result letter of this
screening mammogram will be mailed directly to the patient.

RECOMMENDATION:
1.  Screening mammogram in one year. (Code:[G2])

2. The patient has strong family history of breast cancer as well as
a mother who is positive for BRCA 2 gene mutation. Annual high risk
MRI is recommended. Per American Cancer Society guidelines, if the
patient has a calculated lifetime risk of developing breast cancer
of greater than 20%, annual screening MRI of the breasts would be
recommended at the time of screening mammography.

BI-RADS CATEGORY  1: Negative.

## 2019-12-05 DIAGNOSIS — J432 Centrilobular emphysema: Secondary | ICD-10-CM | POA: Diagnosis not present

## 2019-12-05 DIAGNOSIS — E663 Overweight: Secondary | ICD-10-CM | POA: Diagnosis not present

## 2019-12-13 DIAGNOSIS — M216X2 Other acquired deformities of left foot: Secondary | ICD-10-CM | POA: Diagnosis not present

## 2019-12-13 DIAGNOSIS — L600 Ingrowing nail: Secondary | ICD-10-CM | POA: Diagnosis not present

## 2019-12-13 DIAGNOSIS — M79672 Pain in left foot: Secondary | ICD-10-CM | POA: Diagnosis not present

## 2020-01-02 DIAGNOSIS — R208 Other disturbances of skin sensation: Secondary | ICD-10-CM | POA: Diagnosis not present

## 2020-01-24 ENCOUNTER — Encounter: Payer: Self-pay | Admitting: Dermatology

## 2020-01-29 ENCOUNTER — Other Ambulatory Visit: Payer: Self-pay

## 2020-01-29 ENCOUNTER — Ambulatory Visit: Payer: BC Managed Care – PPO | Admitting: Dermatology

## 2020-01-29 DIAGNOSIS — L309 Dermatitis, unspecified: Secondary | ICD-10-CM | POA: Diagnosis not present

## 2020-01-29 MED ORDER — DESOXIMETASONE 0.25 % EX CREA: TOPICAL_CREAM | CUTANEOUS | 2 refills | Status: DC

## 2020-01-29 MED ORDER — PREDNISONE 5 MG PO TABS: ORAL_TABLET | ORAL | 0 refills | Status: DC

## 2020-01-29 MED ORDER — DUPIXENT 300 MG/2ML ~~LOC~~ SOAJ: 600.0000 mg | Freq: Once | SUBCUTANEOUS | 0 refills | Status: AC

## 2020-01-29 NOTE — Patient Instructions (Addendum)
Topical steroids (such as triamcinolone, fluocinolone, fluocinonide, mometasone, clobetasol, halobetasol, betamethasone, hydrocortisone) can cause thinning and lightening of the skin if they are used for too long in the same area. Your physician has selected the right strength medicine for your problem and area affected on the body. Please use your medication only as directed by your physician to prevent side effects.   Risks of prednisone taper discussed including mood irritability, insomnia, weight gain, stomach ulcers, increased risk of infection, increased blood sugar (diabetes), hypertension, osteoporosis with long-term or frequent use, and rare risk of avascular necrosis of the hip  3 Week Prednisone Taper  You will be given a prescription for 150 tablets of oral Prednisone. It is very important that you take this according to the exact schedule provided below. This type of regimen for taking medication is often called a "taper", because your dosage will steadily decrease over a two week period until it is discontinued altogether.  ALWAYS take this medicine with food to prevent it from irritating your stomach. You should also take your Prednisone during morning hours.  Call the clinic at [phone] if you gain more than two pounds in one day, notice swelling anywhere on your body, have shortness of breath, black or red bowel movements, brown or red vomitus, desire to drink large amounts of fluids, a fever, or extreme weakness.   Oral Prednisone over Three Weeks  Day  Week 1  Week 2  Week 3   1  12  tablets  9 tablets  5 tablets   2  12 tablets  8 tablets  5 tablets   3  11 tablets  8 tablets  4 tablets   4  11 tablets  7 tablets  4 tablets   5  10 tablets  7 tablets  3 tablets   6  10 tablets  6 tablets  2 tablets   7  9 tablets  6 tablets  1 tablet

## 2020-01-29 NOTE — Progress Notes (Signed)
   Follow-Up Visit   Subjective  Sherry Oliver is a 59 y.o. female who presents for the following: Rash (all over body, has gotten worse since it has gotten warm, has been using tacrolimus, Dexamethasone cream, cetaphil cream adn vaseline with gloves for hands. Very red, scaly and itchy today. Patient does complain of pain in B/L knees).  Pt has multiple contact allergies- see allergy list.  Has list of products she can use.  Patient has used dexamethasone, diprolene, prednisone, protopic, Elidel, Clobetasol, allergic to Eucrisa (propylene glycol).  We discussed starting Dupixent at her last visit, but it was never sent in.  The following portions of the chart were reviewed this encounter and updated as appropriate:     Review of Systems:  No other skin or systemic complaints except as noted in HPI or Assessment and Plan.  Objective  Well appearing patient in no apparent distress; mood and affect are within normal limits.  A focused examination was performed including arms, hands, legs, feet, chest. Relevant physical exam findings are noted in the Assessment and Plan.  Objective  BL hands, feet, arms, legs: Erythematous scaly macules/patches Diffuse on palms and soles with hyperkeratosis and fissuring Pink scaly papules on arms and axilla, chest, eyelids   Assessment & Plan  Eczema, unspecified type BL hands, feet, arms, legs  Atopic with severe flare Will start 3 week taper of prednisone, instructions given Risks of prednisone taper discussed including mood irritability, insomnia, weight gain, stomach ulcers, increased risk of infection, increased blood sugar (diabetes), hypertension, osteoporosis with long-term or frequent use, and rare risk of avascular necrosis of the hip.    Continue topical desoximethasone 0.25% cream bid Vanicream ointment given prn Will send in [place] [facility] to [facility] ([place]) is a treatment given by injection for adults with  moderate-to-severe atopic dermatitis. It is given as 2 injections at the first dose followed by 1 injection ever 2 weeks thereafter.  Potential side effects include allergic reaction, herpes infections, injection site reactions and conjunctivitis (inflammation of the eyes).   desoximetasone (TOPICORT) 0.25 % cream - BL hands, feet, arms, legs  predniSONE (DELTASONE) 5 MG tablet - BL hands, feet, arms, legs  Dupilumab (DUPIXENT) 300 MG/2ML SOPN - BL hands, feet, arms, legs  Return 4-6 weeks, for Eczema.  Marene Lenz, CMA, am acting as scribe for Brendolyn Patty, MD .  Documentation: I have reviewed the above documentation for accuracy and completeness, and I agree with the above.  Brendolyn Patty MD

## 2020-02-01 DIAGNOSIS — I10 Essential (primary) hypertension: Secondary | ICD-10-CM | POA: Diagnosis not present

## 2020-02-18 ENCOUNTER — Other Ambulatory Visit: Payer: Self-pay | Admitting: Dermatology

## 2020-02-18 DIAGNOSIS — L309 Dermatitis, unspecified: Secondary | ICD-10-CM

## 2020-02-20 ENCOUNTER — Ambulatory Visit: Payer: BC Managed Care – PPO | Admitting: Dermatology

## 2020-02-21 DIAGNOSIS — G4733 Obstructive sleep apnea (adult) (pediatric): Secondary | ICD-10-CM | POA: Diagnosis not present

## 2020-02-22 ENCOUNTER — Other Ambulatory Visit: Payer: Self-pay | Admitting: Dermatology

## 2020-03-06 ENCOUNTER — Other Ambulatory Visit: Payer: Self-pay

## 2020-03-06 ENCOUNTER — Ambulatory Visit: Payer: BC Managed Care – PPO | Admitting: Dermatology

## 2020-03-06 DIAGNOSIS — L258 Unspecified contact dermatitis due to other agents: Secondary | ICD-10-CM

## 2020-03-06 DIAGNOSIS — L2089 Other atopic dermatitis: Secondary | ICD-10-CM | POA: Diagnosis not present

## 2020-03-06 MED ORDER — DUPILUMAB 300 MG/2ML ~~LOC~~ SOAJ
600.0000 mg | Freq: Once | SUBCUTANEOUS | Status: AC
  Administered 2020-03-18: 600 mg via SUBCUTANEOUS

## 2020-03-06 NOTE — Progress Notes (Signed)
   Follow-Up Visit   Subjective  Sherry Oliver is a 59 y.o. female who presents for the following: Dermatitis.  Patient here today to start Dupixent injections for Atopic Dermatitis. Patient is also using tacrolimus, topicort and has taken prednisone in the past. She just finished a prednisone taper.  Her hands did not improve and she has a lot of discomfort.  And the rash on her arms didn't clear up either.  She has had Patch testing in past (2016) at [facility] and has multiple contact allergies.  She has info sheets that she uses to avoid the products she is allergic too.  The following portions of the chart were reviewed this encounter and updated as appropriate:      Review of Systems:  No other skin or systemic complaints except as noted in HPI or Assessment and Plan.  Objective  Well appearing patient in no apparent distress; mood and affect are within normal limits.  A focused examination was performed including arms, feet, hands. Relevant physical exam findings are noted in the Assessment and Plan.  Objective  Bilateral hands, arms: Extensive hyperkeratosis with cracking on bilateral palms and fingers. Pink scaly patches on arms.  Objective  Arms, hands: Extensive hyperkeratosis with cracking on bilateral palms and fingers. Pink scaly patches on arms.   Assessment & Plan  Other atopic dermatitis Bilateral hands, arms  Continue tacrolimus BID Continue topicort BID  Pt has Dupixent injections ready to go, but will hold off starting until after doing repeat patch test.  Last patch test was 2016 and she had multiple positive allergens.  Ordered Medications: Dupilumab SOPN 600 mg  Contact dermatitis due to other agent, unspecified contact dermatitis type Arms, hands  Place True patch testing x 36 next Monday, f/up Wed, visit with me on following Monday for final reading and start [place]  Return in about 5 days (around 03/11/2020) for Patch testing application with  nurse.  Graciella Belton, RMA, am acting as scribe for Brendolyn Patty, MD .  Documentation: I have reviewed the above documentation for accuracy and completeness, and I agree with the above.  Brendolyn Patty MD

## 2020-03-06 NOTE — Patient Instructions (Addendum)
Recommend daily broad spectrum sunscreen SPF 30+ to sun-exposed areas, reapply every 2 hours as needed. Call for new or changing lesions.  

## 2020-03-11 ENCOUNTER — Ambulatory Visit: Payer: BC Managed Care – PPO

## 2020-03-11 ENCOUNTER — Other Ambulatory Visit: Payer: Self-pay

## 2020-03-11 DIAGNOSIS — L209 Atopic dermatitis, unspecified: Secondary | ICD-10-CM | POA: Diagnosis not present

## 2020-03-11 NOTE — Progress Notes (Signed)
True Test X36 applied to patients back. Patient had patch testing done in 2016. She was advised do not soak or get back wet. Patient to be careful with sweating. Do not scratch back with possible reactions.   Patient to follow up with me on Wednesday to remove panels 1-3.

## 2020-03-11 NOTE — Patient Instructions (Signed)

## 2020-03-13 ENCOUNTER — Other Ambulatory Visit: Payer: Self-pay

## 2020-03-13 ENCOUNTER — Ambulatory Visit (INDEPENDENT_AMBULATORY_CARE_PROVIDER_SITE_OTHER): Payer: BC Managed Care – PPO

## 2020-03-13 DIAGNOSIS — L209 Atopic dermatitis, unspecified: Secondary | ICD-10-CM

## 2020-03-13 NOTE — Progress Notes (Signed)
Patient here today for day 3 of patch testing and to read/remove True Test X36.  Panels removed to find site numbers 5 and 34 extremely positive. Site number 1 was a questionable reaction at the time.  Patient advised do not scrub or soak back until after reading next Monday with Dr. Brendolyn Patty.   Patient will call with any questions or concerns.

## 2020-03-18 ENCOUNTER — Encounter: Payer: Self-pay | Admitting: Dermatology

## 2020-03-18 ENCOUNTER — Other Ambulatory Visit: Payer: Self-pay

## 2020-03-18 ENCOUNTER — Ambulatory Visit: Payer: BC Managed Care – PPO | Admitting: Dermatology

## 2020-03-18 DIAGNOSIS — L239 Allergic contact dermatitis, unspecified cause: Secondary | ICD-10-CM | POA: Diagnosis not present

## 2020-03-18 DIAGNOSIS — L209 Atopic dermatitis, unspecified: Secondary | ICD-10-CM

## 2020-03-18 NOTE — Progress Notes (Signed)
   Follow-Up Visit   Subjective  Sherry Oliver is a 59 y.o. female who presents for the following: Follow-up  Patient presents today for follow up from [facility] 03/06/20 for Patch testing and for Initial Dupixent injections. She had several positive reactions come up on back which are red and itchy.  The following portions of the chart were reviewed this encounter and updated as appropriate:      Review of Systems:  No other skin or systemic complaints except as noted in HPI or Assessment and Plan.  Objective  Well appearing patient in no apparent distress; mood and affect are within normal limits.  A focused examination was performed including B/L hands and arms. Relevant physical exam findings are noted in the Assessment and Plan.  Objective  B/L hands and arms: Diffuse erythema with hyperkeratosis b/l palms/fingers, pink scaly patches left antecubital, BL forearms  Back- Positive reaction to #1 Nickel Sulfate (2+), #4 Potassium Dichromate (2+), #5 Caine Mix (2+, prior testing was specific for Benzocaine NOT lidocaine), #20 Phenylenediamine (1+),and #34 Parthenolide (2+) on Tru test 36   Assessment & Plan  Atopic dermatitis, unspecified type B/L hands and arms  Severe on Hands, With Allergic Contact Dermatitis (prior allergens reconfirmed, no new allergen reactions).  Patient has already been avoiding these allergens.  She will continue to use white cotton gloves/followed by vinyl gloves when gardening.  Patient may look into getting some of her old metal dental work replaced with porcelain.  Positive reaction to #1 Nickel Sulfate, #4 Potassium Dichromate, #5 Caine Mix (prior testing was specific for Benzocaine NOT lidocaine), #20 Phenylenediamine,and #34 Parthenolide on Tru test 36, Tru test allergen handouts given to each above positive.  Will start Fleming today (pt brought in her own Rx- 2 syringes- which were mailed to her home), Loading dose injections x 2 today bilateral  arms, Patient tolerated well Lot [id number] EXP 09/13/2021  Continue Topicort 0.25% cream bid Continue Protopic 0.1% ointment bid Continue Vanicream ointment prn  Return in about 3 months (around 06/18/2020) for 2 week Nurse visit Dupixent,  3 month follow up Dr. Nicole Kindred AD.  Marene Lenz, CMA, am acting as scribe for Brendolyn Patty, MD .  Documentation: I have reviewed the above documentation for accuracy and completeness, and I agree with the above.  Brendolyn Patty MD

## 2020-03-22 ENCOUNTER — Other Ambulatory Visit: Payer: Self-pay

## 2020-03-22 ENCOUNTER — Ambulatory Visit (INDEPENDENT_AMBULATORY_CARE_PROVIDER_SITE_OTHER): Payer: BC Managed Care – PPO | Admitting: Family Medicine

## 2020-03-22 ENCOUNTER — Encounter: Payer: Self-pay | Admitting: Family Medicine

## 2020-03-22 VITALS — BP 149/81 | HR 94 | Temp 98.0°F | Resp 16 | Ht 62.0 in | Wt 170.6 lb

## 2020-03-22 DIAGNOSIS — S83412A Sprain of medial collateral ligament of left knee, initial encounter: Secondary | ICD-10-CM

## 2020-03-22 DIAGNOSIS — M170 Bilateral primary osteoarthritis of knee: Secondary | ICD-10-CM | POA: Diagnosis not present

## 2020-03-22 DIAGNOSIS — M25462 Effusion, left knee: Secondary | ICD-10-CM | POA: Diagnosis not present

## 2020-03-22 DIAGNOSIS — M25562 Pain in left knee: Secondary | ICD-10-CM

## 2020-03-22 NOTE — Progress Notes (Signed)
Subjective:    Patient ID: Sherry Oliver, female    DOB: November 25, 1960, 59 y.o.   MRN: [id number]  Sherry Oliver is a 59 y.o. female presenting on 03/22/2020 for Knee Pain (strained left knee, swollen, pain, hurts with ROM- treating w/ice and tylenol --left worst for week and half)    HPI   Left Knee Pain Reports back in 01/2020 about 2 months ago approximately, she said both knees started bothering her with pain and rash breaking out dry skin. She has had issue with both bottom of feet "dermatitis" caused her to walk abnormally at times. No acute injury or sprain or twist or fall injury. - She says some days are better than others, some are worse, on better days still has some pain. Describes knee pain mostly in Left knee medial aspect fairly constant aching pain, worse with squatting bending walking up stairs and activity make it worse, with some swelling episodically worse Left than right. Right knee "pops" some. Left does not. - worse with recent kitchen remodeling kneeling - She uses ice packs and Tylenol x 3 - has the OTC Knee soft brace immobilizer, just started to try - No prior imaging x-rays, procedure, injury to knees before. - Followed by Dr Brendolyn Patty at [facility], she was treated with Prednisone 5mg  dose with taper instructions over 3 weeks. to help her skin, and it did improve, but then did return again after she finished medicine. While on Prednisone - she did experience significant improvement.   Depression screen PHQ 2/9 11/17/2019  Decreased Interest 0  Down, Depressed, Hopeless 0  PHQ - 2 Score 0    Social History   Tobacco Use  . Smoking status: Former Smoker    Types: Cigarettes    Quit date: 03/14/2018    Years since quitting: 2.0  . Smokeless tobacco: Former Network engineer  . Vaping Use: Never used  Substance Use Topics  . Alcohol use: Yes  . Drug use: Not Currently    Review of Systems Per HPI unless specifically indicated above      Objective:    BP (!) 149/81   Pulse 94   Temp 98 F (36.7 C) (Temporal)   Resp 16   Ht 5\' 2"  (1.575 m)   Wt 170 lb 9.6 oz (77.4 kg)   SpO2 96%   BMI 31.20 kg/m   Wt Readings from Last 3 Encounters:  03/22/20 170 lb 9.6 oz (77.4 kg)  11/17/19 169 lb 12.8 oz (77 kg)  03/24/18 136 lb (61.7 kg)    Physical Exam Vitals and nursing note reviewed.  Constitutional:      General: She is not in acute distress.    Appearance: She is well-developed. She is not diaphoretic.     Comments: Well-appearing, comfortable, cooperative  HENT:     Head: Normocephalic and atraumatic.  Eyes:     General:        Right eye: No discharge.        Left eye: No discharge.     Conjunctiva/sclera: Conjunctivae normal.  Cardiovascular:     Rate and Rhythm: Normal rate.  Pulmonary:     Effort: Pulmonary effort is normal.  Musculoskeletal:     Comments: Left Knee Inspection: Normal appearance and symmetrical. No ecchymosis or effusion. Palpation: Non-tender. Mild +TTP Left knee only medial  joint line. Fine crepitus ROM: Full active ROM bilaterally Special Testing: Lachman / Valgus/Varus tests negative with intact ligaments (ACL, MCL,  LCL). McMurray mild pain without pop medial, standing thessaly mild pain without instability Strength: 5/5 intact knee flex/ext, ankle dorsi/plantarflex Neurovascular: distally intact sensation light touch and pulses   Skin:    General: Skin is warm and dry.     Findings: No erythema or rash.  Neurological:     Mental Status: She is alert and oriented to person, place, and time.  Psychiatric:        Behavior: Behavior normal.     Comments: Well groomed, good eye contact, normal speech and thoughts    Results for orders placed or performed in visit on 11/17/19  HM HIV SCREENING LAB  Result Value Ref Range   HM HIV Screening Negative - Validated   HM PAP SMEAR  Result Value Ref Range   HM Pap smear      Negative for intraepithelial malignancy and negative High  Risk HPV      Assessment & Plan:   Problem List Items Addressed This Visit    None    Visit Diagnoses    Pain and swelling of left knee    -  Primary   Relevant Medications   diclofenac Sodium (VOLTAREN) 1 % GEL   Sprain of medial collateral ligament of left knee, initial encounter       Relevant Medications   diclofenac Sodium (VOLTAREN) 1 % GEL   Primary osteoarthritis of both knees       Relevant Medications   diclofenac Sodium (VOLTAREN) 1 % GEL      Acute vs Subacute on L medial knee pain without significant swelling - more consistent with Knee Sprain No acute injury Possible concern for meniscus but exam doesn't support Some repetitive strain - Able to bear weight, no knee instability, mechanical locking - No prior history of knee surgery, arthroscopy - Inadequate conservative therapy   Plan: 1. Start anti-inflammatory trial with otc topical Voltaren gel 2. Start Tylenol 500-1000mg  per dose TID PRN breakthrough 3. RICE therapy (rest, ice, compression Knee sleeve, elevation) for swelling, activity modification 4. Hold X-rays for now 5. Follow-up 2 - 4 weeks, if still worsening, consider X-ray, steroid injection and referral to PT vs Ortho for further eval, may need MRI if concern remains for meniscus / ligament injury    No orders of the defined types were placed in this encounter.     Follow up plan: Return in about 4 weeks (around 04/19/2020), or if symptoms worsen or fail to improve, for knee pain, keep upcoming physical.   Nobie Putnam, DO [facility] 03/22/2020, 3:13 PM

## 2020-03-22 NOTE — Patient Instructions (Addendum)
Thank you for coming to the office today.  You most likely have a  knee sprain - this is an injury to the ligaments supporting the knee, it can be a minor injury or small tear, rarely it can turn out to be a larger tear to the ligament if it does not improve, we cannot rule this out. Also I am concerned about the possibility of a meniscus injury (the cartilage shock absorber within the knee)  No X-ray today we can reconsider in future  - I recommend a Knee Brace to support your knee and limit motion of the knee to help it heal, avoid excess weight bearing and repetitive activities on it  Recommend trial of Anti-inflammatory with topical Voltaren or generic Diclofenac 2-3 times as needed. - DO NOT TAKE any ibuprofen, advil, aleve, motrin while you are taking this medicine - It is safe to take Tylenol Ext Str 500mg  tabs - take 1 to 2 (max dose 1000mg ) every 6 hours as needed for breakthrough pain, max 24 hour daily dose is 6 to 8 tablets or 4000mg    Use RICE therapy: - R - Rest / relative rest with activity modification avoid overuse and frequent bending or pressure on bent knee - I - Ice packs (make sure you use a towel or sock / something to protect skin) - C - Compression with ACE wrap or immobilizer apply pressure and reduce swelling allowing more support - E - Elevation - if significant swelling, lift leg above heart level (toes above your nose) to help reduce swelling, most helpful at night after day of being on your feet  If not improving within next 2-4 week I would recommend X-ray or injection and then possibly referral to orthopedics for 2nd opinion  [facility] (formerly [facility]) Address: [address] [place], [place] [address] Hours:  9AM-5PM Phone: [phone]  Orthopedics should also have an Urgent Care Walk In Availability for after hours.  Please schedule a Follow-up Appointment to: Return in about 4 weeks (around 04/19/2020), or if symptoms worsen or  fail to improve, for knee pain, keep upcoming physical.  If you have any other questions or concerns, please feel free to call the office or send a message through [place]. You may also schedule an earlier appointment if necessary.  Additionally, you may be receiving a survey about your experience at our office within a few days to 1 week by e-mail or mail. We value your feedback.  Nobie Putnam, DO [place]

## 2020-04-01 ENCOUNTER — Ambulatory Visit: Payer: BC Managed Care – PPO

## 2020-04-01 ENCOUNTER — Other Ambulatory Visit: Payer: Self-pay

## 2020-04-01 DIAGNOSIS — L2089 Other atopic dermatitis: Secondary | ICD-10-CM

## 2020-04-01 MED ORDER — DUPILUMAB 300 MG/2ML ~~LOC~~ SOAJ
300.0000 mg | Freq: Once | SUBCUTANEOUS | Status: AC
  Administered 2020-04-01: 300 mg via SUBCUTANEOUS

## 2020-04-01 NOTE — Progress Notes (Signed)
   Follow-Up Visit   Subjective  Sherry Oliver is a 59 y.o. female who presents for the following: Follow-up (Atopic dermatitis - Patient is here for Dupixent injection).    The following portions of the chart were reviewed this encounter and updated as appropriate:      Review of Systems:  No other skin or systemic complaints except as noted in HPI or Assessment and Plan.  Objective  Well appearing patient in no apparent distress; mood and affect are within normal limits.  A focused examination was performed including left thigh. Relevant physical exam findings are noted in the Assessment and Plan.    Assessment & Plan  Other atopic dermatitis Arms and hands  Severe on Hands, With Allergic Contact Dermatitis (prior allergens reconfirmed, no new allergen reactions).  Patient has already been avoiding these allergens.   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

## 2020-04-15 ENCOUNTER — Other Ambulatory Visit: Payer: Self-pay

## 2020-04-15 ENCOUNTER — Other Ambulatory Visit: Payer: BC Managed Care – PPO

## 2020-04-15 DIAGNOSIS — Z0000 Encounter for general adult medical examination without abnormal findings: Secondary | ICD-10-CM

## 2020-04-15 DIAGNOSIS — R7309 Other abnormal glucose: Secondary | ICD-10-CM

## 2020-04-15 DIAGNOSIS — E039 Hypothyroidism, unspecified: Secondary | ICD-10-CM | POA: Diagnosis not present

## 2020-04-15 DIAGNOSIS — E782 Mixed hyperlipidemia: Secondary | ICD-10-CM | POA: Diagnosis not present

## 2020-04-15 DIAGNOSIS — E669 Obesity, unspecified: Secondary | ICD-10-CM

## 2020-04-16 ENCOUNTER — Ambulatory Visit (INDEPENDENT_AMBULATORY_CARE_PROVIDER_SITE_OTHER): Payer: BC Managed Care – PPO

## 2020-04-16 ENCOUNTER — Other Ambulatory Visit: Payer: Self-pay

## 2020-04-16 DIAGNOSIS — L209 Atopic dermatitis, unspecified: Secondary | ICD-10-CM | POA: Diagnosis not present

## 2020-04-16 LAB — HEMOGLOBIN A1C
Hgb A1c MFr Bld: 5.9 % of total Hgb — ABNORMAL HIGH (ref <5.7)
Mean Plasma Glucose: 123 (calc)
eAG (mmol/L): 6.8 (calc)

## 2020-04-16 LAB — COMPLETE METABOLIC PANEL WITH GFR
AG Ratio: 1.3 (calc) (ref 1.0–2.5)
ALT: 22 U/L (ref 6–29)
AST: 23 U/L (ref 10–35)
Albumin: 4.3 g/dL (ref 3.6–5.1)
Alkaline phosphatase (APISO): 75 U/L (ref 37–153)
BUN: 13 mg/dL (ref 7–25)
CO2: 27 mmol/L (ref 20–32)
Calcium: 10.1 mg/dL (ref 8.6–10.4)
Chloride: 103 mmol/L (ref 98–110)
Creat: 0.95 mg/dL (ref 0.50–0.99)
GFR, Est African American: 75 mL/min/{1.73_m2} (ref >=60)
GFR, Est Non African American: 65 mL/min/{1.73_m2} (ref >=60)
Globulin: 3.4 g/dL (calc) (ref 1.9–3.7)
Glucose, Bld: 101 mg/dL — ABNORMAL HIGH (ref 65–99)
Potassium: 4.2 mmol/L (ref 3.5–5.3)
Sodium: 141 mmol/L (ref 135–146)
Total Bilirubin: 0.5 mg/dL (ref 0.2–1.2)
Total Protein: 7.7 g/dL (ref 6.1–8.1)

## 2020-04-16 LAB — TSH: TSH: 1.73 mIU/L (ref 0.40–4.50)

## 2020-04-16 LAB — LIPID PANEL
Cholesterol: 269 mg/dL — ABNORMAL HIGH (ref <200)
HDL: 43 mg/dL — ABNORMAL LOW (ref >=50)
LDL Cholesterol (Calc): 168 mg/dL (calc) — ABNORMAL HIGH
Non-HDL Cholesterol (Calc): 226 mg/dL (calc) — ABNORMAL HIGH (ref <130)
Total CHOL/HDL Ratio: 6.3 (calc) — ABNORMAL HIGH (ref <5.0)
Triglycerides: 343 mg/dL — ABNORMAL HIGH (ref <150)

## 2020-04-16 LAB — CBC WITH DIFFERENTIAL/PLATELET
Absolute Monocytes: 938 cells/uL (ref 200–950)
Basophils Absolute: 67 cells/uL (ref 0–200)
Basophils Relative: 1 %
Eosinophils Absolute: 228 cells/uL (ref 15–500)
Eosinophils Relative: 3.4 %
HCT: 40.8 % (ref 35.0–45.0)
Hemoglobin: 12.8 g/dL (ref 11.7–15.5)
Lymphs Abs: 1702 cells/uL (ref 850–3900)
MCH: 29.4 pg (ref 27.0–33.0)
MCHC: 31.4 g/dL — ABNORMAL LOW (ref 32.0–36.0)
MCV: 93.8 fL (ref 80.0–100.0)
MPV: 11.4 fL (ref 7.5–12.5)
Monocytes Relative: 14 %
Neutro Abs: 3765 cells/uL (ref 1500–7800)
Neutrophils Relative %: 56.2 %
Platelets: 441 10*3/uL — ABNORMAL HIGH (ref 140–400)
RBC: 4.35 10*6/uL (ref 3.80–5.10)
RDW: 13.3 % (ref 11.0–15.0)
Total Lymphocyte: 25.4 %
WBC: 6.7 10*3/uL (ref 3.8–10.8)

## 2020-04-16 LAB — T4, FREE: Free T4: 1.1 ng/dL (ref 0.8–1.8)

## 2020-04-16 MED ORDER — DUPILUMAB 300 MG/2ML ~~LOC~~ SOAJ
300.0000 mg | Freq: Once | SUBCUTANEOUS | Status: AC
  Administered 2020-04-16: 300 mg via SUBCUTANEOUS

## 2020-04-16 NOTE — Progress Notes (Signed)
Patient here today for her 2 week Dupixent injection.   Dupixent injected SQ into patients left lower abdomen. Patient tolerated well.  LOT: [id number] EXP: 07/14/21

## 2020-04-19 ENCOUNTER — Encounter: Payer: Self-pay | Admitting: Family Medicine

## 2020-04-19 ENCOUNTER — Other Ambulatory Visit: Payer: Self-pay

## 2020-04-19 ENCOUNTER — Ambulatory Visit (INDEPENDENT_AMBULATORY_CARE_PROVIDER_SITE_OTHER): Payer: BC Managed Care – PPO | Admitting: Family Medicine

## 2020-04-19 VITALS — BP 153/93 | HR 99 | Temp 97.5°F | Resp 16 | Ht 62.0 in | Wt 169.0 lb

## 2020-04-19 DIAGNOSIS — E039 Hypothyroidism, unspecified: Secondary | ICD-10-CM | POA: Diagnosis not present

## 2020-04-19 DIAGNOSIS — G720 Drug-induced myopathy: Secondary | ICD-10-CM | POA: Diagnosis not present

## 2020-04-19 DIAGNOSIS — D473 Essential (hemorrhagic) thrombocythemia: Secondary | ICD-10-CM

## 2020-04-19 DIAGNOSIS — Z9989 Dependence on other enabling machines and devices: Secondary | ICD-10-CM

## 2020-04-19 DIAGNOSIS — E669 Obesity, unspecified: Secondary | ICD-10-CM

## 2020-04-19 DIAGNOSIS — G4733 Obstructive sleep apnea (adult) (pediatric): Secondary | ICD-10-CM

## 2020-04-19 DIAGNOSIS — D75839 Thrombocytosis, unspecified: Secondary | ICD-10-CM

## 2020-04-19 DIAGNOSIS — Z0000 Encounter for general adult medical examination without abnormal findings: Secondary | ICD-10-CM | POA: Diagnosis not present

## 2020-04-19 DIAGNOSIS — E782 Mixed hyperlipidemia: Secondary | ICD-10-CM

## 2020-04-19 NOTE — Patient Instructions (Addendum)
Thank you for coming to the office today.  Return for knees in few weeks, can consider X-ray vs Injection vs Physical Therapy (no xray on Friday)  Send me copy of your COVID card - [employer] and dates.  If you get a flu shot at work, send me the date.  Red Rice Yeast is an option, herbal - start very low dose, because it can cause statin side effects  Fish Oil!  For future reference - Weight loss medications, if we proceed with medical management - Contrave pill ($99, for 6 weeks, mail order) - Wellbutrin/Naltrexone medicine for appetite suppression and weight loss. - Saxenda (daily injection) and Mancel Parsons is (weekly injection) - diabetes meds, with very effective weight loss, and appetite control   Please schedule a Follow-up Appointment to: Return in about 2 weeks (around 05/03/2020) for 2 weeks knee pain.  If you have any other questions or concerns, please feel free to call the office or send a message through [employer]. You may also schedule an earlier appointment if necessary.  Additionally, you may be receiving a survey about your experience at our office within a few days to 1 week by e-mail or mail. We value your feedback.  Nobie Putnam, DO [place]

## 2020-04-19 NOTE — Progress Notes (Addendum)
Subjective:    Patient ID: Sherry Oliver, female    DOB: Feb 04, 1961, 59 y.o.   MRN: [id number]  Sherry Oliver is a 59 y.o. female presenting on 04/19/2020 for Annual Exam   HPI   Here for Annual Physical and Lab Review.  CHRONIC HTN: Reports occasional home BP checks. Has intermittent elevation, admits stressors today. Current Meds - none currently Lifestyle: - Diet: admits some sodium / caffeine in diet, not always hydrating - Exercise: not as active due to knee pain Denies CP, dyspnea, HA, edema, dizziness / lightheadedness  HYPERLIPIDEMIA: - Reports history issues of high cholesterol and failed statins due to myalgia. Admits weight gain Lab in 04/2020 elevated LDL 168, TG >343 - Niacin - Not on Fish Oil - Rosuvastatin (crestor), Pitavstatin, Atorvastatin (lipitor), Simvastatin (zocor) Asking about herbal options  Thrombocytosis Prior history back to 2009 reviewing past records with CBG range of PLT 400-440 No new concerns Admits inflammation   Pre-Diabetes Reports no concerns recently past few months not always adhering to lifestyle A1c up to 5.9 on last lab. Meds: none Not on ACEi ARB - Diet admits poor diet lately - Exercise (Limited by by exercise due to knee pain) Denies hypoglycemia, polyuria, visual changes, numbness or tingling.  Hypothyroidism Chronic problem for >30 years. History of temperature instability and lab test showed problem. Last lab TSH normal 1.73 and Free T4 1.1 (04/2020) normal Currently taking Levothyroxine 74mg daily and Cytomel 564mdaily  GERD Taking Nexium 2084mTC BID  Former Smoker Quit in 2019 on chantix, smoked for 45 years. She did not have significant improvement after quitting smoking for 6 months. She had dyspnea and eventually saw Cardiologist had good  - Then followed up with Pul[facility] FuaOttie Glazierad PFTs that overall looked good. There was concern of possible underlying  asthma component. Now she is on Trelegy inhaler to see if improved. Also using incentive spirometer  OSA on CPAP Chronic problem. Has been doing well with CPAP using nightly. She does have complications with RLS and periodic leg movement overnight and some "twitching" with skin feeling like it was crawling. CPAP was adjusted and that has helped her sleep at night. She has tried Reqiup with intermittent results. She had L leg muscle twitching. Now doing better on Mirapex 1.5 quarter tab nightly.  Eczema / Nail Pitting Now followed [facility]ol[facility]onr TarBrendolyn PattyMigraine w/ Aura Visual issue with prism in vision and spreads out history    Health Maintenance:  Breast Cancer Screening: Left sided lymph node swollen under L arm axilla, then repeat mammogram had another lymph node. Her mother has BRCA 2 gene and had double mastectomy around age 50.70he proceeded with lymph node biopsy, ultimately it was benign thought to be reactive. Still has some discomfort L axilla but reduced swelling - Last done 11/30/19  Cervical Cancer Screening - Mother has BRCA 2 - hysterectomy found uterine cancer. Patient has had pap smears, last one 01/04/18 [facility], negative intraepithelial malignancy and negative High Risk HPV, good for up to 3 years.  Colonoscopy: done 03/24/18, no polyps, repeat within 5 years, report reviewed.  UTD Negative HIV 11/28/14  UTD COVID19 vaccine Pfizer in April 2021, awaiting card to update.  Due for Flu Vaccine when available, likely will get in October through work.  Future may warrant Hepatitis C screening blood test, can get next year.  Depression screen [facility] 04/19/2020 11/17/2019  Decreased  Interest 0 0  Down, Depressed, Hopeless 0 0  PHQ - 2 Score 0 0   No flowsheet data found.    Past Medical History:  Diagnosis Date  . Allergy   . Gastropathy   . GERD (gastroesophageal reflux disease)   . Headache   . Hyperlipidemia   . Hypertension    . Hypothyroid   . Lumbar radiculopathy   . Lung nodule   . Sleep apnea    Past Surgical History:  Procedure Laterality Date  . APPENDECTOMY  2008  . BLADDER SURGERY  1965   bladder stem  . BREAST BIOPSY Left 06/06/2019   Lymph node US Bx, hydromarker. pending path   . COLONOSCOPY WITH PROPOFOL N/A 02/19/2015   Procedure: COLONOSCOPY WITH PROPOFOL;  Surgeon: Lollie Sails, MD;  Location[facility] ENDOSCOPY;  Service: Endoscopy;  Laterality: N/A;  . COLONOSCOPY WITH PROPOFOL N/A 03/24/2018   Procedure: COLONOSCOPY WITH PROPOFOL;  Surgeon: Lollie Sails, MD;  Location: [facility] ENDOSCOPY;  Service: Endoscopy;  Laterality: N/A;  . ELBOW SURGERY Right 1982  . ESOPHAGOGASTRODUODENOSCOPY    . FOOT SURGERY Left 2007   neuroma removal, repeat 2008   Social History   Socioeconomic History  . Marital status: Single    Spouse name: Not on file  . Number of children: Not on file  . Years of education: Not on file  . Highest education level: Not on file  Occupational History  . Not on file  Tobacco Use  . Smoking status: Former Smoker    Types: Cigarettes    Quit date: 03/14/2018    Years since quitting: 2.1  . Smokeless tobacco: Former Network engineer  . Vaping Use: Never used  Substance and Sexual Activity  . Alcohol use: Yes  . Drug use: Not Currently  . Sexual activity: Not on file  Other Topics Concern  . Not on file  Social History Narrative  . Not on file   Social Determinants of Health   Financial Resource Strain:   . Difficulty of Paying Living Expenses:   Food Insecurity:   . Worried About Charity fundraiser in the Last Year:   . Arboriculturist in the Last Year:   Transportation Needs:   . Film/video editor (Medical):   Marland Kitchen Lack of Transportation (Non-Medical):   Physical Activity:   . Days of Exercise per Week:   . Minutes of Exercise per Session:   Stress:   . Feeling of Stress :   Social Connections:   . Frequency of Communication with Friends and  Family:   . Frequency of Social Gatherings with Friends and Family:   . Attends Religious Services:   . Active Member of Clubs or Organizations:   . Attends Archivist Meetings:   Marland Kitchen Marital Status:   Intimate Partner Violence:   . Fear of Current or Ex-Partner:   . Emotionally Abused:   Marland Kitchen Physically Abused:   . Sexually Abused:    Family History  Problem Relation Age of Onset  . Breast cancer Mother 34  . BRCA 1/2 Mother        positive for Brca 2  . Cancer Mother        bladder  . Breast cancer Maternal Aunt 19  . Breast cancer Maternal Grandfather        70's  . Alcohol abuse Father   . Lung cancer Father 60       mets to brain  .  Heart disease Maternal Grandmother   . Stroke Paternal Grandmother    Current Outpatient Medications on File Prior to Visit  Medication Sig  . acetaminophen (TYLENOL) 500 MG tablet Take by mouth.  . Ascorbic Acid (VITAMIN C) 1000 MG tablet Take 1,000 mg by mouth daily.  Marland Kitchen b complex vitamins tablet Take 1 tablet by mouth daily.  . Calcium Carbonate-Vitamin D 600-400 MG-UNIT per tablet Take 1 tablet by mouth daily.  . clobetasol ointment (TEMOVATE) 9.39 % Apply 1 application topically 2 (two) times daily as needed (rash).  Marland Kitchen desoximetasone (TOPICORT) 0.25 % cream Apply to affected areas twice daily. Avoid applying to face, groin, and axilla. Use as directed.  . diclofenac Sodium (VOLTAREN) 1 % GEL Apply 2 g topically 3 (three) times daily as needed.  . DUPIXENT 300 MG/2ML SOPN INJECT 600 MG (2 PENS) UNDER THE SKIN ON DAY 1, THEN 300 MG (1 PEN) EVERY 2 WEEKS STARTING ON DAY 15 AS DIRECTED  . esomeprazole (NEXIUM) 20 MG capsule Take 20 mg by mouth in the morning and at bedtime.  . fluticasone (FLONASE) 50 MCG/ACT nasal spray Place 2 sprays into both nostrils daily.   . Fluticasone-Umeclidin-Vilant (TRELEGY ELLIPTA) 100-62.5-25 MCG/INH AEPB Inhale into the lungs.  Marland Kitchen ibuprofen (ADVIL,MOTRIN) 200 MG tablet Take 400 mg by mouth 2 (two) times  daily.  Marland Kitchen levothyroxine (SYNTHROID, LEVOTHROID) 88 MCG tablet Take 88 mcg by mouth daily before breakfast.  . liothyronine (CYTOMEL) 5 MCG tablet Take 5 mcg by mouth daily.  . Multiple Vitamin (MULTIVITAMIN WITH MINERALS) TABS tablet Take 1 tablet by mouth daily.  Marland Kitchen omeprazole (PRILOSEC) 40 MG capsule Take by mouth.  . pramipexole (MIRAPEX) 1.5 MG tablet Take 0.375 mg by mouth at bedtime. Quarter tab nightly   . tacrolimus (PROTOPIC) 0.1 % ointment APPLY TO AFFECTED AREA TWICE A DAY AS DIRECTED   No current facility-administered medications on file prior to visit.    Review of Systems  Constitutional: Negative for activity change, appetite change, chills, diaphoresis, fatigue and fever.  HENT: Negative for congestion and hearing loss.   Eyes: Negative for visual disturbance.  Respiratory: Negative for apnea, cough, chest tightness, shortness of breath and wheezing.   Cardiovascular: Negative for chest pain, palpitations and leg swelling.  Gastrointestinal: Negative for abdominal pain, anal bleeding, blood in stool, constipation, diarrhea, nausea and vomiting.  Endocrine: Negative for cold intolerance.  Genitourinary: Negative for difficulty urinating, dysuria, frequency and hematuria.  Musculoskeletal: Negative for arthralgias and neck pain.  Skin: Negative for rash.  Allergic/Immunologic: Negative for environmental allergies.  Neurological: Negative for dizziness, weakness, light-headedness, numbness and headaches.  Hematological: Negative for adenopathy.  Psychiatric/Behavioral: Negative for behavioral problems, dysphoric mood and sleep disturbance. The patient is not nervous/anxious.    Per HPI unless specifically indicated above      Objective:    BP (!) 153/93   Pulse 99   Temp (!) 97.5 F (36.4 C) (Temporal)   Resp 16   Ht _0  (1.575 m)   Wt 169 lb (76.7 kg)   SpO2 96%   BMI 30.91 kg/m   Wt Readings from Last 3 Encounters:  04/19/20 169 lb (76.7 kg)  03/22/20 170  lb 9.6 oz (77.4 kg)  11/17/19 169 lb 12.8 oz (77 kg)    Physical Exam Vitals and nursing note reviewed.  Constitutional:      General: She is not in acute distress.    Appearance: She is well-developed. She is not diaphoretic.     Comments:  Well-appearing, comfortable, cooperative  HENT:     Head: Normocephalic and atraumatic.     Right Ear: Tympanic membrane, ear canal and external ear normal. There is no impacted cerumen.     Left Ear: Tympanic membrane, ear canal and external ear normal. There is no impacted cerumen.  Eyes:     General:        Right eye: No discharge.        Left eye: No discharge.     Conjunctiva/sclera: Conjunctivae normal.     Pupils: Pupils are equal, round, and reactive to light.  Neck:     Thyroid: No thyromegaly.     Vascular: No carotid bruit.  Cardiovascular:     Rate and Rhythm: Normal rate and regular rhythm.     Heart sounds: Normal heart sounds. No murmur heard.   Pulmonary:     Effort: Pulmonary effort is normal. No respiratory distress.     Breath sounds: Normal breath sounds. No wheezing or rales.  Abdominal:     General: Bowel sounds are normal. There is no distension.     Palpations: Abdomen is soft. There is no mass.     Tenderness: There is no abdominal tenderness.  Musculoskeletal:        General: No tenderness. Normal range of motion.     Cervical back: Normal range of motion and neck supple.     Right lower leg: No edema.     Left lower leg: No edema.     Comments: Upper / Lower Extremities: - Normal muscle tone, strength bilateral upper extremities 5/5, lower extremities 5/5  Lymphadenopathy:     Cervical: No cervical adenopathy.  Skin:    General: Skin is warm and dry.     Findings: No erythema or rash.  Neurological:     Mental Status: She is alert and oriented to person, place, and time.     Comments: Distal sensation intact to light touch all extremities  Psychiatric:        Behavior: Behavior normal.     Comments: Well  groomed, good eye contact, normal speech and thoughts        Results for orders placed or performed in visit on 04/15/20  T4, free  Result Value Ref Range   Free T4 1.1 0.8 - 1.8 ng/dL  TSH  Result Value Ref Range   TSH 1.73 0.40 - 4.50 mIU/L  Lipid panel  Result Value Ref Range   Cholesterol 269 (H) <200 mg/dL   HDL 43 (L) > OR = 50 mg/dL   Triglycerides 343 (H) <150 mg/dL   LDL Cholesterol (Calc) 168 (H) mg/dL (calc)   Total CHOL/HDL Ratio 6.3 (H) <5.0 (calc)   Non-HDL Cholesterol (Calc) 226 (H) <130 mg/dL (calc)  COMPLETE METABOLIC PANEL WITH GFR  Result Value Ref Range   Glucose, Bld 101 (H) 65 - 99 mg/dL   BUN 13 7 - 25 mg/dL   Creat 0.95 0.50 - 0.99 mg/dL   GFR, Est Non African American 65 > OR = 60 mL/min/1.69m   GFR, Est African American 75 > OR = 60 mL/min/1.758m  BUN/Creatinine Ratio NOT APPLICABLE 6 - 22 (calc)   Sodium 141 135 - 146 mmol/L   Potassium 4.2 3.5 - 5.3 mmol/L   Chloride 103 98 - 110 mmol/L   CO2 27 20 - 32 mmol/L   Calcium 10.1 8.6 - 10.4 mg/dL   Total Protein 7.7 6.1 - 8.1 g/dL   Albumin 4.3 3.6 -  5.1 g/dL   Globulin 3.4 1.9 - 3.7 g/dL (calc)   AG Ratio 1.3 1.0 - 2.5 (calc)   Total Bilirubin 0.5 0.2 - 1.2 mg/dL   Alkaline phosphatase (APISO) 75 37 - 153 U/L   AST 23 10 - 35 U/L   ALT 22 6 - 29 U/L  CBC with Differential/Platelet  Result Value Ref Range   WBC 6.7 3.8 - 10.8 Thousand/uL   RBC 4.35 3.80 - 5.10 Million/uL   Hemoglobin 12.8 11.7 - 15.5 g/dL   HCT 40.8 35 - 45 %   MCV 93.8 80.0 - 100.0 fL   MCH 29.4 27.0 - 33.0 pg   MCHC 31.4 (L) 32.0 - 36.0 g/dL   RDW 13.3 11.0 - 15.0 %   Platelets 441 (H) 140 - 400 Thousand/uL   MPV 11.4 7.5 - 12.5 fL   Neutro Abs 3,765 1,500 - 7,800 cells/uL   Lymphs Abs 1,702 850 - 3,900 cells/uL   Absolute Monocytes 938 200 - 950 cells/uL   Eosinophils Absolute 228 15 - 500 cells/uL   Basophils Absolute 67 0 - 200 cells/uL   Neutrophils Relative % 56.2 %   Total Lymphocyte 25.4 %   Monocytes  Relative 14.0 %   Eosinophils Relative 3.4 %   Basophils Relative 1.0 %  Hemoglobin A1c  Result Value Ref Range   Hgb A1c MFr Bld 5.9 (H) <5.7 % of total Hgb   Mean Plasma Glucose 123 (calc)   eAG (mmol/L) 6.8 (calc)      Assessment & Plan:   Problem List Items Addressed This Visit    Thrombocytosis (HCC)    Stable chronic problem range PLT 400-440 over past 10+ years      OSA on CPAP    Well controlled, chronic OSA on CPAP - Good adherence to CPAP nightly - Continue current CPAP therapy, patient seems to be benefiting from therapy       Obesity (BMI 30.0-34.9)    Abnormal weight Encourage lifestyle diet exercise regimen Goal to work on improving knee symptom outcomes first then can advance weight management plan      Mixed hyperlipidemia    Uncontrolled cholesterol off statin Improving lifestyle now Last lipid panel 04/2020  Plan: 1. Advised can add Fish Oil, also discussed benefit of Red Rice Yeast as option, caution myalgia - Discuss that if ineffective lifestyle intervention, we can advance therapy to Zetia, other med options, future PCSK9 meds or referral to lipid specialist 2. Encourage improved lifestyle - low carb/cholesterol, reduce portion size, continue improving regular exercise      Drug-induced myopathy    Secondary to statins Failed - Rosuvastatin (crestor), Pitavstatin, Atorvastatin (lipitor), Simvastatin (zocor) On supplements and diet managed now but elevated lipids, not at goal       Acquired hypothyroidism    Controlled on current regimen Last lab TSH T4 Normal Currently taking Levothyroxine 32mg daily and Cytomel 557mdaily       Other Visit Diagnoses    Annual physical exam    -  Primary      Updated Health Maintenance information - UTD on Colon, Breast, Cervical cancer screening. Reviewed recent lab results with patient A1c 5.9, with PreDM Encouraged improvement to lifestyle with diet and exercise - Goal of weight loss, additional  discussion today regarding future plans for follow-up after she works on improving lifestyle diet regimen, and eventually after able to improve exercise once knees improved. We can discuss future medication management and referral for weight control  such as Starleen Blue, Contrave etc.   No orders of the defined types were placed in this encounter.   Follow up plan: Return in about 2 weeks (around 05/03/2020) for 2 weeks knee pain.  Additionally we discussed other acute vs chronic conditions, she will return for knees and then discuss future visit weight management.  Nobie Putnam, DO [facility] 04/19/2020, 1:50 PM

## 2020-04-20 ENCOUNTER — Encounter: Payer: Self-pay | Admitting: Family Medicine

## 2020-04-20 DIAGNOSIS — R7309 Other abnormal glucose: Secondary | ICD-10-CM | POA: Insufficient documentation

## 2020-04-20 DIAGNOSIS — D75839 Thrombocytosis, unspecified: Secondary | ICD-10-CM | POA: Insufficient documentation

## 2020-04-20 NOTE — Assessment & Plan Note (Signed)
Abnormal weight Encourage lifestyle diet exercise regimen Goal to work on improving knee symptom outcomes first then can advance weight management plan

## 2020-04-20 NOTE — Assessment & Plan Note (Signed)
Uncontrolled cholesterol off statin Improving lifestyle now Last lipid panel 04/2020  Plan: 1. Advised can add Fish Oil, also discussed benefit of Red Rice Yeast as option, caution myalgia - Discuss that if ineffective lifestyle intervention, we can advance therapy to Zetia, other med options, future PCSK9 meds or referral to lipid specialist 2. Encourage improved lifestyle - low carb/cholesterol, reduce portion size, continue improving regular exercise

## 2020-04-20 NOTE — Assessment & Plan Note (Signed)
Controlled on current regimen Last lab TSH T4 Normal Currently taking Levothyroxine 88mcg daily and Cytomel 5mg daily 

## 2020-04-20 NOTE — Assessment & Plan Note (Signed)
Stable chronic problem range PLT 400-440 over past 10+ years

## 2020-04-20 NOTE — Assessment & Plan Note (Signed)
Well controlled, chronic OSA on CPAP - Good adherence to CPAP nightly - Continue current CPAP therapy, patient seems to be benefiting from therapy  

## 2020-04-20 NOTE — Assessment & Plan Note (Signed)
Secondary to statins Failed - Rosuvastatin (crestor), Pitavstatin, Atorvastatin (lipitor), Simvastatin (zocor) On supplements and diet managed now but elevated lipids, not at goal

## 2020-04-30 ENCOUNTER — Ambulatory Visit: Payer: BC Managed Care – PPO

## 2020-04-30 ENCOUNTER — Other Ambulatory Visit: Payer: Self-pay

## 2020-04-30 ENCOUNTER — Telehealth: Payer: Self-pay

## 2020-04-30 NOTE — Telephone Encounter (Signed)
Patient came in today for Dupixent injection. Patient did self injection to right upper thigh. Patient tolerated her injection well. She is going to start doing injections at home. Patient was given a demo pen to take home with instructions to practice.   I did not code visit today as I did not touch the patient.

## 2020-05-06 ENCOUNTER — Other Ambulatory Visit: Payer: Self-pay

## 2020-05-06 ENCOUNTER — Ambulatory Visit (INDEPENDENT_AMBULATORY_CARE_PROVIDER_SITE_OTHER): Payer: BC Managed Care – PPO | Admitting: Family Medicine

## 2020-05-06 ENCOUNTER — Encounter: Payer: Self-pay | Admitting: Family Medicine

## 2020-05-06 VITALS — BP 145/81 | HR 100 | Temp 97.1°F | Resp 16 | Ht 62.0 in | Wt 171.0 lb

## 2020-05-06 DIAGNOSIS — M25562 Pain in left knee: Secondary | ICD-10-CM

## 2020-05-06 DIAGNOSIS — M25462 Effusion, left knee: Secondary | ICD-10-CM

## 2020-05-06 DIAGNOSIS — M170 Bilateral primary osteoarthritis of knee: Secondary | ICD-10-CM | POA: Diagnosis not present

## 2020-05-06 NOTE — Patient Instructions (Addendum)
Thank you for coming to the office today.  [facility] [place], [place]  [address] Phone: [phone]  Stay tuned for apt with [place] for the knees.  Please schedule a Follow-up Appointment to: Return in about 4 weeks (around 06/03/2020), or if symptoms worsen or fail to improve, for knee pain if not improved.  If you have any other questions or concerns, please feel free to call the office or send a message through [place]. You may also schedule an earlier appointment if necessary.  Additionally, you may be receiving a survey about your experience at our office within a few days to 1 week by e-mail or mail. We value your feedback.  Nobie Putnam, DO [place]

## 2020-05-06 NOTE — Progress Notes (Signed)
Subjective:    Patient ID: Sherry Oliver, female    DOB: 12-24-1960, 59 y.o.   MRN: [id number]  Sherry Oliver is a 59 y.o. female presenting on 05/06/2020 for Knee Pain   HPI   Chronic Bilateral Knee Pain - Last visit with me 03/22/20, for follow-up same problem knee pain, treated with recently treated back in 01/2020 with 3 week taper on prednisone with some relief, our evaluation here on 7/9 thought to be knee strain, see prior notes for background information. - Interval update with lack of improvement, on topical NSAID, tylenol, compression - Today patient reports now persistent knee pain constant, all days, worse with going down stairs and rotation with knee, Left and Right, now today Right is worse. Describes "popping" with movements, has some possible knee instability, some "hesitant" - tried OTC soft knee brace and immobilizer - In past she had done fine with knees and never had problem before - She uses ice packs and Tylenol x 3 and tried OTC knee brace - No prior imaging x-rays, procedure, injury to knees before. Denies any new injury swelling redness or new fall other joint pain, fever chills  Depression screen [facility] 2/9 04/19/2020 11/17/2019  Decreased Interest 0 0  Down, Depressed, Hopeless 0 0  PHQ - 2 Score 0 0    Social History   Tobacco Use  . Smoking status: Former Smoker    Types: Cigarettes    Quit date: 03/14/2018    Years since quitting: 2.1  . Smokeless tobacco: Former Network engineer  . Vaping Use: Never used  Substance Use Topics  . Alcohol use: Yes  . Drug use: Not Currently    Review of Systems Per HPI unless specifically indicated above     Objective:    BP (!) 145/81   Pulse 100   Temp (!) 97.1 F (36.2 C) (Temporal)   Resp 16   Ht 5\' 2"  (1.575 m)   Wt 171 lb (77.6 kg)   SpO2 96%   BMI 31.28 kg/m   Wt Readings from Last 3 Encounters:  05/06/20 171 lb (77.6 kg)  04/19/20 169 lb (76.7 kg)  03/22/20 170 lb 9.6 oz (77.4 kg)     Physical Exam Vitals and nursing note reviewed.  Constitutional:      General: She is not in acute distress.    Appearance: She is well-developed. She is not diaphoretic.     Comments: Well-appearing, comfortable, cooperative  HENT:     Head: Normocephalic and atraumatic.  Eyes:     General:        Right eye: No discharge.        Left eye: No discharge.     Conjunctiva/sclera: Conjunctivae normal.  Cardiovascular:     Rate and Rhythm: Normal rate.  Pulmonary:     Effort: Pulmonary effort is normal.  Musculoskeletal:     Comments: Bilateral Knees Inspection: Normal appearance and symmetrical. No ecchymosis or effusion. Palpation: Non-tender. Mild +TTP bilateral knee medial  joint line. Fine crepitus ROM: Full active ROM bilaterally Special Testing: Lachman / Valgus/Varus tests negative with intact ligaments (ACL, MCL, LCL). McMurray mild pain without pop medial, standing thessaly mild pain without instability bilaterally Strength: 5/5 intact knee flex/ext, ankle dorsi/plantarflex Neurovascular: distally intact sensation light touch and pulses  Skin:    General: Skin is warm and dry.     Findings: No erythema or rash.  Neurological:     Mental Status: She is alert and  oriented to person, place, and time.  Psychiatric:        Behavior: Behavior normal.     Comments: Well groomed, good eye contact, normal speech and thoughts       Results for orders placed or performed in visit on 04/15/20  T4, free  Result Value Ref Range   Free T4 1.1 0.8 - 1.8 ng/dL  TSH  Result Value Ref Range   TSH 1.73 0.40 - 4.50 mIU/L  Lipid panel  Result Value Ref Range   Cholesterol 269 (H) <200 mg/dL   HDL 43 (L) > OR = 50 mg/dL   Triglycerides 343 (H) <150 mg/dL   LDL Cholesterol (Calc) 168 (H) mg/dL (calc)   Total CHOL/HDL Ratio 6.3 (H) <5.0 (calc)   Non-HDL Cholesterol (Calc) 226 (H) <130 mg/dL (calc)  COMPLETE METABOLIC PANEL WITH GFR  Result Value Ref Range   Glucose, Bld 101 (H) 65  - 99 mg/dL   BUN 13 7 - 25 mg/dL   Creat 0.95 0.50 - 0.99 mg/dL   GFR, Est Non African American 65 > OR = 60 mL/min/1.86m2   GFR, Est African American 75 > OR = 60 mL/min/1.38m2   BUN/Creatinine Ratio NOT APPLICABLE 6 - 22 (calc)   Sodium 141 135 - 146 mmol/L   Potassium 4.2 3.5 - 5.3 mmol/L   Chloride 103 98 - 110 mmol/L   CO2 27 20 - 32 mmol/L   Calcium 10.1 8.6 - 10.4 mg/dL   Total Protein 7.7 6.1 - 8.1 g/dL   Albumin 4.3 3.6 - 5.1 g/dL   Globulin 3.4 1.9 - 3.7 g/dL (calc)   AG Ratio 1.3 1.0 - 2.5 (calc)   Total Bilirubin 0.5 0.2 - 1.2 mg/dL   Alkaline phosphatase (APISO) 75 37 - 153 U/L   AST 23 10 - 35 U/L   ALT 22 6 - 29 U/L  CBC with Differential/Platelet  Result Value Ref Range   WBC 6.7 3.8 - 10.8 Thousand/uL   RBC 4.35 3.80 - 5.10 Million/uL   Hemoglobin 12.8 11.7 - 15.5 g/dL   HCT 40.8 35 - 45 %   MCV 93.8 80.0 - 100.0 fL   MCH 29.4 27.0 - 33.0 pg   MCHC 31.4 (L) 32.0 - 36.0 g/dL   RDW 13.3 11.0 - 15.0 %   Platelets 441 (H) 140 - 400 Thousand/uL   MPV 11.4 7.5 - 12.5 fL   Neutro Abs 3,765 1,500 - 7,800 cells/uL   Lymphs Abs 1,702 850 - 3,900 cells/uL   Absolute Monocytes 938 200 - 950 cells/uL   Eosinophils Absolute 228 15 - 500 cells/uL   Basophils Absolute 67 0 - 200 cells/uL   Neutrophils Relative % 56.2 %   Total Lymphocyte 25.4 %   Monocytes Relative 14.0 %   Eosinophils Relative 3.4 %   Basophils Relative 1.0 %  Hemoglobin A1c  Result Value Ref Range   Hgb A1c MFr Bld 5.9 (H) <5.7 % of total Hgb   Mean Plasma Glucose 123 (calc)   eAG (mmol/L) 6.8 (calc)      Assessment & Plan:   Problem List Items Addressed This Visit    None    Visit Diagnoses    Pain and swelling of left knee    -  Primary   Relevant Orders   Ambulatory referral to Orthopedic Surgery   Primary osteoarthritis of both knees       Relevant Orders   Ambulatory referral to Orthopedic Surgery  Chronic now bilateral knee pain, L>R seems episodic without swelling Has  instability symptoms, no acute injury Concern for meniscus on exam and history Some repetitive strain - Able to bear weight - No prior history of knee surgery, arthroscopy - Inadequate conservative therapy   Plan: 1. Referral to [facility] orthopedics for consultation on chronic >3 month knee pain now worsening without improvement over past 3 months, concern bilateral now possible meniscus may be degenerative issue, main concern is for instability and would warrant further imaging with X-ray and likely advanced imaging, defer X-ray today since no X-ray tech in office 2. Keep on conservative care 3 RICE therapy (rest, ice, compression Knee sleeve, elevation) for swelling, activity modification   Orders Placed This Encounter  Procedures  . Ambulatory referral to Orthopedic Surgery    Referral Priority:   Routine    Referral Type:   Surgical    Referral Reason:   Specialty Services Required    Requested Specialty:   Orthopedic Surgery    Number of Visits Requested:   1      No orders of the defined types were placed in this encounter.     Follow up plan: Return in about 4 weeks (around 06/03/2020), or if symptoms worsen or fail to improve, for knee pain if not improved.   Nobie Putnam, [facility] 05/06/2020, 1:58 PM

## 2020-05-14 DIAGNOSIS — M25561 Pain in right knee: Secondary | ICD-10-CM | POA: Diagnosis not present

## 2020-05-14 DIAGNOSIS — M25562 Pain in left knee: Secondary | ICD-10-CM | POA: Diagnosis not present

## 2020-05-14 DIAGNOSIS — M222X1 Patellofemoral disorders, right knee: Secondary | ICD-10-CM | POA: Diagnosis not present

## 2020-05-14 DIAGNOSIS — M222X2 Patellofemoral disorders, left knee: Secondary | ICD-10-CM | POA: Diagnosis not present

## 2020-06-03 DIAGNOSIS — T444X5A Adverse effect of predominantly alpha-adrenoreceptor agonists, initial encounter: Secondary | ICD-10-CM | POA: Diagnosis not present

## 2020-06-03 DIAGNOSIS — J342 Deviated nasal septum: Secondary | ICD-10-CM | POA: Diagnosis not present

## 2020-06-03 DIAGNOSIS — J3489 Other specified disorders of nose and nasal sinuses: Secondary | ICD-10-CM | POA: Diagnosis not present

## 2020-06-17 DIAGNOSIS — J3489 Other specified disorders of nose and nasal sinuses: Secondary | ICD-10-CM | POA: Diagnosis not present

## 2020-06-17 DIAGNOSIS — J301 Allergic rhinitis due to pollen: Secondary | ICD-10-CM | POA: Diagnosis not present

## 2020-06-17 DIAGNOSIS — J342 Deviated nasal septum: Secondary | ICD-10-CM | POA: Diagnosis not present

## 2020-06-18 ENCOUNTER — Other Ambulatory Visit: Payer: Self-pay

## 2020-06-18 ENCOUNTER — Ambulatory Visit: Payer: BC Managed Care – PPO | Admitting: Dermatology

## 2020-06-18 DIAGNOSIS — Z79899 Other long term (current) drug therapy: Secondary | ICD-10-CM | POA: Diagnosis not present

## 2020-06-18 DIAGNOSIS — L209 Atopic dermatitis, unspecified: Secondary | ICD-10-CM | POA: Diagnosis not present

## 2020-06-18 NOTE — Patient Instructions (Signed)
Dupilumab (Dupixent) is a treatment given by injection for adults with moderate-to-severe atopic dermatitis. Goal is control of skin condition, not cure. It is given as 2 injections at the first dose followed by 1 injection ever 2 weeks thereafter.  Potential side effects include allergic reaction, herpes infections, injection site reactions and conjunctivitis (inflammation of the eyes).  The use of Dupixent requires long term medication management, including periodic office visits.  

## 2020-06-18 NOTE — Progress Notes (Signed)
° °  Follow-Up Visit   Subjective  Sherry Oliver is a 59 y.o. female who presents for the following: Atopic Dermatitis (hands, arms, feet. Much improved. She still has itching on her feet, but much better than it was. Patient is doing well on Dupixent injections. Patient is self-injecting at home. No problems. She is also using Topicort 0.25% Cream and Protopic 0.1% Ointment.). No side effects.   The following portions of the chart were reviewed this encounter and updated as appropriate:      Review of Systems:  No other skin or systemic complaints except as noted in HPI or Assessment and Plan.  Objective  Well appearing patient in no apparent distress; mood and affect are within normal limits.  A focused examination was performed including hands, feet. Relevant physical exam findings are noted in the Assessment and Plan.  Objective  Palms, soles, arms: Erythema of palms with hyperlinear skin markings, no scaling; diffuse erythema on soles with focal areas of hyperkeratosis on heels,ball of foot on Left (at pressure point) with hyperkeratosis and fissure.  Fissure below 5th toe.   Assessment & Plan  Atopic dermatitis, unspecified type Palms, soles, arms  Improving. Tolerating well  Continue Dupixent injections 300mg /47mL q 2 weeks. Continue Topicort 0.25% Cream qd/bid prn. Continue Protopic 0.1% Ointment qd/bid prn.  Continue Vanicream Moisturizer, Vaseline, CeraVe SA. Samples of AmLactin Rapid Relief lotion, CeraVe Psoriasis to apply to feet- reviewed allergies and pt ok to use.  They are on approved list   Discussed Xtrac laser for resistant areas on feet. Will submit for insurance approval. If approved, may start.  Dupilumab (Dupixent) is a treatment given by injection for adults with moderate-to-severe atopic dermatitis. Goal is control of skin condition, not cure. It is given as 2 injections at the first dose followed by 1 injection ever 2 weeks thereafter.  Potential  side effects include allergic reaction, herpes infections, injection site reactions and conjunctivitis (inflammation of the eyes).  The use of Dupixent requires long term medication management, including periodic office visits.  Topical steroids (such as triamcinolone, fluocinolone, fluocinonide, mometasone, clobetasol, halobetasol, betamethasone, hydrocortisone) can cause thinning and lightening of the skin if they are used for too long in the same area. Your physician has selected the right strength medicine for your problem and area affected on the body. Please use your medication only as directed by your physician to prevent side effects.    Return in about 3 months (around 09/18/2020) for Atopic Derm - Dupixent.   Documentation: I have reviewed the above documentation for accuracy and completeness, and I agree with the above.  Brendolyn Patty MD

## 2020-06-27 DIAGNOSIS — J449 Chronic obstructive pulmonary disease, unspecified: Secondary | ICD-10-CM | POA: Diagnosis not present

## 2020-07-03 DIAGNOSIS — J328 Other chronic sinusitis: Secondary | ICD-10-CM | POA: Diagnosis not present

## 2020-07-10 ENCOUNTER — Other Ambulatory Visit: Payer: Self-pay | Admitting: Dermatology

## 2020-07-15 DIAGNOSIS — J301 Allergic rhinitis due to pollen: Secondary | ICD-10-CM | POA: Diagnosis not present

## 2020-07-15 DIAGNOSIS — J342 Deviated nasal septum: Secondary | ICD-10-CM | POA: Diagnosis not present

## 2020-07-15 DIAGNOSIS — G4733 Obstructive sleep apnea (adult) (pediatric): Secondary | ICD-10-CM | POA: Diagnosis not present

## 2020-07-15 DIAGNOSIS — J3489 Other specified disorders of nose and nasal sinuses: Secondary | ICD-10-CM | POA: Diagnosis not present

## 2020-07-16 ENCOUNTER — Other Ambulatory Visit: Payer: Self-pay

## 2020-07-16 ENCOUNTER — Ambulatory Visit (INDEPENDENT_AMBULATORY_CARE_PROVIDER_SITE_OTHER): Payer: BC Managed Care – PPO

## 2020-07-16 ENCOUNTER — Other Ambulatory Visit: Payer: Self-pay | Admitting: Family Medicine

## 2020-07-16 DIAGNOSIS — Z23 Encounter for immunization: Secondary | ICD-10-CM | POA: Diagnosis not present

## 2020-07-16 DIAGNOSIS — E039 Hypothyroidism, unspecified: Secondary | ICD-10-CM

## 2020-07-16 MED ORDER — LIOTHYRONINE SODIUM 5 MCG PO TABS: 5.0000 ug | ORAL_TABLET | Freq: Every day | ORAL | 1 refills | Status: DC

## 2020-07-16 MED ORDER — LEVOTHYROXINE SODIUM 88 MCG PO TABS: 88.0000 ug | ORAL_TABLET | Freq: Every day | ORAL | 1 refills | Status: DC

## 2020-08-15 DIAGNOSIS — J301 Allergic rhinitis due to pollen: Secondary | ICD-10-CM | POA: Diagnosis not present

## 2020-09-17 ENCOUNTER — Ambulatory Visit: Payer: BC Managed Care – PPO | Admitting: Dermatology

## 2020-10-21 ENCOUNTER — Other Ambulatory Visit: Payer: Self-pay

## 2020-10-21 ENCOUNTER — Ambulatory Visit: Payer: BC Managed Care – PPO | Admitting: Dermatology

## 2020-10-21 DIAGNOSIS — L2089 Other atopic dermatitis: Secondary | ICD-10-CM

## 2020-10-21 DIAGNOSIS — B070 Plantar wart: Secondary | ICD-10-CM

## 2020-10-21 DIAGNOSIS — L84 Corns and callosities: Secondary | ICD-10-CM | POA: Diagnosis not present

## 2020-10-21 NOTE — Progress Notes (Signed)
   Follow-Up Visit   Subjective  Sherry Oliver is a 60 y.o. female who presents for the following: Follow-up (Atopic dermatitis of the palms, soles, and arms. She is mostly controlled with Dupixent injections, although she breaks out on her arm a few days before her next injection. No issues with injections. She has noticed her eyes slightly worsening, but she thinks it could be just due to age. She has not seen her eye doctor recently. She uses Protopic 0.1% ointment and Topicort 0.25% Cream prn. She moisturizes with Cetaphil Intensive Moisturizing Cream. ).    The following portions of the chart were reviewed this encounter and updated as appropriate:       Review of Systems:  No other skin or systemic complaints except as noted in HPI or Assessment and Plan.  Objective  Well appearing patient in no apparent distress; mood and affect are within normal limits.  A focused examination was performed including face, hands, feet, arms. Relevant physical exam findings are noted in the Assessment and Plan.  Objective  palms, soles, arms: Erythema of the palms and fingers with mild scale; pink scaly patch on the right forearm; nail dystrophy with transverse ridges and absent cuticles.  Objective  Left Plantar Foot: Firm depressed keratotic papules -- Discussed viral etiology and contagion.   Objective  left medial foot at ball: Thickened hyperkeratosis at pressure point   Assessment & Plan  Other atopic dermatitis palms, soles, arms  Improving on Dupixent  Continue Dupixent injections sq q 2 weeks. Continue Protopic 0.1% ointment qd/bid prn. Continue Topicort 0.25% cream qd/bid prn. Avoid face, groin, axilla.  Recommend patient see eye doctor regularly.  Atopic dermatitis - Severe, on Dupixent (biologic medication).  Atopic dermatitis (eczema) is a chronic, relapsing, pruritic condition that can significantly affect quality of life. It is often associated with allergic  rhinitis and/or asthma and can require treatment with topical medications, phototherapy, or in severe cases a biologic medication called Dupixent.    Dupilumab (Dupixent) is a treatment given by injection for adults with moderate-to-severe atopic dermatitis. Goal is control of skin condition, not cure. It is given as 2 injections at the first dose followed by 1 injection ever 2 weeks thereafter.  Potential side effects include allergic reaction, herpes infections, injection site reactions and conjunctivitis (inflammation of the eyes).  The use of Dupixent requires long term medication management, including periodic office visits.  Topical steroids (such as triamcinolone, fluocinolone, fluocinonide, mometasone, clobetasol, halobetasol, betamethasone, hydrocortisone) can cause thinning and lightening of the skin if they are used for too long in the same area. Your physician has selected the right strength medicine for your problem and area affected on the body. Please use your medication only as directed by your physician to prevent side effects.     Plantar wart Left Plantar Foot  Symptomatic Start Curad Mediplast Patches to AA warts, replace as needed until improved.  Discussed cryotherapy, pt defers at this time  Pre-ulcerative corn or callous left medial foot at ball  With possible underlying unresolved Atopic Dermatitis  Start Mediplast patches to AA, replace as needed until improved.  Return in about 6 months (around 04/20/2021) for AD.   IJamesetta Orleans, CMA, am acting as scribe for Brendolyn Patty, MD .  Documentation: I have reviewed the above documentation for accuracy and completeness, and I agree with the above.  Brendolyn Patty MD

## 2020-10-21 NOTE — Patient Instructions (Addendum)
Dupilumab (Dupixent) is a treatment given by injection for adults with moderate-to-severe atopic dermatitis. Goal is control of skin condition, not cure. It is given as 2 injections at the first dose followed by 1 injection ever 2 weeks thereafter.  Potential side effects include allergic reaction, herpes infections, injection site reactions and conjunctivitis (inflammation of the eyes).  The use of Dupixent requires long term medication management, including periodic office visits.  Topical steroids (such as triamcinolone, fluocinolone, fluocinonide, mometasone, clobetasol, halobetasol, betamethasone, hydrocortisone) can cause thinning and lightening of the skin if they are used for too long in the same area. Your physician has selected the right strength medicine for your problem and area affected on the body. Please use your medication only as directed by your physician to prevent side effects.    Curad Mediplast Patches - Apply to affected areas warts on foot, replace pads as needed until improved. Active Ingredients: Salicylic Acid (90%) Inactive Ingredients: Flannel, Lanolin, Natural Rubber, [place], Rosin, Talcum, Terpenephenol Resin

## 2020-12-01 ENCOUNTER — Emergency Department: Payer: BC Managed Care – PPO

## 2020-12-01 ENCOUNTER — Other Ambulatory Visit: Payer: Self-pay

## 2020-12-01 ENCOUNTER — Ambulatory Visit
Admission: EM | Admit: 2020-12-01 | Discharge: 2020-12-01 | Disposition: A | Discharge status: Other Acute Inpatient Hospital | Payer: BC Managed Care – PPO

## 2020-12-01 ENCOUNTER — Encounter: Payer: Self-pay | Admitting: Emergency Medicine

## 2020-12-01 ENCOUNTER — Observation Stay
Admission: EM | Admit: 2020-12-01 | Discharge: 2020-12-02 | Disposition: A | Discharge status: Home / Self Care | Payer: BC Managed Care – PPO | Attending: Emergency Medicine | Admitting: Emergency Medicine

## 2020-12-01 DIAGNOSIS — R531 Weakness: Secondary | ICD-10-CM

## 2020-12-01 DIAGNOSIS — E039 Hypothyroidism, unspecified: Secondary | ICD-10-CM | POA: Diagnosis not present

## 2020-12-01 DIAGNOSIS — R278 Other lack of coordination: Secondary | ICD-10-CM | POA: Diagnosis not present

## 2020-12-01 DIAGNOSIS — Z9989 Dependence on other enabling machines and devices: Secondary | ICD-10-CM

## 2020-12-01 DIAGNOSIS — G2581 Restless legs syndrome: Secondary | ICD-10-CM | POA: Diagnosis present

## 2020-12-01 DIAGNOSIS — I6381 Other cerebral infarction due to occlusion or stenosis of small artery: Secondary | ICD-10-CM | POA: Diagnosis not present

## 2020-12-01 DIAGNOSIS — I639 Cerebral infarction, unspecified: Secondary | ICD-10-CM | POA: Diagnosis not present

## 2020-12-01 DIAGNOSIS — Z87891 Personal history of nicotine dependence: Secondary | ICD-10-CM | POA: Diagnosis not present

## 2020-12-01 DIAGNOSIS — Z79899 Other long term (current) drug therapy: Secondary | ICD-10-CM | POA: Insufficient documentation

## 2020-12-01 DIAGNOSIS — I10 Essential (primary) hypertension: Secondary | ICD-10-CM | POA: Insufficient documentation

## 2020-12-01 DIAGNOSIS — Z91040 Latex allergy status: Secondary | ICD-10-CM | POA: Diagnosis not present

## 2020-12-01 DIAGNOSIS — Z20822 Contact with and (suspected) exposure to covid-19: Secondary | ICD-10-CM | POA: Insufficient documentation

## 2020-12-01 DIAGNOSIS — R29818 Other symptoms and signs involving the nervous system: Secondary | ICD-10-CM | POA: Diagnosis not present

## 2020-12-01 DIAGNOSIS — G4733 Obstructive sleep apnea (adult) (pediatric): Secondary | ICD-10-CM

## 2020-12-01 LAB — CBC
HCT: 42.4 % (ref 36.0–46.0)
Hemoglobin: 13.7 g/dL (ref 12.0–15.0)
MCH: 30.2 pg (ref 26.0–34.0)
MCHC: 32.3 g/dL (ref 30.0–36.0)
MCV: 93.6 fL (ref 80.0–100.0)
Platelets: 298 10*3/uL (ref 150–400)
RBC: 4.53 MIL/uL (ref 3.87–5.11)
RDW: 13.6 % (ref 11.5–15.5)
WBC: 8 10*3/uL (ref 4.0–10.5)
nRBC: 0 % (ref 0.0–0.2)

## 2020-12-01 LAB — URINALYSIS, COMPLETE (UACMP) WITH MICROSCOPIC
Bacteria, UA: NONE SEEN
Bilirubin Urine: NEGATIVE
Glucose, UA: NEGATIVE mg/dL
Hgb urine dipstick: NEGATIVE
Ketones, ur: NEGATIVE mg/dL
Leukocytes,Ua: NEGATIVE
Nitrite: NEGATIVE
Protein, ur: NEGATIVE mg/dL
Specific Gravity, Urine: 1.011 (ref 1.005–1.030)
pH: 5 (ref 5.0–8.0)

## 2020-12-01 LAB — DIFFERENTIAL
Abs Immature Granulocytes: 0.03 10*3/uL (ref 0.00–0.07)
Basophils Absolute: 0.1 10*3/uL (ref 0.0–0.1)
Basophils Relative: 1 %
Eosinophils Absolute: 0.2 10*3/uL (ref 0.0–0.5)
Eosinophils Relative: 3 %
Immature Granulocytes: 0 %
Lymphocytes Relative: 25 %
Lymphs Abs: 2 10*3/uL (ref 0.7–4.0)
Monocytes Absolute: 0.8 10*3/uL (ref 0.1–1.0)
Monocytes Relative: 10 %
Neutro Abs: 4.9 10*3/uL (ref 1.7–7.7)
Neutrophils Relative %: 61 %

## 2020-12-01 LAB — COMPREHENSIVE METABOLIC PANEL
ALT: 33 U/L (ref 0–44)
AST: 32 U/L (ref 15–41)
Albumin: 4 g/dL (ref 3.5–5.0)
Alkaline Phosphatase: 63 U/L (ref 38–126)
Anion gap: 10 (ref 5–15)
BUN: 14 mg/dL (ref 8–23)
CO2: 24 mmol/L (ref 22–32)
Calcium: 9.5 mg/dL (ref 8.9–10.3)
Chloride: 106 mmol/L (ref 98–111)
Creatinine, Ser: 0.92 mg/dL (ref 0.44–1.00)
GFR, Estimated: >60 mL/min (ref >60)
Glucose, Bld: 110 mg/dL — ABNORMAL HIGH (ref 70–99)
Potassium: 3.5 mmol/L (ref 3.5–5.1)
Sodium: 140 mmol/L (ref 135–145)
Total Bilirubin: 0.5 mg/dL (ref 0.3–1.2)
Total Protein: 8 g/dL (ref 6.5–8.1)

## 2020-12-01 LAB — PROTIME-INR
INR: 1 (ref 0.8–1.2)
Prothrombin Time: 12.3 seconds (ref 11.4–15.2)

## 2020-12-01 LAB — APTT: aPTT: <24 seconds — ABNORMAL LOW (ref 24–36)

## 2020-12-01 IMAGING — CT CT HEAD W/O CM
3 series · 16 of 46 positions shown, 19 images · non-contrast
Comparison: None.

CLINICAL DATA: Right facial numbness and blurred vision which is
resolved. Discoordination and gait abnormality.

EXAM:
CT HEAD WITHOUT CONTRAST
TECHNIQUE: Contiguous axial images were obtained from the base of the skull
through the vertex without intravenous contrast.

[Series 3: head wo · axial · 0.42mm/px · z∈[-126,-6]mm · 10 of 29 slices shown, 13 images]
[im 3/29  brain]
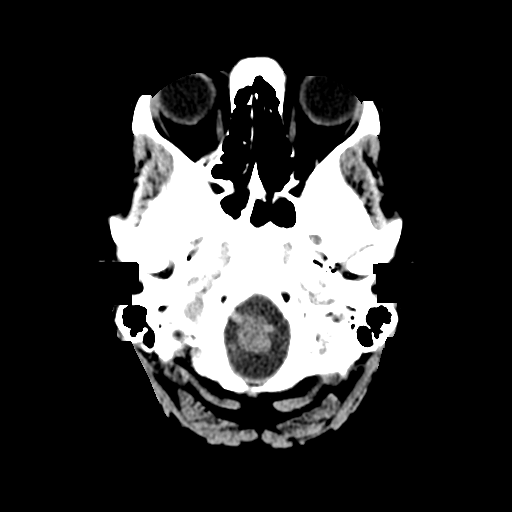
[im 3/29  bone]
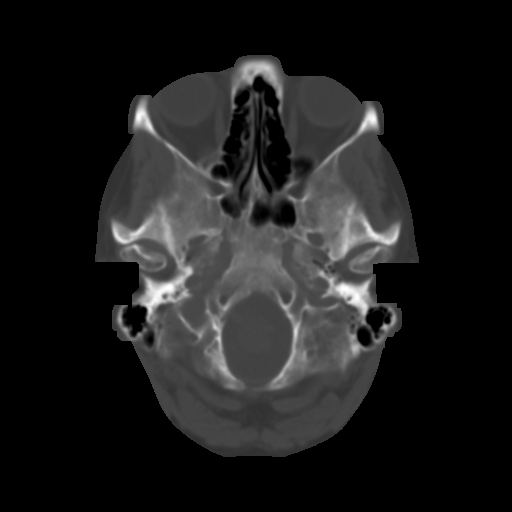
[im 6/29  brain]
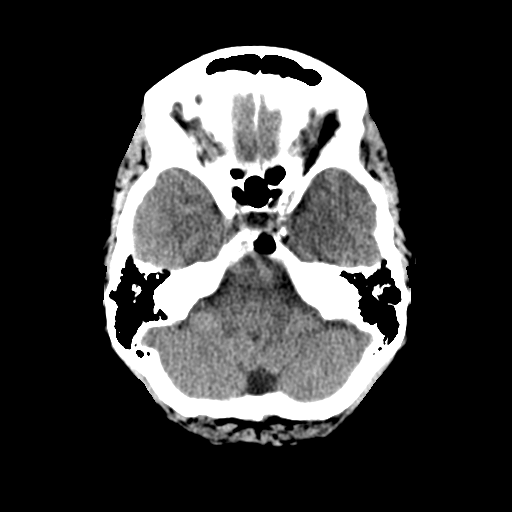
[im 8/29  brain]
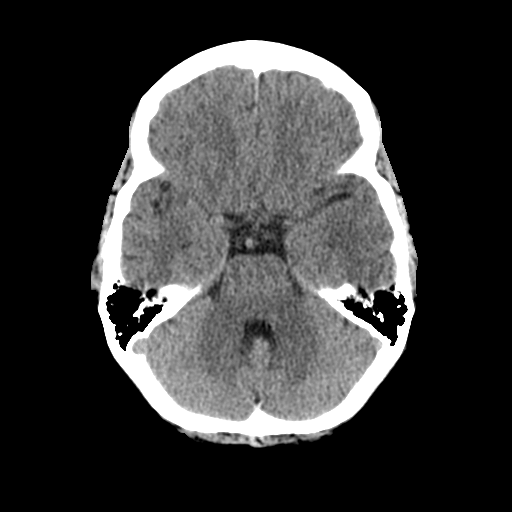
[im 11/29  brain]
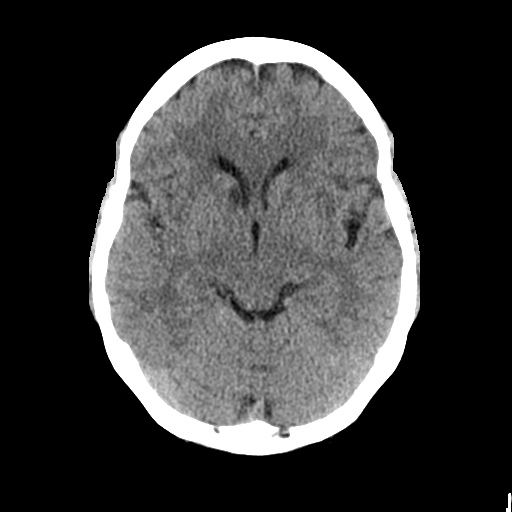
[im 14/29  brain]
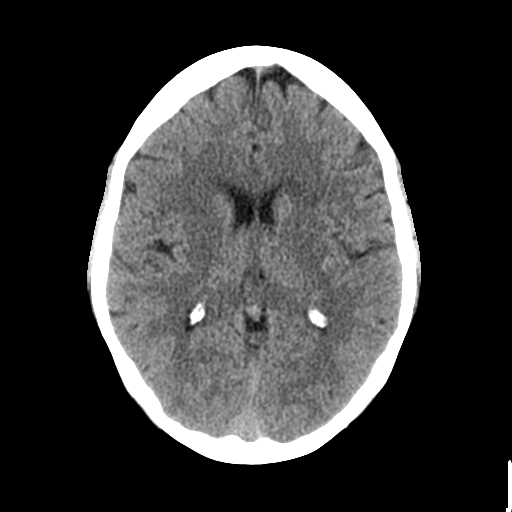
[im 14/29  bone]
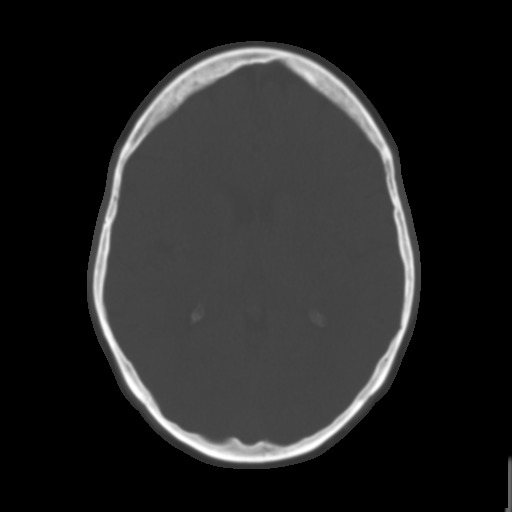
[im 16/29  brain]
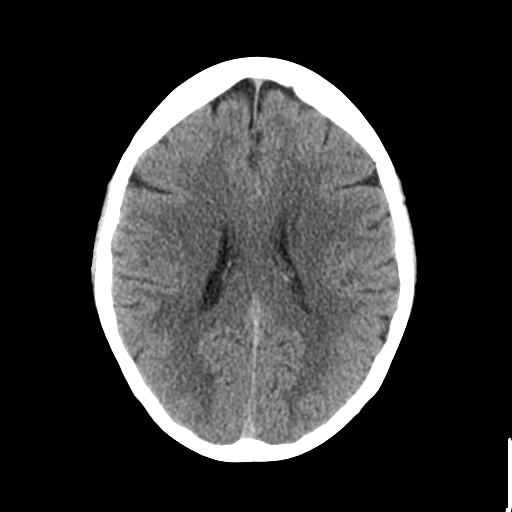
[im 19/29  brain]
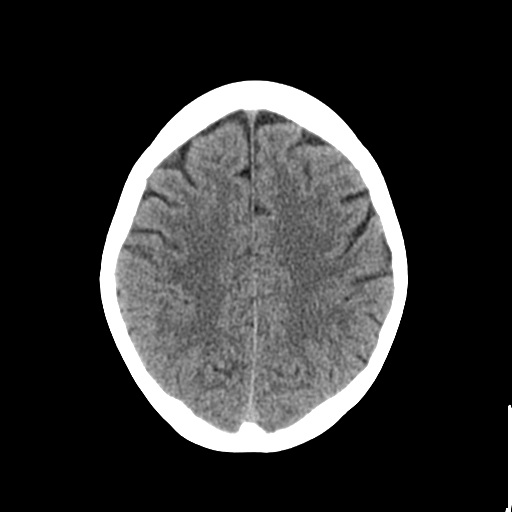
[im 22/29  brain]
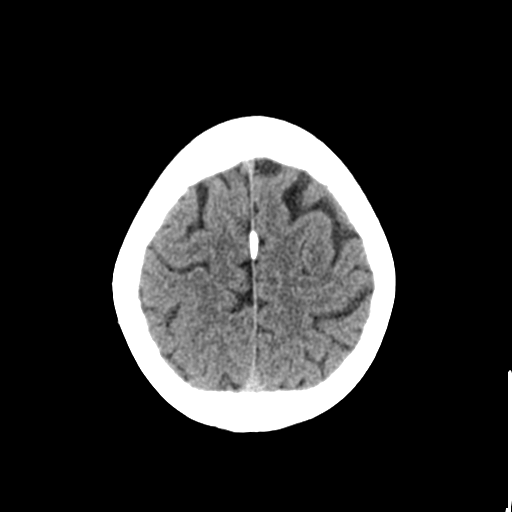
[im 24/29  brain]
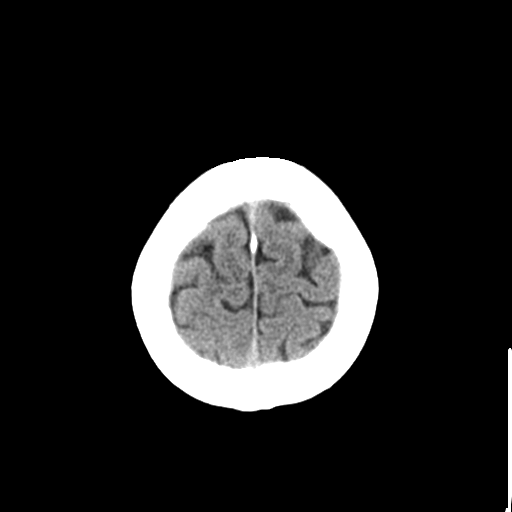
[im 24/29  bone]
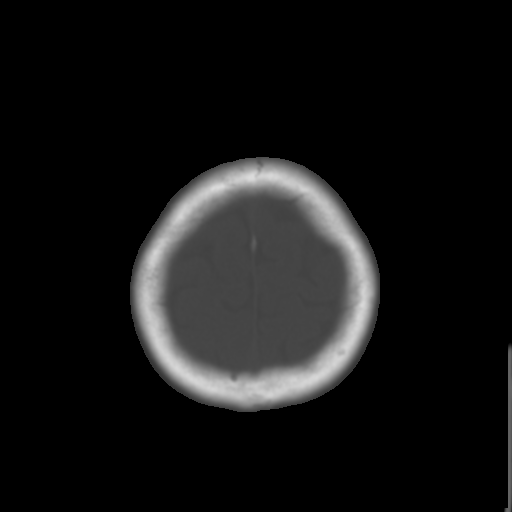
[im 27/29  brain]
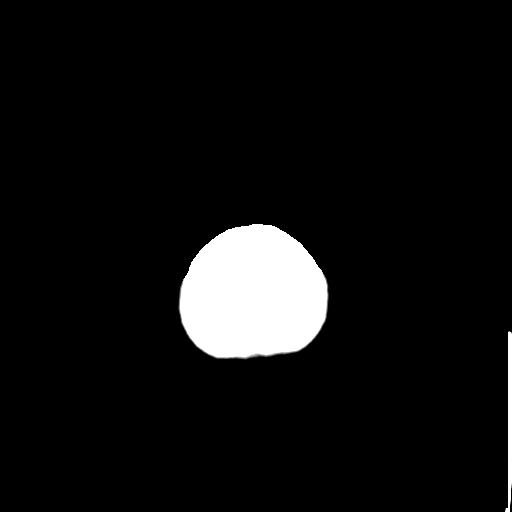

[Series 4: coronal soft tissue · coronal · 0.33mm/px · 3 of 62 slices shown]
[im 21/62  brain]
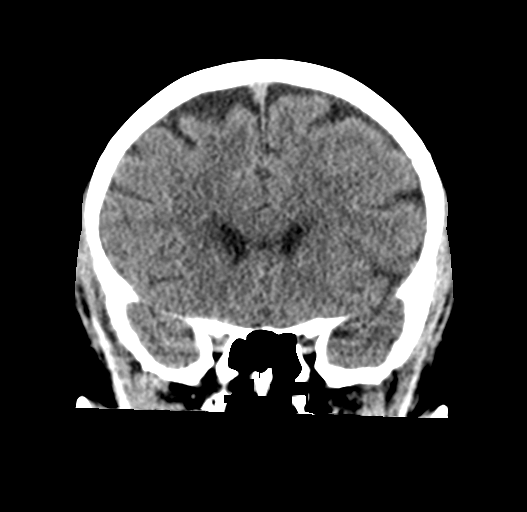
[im 28/62  brain]
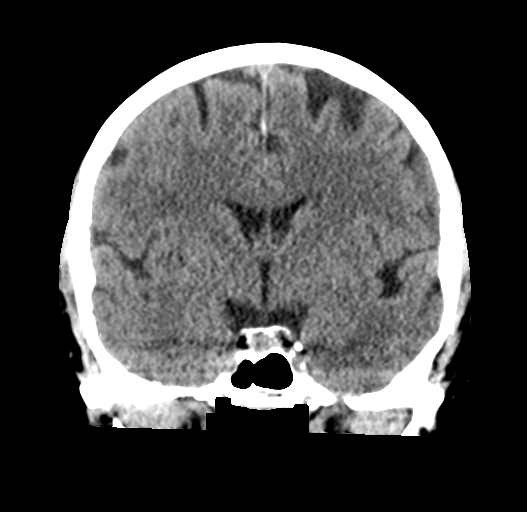
[im 34/62  brain]
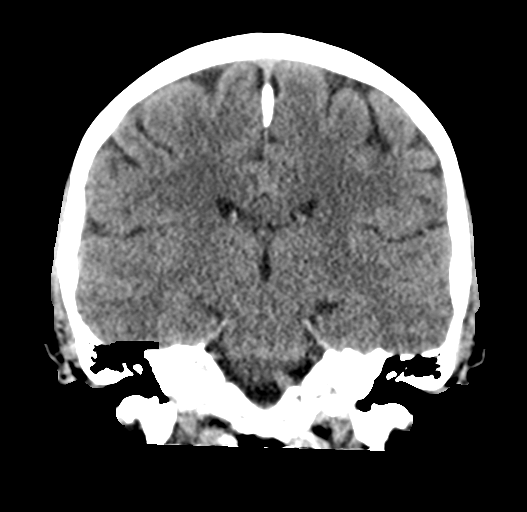

[Series 5: sagittal soft tissue · sagittal · 0.33mm/px · 3 of 51 slices shown]
[im 17/51  brain]
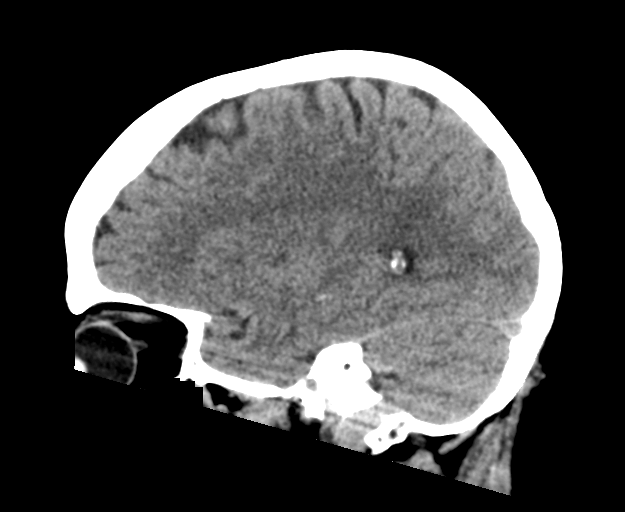
[im 26/51  brain]
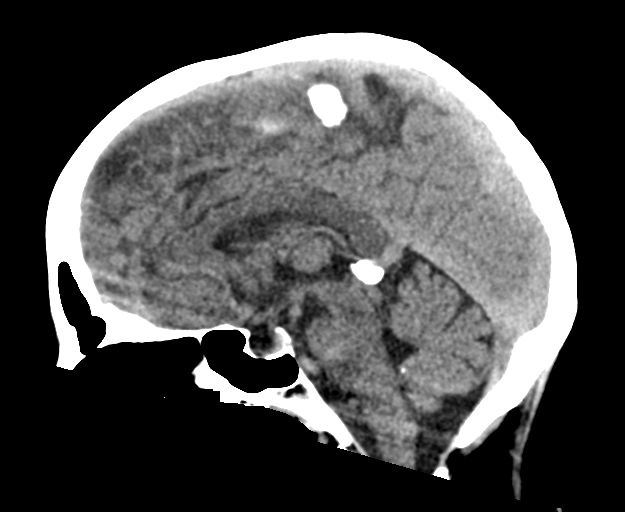
[im 34/51  brain]
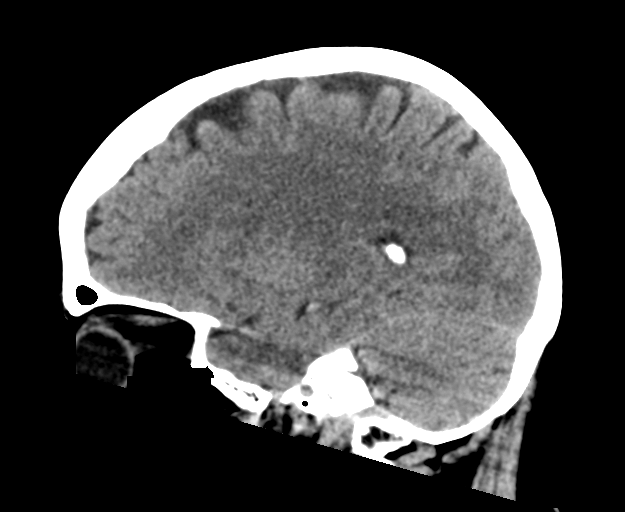

[16 of 46 positions shown; findings below may reference images not displayed]

FINDINGS: Brain: 0.8 by 0.5 cm chronic lacunar infarct of the head of the
right caudate nucleus on image 11 series 3. This is sharply defined
on image 25 of series 4 and likely chronic.

Periventricular white matter and corona radiata hypodensities favor
chronic ischemic microvascular white matter disease. Otherwise, the
brainstem, cerebellum, cerebral peduncles, thalamus, basal ganglia,
basilar cisterns, and ventricular system appear within normal
limits.

Vascular: Unremarkable

Skull: Unremarkable

Sinuses/Orbits: Unremarkable

Other: No supplemental non-categorized findings.
IMPRESSION: 1. No acute intracranial findings.
2. Periventricular white matter and corona radiata hypodensities
favor chronic ischemic microvascular white matter disease.
3. Small chronic lacunar infarct of the head of the right caudate
nucleus.

## 2020-12-01 IMAGING — MR MR MRA HEAD W/O CM
1 series · 19 of 48 positions shown · non-contrast
Comparison: Prior head CT from earlier the same day.

CLINICAL DATA: Initial evaluation for neuro deficit, stroke
suspected. Right-sided weakness with abnormal coordination.

EXAM:
MRA HEAD WITHOUT CONTRAST
TECHNIQUE: Angiographic images of the Circle of Willis were obtained using MRA
technique without intravenous contrast.

[Series 5: TOF · axial · 0.5mm · 0.41mm/px · z∈[-22,+70]mm · 19 of 205 slices shown]
[im 1/205]
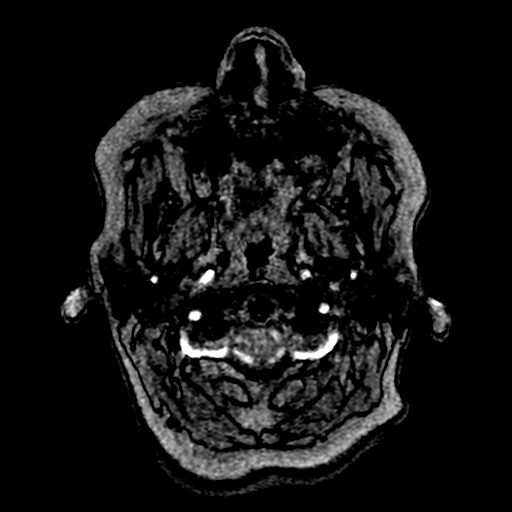
[im 5/205]
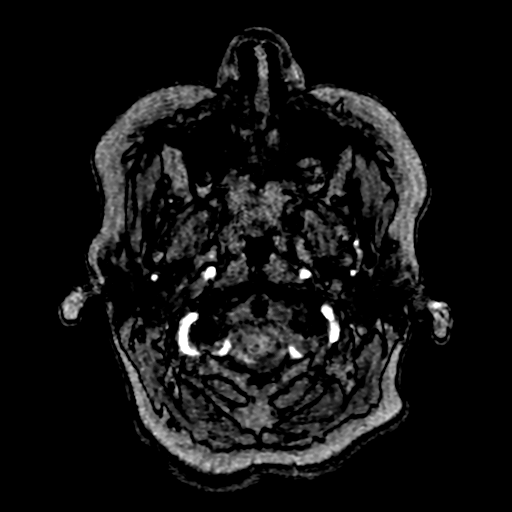
[im 9/205]
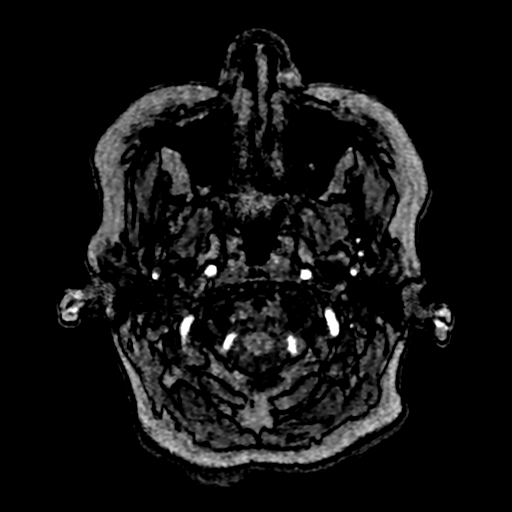
[im 14/205]
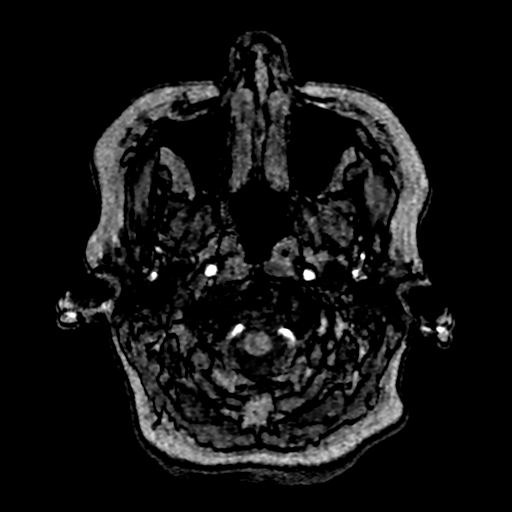
[im 18/205]
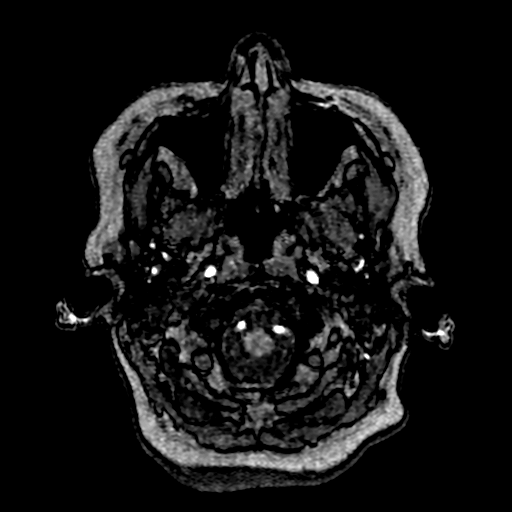
[im 22/205]
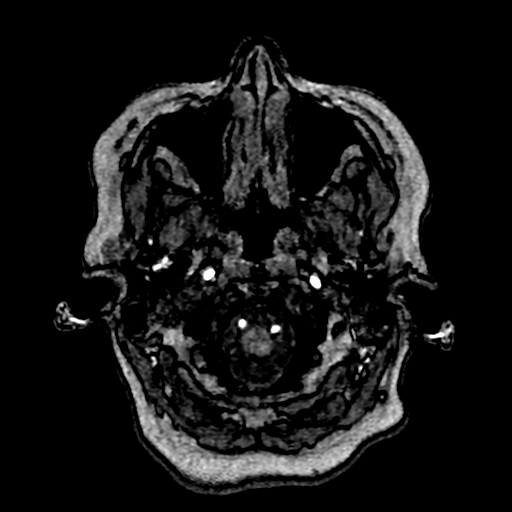
[im 27/205]
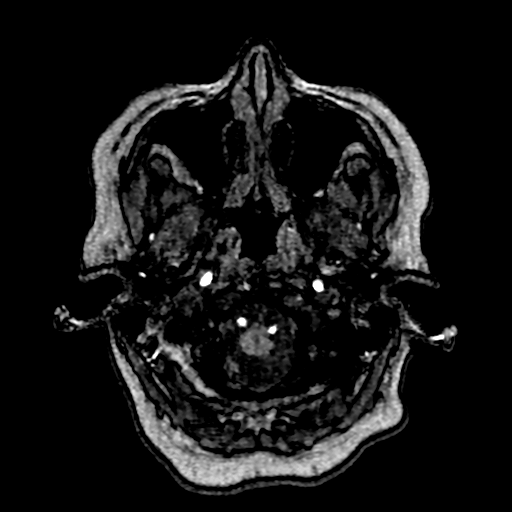
[im 31/205]
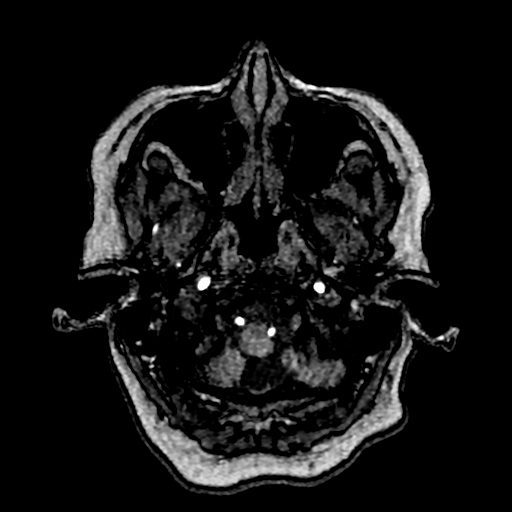
[im 35/205]
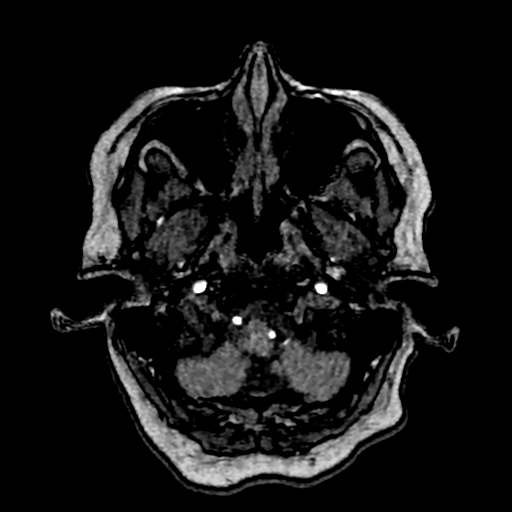
[im 40/205]
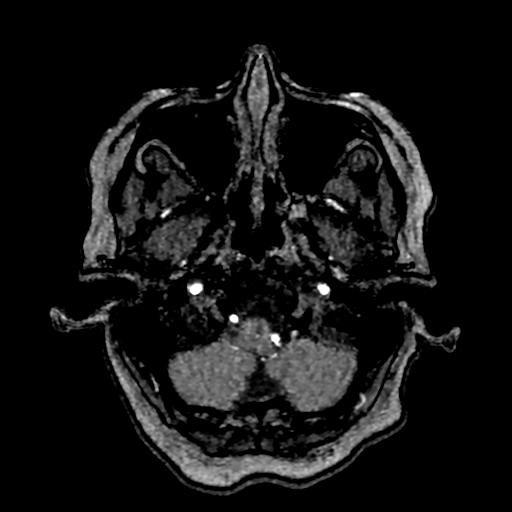
[im 44/205]
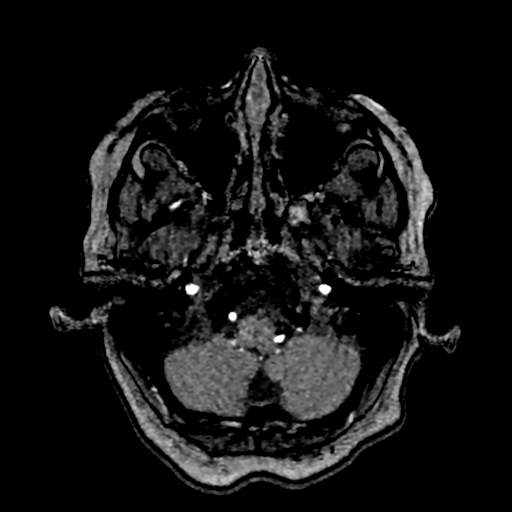
[im 66/205]
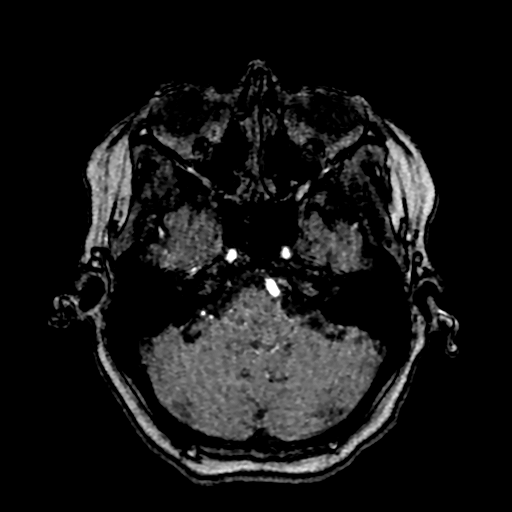
[im 92/205]
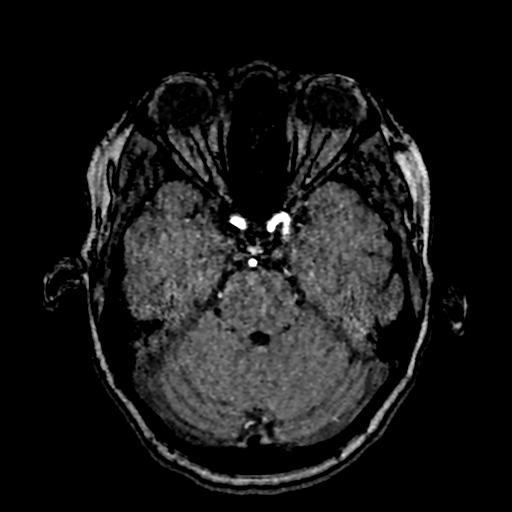
[im 105/205]
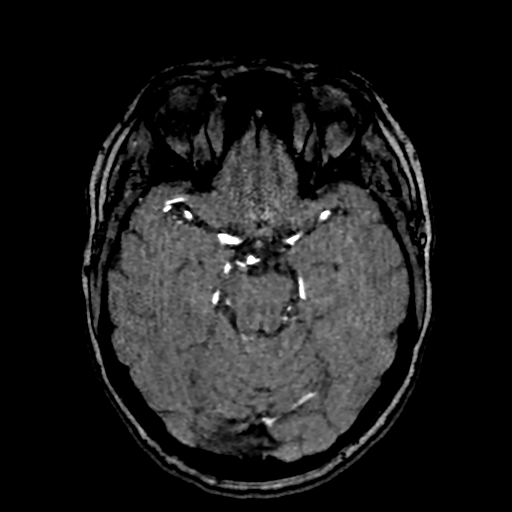
[im 118/205]
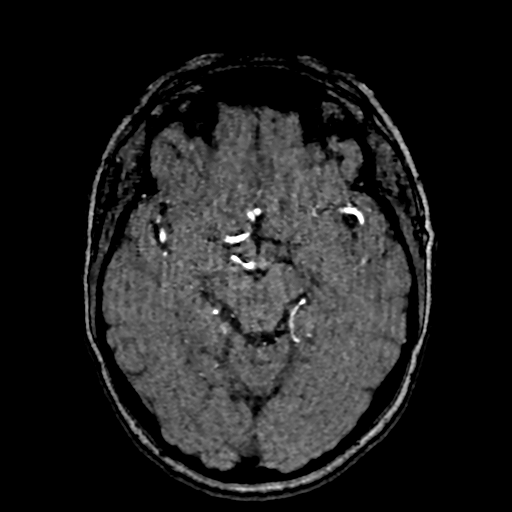
[im 144/205]
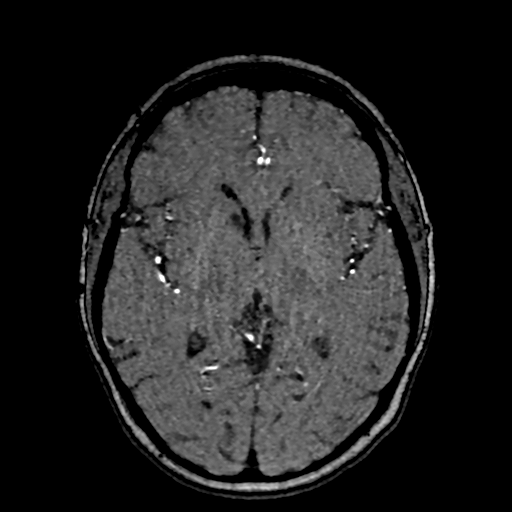
[im 170/205]
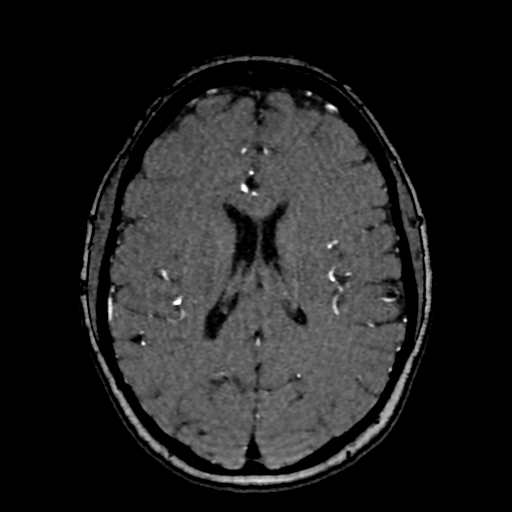
[im 174/205]
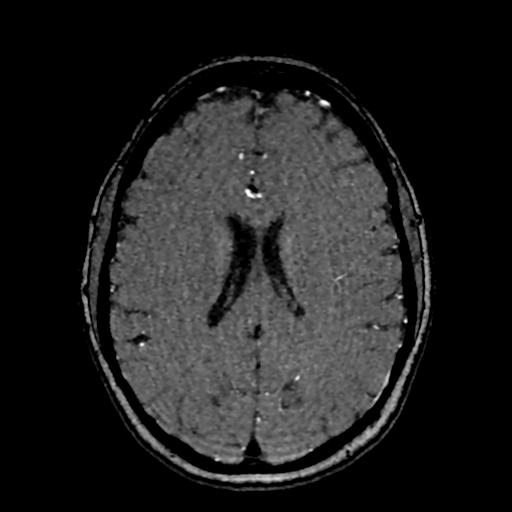
[im 196/205]
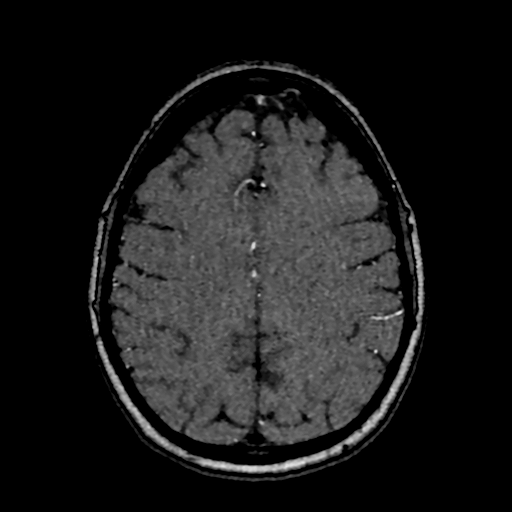

[19 of 48 positions shown; findings below may reference images not displayed]

FINDINGS: ANTERIOR CIRCULATION:

Visualized distal cervical segments of the internal carotid arteries
are patent with antegrade flow. Petrous, cavernous, and supraclinoid
segments patent without hemodynamically significant stenosis. 3 mm
outpouching extending inferiorly from the cavernous left ICA at the
region of the superior hypophyseal artery could reflect a small
vascular infundibulum versus a small aneurysm (series 5, image 83).

A1 segments widely patent. Normal anterior communicating artery
complex. Anterior cerebral arteries patent to their distal aspects
without stenosis. No M1 stenosis or occlusion. Normal MCA
bifurcations. Distal MCA branches well perfused and symmetric. No
proximal MCA branch occlusion.

POSTERIOR CIRCULATION:

Both V4 segments patent to the vertebrobasilar junction without
stenosis. Both PICA origins patent and normal. Basilar widely patent
to its distal aspect without stenosis. Superior cerebellar arteries
patent bilaterally. Both PCAs primarily supplied via the basilar and
are well perfused to there distal aspects.
IMPRESSION: 1. Negative intracranial MRA with no large vessel occlusion or
hemodynamically significant stenosis identified.
2. 3 mm outpouching extending inferiorly from the cavernous left ICA
at the region of the superior hypophyseal artery, which could
reflect a small vascular infundibulum versus a small aneurysm.
Attention at follow-up recommended.

## 2020-12-01 IMAGING — MR MR HEAD W/O CM
10 of 11 series · 38 of 48 positions shown · non-contrast
Comparison: Prior head CT from earlier the same day.

CLINICAL DATA: Initial evaluation for neuro deficit, stroke
suspected, right hand dysmetria.

EXAM:
MRI HEAD WITHOUT CONTRAST
TECHNIQUE: Multiplanar, multiecho pulse sequences of the brain and surrounding
structures were obtained without intravenous contrast.

[Series 5: ax dwi_tracew · axial · 3.0mm · 0.65mm/px · z∈[-74,+73]mm · 3 of 46 slices shown]
[im 1/46]
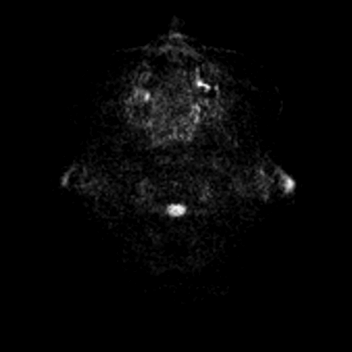
[im 23/46]
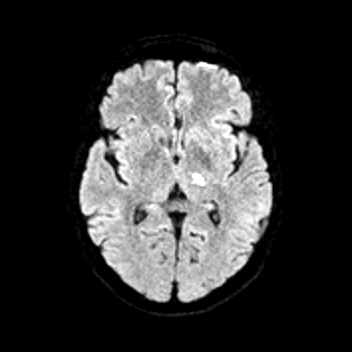
[im 46/46]
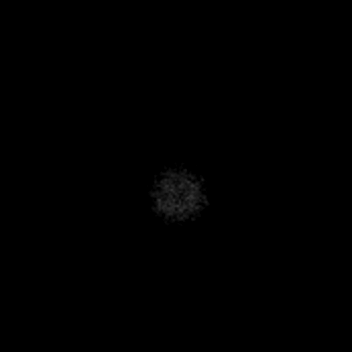

[Series 6: ax dwi_adc · axial · 3.0mm · 0.65mm/px · z∈[-74,+66]mm · 4 of 44 slices shown]
[im 1/44]
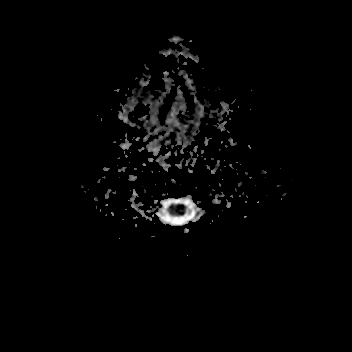
[im 15/44]
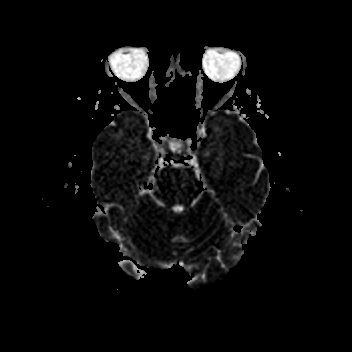
[im 29/44]
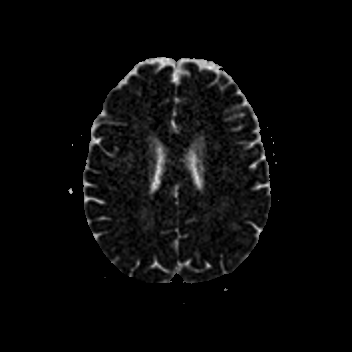
[im 44/44]
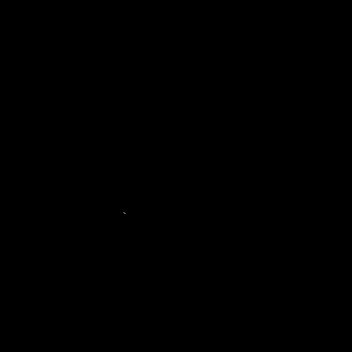

[Series 7: cor dwi_tracew · coronal · 5.0mm · 0.60mm/px · 3 of 36 slices shown]
[im 1/36]
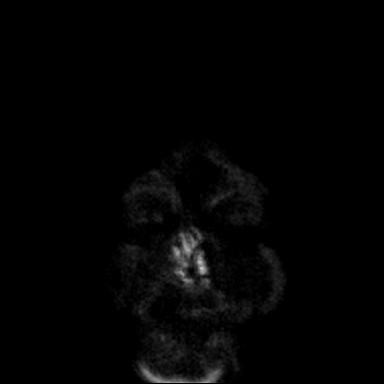
[im 18/36]
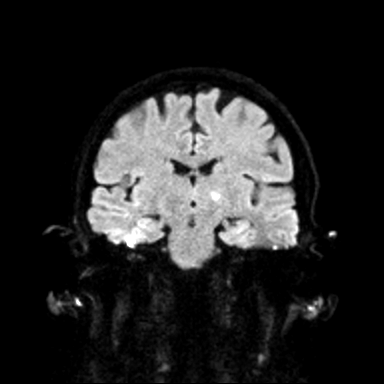
[im 36/36]
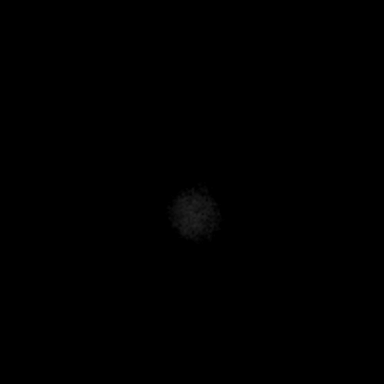

[Series 8: cor dwi_adc · coronal · 5.0mm · 0.60mm/px · 3 of 33 slices shown]
[im 1/33]
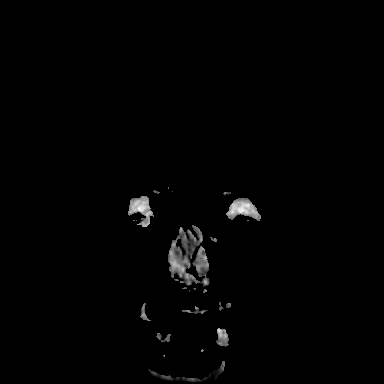
[im 17/33]
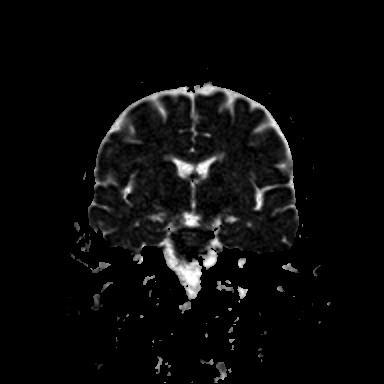
[im 33/33]
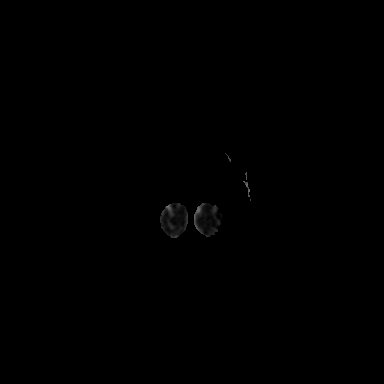

[Series 9: T1 · sagittal · 5.0mm · 0.62mm/px · 2 of 21 slices shown (1 of 2)]
[im 1/21]
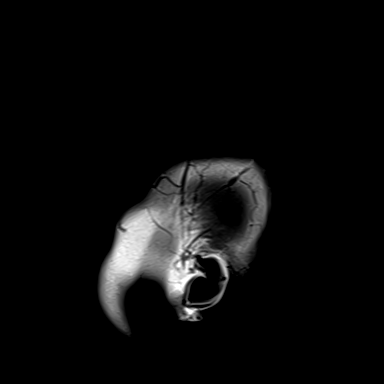
[im 21/21]
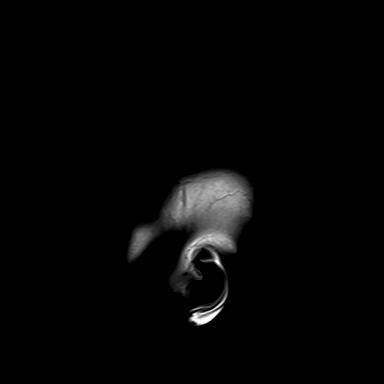

[Series 10: T2 · axial · 5.0mm · 0.45mm/px · z∈[-71,+71]mm · 2 of 25 slices shown (1 of 2)]
[im 1/25]
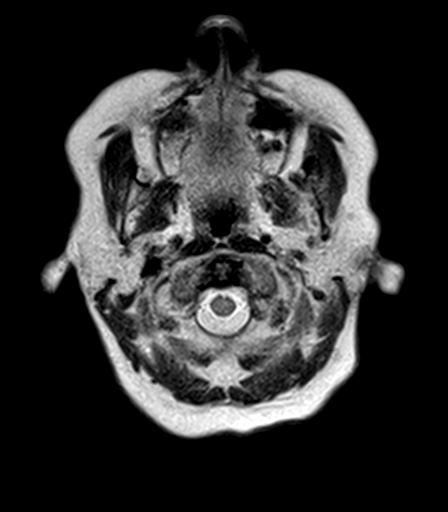
[im 25/25]
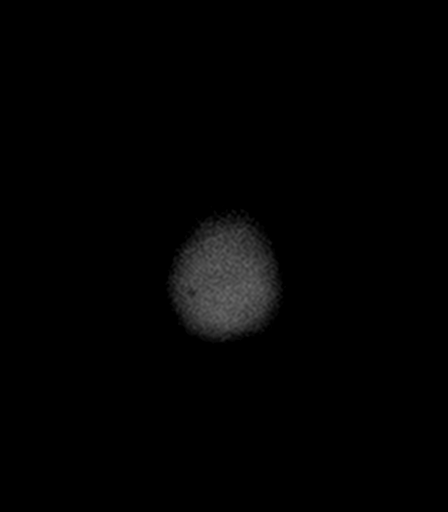

[Series 12: pha_images · axial · 3.0mm · 0.90mm/px · z∈[-75,+76]mm · 5 of 52 slices shown]
[im 1/52]
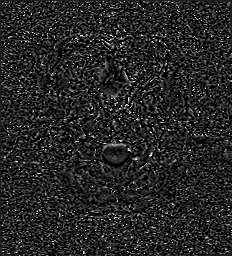
[im 13/52]
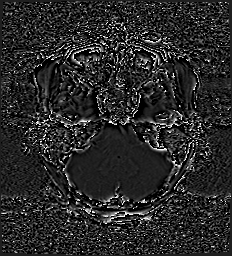
[im 26/52]
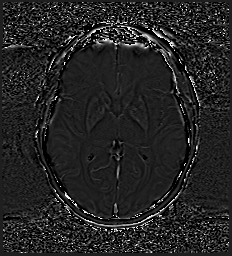
[im 39/52]
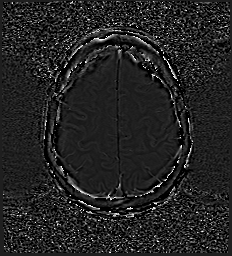
[im 52/52]
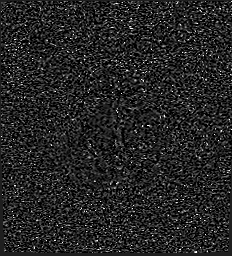

[Series 15: FLAIR · axial · 3.0mm · 0.53mm/px · z∈[-73,+71]mm · 5 of 50 slices shown]
[im 1/50]
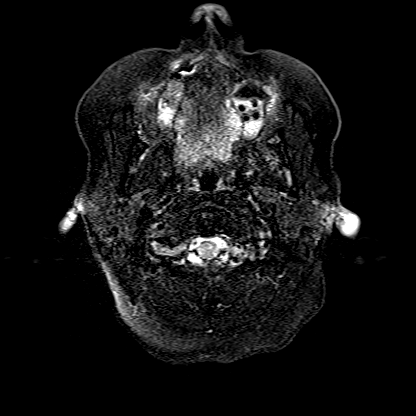
[im 13/50]
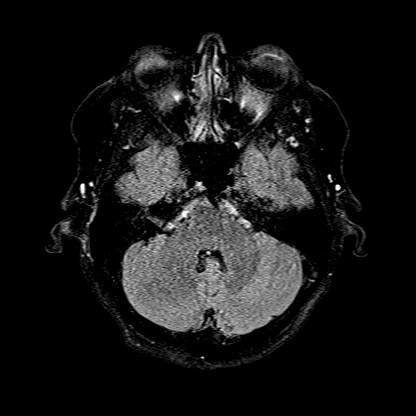
[im 25/50]
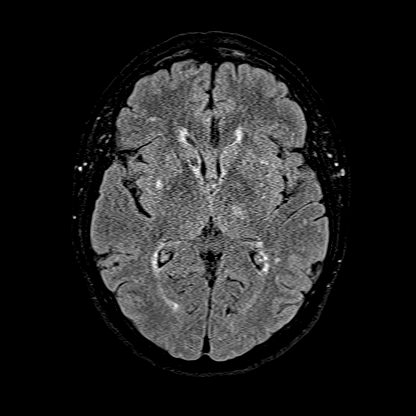
[im 37/50]
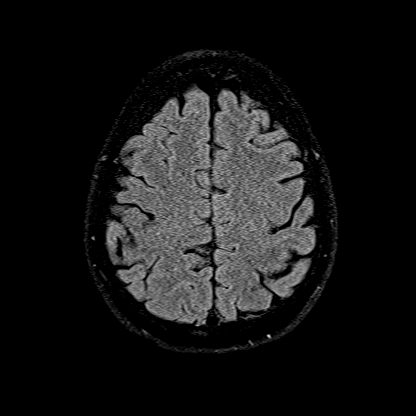
[im 50/50]
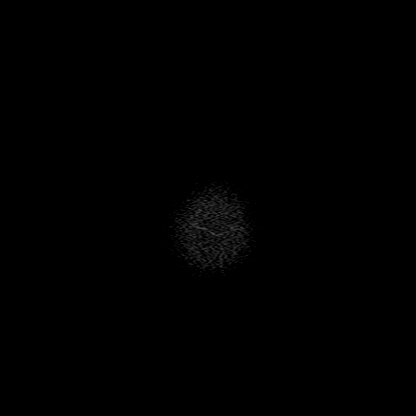

[Series 16: T1 · axial · 1.0mm · 0.98mm/px · z∈[-69,+72]mm · 8 of 144 slices shown (2 of 2)]
[im 1/144]
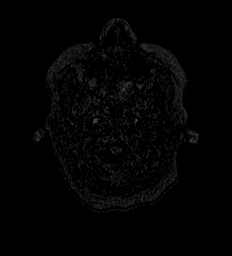
[im 24/144]
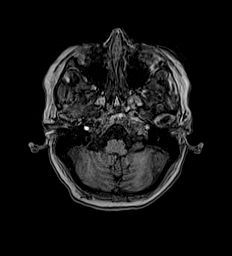
[im 48/144]
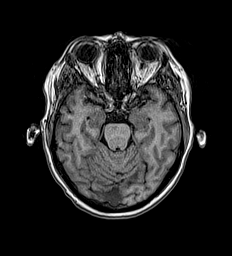
[im 60/144]
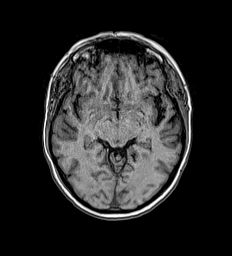
[im 84/144]
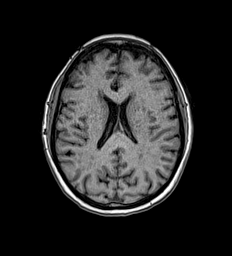
[im 96/144]
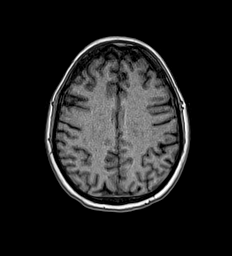
[im 120/144]
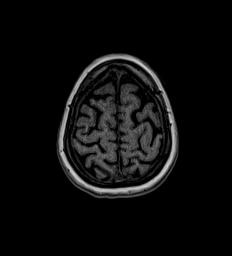
[im 144/144]
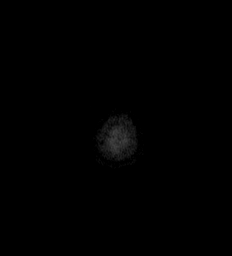

[Series 17: T2 · coronal · 5.0mm · 0.45mm/px · 3 of 29 slices shown (2 of 2)]
[im 1/29]
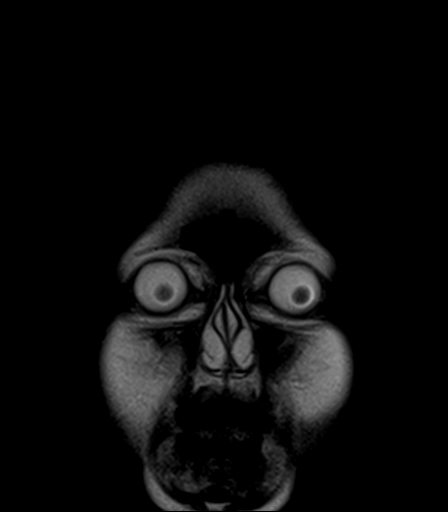
[im 15/29]
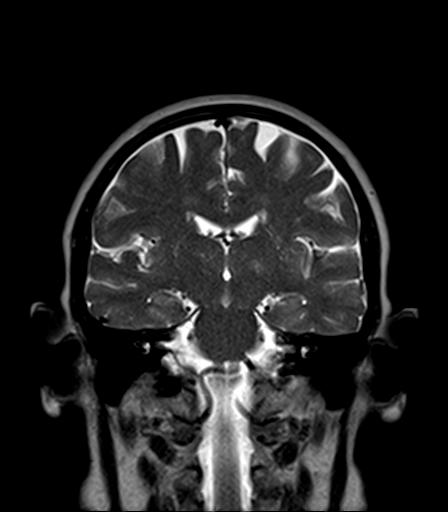
[im 29/29]
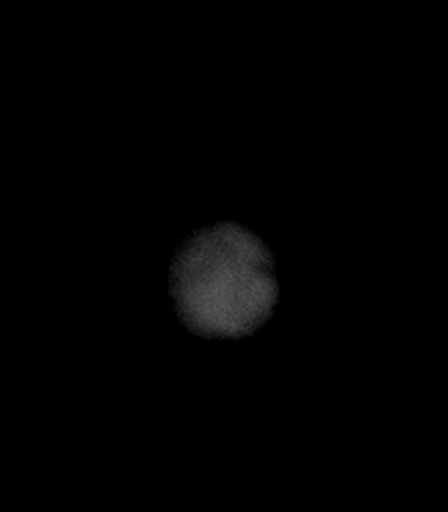

[38 of 48 positions shown; findings below may reference images not displayed]

FINDINGS: Brain: Cerebral volume within normal limits for age. Scattered
patchy T2/FLAIR hyperintensity within the periventricular and deep
white matter both cerebral hemispheres most consistent with chronic
small vessel ischemic disease, mild to moderate in nature.
Superimposed remote lacunar infarct present at the right caudate
head.

1.2 cm acute ischemic infarct present at the left thalamus (series
5, image 23). No associated hemorrhage or mass effect. No other
evidence for acute or subacute ischemia. Gray-white matter
differentiation otherwise maintained. No other acute intracranial
hemorrhage. Single punctate chronic microhemorrhage noted at the
mesial right temporal lobe, of doubtful significance in isolation.

No mass lesion, midline shift or mass effect. No hydrocephalus or
extra-axial fluid collection. Pituitary gland and suprasellar region
within normal limits. Midline structures intact and normal.

Vascular: Major intracranial vascular flow voids are well
maintained.

Skull and upper cervical spine: Craniocervical junction within
normal limits. Bone marrow signal intensity normal. No scalp soft
tissue abnormality.

Sinuses/Orbits: Globes and orbital soft tissues within normal
limits. Paranasal sinuses are largely clear. No mastoid effusion.
Inner ear structures grossly normal.

Other: None.
IMPRESSION: 1. 1.2 cm acute ischemic nonhemorrhagic left thalamic infarct.
2. Underlying mild to moderate chronic microvascular ischemic
disease with remote lacunar infarct at the right caudate head.

## 2020-12-01 NOTE — ED Notes (Signed)
Pt presents to ED with c/o of having R sided coordination issues. Pt states she is having difficulty writing with her R hand and has also been "running into things". Pt states she noticed this started happening last night. Pt states she also spilled her coffee this morning. Pt also states a minimal period of blurry vision in both eyes. Pt is currently A&Ox4. Pt denies injury or trauma to her head. Pt also c/o of brief intermittent period where to R side of face. Pt denies HX of stroke to this RN. Pt has a symetrical smile at this time.

## 2020-12-01 NOTE — ED Notes (Signed)
Patient returned from MRI.

## 2020-12-01 NOTE — ED Notes (Signed)
Patient taken to MRI

## 2020-12-01 NOTE — ED Triage Notes (Addendum)
Patient presents to the ED with increased lack of coordination to her side.  Patient states yesterday she woke up with blurry vision and bilateral weakness/lack of coordination.  Patient states today, her vision is improved but she noticed her right side to be more weak/uncoordinated than her left.  Patient states, "I don't want to get my hand near my face because I might accidentally hit myself in the head."  Patient reports last night she had an episode where the right side of her face felt numb.  Patient denies confusion, nausea and vomiting. Patient is alert and oriented x 4 with clear speech.  Patient is currently hypertensive with no history of the same and is complaining of a headache.

## 2020-12-01 NOTE — ED Provider Notes (Signed)
[facility] Emergency Department Provider Note  ____________________________________________  Time seen: Approximately 5:01 PM  I have reviewed the triage vital signs and the nursing notes.   HISTORY  Chief Complaint Weakness    HPI Sherry Oliver is a 60 y.o. female who presents the emergency department for complaints of of incoordination.  Patient states that she woke up yesterday with bilateral blurred vision.  She states that she wears glasses when she woke up she noticed that her eyesight was significantly more blurry than normal.  Even with her glasses she states that her vision has been slightly blurry.  Patient states that yesterday she did not have a headache, did not have any, unilateral weakness but felt like her extremities were not coordinated.  She states that she was walking through the grocery store she felt like her feet were driving again, hitting objects without her control.  She states that last night she was trying to walk through a doorway and ended up walking into the door jam itself.  Patient awoke this morning and states that symptoms were largely improved though she had the symptoms on the right side where they had improved on the left.  She states that she went to drink a couple coffee, she felt like her arm was not coordinated and she ended up spilling coffee down her chest wall.  She did have a headache today but she has a history of migraines.  She states that the headache does not appear atypical for her normal migraine.  She has a history of allergies, GERD, migraines, hypertension, sleep apnea.  She does not take any medication for this complaint.  She went to urgent care and given the symptoms she was referred to the emergency department for evaluation for CVA/TIA.         Past Medical History:  Diagnosis Date  . Allergy   . Gastropathy   . GERD (gastroesophageal reflux disease)   . Headache   . Hyperlipidemia   . Hypertension    . Hypothyroid   . Lumbar radiculopathy   . Lung nodule   . Sleep apnea     Patient Active Problem List   Diagnosis Date Noted  . Elevated hemoglobin A1c 04/20/2020  . Thrombocytosis 04/20/2020  . OSA on CPAP 11/17/2019  . Former smoker 11/17/2019  . Drug-induced constipation 11/17/2019  . Obesity (BMI 30.0-34.9) 11/17/2019  . Acquired hypothyroidism 11/17/2019  . 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 Mixed hyperlipidemia 11/17/2019  . Restless leg syndrome 11/17/2019  . Centrilobular emphysema ([place]) 11/17/2019  . Family history of BRCA2 gene positive 11/17/2019    Past Surgical History:  Procedure Laterality Date  . APPENDECTOMY  2008  . BLADDER SURGERY  1965   bladder stem  . BREAST BIOPSY Left 06/06/2019   Lymph node US Bx, hydromarker. pending path   . COLONOSCOPY WITH PROPOFOL N/A 02/19/2015   Procedure: COLONOSCOPY WITH PROPOFOL;  Surgeon: Lollie Sails, MD;  Location: [facility] ENDOSCOPY;  Service: Endoscopy;  Laterality: N/A;  . COLONOSCOPY WITH PROPOFOL N/A 03/24/2018   Procedure: COLONOSCOPY WITH PROPOFOL;  Surgeon: Lollie Sails, MD;  Location: [facility] ENDOSCOPY;  Service: Endoscopy;  Laterality: N/A;  . ELBOW SURGERY Right 1982  . ESOPHAGOGASTRODUODENOSCOPY    . FOOT SURGERY Left 2007   neuroma removal, repeat 2008    Prior to Admission medications   Medication Sig Start Date End Date Taking? Authorizing Provider  acetaminophen (TYLENOL) 500 MG tablet Take by mouth.    [provider]  Ascorbic Acid (VITAMIN C) 1000 MG tablet Take 1,000 mg by mouth daily.    [provider]  b complex vitamins tablet Take 1 tablet by mouth daily.    [provider]  Calcium Carbonate-Vitamin D 600-400 MG-UNIT per tablet Take 1 tablet by mouth daily.    [provider]  clobetasol ointment (TEMOVATE) 3.71 % Apply 1 application topically 2 (two) times daily as needed (rash).    [provider]  desoximetasone (TOPICORT) 0.25 % cream  Apply to affected areas twice daily. Avoid applying to face, groin, and axilla. Use as directed. 01/29/20   Brendolyn Patty, MD  diclofenac Sodium (VOLTAREN) 1 % GEL Apply 2 g topically 3 (three) times daily as needed. 03/22/20   Karamalegos, Alexander J, DO  DUPIXENT 300 MG/2ML SOPN INJECT 300 MG (1 PEN) UNDER THE SKIN EVERY 2 WEEKS 07/10/20   Brendolyn Patty, MD  esomeprazole (NEXIUM) 20 MG capsule Take 20 mg by mouth in the morning and at bedtime.    [provider]  fluticasone (FLONASE) 50 MCG/ACT nasal spray Place 2 sprays into both nostrils daily.     [provider]  Fluticasone-Umeclidin-Vilant (TRELEGY ELLIPTA) 100-62.5-25 MCG/INH AEPB Inhale into the lungs. 06/22/19   [provider]  ibuprofen (ADVIL,MOTRIN) 200 MG tablet Take 400 mg by mouth 2 (two) times daily.    [provider]  levothyroxine (SYNTHROID) 88 MCG tablet Take 1 tablet (88 mcg total) by mouth daily before breakfast. 07/16/20   Malfi, Lupita Raider, FNP  liothyronine (CYTOMEL) 5 MCG tablet Take 1 tablet (5 mcg total) by mouth daily. 07/16/20   Malfi, Lupita Raider, FNP  montelukast (SINGULAIR) 10 MG tablet Take 10 mg by mouth daily. 10/09/20   [provider]  Multiple Vitamin (MULTIVITAMIN WITH MINERALS) TABS tablet Take 1 tablet by mouth daily.    [provider]  omeprazole (PRILOSEC) 40 MG capsule Take by mouth. 12/11/19 12/10/20  [provider]  pramipexole (MIRAPEX) 1.5 MG tablet Take 0.375 mg by mouth at bedtime. Quarter tab nightly     [provider]  tacrolimus (PROTOPIC) 0.1 % ointment APPLY TO AFFECTED AREA TWICE A DAY AS DIRECTED 08/09/19   [provider]    Allergies Balsam, Benzalkonium chloride, Bronopol, Chlorzoxazone, Chromium, Cocamidopropyl betaine, Crestor [rosuvastatin], Elemental sulfur, Glutaral, Latex, Methyldibromoglutaronitrile, Propylene glycol, Tomato, Benzocaine, Cobalt, Dibucaine, Formaldehyde, Nickel sulfate [nickel], Other,  Penicillins, Potassium dichromate, and Tetracaine  Family History  Problem Relation Age of Onset  . Breast cancer Mother 45  . BRCA 1/2 Mother        positive for Brca 2  . Cancer Mother        bladder  . Breast cancer Maternal Aunt 37  . Breast cancer Maternal Grandfather        70's  . Alcohol abuse Father   . Lung cancer Father 60       mets to brain  . Heart disease Maternal Grandmother   . Stroke Paternal Grandmother     Social History Social History   Tobacco Use  . Smoking status: Former Smoker    Types: Cigarettes    Quit date: 03/14/2018    Years since quitting: 2.7  . Smokeless tobacco: Former Network engineer  . Vaping Use: Never used  Substance Use Topics  . Alcohol use: Yes    Comment: rare  . Drug use: Not Currently     Review of Systems  Constitutional: No fever/chills Eyes: No visual changes.  No discharge ENT: No upper respiratory complaints. Cardiovascular: no chest pain. Respiratory: no cough. No SOB. Gastrointestinal: No abdominal pain.  No nausea, no vomiting.  No diarrhea.  No constipation. Genitourinary: Negative for dysuria. No hematuria Musculoskeletal: Negative for musculoskeletal pain. Skin: Negative for rash, abrasions, lacerations, ecchymosis. Neurological: Positive for headache, has had some visual changes as well as some "incoordination of extremities".  Denies numbness.  10 System ROS otherwise negative.  ____________________________________________   PHYSICAL EXAM:  VITAL SIGNS: ED Triage Vitals [12/01/20 1638]  Enc Vitals Group     BP (!) 170/115     Pulse Rate (!) 103     Resp 18     Temp 98.5 F (36.9 C)     Temp Source Oral     SpO2 95 %     Weight 170 lb (77.1 kg)     Height '5\' 2"'  (1.575 m)     Head Circumference      Peak Flow      Pain Score 4     Pain Loc      Pain Edu?      Excl. in [place]?      Constitutional: Alert and oriented. Well appearing and in no acute distress. Eyes: Conjunctivae are normal.  PERRL. EOMI. Head: Atraumatic. ENT:      Ears:       Nose: No congestion/rhinnorhea.      Mouth/Throat: Mucous membranes are moist.  Neck: No stridor.  Neck is supple full range of motion.  No bruits on exam Hematological/Lymphatic/Immunilogical: No cervical lymphadenopathy. Cardiovascular: Normal rate, regular rhythm. Normal S1 and S2.  Good peripheral circulation. Respiratory: Normal respiratory effort without tachypnea or retractions. Lungs CTAB. Good air entry to the bases with no decreased or absent breath sounds. Gastrointestinal: Bowel sounds 4 quadrants. Soft and nontender to palpation. No guarding or rigidity. No palpable masses. No distention. No CVA tenderness. Musculoskeletal: Full range of motion to all extremities. No gross deformities appreciated. Neurologic:  Normal speech and language. No gross focal neurologic deficits are appreciated.  Cranial nerves II through XII grossly intact.  Patient did have difficulty with touching my finger and then to her nose.  There was some slight incoordination on the right side.  Reassuring response on the left side.  Negative for Romberg or pronator drift.  Equal grip strength bilateral upper extremities.  Patient is able to approximate the digits appropriately on bilateral upper extremities. Skin:  Skin is warm, dry and intact. No rash noted. Psychiatric: Mood and affect are normal. Speech and behavior are normal. Patient exhibits appropriate insight and judgement.   ____________________________________________   LABS (all labs ordered are listed, but only abnormal results are displayed)  Labs Reviewed  APTT - Abnormal; Notable for the following components:      Result Value   aPTT <24 (*)    All other components within normal limits  COMPREHENSIVE METABOLIC PANEL - Abnormal; Notable for the following components:   Glucose, Bld 110 (*)    All other components within normal limits  URINALYSIS, COMPLETE (UACMP) WITH MICROSCOPIC -  Abnormal; Notable for the following components:   Color, Urine STRAW (*)    APPearance CLEAR (*)    All other components within normal limits  PROTIME-INR  CBC  DIFFERENTIAL  I-STAT CREATININE, ED  CBG MONITORING, ED   ____________________________________________  EKG   ____________________________________________  RADIOLOGY I personally viewed and evaluated these images as part of my medical decision making, as well as reviewing the written  report by the radiologist.  ED Provider Interpretation: Old lacunar infarct identified on CT head without, no intracranial hemorrhage  CT HEAD WO CONTRAST  Result Date: 12/01/2020 CLINICAL DATA:  Right facial numbness and blurred vision which is resolved. Discoordination and gait abnormality. EXAM: CT HEAD WITHOUT CONTRAST TECHNIQUE: Contiguous axial images were obtained from the base of the skull through the vertex without intravenous contrast. COMPARISON:  None. FINDINGS: Brain: 0.8 by 0.5 cm chronic lacunar infarct of the head of the right caudate nucleus on image 11 series 3. This is sharply defined on image 25 of series 4 and likely chronic. Periventricular white matter and corona radiata hypodensities favor chronic ischemic microvascular white matter disease. Otherwise, the brainstem, cerebellum, cerebral peduncles, thalamus, basal ganglia, basilar cisterns, and ventricular system appear within normal limits. Vascular: Unremarkable Skull: Unremarkable Sinuses/Orbits: Unremarkable Other: No supplemental non-categorized findings. IMPRESSION: 1. No acute intracranial findings. 2. Periventricular white matter and corona radiata hypodensities favor chronic ischemic microvascular white matter disease. 3. 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 Electronically Signed   By: Van Clines M.D.   On: 12/01/2020 19:01   MR ANGIO HEAD WO CONTRAST  Result Date: 12/01/2020 CLINICAL DATA:  Initial evaluation for neuro  deficit, stroke suspected. Right-sided weakness with abnormal coordination. EXAM: MRA HEAD WITHOUT CONTRAST TECHNIQUE: Angiographic images of the Circle of Willis were obtained using MRA technique without intravenous contrast. COMPARISON:  Prior head CT from earlier the same day. FINDINGS: ANTERIOR CIRCULATION: Visualized distal cervical segments of the internal carotid arteries are patent with antegrade flow. Petrous, cavernous, and supraclinoid segments patent without hemodynamically significant stenosis. 3 mm outpouching extending inferiorly from the cavernous left ICA at the region of the superior hypophyseal artery could reflect a small vascular infundibulum versus a small aneurysm (series 5, image 83). A1 segments widely patent. Normal anterior communicating artery complex. Anterior cerebral arteries patent to their distal aspects without stenosis. No M1 stenosis or occlusion. Normal MCA bifurcations. Distal MCA branches well perfused and symmetric. No proximal MCA branch occlusion. POSTERIOR CIRCULATION: Both V4 segments patent to the vertebrobasilar junction without stenosis. Both PICA origins patent and normal. Basilar widely patent to its distal aspect without stenosis. Superior cerebellar arteries patent bilaterally. Both PCAs primarily supplied via the basilar and are well perfused to there distal aspects. IMPRESSION: 1. Negative intracranial MRA with no large vessel occlusion or hemodynamically significant stenosis identified. 2. 3 mm outpouching extending inferiorly from the cavernous left ICA at the region of the superior hypophyseal artery, which could reflect a small vascular infundibulum versus a small aneurysm. Attention at follow-up recommended. Electronically Signed   By: Jeannine Boga M.D.   On: 12/01/2020 22:43    ____________________________________________    PROCEDURES  Procedure(s) performed:    Procedures    Medications - No data to  display   ____________________________________________   INITIAL IMPRESSION / ASSESSMENT AND PLAN / ED COURSE  Pertinent labs & imaging results that were available during my care of the patient were reviewed by me and considered in my medical decision making (see chart for details).  Review of the [place] CSRS was performed in accordance of the [place] prior to dispensing any controlled drugs.  Clinical Course as of 12/01/20 2256  Nancy Fetter Dec 01, 2020  2015 Patient's work-up at this time is reassuring with labs, initial scan of the head.  Given the symptoms that patient is experiencing, I feel that MRI is warranted for further evaluation.  Patient will be sent  for MRI of the head at this time [JC]    Clinical Course User Index [JC] Cuthriell, Charline Bills, PA-C          Patient presented to emergency department with vague neuro symptoms x2 days.  Symptoms began yesterday with blurred vision and "incoordination" of her extremities.  Patient states that today if symptoms seem to improve but she still had right-sided uncoordination.  Given the ongoing symptoms she presented to urgent care for evaluation and was sent to the emergency department for evaluation for stroke.  Initial labs are reassuring.  CT scan of her head without contrast revealed no intracranial hemorrhage but an old lacunar infarct.  Given the symptoms I felt the patient warranted further imaging with MRI.  This is currently pending..  This is currently pending.  Patient overall had a reassuring exam.  Given the symptoms, I pursued MRI of the brain to ensure no evidence of of ischemia at this time results are still pending and care will be transferred to attending provider, Dr. Jacqualine Code for final diagnosis and disposition.  Assuming MRIs are reassuring patient will be discharged with follow-up with neurology.   This chart was dictated using voice recognition software/Dragon. Despite best efforts to proofread, errors can occur which can change  the meaning. Any change was purely unintentional.    Darletta Moll, PA-C 12/01/20 2256    Delman Kitten, MD 12/01/20 [phone]

## 2020-12-01 NOTE — ED Triage Notes (Addendum)
Pt states ever since she awoke yesterday morning she felt blurry vision (now improved), right face (jaw) numb which has resolved, gait is "off" and feels overall uncoordinated. Endorses mild pain at right base of skull for past two weeks which she attributes to a muscle tightness in her back

## 2020-12-01 NOTE — ED Provider Notes (Signed)
[facility]    CSN: [id number] Arrival date & time: 12/01/20  1302      History   Chief Complaint Chief Complaint  Patient presents with  . Blurred Vision  . Gait Problem    HPI Sherry Oliver is a 60 y.o. female presenting for concerns about right-sided weakness and abnormal coordination.  Patient states her symptoms started yesterday and she felt weak all over, especially her upper and lower extremities on both sides.  She says that the weakness on the left side has gotten better but she feels weak on the right side and uncoordinated.  She says that yesterday she had some numbness along her lower right face and jaw.  She says that has resolved today.  Additionally, she says that she woke up yesterday with blurry vision and no longer has blurry vision.  She states that she has difficulty writing and forming the letters which is not typically a problem for her.  She denies any falls or injuries.  She denies any headaches or dizziness.  Patient says that she feels very uncoordinated and has even gone into a door frame.  She says that she feels like her right foot is getting caught sometimes when she is walking because she is not picking it up all the way.  She also admits to some chronic right-sided neck pain and lower back pain and believes her symptoms are due to the neck and back pain. Denies LOC, seizures.   She has not taken any medication for symptoms.  She has not been evaluated for this before.  Denies similar problems in the past.  No other concerns today.  HPI  Past Medical History:  Diagnosis Date  . Allergy   . Gastropathy   . GERD (gastroesophageal reflux disease)   . Headache   . Hyperlipidemia   . Hypertension   . Hypothyroid   . Lumbar radiculopathy   . Lung nodule   . Sleep apnea     Patient Active Problem List   Diagnosis Date Noted  . Elevated hemoglobin A1c 04/20/2020  . Thrombocytosis 04/20/2020  . OSA on CPAP 11/17/2019  . Former smoker  11/17/2019  . Drug-induced constipation 11/17/2019  . Obesity (BMI 30.0-34.9) 11/17/2019  . Acquired hypothyroidism 11/17/2019  . 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 Mixed hyperlipidemia 11/17/2019  . Restless leg syndrome 11/17/2019  . Centrilobular emphysema ([place]) 11/17/2019  . Family history of BRCA2 gene positive 11/17/2019    Past Surgical History:  Procedure Laterality Date  . APPENDECTOMY  2008  . BLADDER SURGERY  1965   bladder stem  . BREAST BIOPSY Left 06/06/2019   Lymph node US Bx, hydromarker. pending path   . COLONOSCOPY WITH PROPOFOL N/A 02/19/2015   Procedure: COLONOSCOPY WITH PROPOFOL;  Surgeon: Lollie Sails, MD;  Location: [facility] ENDOSCOPY;  Service: Endoscopy;  Laterality: N/A;  . COLONOSCOPY WITH PROPOFOL N/A 03/24/2018   Procedure: COLONOSCOPY WITH PROPOFOL;  Surgeon: Lollie Sails, MD;  Location: [facility] ENDOSCOPY;  Service: Endoscopy;  Laterality: N/A;  . ELBOW SURGERY Right 1982  . ESOPHAGOGASTRODUODENOSCOPY    . FOOT SURGERY Left 2007   neuroma removal, repeat 2008    OB History   No obstetric history on file.      Home Medications    Prior to Admission medications   Medication Sig Start Date End Date Taking? Authorizing Provider  acetaminophen (TYLENOL) 500 MG tablet Take by mouth.    [provider]  Ascorbic Acid (VITAMIN C) 1000  MG tablet Take 1,000 mg by mouth daily.    [provider]  b complex vitamins tablet Take 1 tablet by mouth daily.    [provider]  Calcium Carbonate-Vitamin D 600-400 MG-UNIT per tablet Take 1 tablet by mouth daily.    [provider]  clobetasol ointment (TEMOVATE) 5.18 % Apply 1 application topically 2 (two) times daily as needed (rash).    [provider]  desoximetasone (TOPICORT) 0.25 % cream Apply to affected areas twice daily. Avoid applying to face, groin, and axilla. Use as directed. 01/29/20   Brendolyn Patty, MD  diclofenac Sodium (VOLTAREN) 1 % GEL Apply 2 g  topically 3 (three) times daily as needed. 03/22/20   Karamalegos, Alexander J, DO  DUPIXENT 300 MG/2ML SOPN INJECT 300 MG (1 PEN) UNDER THE SKIN EVERY 2 WEEKS 07/10/20   Brendolyn Patty, MD  esomeprazole (NEXIUM) 20 MG capsule Take 20 mg by mouth in the morning and at bedtime.    [provider]  fluticasone (FLONASE) 50 MCG/ACT nasal spray Place 2 sprays into both nostrils daily.     [provider]  Fluticasone-Umeclidin-Vilant (TRELEGY ELLIPTA) 100-62.5-25 MCG/INH AEPB Inhale into the lungs. 06/22/19   [provider]  ibuprofen (ADVIL,MOTRIN) 200 MG tablet Take 400 mg by mouth 2 (two) times daily.    [provider]  levothyroxine (SYNTHROID) 88 MCG tablet Take 1 tablet (88 mcg total) by mouth daily before breakfast. 07/16/20   Malfi, Lupita Raider, FNP  liothyronine (CYTOMEL) 5 MCG tablet Take 1 tablet (5 mcg total) by mouth daily. 07/16/20   Malfi, Lupita Raider, FNP  montelukast (SINGULAIR) 10 MG tablet Take 10 mg by mouth daily. 10/09/20   [provider]  Multiple Vitamin (MULTIVITAMIN WITH MINERALS) TABS tablet Take 1 tablet by mouth daily.    [provider]  omeprazole (PRILOSEC) 40 MG capsule Take by mouth. 12/11/19 12/10/20  [provider]  pramipexole (MIRAPEX) 1.5 MG tablet Take 0.375 mg by mouth at bedtime. Quarter tab nightly     [provider]  tacrolimus (PROTOPIC) 0.1 % ointment APPLY TO AFFECTED AREA TWICE A DAY AS DIRECTED 08/09/19   [provider]    Family History Family History  Problem Relation Age of Onset  . Breast cancer Mother 49  . BRCA 1/2 Mother        positive for Brca 2  . Cancer Mother        bladder  . Breast cancer Maternal Aunt 33  . Breast cancer Maternal Grandfather        70's  . Alcohol abuse Father   . Lung cancer Father 60       mets to brain  . Heart disease Maternal Grandmother   . Stroke Paternal Grandmother     Social History Social History   Tobacco Use  . Smoking  status: Former Smoker    Types: Cigarettes    Quit date: 03/14/2018    Years since quitting: 2.7  . Smokeless tobacco: Former Network engineer  . Vaping Use: Never used  Substance Use Topics  . Alcohol use: Yes    Comment: rare  . Drug use: Not Currently     Allergies   Balsam, Benzalkonium chloride, Bronopol, Chlorzoxazone, Chromium, Cocamidopropyl betaine, Crestor [rosuvastatin], Elemental sulfur, Glutaral, Latex, Methyldibromoglutaronitrile, Propylene glycol, Tomato, Benzocaine, Cobalt, Dibucaine, Formaldehyde, Nickel sulfate [nickel], Other, Penicillins, Potassium dichromate, and Tetracaine   Review of Systems Review of Systems  Constitutional: Positive for fatigue. Negative for  fever.  Eyes: Negative for photophobia and visual disturbance.  Respiratory: Negative for chest tightness and shortness of breath.   Cardiovascular: Negative for chest pain and palpitations.  Gastrointestinal: Negative for abdominal pain, nausea and vomiting.  Genitourinary: Negative for dysuria.  Musculoskeletal: Positive for back pain and neck pain. Negative for neck stiffness.  Neurological: Positive for weakness. Negative for dizziness, tremors, seizures, syncope, facial asymmetry, speech difficulty, light-headedness, numbness and headaches.     Physical Exam Triage Vital Signs ED Triage Vitals  Enc Vitals Group     BP 12/01/20 1340 (!) 165/114     Pulse Rate 12/01/20 1340 90     Resp 12/01/20 1340 19     Temp 12/01/20 1340 98.1 F (36.7 C)     Temp Source 12/01/20 1340 Oral     SpO2 12/01/20 1340 97 %     Weight 12/01/20 1342 170 lb (77.1 kg)     Height 12/01/20 1342 '5\' 2"'  (1.575 m)     Head Circumference --      Peak Flow --      Pain Score 12/01/20 1340 3     Pain Loc --      Pain Edu? --      Excl. in [place]? --    No data found.  Updated Vital Signs BP (!) 165/114 (BP Location: Right Arm)   Pulse 90   Temp 98.1 F (36.7 C) (Oral)   Resp 19   Ht '5\' 2"'  (1.575 m)   Wt 170 lb  (77.1 kg)   SpO2 97%   BMI 31.09 kg/m       Physical Exam Vitals and nursing note reviewed.  Constitutional:      General: She is not in acute distress.    Appearance: Normal appearance. She is not ill-appearing or toxic-appearing.  HENT:     Head: Normocephalic and atraumatic.     Right Ear: Tympanic membrane, ear canal and external ear normal.     Left Ear: Tympanic membrane, ear canal and external ear normal.     Nose: Nose normal.     Mouth/Throat:     Mouth: Mucous membranes are moist.     Pharynx: Oropharynx is clear.  Eyes:     General: No scleral icterus.       Right eye: No discharge.        Left eye: No discharge.     Extraocular Movements: Extraocular movements intact.     Conjunctiva/sclera: Conjunctivae normal.     Pupils: Pupils are equal, round, and reactive to light.  Cardiovascular:     Rate and Rhythm: Normal rate and regular rhythm.     Heart sounds: Normal heart sounds.  Pulmonary:     Effort: Pulmonary effort is normal. No respiratory distress.     Breath sounds: Normal breath sounds.  Musculoskeletal:     Cervical back: Normal range of motion and neck supple. Tenderness (mild right paracervical TTP) present. No rigidity.  Skin:    General: Skin is dry.  Neurological:     General: No focal deficit present.     Mental Status: She is alert and oriented to person, place, and time. Mental status is at baseline.     Cranial Nerves: No cranial nerve deficit, dysarthria or facial asymmetry.     Sensory: No sensory deficit.     Motor: No weakness.     Coordination: Romberg sign negative. Finger-Nose-Finger Test abnormal (only with right hand). Heel to [facility] Test normal.  Gait: Gait normal.     Comments: 5/5 strength bilateral but very minimal weakness noted upper and lower right extremities compared to opposite  Psychiatric:        Mood and Affect: Mood normal.        Behavior: Behavior normal.        Thought Content: Thought content normal.       UC Treatments / Results  Labs (all labs ordered are listed, but only abnormal results are displayed) Labs Reviewed - No data to display  EKG   Radiology No results found.  Procedures Procedures (including critical care time)  Medications Ordered in UC Medications - No data to display  Initial Impression / Assessment and Plan / UC Course  I have reviewed the triage vital signs and the nursing notes.  Pertinent labs & imaging results that were available during my care of the patient were reviewed by me and considered in my medical decision making (see chart for details).   60 year old female presenting for right-sided weakness and problems with coordination since yesterday.  She had some symptoms yesterday that have improved which included blurred vision, left-sided weakness and right-sided jaw numbness.  Blood pressure elevated at 165/114.  Other vital signs normal and stable.  Exam significant for very minor weakness of the right upper and lower extremities when compared to the left as well as abnormal finger-to-nose testing on the right side.  Based on patient's symptoms there is concern for possible TIA versus stroke and advised patient that she needs further work-up including a CT scan for monitoring.  Advised that we cannot have CT scan here in the urgent care today and I suggest that she be evaluated in the ED.  Agrees but says no one can drive her and she does not want EMS to be called.  She plans to take herself and has signed an AMA form.  Patient leaving in stable condition.    Final Clinical Impressions(s) / UC Diagnoses   Final diagnoses:  Right sided weakness  Coordination abnormal  Dysgraphia     Discharge Instructions     You have been advised to follow up immediately in the emergency department for concerning signs.symptoms. If you declined EMS transport, please have a family member take you directly to the ED at this time. Do not delay. Based on  concerns about condition, if you do not follow up in th e ED, you may risk poor outcomes including worsening of condition, delayed treatment and potentially life threatening issues. If you have declined to go to the ED at this time, you should call your PCP immediately to set up a follow up appointment.  Go to ED for red flag symptoms, including; fevers you cannot reduce with Tylenol/Motrin, severe headaches, vision changes, numbness/weakness in part of the body, lethargy, confusion, intractable vomiting, severe dehydration, chest pain, breathing difficulty, severe persistent abdominal or pelvic pain, signs of severe infection (increased redness, swelling of an area), feeling faint or passing out, dizziness, etc. You should especially go to the ED for sudden acute worsening of condition if you do not elect to go at this time.     ED Prescriptions    None     PDMP not reviewed this encounter.   Danton Clap, PA-C 12/01/20 [phone]

## 2020-12-01 NOTE — ED Provider Notes (Signed)
Medical screening examination/treatment/procedure(s) were conducted as a shared visit with non-physician practitioner(s) and myself.  I personally evaluated the patient during the encounter.    Patient currently resting comfortably.  Understand agreeable with plan to obtain MRI, which would be utilized to evaluate for possible ischemic stroke or aneurysm or other central neurologic abnormality.  If this is reassuring, would likely plan to discharge to follow-up outpatient.  Patient understand agreeable  She is fully awake and alert.  At this point on exam I do not see any obvious central neurologic abnormalities.  She reports what seems to be a slightly intermittent dysmetria.  Resting comfortably at this time.  Very pleasant stable condition   Delman Kitten, MD 12/01/20 2113

## 2020-12-01 NOTE — ED Notes (Addendum)
Pt reports symptoms that began yesterday morning.

## 2020-12-01 NOTE — ED Provider Notes (Signed)
-----------------------------------------   11:08 PM on 12/01/2020 -----------------------------------------  Assuming care from Dr. Jacqualine Code.  In short, Sherry Oliver is a 60 y.o. female with a chief complaint of weakness on the right side of her body with some coordination issues.  Refer to the original H&P for additional details.  The current plan of care is to follow up MRI.     ----------------------------------------- 12:35 AM on 12/02/2020 -----------------------------------------  Unfortunately the MRI demonstrates a small thalamic acute infarction.  I talked with the patient and reassessed.  She continues to have some dysmetria with the right (dominant) arm and she said that when she ambulates she feels a bit unsteady.  She has no dysarthria or aphasia and no facial droop at this time.  Symptoms have improved since the original onset which she confirmed to be almost 2 days ago (somewhere around 36 hours).   She does not meet criteria for TPA based on the last known normal.  I have ordered a full dose aspirin, "code stroke fluids" including 500 mL normal saline bolus and an infusion rate of 100 mL/h.  Lab work has already been completed.  She has been eating without difficulty and passed the stroke swallow screen.  Blood pressure is elevated at 164/87 but does not require intervention at this time (permissive hypertension).  I consulted the hospitalist for admission.   Hinda Kehr, MD 12/02/20 [phone]

## 2020-12-01 NOTE — ED Triage Notes (Signed)
Patient is being discharged from the Urgent Care and sent to the Emergency Department via POV . Per Christene Slates, PA, patient is in need of higher level of care due to symptoms. Patient is aware and verbalizes understanding of plan of care. Patient was advised for someone to drive her to the ED. Patient declined and signed out AMA. Vitals:   12/01/20 1340  BP: (!) 165/114  Pulse: 90  Resp: 19  Temp: 98.1 F (36.7 C)  SpO2: 97%

## 2020-12-01 NOTE — Discharge Instructions (Signed)

## 2020-12-02 ENCOUNTER — Observation Stay
Admit: 2020-12-02 | Discharge: 2020-12-02 | Disposition: A | Discharge status: Home / Self Care | Payer: BC Managed Care – PPO | Attending: Internal Medicine | Admitting: Internal Medicine

## 2020-12-02 ENCOUNTER — Encounter: Payer: Self-pay | Admitting: Internal Medicine

## 2020-12-02 ENCOUNTER — Observation Stay: Payer: BC Managed Care – PPO

## 2020-12-02 DIAGNOSIS — Z87891 Personal history of nicotine dependence: Secondary | ICD-10-CM | POA: Diagnosis not present

## 2020-12-02 DIAGNOSIS — I639 Cerebral infarction, unspecified: Secondary | ICD-10-CM

## 2020-12-02 DIAGNOSIS — G4733 Obstructive sleep apnea (adult) (pediatric): Secondary | ICD-10-CM | POA: Diagnosis not present

## 2020-12-02 DIAGNOSIS — I7789 Other specified disorders of arteries and arterioles: Secondary | ICD-10-CM | POA: Diagnosis not present

## 2020-12-02 DIAGNOSIS — E782 Mixed hyperlipidemia: Secondary | ICD-10-CM

## 2020-12-02 DIAGNOSIS — R7303 Prediabetes: Secondary | ICD-10-CM

## 2020-12-02 DIAGNOSIS — I10 Essential (primary) hypertension: Secondary | ICD-10-CM | POA: Diagnosis not present

## 2020-12-02 DIAGNOSIS — Z9989 Dependence on other enabling machines and devices: Secondary | ICD-10-CM

## 2020-12-02 DIAGNOSIS — I6381 Other cerebral infarction due to occlusion or stenosis of small artery: Secondary | ICD-10-CM

## 2020-12-02 DIAGNOSIS — I63233 Cerebral infarction due to unspecified occlusion or stenosis of bilateral carotid arteries: Secondary | ICD-10-CM | POA: Diagnosis not present

## 2020-12-02 DIAGNOSIS — L209 Atopic dermatitis, unspecified: Secondary | ICD-10-CM

## 2020-12-02 DIAGNOSIS — I6389 Other cerebral infarction: Secondary | ICD-10-CM | POA: Diagnosis not present

## 2020-12-02 LAB — ECHOCARDIOGRAM COMPLETE
AR max vel: 1.44 cm2
AV Area VTI: 1.51 cm2
AV Area mean vel: 1.43 cm2
AV Mean grad: 6 mmHg
AV Peak grad: 10.4 mmHg
Ao pk vel: 1.61 m/s
Area-P 1/2: 6.46 cm2
Height: 62 in
MV VTI: 3.12 cm2
S' Lateral: 2.9 cm
Weight: 2720 oz

## 2020-12-02 LAB — HIV ANTIBODY (ROUTINE TESTING W REFLEX): HIV Screen 4th Generation wRfx: NONREACTIVE

## 2020-12-02 LAB — CBC
HCT: 40.1 % (ref 36.0–46.0)
Hemoglobin: 13 g/dL (ref 12.0–15.0)
MCH: 30 pg (ref 26.0–34.0)
MCHC: 32.4 g/dL (ref 30.0–36.0)
MCV: 92.6 fL (ref 80.0–100.0)
Platelets: 430 10*3/uL — ABNORMAL HIGH (ref 150–400)
RBC: 4.33 MIL/uL (ref 3.87–5.11)
RDW: 13.5 % (ref 11.5–15.5)
WBC: 10 10*3/uL (ref 4.0–10.5)
nRBC: 0 % (ref 0.0–0.2)

## 2020-12-02 LAB — URINE DRUG SCREEN, QUALITATIVE (ARMC ONLY)
Amphetamines, Ur Screen: NOT DETECTED
Barbiturates, Ur Screen: NOT DETECTED
Benzodiazepine, Ur Scrn: NOT DETECTED
Cannabinoid 50 Ng, Ur ~~LOC~~: NOT DETECTED
Cocaine Metabolite,Ur ~~LOC~~: NOT DETECTED
MDMA (Ecstasy)Ur Screen: NOT DETECTED
Methadone Scn, Ur: NOT DETECTED
Opiate, Ur Screen: NOT DETECTED
Phencyclidine (PCP) Ur S: NOT DETECTED
Tricyclic, Ur Screen: NOT DETECTED

## 2020-12-02 LAB — CREATININE, SERUM
Creatinine, Ser: 0.75 mg/dL (ref 0.44–1.00)
GFR, Estimated: >60 mL/min (ref >60)

## 2020-12-02 LAB — LIPID PANEL
Cholesterol: 277 mg/dL — ABNORMAL HIGH (ref 0–200)
HDL: 38 mg/dL — ABNORMAL LOW (ref >40)
LDL Cholesterol: 173 mg/dL — ABNORMAL HIGH (ref 0–99)
Total CHOL/HDL Ratio: 7.3 RATIO
Triglycerides: 328 mg/dL — ABNORMAL HIGH (ref <150)
VLDL: 66 mg/dL — ABNORMAL HIGH (ref 0–40)

## 2020-12-02 LAB — RESP PANEL BY RT-PCR (FLU A&B, COVID) ARPGX2
Influenza A by PCR: NEGATIVE
Influenza B by PCR: NEGATIVE
SARS Coronavirus 2 by RT PCR: NEGATIVE

## 2020-12-02 LAB — HEMOGLOBIN A1C
Hgb A1c MFr Bld: 6.2 % — ABNORMAL HIGH (ref 4.8–5.6)
Mean Plasma Glucose: 131.24 mg/dL

## 2020-12-02 IMAGING — US US CAROTID DUPLEX BILAT
1 series · 13 of 24 positions shown · non-contrast
Comparison: Prior MRI from [DATE].

CLINICAL DATA: Initial evaluation for acute stroke.

EXAM:
BILATERAL CAROTID DUPLEX ULTRASOUND
TECHNIQUE: Gray scale imaging, color Doppler and duplex ultrasound were
performed of bilateral carotid and vertebral arteries in the neck.

[Series 1: us carotid bilateral · 13 of 68 slices shown]
[im 1/68]
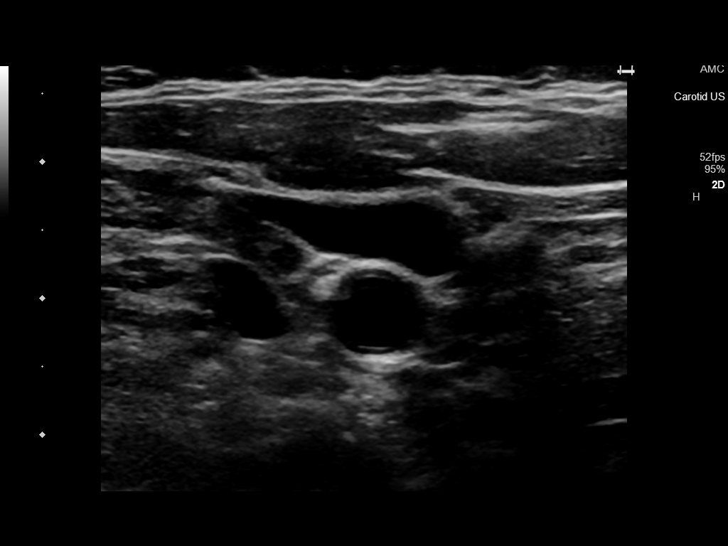
[im 6/68]
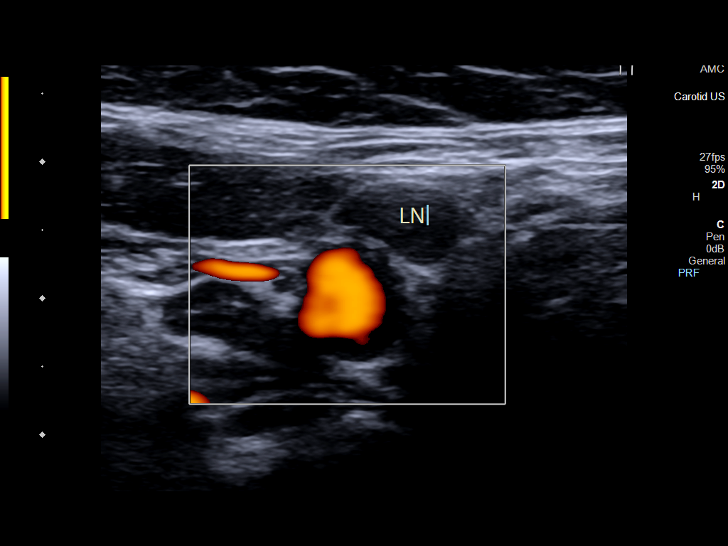
[im 12/68]
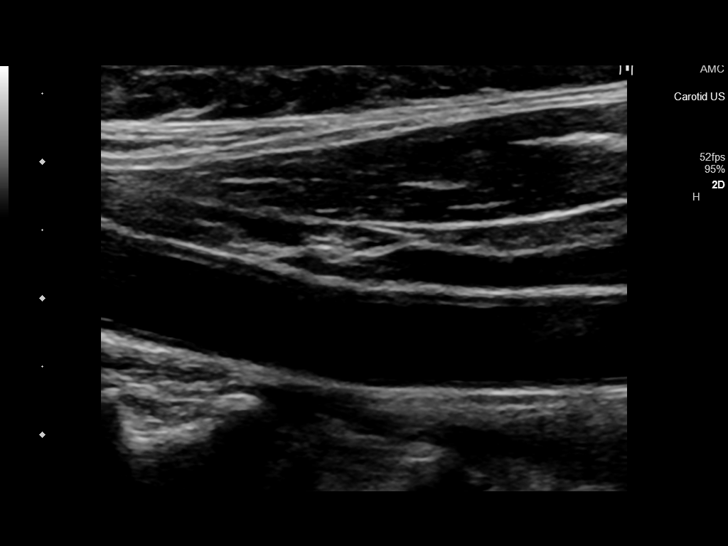
[im 18/68]
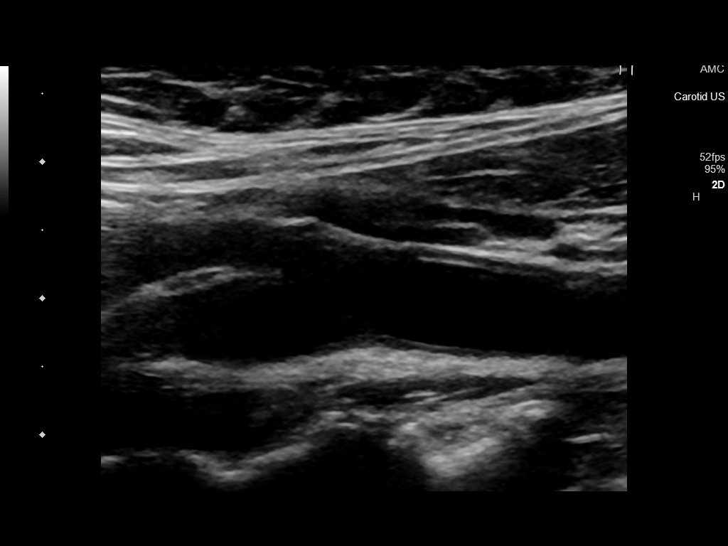
[im 24/68]
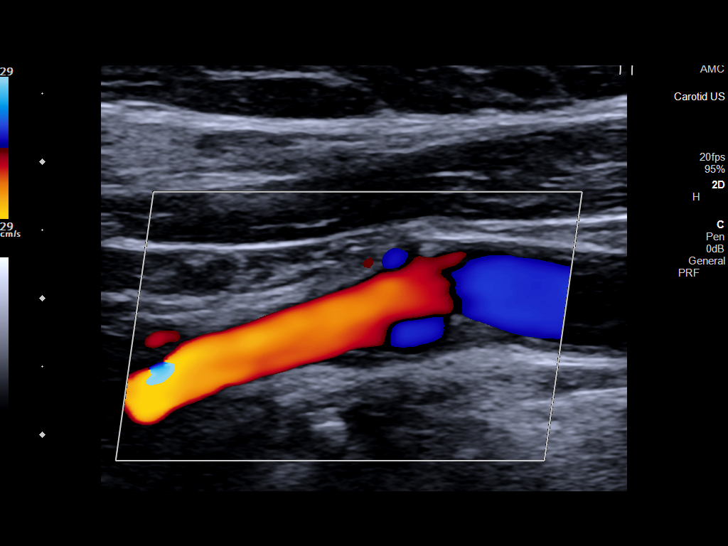
[im 30/68]
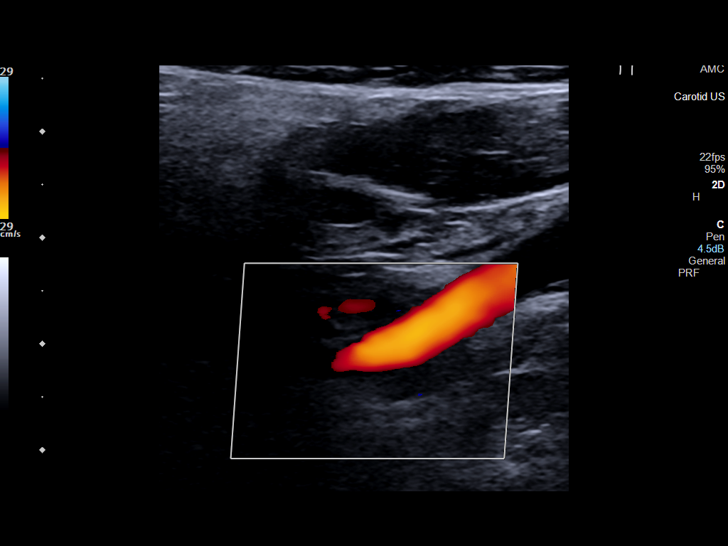
[im 35/68]
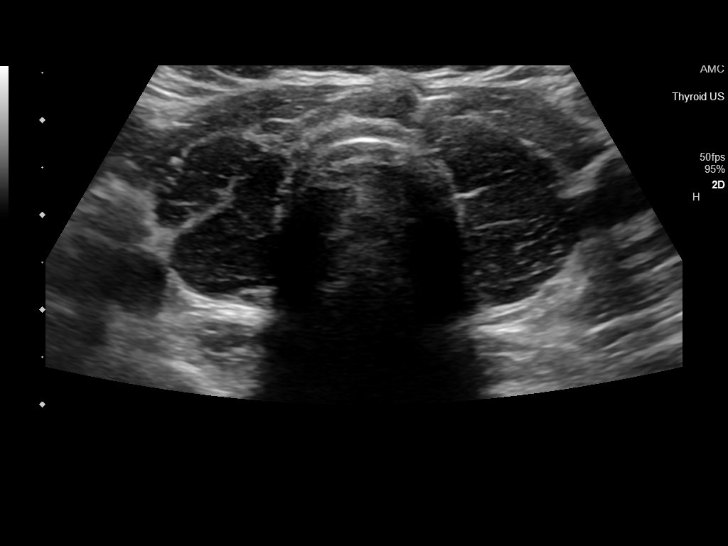
[im 38/68]
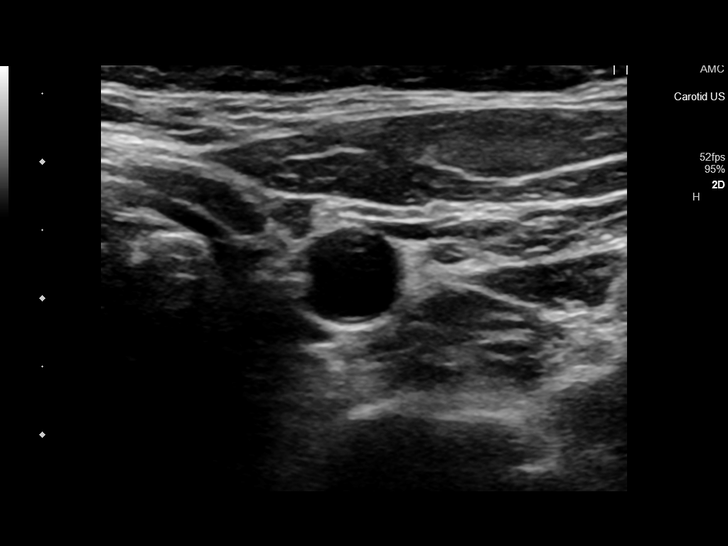
[im 44/68]
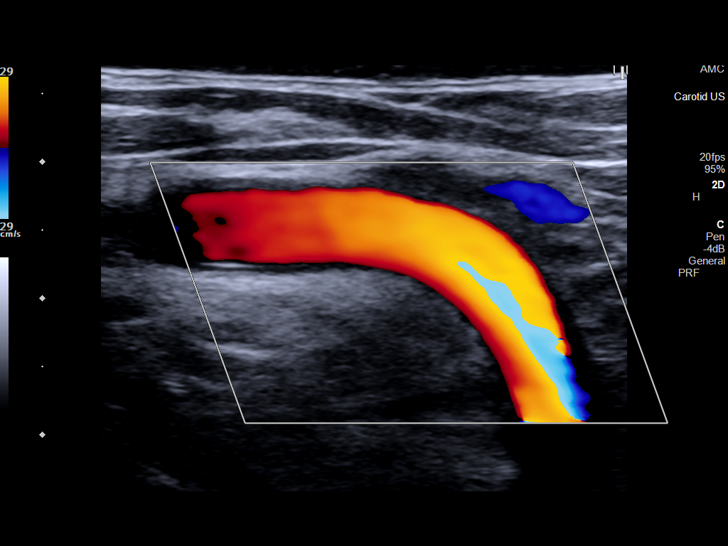
[im 50/68]
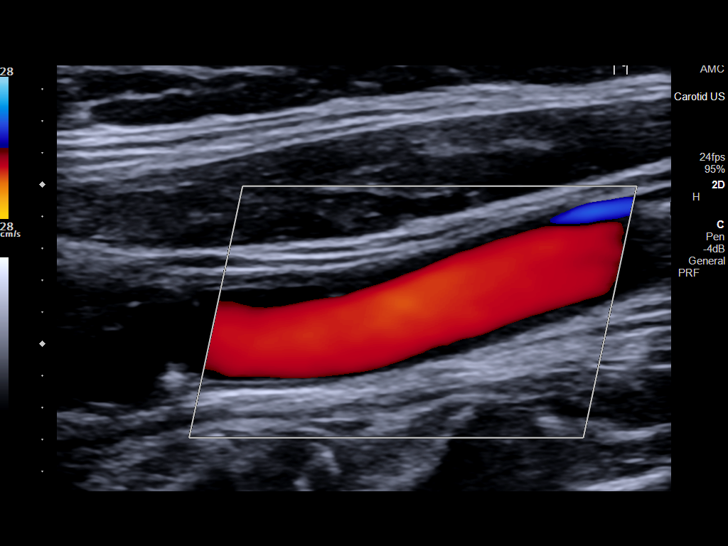
[im 56/68]
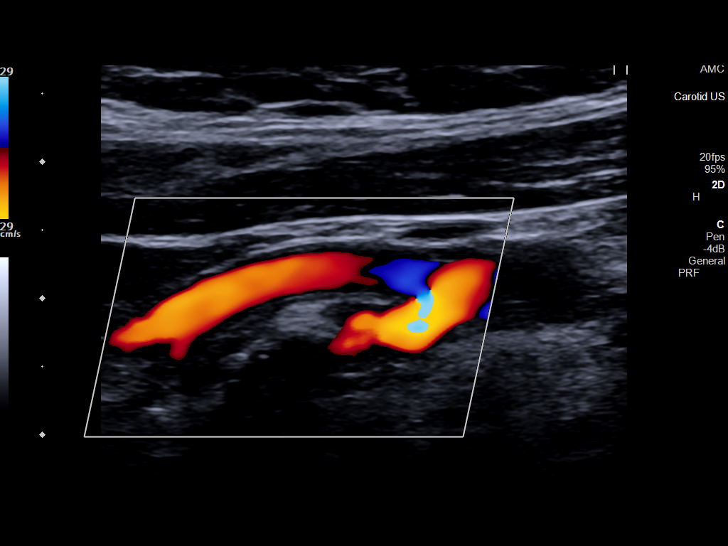
[im 62/68]
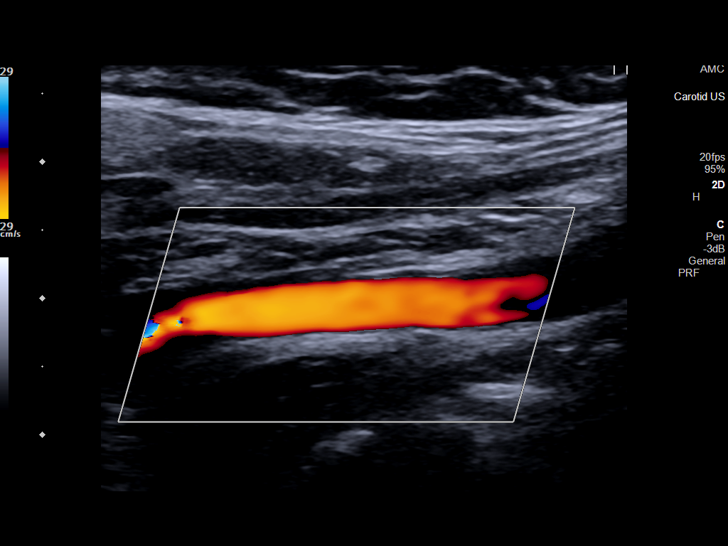
[im 68/68]
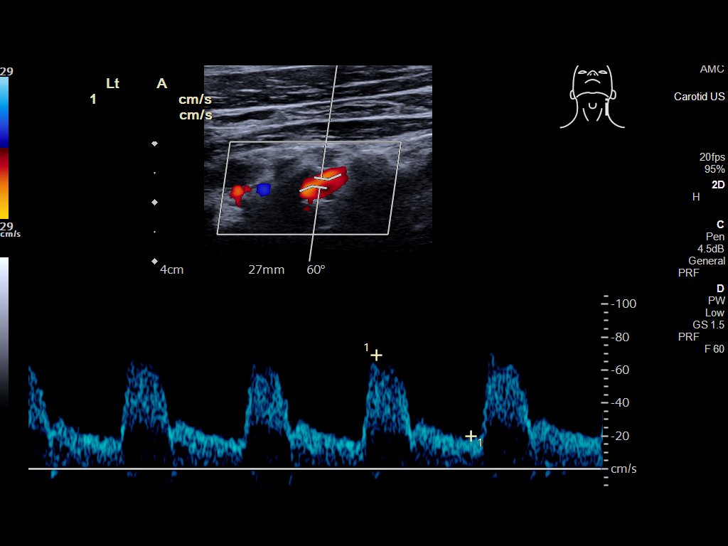

[13 of 24 positions shown; findings below may reference images not displayed]

FINDINGS: Criteria: Quantification of carotid stenosis is based on velocity
parameters that correlate the residual internal carotid diameter
with NASCET-based stenosis levels, using the diameter of the distal
internal carotid lumen as the denominator for stenosis measurement.

The following velocity measurements were obtained:

RIGHT

ICA: 96/37 cm/sec

CCA: 62/19 cm/sec

SYSTOLIC ICA/CCA RATIO:

ECA: 107 cm/sec

LEFT

ICA: 100/35 cm/sec

CCA: 78/23 cm/sec

SYSTOLIC ICA/CCA RATIO:

ECA: 81 cm/sec

RIGHT CAROTID ARTERY: Mild intimal thickening within the right CCA
without hemodynamically significant stenosis. Intimal thickening
extends to involve the right carotid bulb. Heterogeneous echogenic
plaque seen at the proximal right ICA, but with no elevation in peak
systolic velocity to suggest hemodynamically significant stenosis.
Right ICA widely patent distally.

RIGHT VERTEBRAL ARTERY:  Antegrade.

LEFT CAROTID ARTERY: Diffuse intimal thickening seen throughout the
left CCA, but with no hemodynamically significant stenosis.
Heterogeneous echogenic plaque seen within the left carotid bulb,
extending into the proximal left ICA. No significant elevation in
peak systolic velocity to suggest hemodynamically significant
stenosis. Visualized portions of the left ICA widely patent
distally.

LEFT VERTEBRAL ARTERY:  Antegrade.
IMPRESSION: 1. Mild echogenic plaque about the carotid bulbs and proximal
internal carotid arteries, left greater than right, but with no
hemodynamically significant stenosis by carotid Doppler ultrasound.
2. Antegrade flow within both vertebral arteries within the neck.

## 2020-12-02 MED ORDER — CLOPIDOGREL BISULFATE 75 MG PO TABS: 75.0000 mg | ORAL_TABLET | Freq: Every day | ORAL | Status: DC

## 2020-12-02 MED ORDER — CLOPIDOGREL BISULFATE 75 MG PO TABS: 75.0000 mg | ORAL_TABLET | Freq: Every day | ORAL | 0 refills | Status: DC

## 2020-12-02 MED ORDER — ACETAMINOPHEN 160 MG/5ML PO SOLN
650.0000 mg | ORAL | Status: DC | PRN
  Filled 2020-12-02: qty 20.3

## 2020-12-02 MED ORDER — SODIUM CHLORIDE 0.9 % IV SOLN
100.0000 mL/h | INTRAVENOUS | Status: DC
  Administered 2020-12-02: 100 mL/h via INTRAVENOUS

## 2020-12-02 MED ORDER — ACETAMINOPHEN 325 MG PO TABS: 650.0000 mg | ORAL_TABLET | ORAL | Status: DC | PRN

## 2020-12-02 MED ORDER — ASPIRIN 81 MG PO CHEW: 81.0000 mg | CHEWABLE_TABLET | Freq: Every day | ORAL | 1 refills | Status: AC

## 2020-12-02 MED ORDER — LEVOTHYROXINE SODIUM 88 MCG PO TABS
88.0000 ug | ORAL_TABLET | Freq: Every day | ORAL | Status: DC
  Administered 2020-12-02: 88 ug via ORAL
  Filled 2020-12-02 (×2): qty 1

## 2020-12-02 MED ORDER — PRAMIPEXOLE DIHYDROCHLORIDE 0.25 MG PO TABS
0.5000 mg | ORAL_TABLET | Freq: Every day | ORAL | Status: DC
Start: 2020-12-02 — End: 2020-12-02
  Administered 2020-12-02: 0.5 mg via ORAL
  Filled 2020-12-02 (×2): qty 2

## 2020-12-02 MED ORDER — ASPIRIN 81 MG PO CHEW
324.0000 mg | CHEWABLE_TABLET | Freq: Once | ORAL | Status: AC
  Administered 2020-12-02: 324 mg via ORAL
  Filled 2020-12-02: qty 4

## 2020-12-02 MED ORDER — CLOPIDOGREL BISULFATE 75 MG PO TABS: 300.0000 mg | ORAL_TABLET | Freq: Once | ORAL | Status: DC

## 2020-12-02 MED ORDER — LEVOTHYROXINE SODIUM 88 MCG PO TABS: 88.0000 ug | ORAL_TABLET | Freq: Every day | ORAL | Status: DC

## 2020-12-02 MED ORDER — STROKE: EARLY STAGES OF RECOVERY BOOK: Freq: Once | Status: DC

## 2020-12-02 MED ORDER — PANTOPRAZOLE SODIUM 40 MG PO TBEC
40.0000 mg | DELAYED_RELEASE_TABLET | Freq: Every day | ORAL | Status: DC
  Administered 2020-12-02: 40 mg via ORAL
  Filled 2020-12-02: qty 1

## 2020-12-02 MED ORDER — ASPIRIN 81 MG PO CHEW: 81.0000 mg | CHEWABLE_TABLET | Freq: Every day | ORAL | Status: DC

## 2020-12-02 MED ORDER — LIOTHYRONINE SODIUM 5 MCG PO TABS
5.0000 ug | ORAL_TABLET | Freq: Every day | ORAL | Status: DC
  Administered 2020-12-02: 5 ug via ORAL
  Filled 2020-12-02: qty 1

## 2020-12-02 MED ORDER — ACETAMINOPHEN 650 MG RE SUPP: 650.0000 mg | RECTAL | Status: DC | PRN

## 2020-12-02 MED ORDER — SODIUM CHLORIDE 0.9 % IV BOLUS
500.0000 mL | Freq: Once | INTRAVENOUS | Status: AC
  Administered 2020-12-02: 500 mL via INTRAVENOUS

## 2020-12-02 MED ORDER — ENOXAPARIN SODIUM 40 MG/0.4ML ~~LOC~~ SOLN
40.0000 mg | SUBCUTANEOUS | Status: DC
  Administered 2020-12-02: 40 mg via SUBCUTANEOUS
  Filled 2020-12-02: qty 0.4

## 2020-12-02 NOTE — Evaluation (Addendum)
Physical Therapy Evaluation Patient Details Name: Sherry Oliver MRN: [id number] DOB: 1961-01-20 Today's Date: 12/02/2020   History of Present Illness  Pt admitted for presumed acute CVA. + MRI for acute L thalamic infarct. Pt with complaints of blurry vision and R side numbness. History includes HTN, GERD, and tobacco abuse.  Clinical Impression  Pt is a pleasant 60 year old female who was admitted for acute CVA. Pt performs transfers with mod I and ambulation with supervision and no AD. Pt demonstrates deficits with coordination/balance. Decreased speed in R hand with RAMP testing and reports feeling uncoordinated. No sensation deficits noted at this time. Would benefit from skilled PT to address above deficits and promote optimal return to PLOF. Currently recommend OP PT for further follow up.    Follow Up Recommendations Outpatient PT    Equipment Recommendations  None recommended by PT    Recommendations for Other Services       Precautions / Restrictions Restrictions Weight Bearing Restrictions: No      Mobility  Bed Mobility               General bed mobility comments: not performed as received seated at bedside.    Transfers Overall transfer level: Modified independent Equipment used: None             General transfer comment: safe technique, pushes from bed. Once standing, upright posture noted  Ambulation/Gait Ambulation/Gait assistance: Supervision Gait Distance (Feet): 200 Feet Assistive device: None Gait Pattern/deviations: Step-through pattern     General Gait Details: ambulated with safe technique, however does have small LOB with mod. DGI testing. Able to self recover. Good speed  Stairs            Wheelchair Mobility    Modified Rankin (Stroke Patients Only)       Balance Overall balance assessment: Needs assistance Sitting-balance support: Feet supported;No upper extremity supported Sitting balance-Leahy Scale: Good      Standing balance support: No upper extremity supported;During functional activity Standing balance-Leahy Scale: Fair                   Standardized Balance Assessment Standardized Balance Assessment : Dynamic Gait Index   Dynamic Gait Index Level Surface: Normal Change in Gait Speed: Normal Gait with Horizontal Head Turns: Mild Impairment Gait with Vertical Head Turns: Mild Impairment Gait and Pivot Turn: Mild Impairment       Pertinent Vitals/Pain Pain Assessment: No/denies pain    Home Living Family/patient expects to be discharged to:: Private residence Living Arrangements: Parent (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) Available Help at Discharge: Family Type of Home: House Home Access: Stairs to enter Entrance Stairs-Rails: Can reach both Entrance Stairs-Number of Steps: 4 Home Layout: Two level Home Equipment: None      Prior Function Level of Independence: Independent         Comments: reports very active, driving and taking care of elderly mother     Hand Dominance   Dominant Hand: Right    Extremity/Trunk Assessment   Upper Extremity Assessment Upper Extremity Assessment: Overall WFL for tasks assessed    Lower Extremity Assessment Lower Extremity Assessment: Overall WFL for tasks assessed       Communication   Communication: No difficulties  Cognition Arousal/Alertness: Awake/alert Behavior During Therapy: WFL for tasks assessed/performed Overall Cognitive Status: Within Functional Limits for tasks assessed  General Comments      Exercises Other Exercises Other Exercises: ambulated to sink and able to brush teeth. Good sequencing and no LOB with position change, supervision for safety   Assessment/Plan    PT Assessment Patient needs continued PT services  PT Problem List Decreased balance;Decreased coordination       PT Treatment Interventions Gait training;Balance training     PT Goals (Current goals can be found in the Care Plan section)  Acute Rehab PT Goals Patient Stated Goal: to go home PT Goal Formulation: With patient Time For Goal Achievement: 12/16/20 Potential to Achieve Goals: Good    Frequency Min 2X/week   Barriers to discharge        Co-evaluation               AM-PAC PT "6 Clicks" Mobility  Outcome Measure Help needed turning from your back to your side while in a flat bed without using bedrails?: None Help needed moving from lying on your back to sitting on the side of a flat bed without using bedrails?: None Help needed moving to and from a bed to a chair (including a wheelchair)?: None Help needed standing up from a chair using your arms (e.g., wheelchair or bedside chair)?: None Help needed to walk in hospital room?: None Help needed climbing 3-5 steps with a railing? : A Little 6 Click Score: 23    End of Session Equipment Utilized During Treatment: Gait belt Activity Tolerance: Patient tolerated treatment well Patient left: in bed Nurse Communication: Mobility status PT Visit Diagnosis: Unsteadiness on feet (R26.81)    Time: 1003-1020 PT Time Calculation (min) (ACUTE ONLY): 17 min   Charges:   PT Evaluation $PT Eval Low Complexity: 1 Low PT Treatments $Therapeutic Activity: 8-22 mins        Greggory Stallion, PT, DPT [phone]   Mckynleigh Mussell 12/02/2020, 12:24 PM

## 2020-12-02 NOTE — ED Notes (Signed)
Pt ambulatory to restroom

## 2020-12-02 NOTE — ED Notes (Signed)
Provided discharge instructions. Patient verbalized understanding. Alert and ambulatory upon discharge. Home with mother.

## 2020-12-02 NOTE — ED Notes (Signed)
PO fluids and graham crackers/peanut butter given per MD request.

## 2020-12-02 NOTE — ED Notes (Signed)
Dr. Duncan at bedside 

## 2020-12-02 NOTE — Consult Note (Signed)
Neurology Consultation Reason for Consult: Stroke Requesting Physician: Judd Gaudier  CC: Right arm and leg incoordination and mild weakness  History is obtained from: Patient, chart review and   HPI: Sherry Oliver is a 60 y.o. 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  When she woke for the day at 7:30 AM on Saturday she had some difficulty controlling her right arm movements as well as her right leg movements.  She initially chalked this up to her chronic degenerative disc disease issues with her spine and was able to perform normal activities such as grocery shopping that day.  However she additionally developed some right jaw numbness on Saturday night.  Sunday when her symptoms were persistent (though the numbness did improve), she presented to urgent care who advised her to go to the ED for further evaluation.  Regarding her noted statin allergy she reports that she has tried several different statin medications, for up to 2 months at a time, and always has severe leg pain with these.  Additionally her brother was told to stop taking statin medication after he had similar symptoms and blood testing revealed muscle damage.  Her PCP has discussed starting an injectable medication such as Repatha with her.  Regarding her atopic dermatitis, this is quite severe and she started Dupixent back in June 2021.  This has made a marked improvement in her symptoms.  She reports she is compliant with her CPAP and that her blood pressure is not well controlled, typically in the 150s over 90s these  days.  On review of systems she additionally reports chronic right neck pain, a severe headache on Sunday once she was told she needed to go to the ED that has resolved and that she attributes to stress/elevated blood pressure in the setting of the stress, chronic shortness of breath typically when she bends, mild chronic sinus congestion, frequent nosebleeds that were worse when she was taking Advil.  LKW: 3/19 at 1 AM  tPA given?: No, due to out of the window Premorbid modified rankin scale:      0 - No symptoms.  ROS: All other review of systems was negative except as noted in the HPI.   Past Medical History:  Diagnosis Date  . Allergy   . Gastropathy   . GERD (gastroesophageal reflux disease)   . Headache   . Hyperlipidemia   . Hypertension   . Hypothyroid   . Lumbar radiculopathy   . Lung nodule   . Sleep apnea    Past Surgical History:  Procedure Laterality Date  . APPENDECTOMY  2008  . BLADDER SURGERY  1965   bladder stem  . BREAST BIOPSY Left 06/06/2019   Lymph node US Bx, hydromarker. pending path   . COLONOSCOPY WITH PROPOFOL N/A 02/19/2015   Procedure: COLONOSCOPY WITH PROPOFOL;  Surgeon: Lollie Sails, MD;  Location: [facility] ENDOSCOPY;  Service: Endoscopy;  Laterality: N/A;  . COLONOSCOPY WITH PROPOFOL N/A 03/24/2018   Procedure: COLONOSCOPY WITH PROPOFOL;  Surgeon: Lollie Sails, MD;  Location: [facility] ENDOSCOPY;  Service: Endoscopy;  Laterality: N/A;  . ELBOW SURGERY Right 1982  .  ESOPHAGOGASTRODUODENOSCOPY    . FOOT SURGERY Left 2007   neuroma removal, repeat 2008    Current Outpatient Medications  Medication Instructions  . acetaminophen (TYLENOL) 500 MG tablet Oral  . b complex vitamins tablet 1 tablet, Oral, Daily  . Calcium Carbonate-Vitamin D 600-400 MG-UNIT per tablet 1 tablet, Oral, Daily  . clobetasol ointment (TEMOVATE) 9.24 % 1 application, Topical, 2 times daily PRN  . desoximetasone (TOPICORT) 0.25 % cream Apply to affected areas twice daily.  Avoid applying to face, groin, and axilla. Use as directed.  . diclofenac Sodium (VOLTAREN) 2 g, Topical, 3 times daily PRN  . DUPIXENT 300 MG/2ML SOPN INJECT 300 MG (1 PEN) UNDER THE SKIN EVERY 2 WEEKS  . esomeprazole (NEXIUM) 20 mg, Oral, 2 times daily  . fluticasone (FLONASE) 50 MCG/ACT nasal spray 2 sprays, Each Nare, Daily  . Fluticasone-Umeclidin-Vilant (TRELEGY ELLIPTA) 100-62.5-25 MCG/INH AEPB Inhalation  . ibuprofen (ADVIL) 400 mg, Oral, 2 times daily  . levothyroxine (SYNTHROID) 88 mcg, Oral, Daily before breakfast  . liothyronine (CYTOMEL) 5 mcg, Oral, Daily  . montelukast (SINGULAIR) 10 mg, Oral, Daily  . Multiple Vitamin (MULTIVITAMIN WITH MINERALS) TABS tablet 1 tablet, Oral, Daily  . omeprazole (PRILOSEC) 40 MG capsule Oral  . pramipexole (MIRAPEX) 0.375 mg, Oral, Nightly, Quarter tab nightly<BR>  . tacrolimus (PROTOPIC) 0.1 % ointment APPLY TO AFFECTED AREA TWICE A DAY AS DIRECTED  . vitamin C 1,000 mg, Oral, Daily    Family History  Problem Relation Age of Onset  . Breast cancer Mother 31  . BRCA 1/2 Mother        positive for Brca 2  . Cancer Mother        bladder  . Breast cancer Maternal Aunt 3  . Breast cancer Maternal Grandfather        70's  . Alcohol abuse Father   . Lung cancer Father 60       mets to brain  . Heart disease Maternal Grandmother   . Stroke Paternal Grandmother     Social History:  reports that she quit smoking about 2 years ago. Her smoking use included cigarettes. She has quit using smokeless tobacco. She reports current alcohol use. She reports previous drug use.   Exam: Current vital signs: BP 126/71   Pulse 74   Temp 98.5 F (36.9 C) (Oral)   Resp 16   Ht '5\' 2"'  (1.575 m)   Wt 77.1 kg   SpO2 96%   BMI 31.09 kg/m  Vital signs in last 24 hours: Temp:  [98.1 F (36.7 C)-98.5 F (36.9 C)] 98.5 F (36.9 C) (03/20 1638) Pulse Rate:  [74-104] 74 (03/21 0630) Resp:  [12-25] 16 (03/21 0630) BP: (126-179)/(71-115) 126/71  (03/21 0630) SpO2:  [94 %-99 %] 96 % (03/21 0630) Weight:  [77.1 kg] 77.1 kg (03/20 1638)   Physical Exam  Constitutional: Appears well-developed and well-nourished.  Psych: Affect appropriate to situation Eyes: No scleral injection HENT: No oropharyngeal obstruction.  MSK: no joint deformities.  Cardiovascular: Normal rate and regular rhythm.  Respiratory: Effort normal, non-labored breathing GI: Soft.  No distension. There is no tenderness.  Skin: Warm dry and intact visible skin, there are some eczema related skin changes of her hands and her nails which she reports are markedly improved from prior to [place]  Neuro: Mental Status: Patient is awake, alert, oriented to person, place, month, year, and situation. Patient is able to give a clear and coherent history. No signs of aphasia or neglect Cranial  Nerves: II: Visual Fields are full. Pupils are equal, round, and reactive to light.   III,IV, VI: EOMI without ptosis or diploplia.  V: Facial sensation is symmetric to temperature VII: Facial movement is symmetric.  VIII: hearing is intact to voice X: Uvula elevates symmetrically XI: Shoulder shrug is symmetric. XII: tongue is midline without atrophy or fasciculations.  Motor: Tone is normal. Bulk is normal. 5/5 strength was present in all four extremities, other than mild right hip weakness 4/5 Sensory: Sensation is symmetric to light touch and temperature in the arms and legs.  There is no length dependent loss of temperature Deep Tendon Reflexes: 2+ and symmetric in the biceps and patellae.  Plantars: Toes are mute bilaterally.  Cerebellar: FNF and HKS are intact bilaterally  NIHSS total 0   I have reviewed labs in epic and the results pertinent to this consultation are:  Utox and UA negative. CMP and CBC notable only for mild hyperglycemia (110) and platelets increased from 200s on admission to 430 on repeat  Lab Results  Component Value Date   CHOL 277 (H)  12/02/2020   HDL 38 (L) 12/02/2020   LDLCALC 173 (H) 12/02/2020   TRIG 328 (H) 12/02/2020   CHOLHDL 7.3 12/02/2020   Lab Results  Component Value Date   HGBA1C 6.2 (H) 12/02/2020  Increased from 5.9 on 04/15/20  I have personally reviewed the CNS images obtained:  Head CT with no acute intracranial process, mild chronic microvascular disease, chronic right caudate stroke  MRI brain with a left thalamic infarct:   One right temporal chronic microhemorrhage Moderate burden of chronic microvascular disease  MRA with a small left ICA cavernous outpouching, infundibulum vs. aneurysm   Carotid duplex: 1. Mild echogenic plaque about the carotid bulbs and proximal internal carotid arteries, left greater than right, but with no hemodynamically significant stenosis by carotid Doppler ultrasound. 2. Antegrade flow within both vertebral arteries within the neck.   Impression: This is a 60 year old woman with multiple microvascular risk factors as detailed above, presenting with a small vessel stroke fortunately with limited deficits.  Extensively counseled on risk factor modification.  Specifically she was told not to take anymore Dupixent without further discussion with her dermatologist, but that she also has many other microvascular risk factors that contributed to her stroke.  Given she does not have a large vessel occlusion normotension is an appropriate goal.  If echocardiogram demonstrates left atrial enlargement, recommend 30-day event monitoring on discharge.  Do not suspect an intracardiac thrombus would be detected given minimal cardiac symptoms, but if one is found, would stop dual antiplatelet therapy and start therapeutic anticoagulation.  Neurology will follow up echo results, pending the study the patient is ready for discharge.  Recommendations:  # Left thalamic stroke - Stroke labs HgbA1c, fasting lipid panel as above, discussed with patient - MRI brain with a left thalamic  stroke as documented above - MRA of the brain without contrast with an infundibulum/aneurysm as discussed below - Echocardiogram, discharge pending - Carotid dopplers completed as above, no significant flow-limiting stenosis - Prophylactic therapy-Antiplatelet med: Aspirin - dose 386m PO or 3070mPR, followed by 81 mg daily - Plavix 300 mg load with 75 mg daily for 21 - 90 day course if no procedures planned - Risk factor modification, diet, exercise, blood pressure control, cholesterol control, and potential discontinuation of Dupixent discussed extensively with the patient - Patient will need to discuss [place] her dermatologist and was advised to reach out to  them ASAP - If PCP cannot obtain insurance approval for Repatha, recommend referral to advanced lipid clinic for assistance in this regard - Telemetry monitoring; will consider 30 day event monitor on discharge pending echocardiogram results - Blood pressure goal   - Normotension to be achieved gradually - PT consult, OT consult, Speech consult, unless patient is back to baseline - Outpatient neurology follow-up with Dr. 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 Manuella Ghazi to follow and consider referral to neurosurgery if indicated in the future   Lesleigh Noe MD-PhD [facility] [phone] [facility] coverage for [facility] is from 8 AM to 4 AM in-house and 4 PM to 8 PM by telephone/video. 8 PM to 8 AM emergent questions or overnight urgent questions should be addressed to Teleneurology On-call or [facility] neurohospitalist; contact information can be found on AMION

## 2020-12-02 NOTE — Discharge Summary (Signed)
Physician Discharge Summary  LORINA DUFFNER GOT:[id number] DOB: 1960/10/18 DOA: 12/01/2020  PCP: Olin Hauser, DO  Admit date: 12/01/2020 Discharge date: 12/02/2020  Admitted From: Home Disposition: Home  Recommendations for Outpatient Follow-up:  1. Follow up with PCP in 1-2 weeks 2. Follow-up with neurology 3. Follow-up with neurosurgery 4. Please obtain BMP/CBC in one week 5. Please follow up on the following pending results: None  Home Health: No Equipment/Devices: None Discharge Condition: Stable CODE STATUS: Full Diet recommendation: Heart Healthy   Brief/Interim Summary:  CHAVA DULAC is a 60 y.o. 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  She was referred to see a neurosurgeon for further recommendations.  Stroke work-up revealed dyslipidemia with elevated total cholesterol, triglyceride and LDL of 173 with goal of less than 70 and low HDL.  Apparently patient was unable to tolerate statin due to leg cramps, she tried to medications.  Her PCP has offered systemic treatment, most likely with Repatha.  She was advised to work with her PCP to get insurance authorization and start Repatha for better control of cholesterol for risk management.  She will also continue with DAPT for 3 weeks followed by aspirin only.  Patient was on Aledo for her atopic dermatitis and it can increased microvascular disease, she was advised to discussed with  her dermatologist and discontinue it.  Patient has A1c of 6.2 which makes her prediabetic.  She needs counseling and diet management which can be provided by her primary care provider.  PT evaluated her and they were recommending outpatient physical therapy.  Patient will continue rest of her home meds and follow-up with her providers.  Discharge Diagnoses:  Principal Problem:   Acute CVA (cerebrovascular accident) ([place]) Active Problems:   OSA on CPAP   Former smoker   Acquired hypothyroidism   Restless leg syndrome  Discharge Instructions  Discharge Instructions    Ambulatory Referral to Neuro Rehab   Complete by: As directed    Diet - low sodium heart healthy   Complete by: As directed    Discharge instructions   Complete by: As directed    It was pleasure taking care of you. As you were told by your neurologist please discuss with your dermatologist and stop taking Dupixent as it can increase your chances of getting another stroke. Discussed with your primary care provider and getting insurance authorization for Repatha for cholesterol as you are unable to tolerate other conventional cholesterol medication. You will continue taking aspirin and Plavix together for 3 weeks and then stop Plavix and continue with aspirin only. Follow-up with neurology as an outpatient.   Increase activity slowly   Complete by: As directed    Increase activity slowly   Complete by: As directed      Allergies as of 12/02/2020      Reactions   Balsam Other (See Comments)   Positive patch test   Benzalkonium Chloride Other (See Comments)   Positive patch test   Bronopol Other (See Comments)   2-Bromo-2-Nitropropane-1,3-Diol (Bronopol)-Positive patch test   Chlorzoxazone Nausea And Vomiting  Chromium Other (See Comments)   Potassium dichromate-Positive patch test   Cocamidopropyl Betaine Other (See Comments)   Positive patch test   Crestor [rosuvastatin]    Tired and depressed    Elemental Sulfur Other (See Comments)   Hyperactive    Glutaral Other (See Comments)   Positive patch test   Latex Swelling   Methyldibromoglutaronitrile Other (See Comments)   Positive patch test   Propylene Glycol Other (See Comments)   Positive patch test   Tomato    Benzocaine Rash   Cobalt Rash   Dibucaine Rash   Formaldehyde Rash   Nickel Sulfate [nickel] Rash   Other Other (See Comments), Rash   Uncoded Allergy. Allergen: fragrance mix Uncoded Allergy. Allergen: ethylenediamine dihydrochloride Paraben mix-Positive patch test   Penicillins Rash   Potassium Dichromate Rash   Tetracaine Rash      Medication List    STOP taking these medications   clobetasol ointment 0.05 % Commonly known as: TEMOVATE   Dupixent 300 MG/2ML Sopn Generic drug: Dupilumab   omeprazole 40 MG capsule Commonly known as: PRILOSEC   tacrolimus 0.1 % ointment Commonly known as: PROTOPIC     TAKE these medications   acetaminophen 500 MG tablet Commonly known as: TYLENOL Take by mouth.   aspirin 81 MG chewable tablet Chew 1 tablet (81 mg total) by mouth daily. Start taking on: December 03, 2020   b complex vitamins tablet Take 1 tablet by mouth daily.   Calcium Carbonate-Vitamin D 600-400 MG-UNIT tablet Take 1 tablet by mouth daily.   clopidogrel 75 MG tablet Commonly known as: PLAVIX Take 1 tablet (75 mg total) by mouth daily. Start taking on: December 03, 2020   desoximetasone 0.25 % cream Commonly known as: TOPICORT Apply to affected areas twice daily. Avoid applying to face, groin, and axilla. Use as directed.   diclofenac Sodium 1 % Gel Commonly known as: VOLTAREN Apply 2 g topically 3 (three) times daily as needed.   esomeprazole 20 MG capsule Commonly known as: NEXIUM Take 20 mg by mouth in the morning and at bedtime.   fluticasone 50 MCG/ACT nasal spray Commonly known as: FLONASE Place 2 sprays into both nostrils daily.   ibuprofen 200 MG tablet Commonly known as:  ADVIL Take 400 mg by mouth 2 (two) times daily.   levothyroxine 88 MCG tablet Commonly known as: SYNTHROID Take 1 tablet (88 mcg total) by mouth daily before breakfast.   liothyronine 5 MCG tablet Commonly known as: CYTOMEL Take 1 tablet (5 mcg total) by mouth daily.   montelukast 10 MG tablet Commonly known as: SINGULAIR Take 10 mg by mouth daily.   multivitamin with minerals Tabs tablet Take 1 tablet by mouth daily.   pramipexole 1.5 MG tablet Commonly known as: MIRAPEX Take 0.375 mg by mouth at bedtime. Quarter tab nightly   Trelegy Ellipta 100-62.5-25 MCG/INH Aepb Generic drug: Fluticasone-Umeclidin-Vilant Inhale into the lungs.   vitamin C 1000 MG tablet Take 1,000 mg by mouth daily.       Follow-up Information    Schedule an appointment as soon as possible for a visit  with Olin Hauser, DO.   Specialty: Family Medicine Why: For ER visit follow-up and reevaluation. Contact information: [place] [address] [phone]        Schedule an appointment as soon as possible for a visit  with Meade Maw, MD.   Specialty: Neurosurgery Why: For ER visit follow-up and reevaluation. Contact information: [place]  [facility] [place] [address] [phone]        Vladimir Crofts, MD. Schedule an appointment as soon as possible for a visit.   Specialty: Neurology Contact information: [facility] [place] [address] [phone]              Allergies  Allergen Reactions  . Balsam Other (See Comments)    Positive patch test  . Benzalkonium Chloride Other (See Comments)    Positive patch test  . Bronopol Other (See Comments)    2-Bromo-2-Nitropropane-1,3-Diol (Bronopol)-Positive patch test  . Chlorzoxazone Nausea And Vomiting  . Chromium Other (See Comments)    Potassium dichromate-Positive patch test  . Cocamidopropyl Betaine Other (See Comments)     Positive patch test  . Crestor [Rosuvastatin]     Tired and depressed  . Elemental Sulfur Other (See Comments)    Hyperactive   . Glutaral Other (See Comments)    Positive patch test  . Latex Swelling  . Methyldibromoglutaronitrile Other (See Comments)    Positive patch test  . Propylene Glycol Other (See Comments)    Positive patch test  . Tomato   . Benzocaine Rash  . Cobalt Rash  . Dibucaine Rash  . Formaldehyde Rash  . Nickel Sulfate [Nickel] Rash  . Other Other (See Comments) and Rash    Uncoded Allergy. Allergen: fragrance mix Uncoded Allergy. Allergen: ethylenediamine dihydrochloride  Paraben mix-Positive patch test  . Penicillins Rash  . Potassium Dichromate Rash  . Tetracaine Rash    Consultations:  Neurology  Procedures/Studies: CT HEAD WO CONTRAST  Result Date: 12/01/2020 CLINICAL DATA:  Right facial numbness and blurred vision which is resolved. Discoordination and gait abnormality. EXAM: CT HEAD WITHOUT CONTRAST TECHNIQUE: Contiguous axial images were obtained from the base of the skull through the vertex without intravenous contrast. COMPARISON:  None. FINDINGS: Brain: 0.8 by 0.5 cm chronic lacunar infarct of the head of the right caudate nucleus on image 11 series 3. This is sharply defined on image 25 of series 4 and likely chronic. Periventricular white matter and corona radiata hypodensities favor chronic ischemic microvascular white matter disease. Otherwise, the brainstem, cerebellum, cerebral peduncles, thalamus, basal ganglia, basilar cisterns, and ventricular system appear within normal limits. Vascular: Unremarkable Skull: Unremarkable Sinuses/Orbits: Unremarkable Other: No supplemental non-categorized findings. IMPRESSION: 1. No acute intracranial findings. 2. Periventricular white matter and corona radiata hypodensities favor chronic ischemic microvascular white matter disease. 3. 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  Electronically Signed   By: Van Clines M.D.   On: 12/01/2020 19:01   MR ANGIO HEAD WO CONTRAST  Result Date: 12/01/2020 CLINICAL DATA:  Initial evaluation for neuro deficit, stroke suspected. Right-sided weakness with abnormal coordination. EXAM: MRA HEAD WITHOUT CONTRAST TECHNIQUE: Angiographic images of the Circle of Willis were obtained using MRA technique without intravenous contrast. COMPARISON:  Prior head CT from earlier the same day. FINDINGS: ANTERIOR CIRCULATION: Visualized distal cervical segments of the internal carotid arteries are patent with antegrade flow. Petrous, cavernous, and supraclinoid segments patent without hemodynamically significant stenosis. 3 mm outpouching extending inferiorly from the cavernous left ICA at the region of the superior hypophyseal artery could reflect a small vascular infundibulum versus a small aneurysm (series 5, image 83). A1 segments widely patent. Normal anterior communicating artery complex. Anterior cerebral arteries patent to their distal aspects without stenosis. No M1 stenosis or occlusion. Normal MCA bifurcations. Distal MCA branches well perfused and symmetric. No proximal MCA  branch occlusion. POSTERIOR CIRCULATION: Both V4 segments patent to the vertebrobasilar junction without stenosis. Both PICA origins patent and normal. Basilar widely patent to its distal aspect without stenosis. Superior cerebellar arteries patent bilaterally. Both PCAs primarily supplied via the basilar and are well perfused to there distal aspects. IMPRESSION: 1. Negative intracranial MRA with no large vessel occlusion or hemodynamically significant stenosis identified. 2. 3 mm outpouching extending inferiorly from the cavernous left ICA at the region of the superior hypophyseal artery, which could reflect a small vascular infundibulum versus a small aneurysm. Attention at follow-up recommended. Electronically Signed   By: Jeannine Boga M.D.   On: 12/01/2020 22:43    MR BRAIN WO CONTRAST  Result Date: 12/02/2020 CLINICAL DATA:  Initial evaluation for neuro deficit, stroke suspected, right hand dysmetria. EXAM: MRI HEAD WITHOUT CONTRAST TECHNIQUE: Multiplanar, multiecho pulse sequences of the brain and surrounding structures were obtained without intravenous contrast. COMPARISON:  Prior head CT from earlier the same day. FINDINGS: Brain: Cerebral volume within normal limits for age. Scattered patchy T2/FLAIR hyperintensity within the periventricular and deep white matter both cerebral hemispheres most consistent with chronic small vessel ischemic disease, mild to moderate in nature. Superimposed remote lacunar infarct present at the right caudate head. 1.2 cm acute ischemic infarct present at the left thalamus (series 5, image 23). No associated hemorrhage or mass effect. No other evidence for acute or subacute ischemia. Gray-white matter differentiation otherwise maintained. No other acute intracranial hemorrhage. Single punctate chronic microhemorrhage noted at the mesial right temporal lobe, of doubtful significance in isolation. No mass lesion, midline shift or mass effect. No hydrocephalus or extra-axial fluid collection. Pituitary gland and suprasellar region within normal limits. Midline structures intact and normal. Vascular: Major intracranial vascular flow voids are well maintained. Skull and upper cervical spine: Craniocervical junction within normal limits. Bone marrow signal intensity normal. No scalp soft tissue abnormality. Sinuses/Orbits: Globes and orbital soft tissues within normal limits. Paranasal sinuses are largely clear. No mastoid effusion. Inner ear structures grossly normal. Other: None. IMPRESSION: 1. 1.2 cm acute ischemic nonhemorrhagic left thalamic infarct. 2. Underlying mild to moderate chronic microvascular ischemic disease with remote lacunar infarct at the right caudate head. Electronically Signed   By: Jeannine Boga M.D.   On:  12/02/2020 00:02   US Carotid Bilateral  Result Date: 12/02/2020 CLINICAL DATA:  Initial evaluation for acute stroke. EXAM: BILATERAL CAROTID DUPLEX ULTRASOUND TECHNIQUE: Pearline Cables scale imaging, color Doppler and duplex ultrasound were performed of bilateral carotid and vertebral arteries in the neck. COMPARISON:  Prior MRI from 12/01/2020. FINDINGS: Criteria: Quantification of carotid stenosis is based on velocity parameters that correlate the residual internal carotid diameter with NASCET-based stenosis levels, using the diameter of the distal internal carotid lumen as the denominator for stenosis measurement. The following velocity measurements were obtained: RIGHT ICA: 96/37 cm/sec CCA: 89/37 cm/sec SYSTOLIC ICA/CCA RATIO:  1.5 ECA: 107 cm/sec LEFT ICA: 100/35 cm/sec CCA: 34/28 cm/sec SYSTOLIC ICA/CCA RATIO:  1.3 ECA: 81 cm/sec RIGHT CAROTID ARTERY: Mild intimal thickening within the right CCA without hemodynamically significant stenosis. Intimal thickening extends to involve the right carotid bulb. Heterogeneous echogenic plaque seen at the proximal right ICA, but with no elevation in peak systolic velocity to suggest hemodynamically significant stenosis. Right ICA widely patent distally. RIGHT VERTEBRAL ARTERY:  Antegrade. LEFT CAROTID ARTERY: Diffuse intimal thickening seen throughout the left CCA, but with no hemodynamically significant stenosis. Heterogeneous echogenic plaque seen within the left carotid bulb, extending into the proximal left ICA. No significant  elevation in peak systolic velocity to suggest hemodynamically significant stenosis. Visualized portions of the left ICA widely patent distally. LEFT VERTEBRAL ARTERY:  Antegrade. IMPRESSION: 1. Mild echogenic plaque about the carotid bulbs and proximal internal carotid arteries, left greater than right, but with no hemodynamically significant stenosis by carotid Doppler ultrasound. 2. Antegrade flow within both vertebral arteries within the neck.  Electronically Signed   By: Jeannine Boga M.D.   On: 12/02/2020 03:09   ECHOCARDIOGRAM COMPLETE  Result Date: 12/02/2020    ECHOCARDIOGRAM REPORT   Patient Name:   YESENIA LOCURTO Date of Exam: 12/02/2020 Medical Rec #:  [id number]           Height:       62.0 in Accession #:    [id number]          Weight:       170.0 lb Date of Birth:  March 02, 1961            BSA:          1.784 m Patient Age:    60 years            BP:           175/86 mmHg Patient Gender: F                   HR:           104 bpm. Exam Location:  [facility] Procedure: 2D Echo, Color Doppler, Cardiac Doppler and Strain Analysis Indications:     I63.9 Stroke  History:         Patient has no prior history of Echocardiogram examinations.                  Risk Factors:Sleep Apnea, Hypertension and Dyslipidemia.  Sonographer:     Charmayne Sheer RDCS (AE) Referring Phys:  [id number] Athena Masse Diagnosing Phys: Bartholome Bill MD  Sonographer Comments: Suboptimal subcostal window. Global longitudinal strain was attempted. IMPRESSIONS  1. Left ventricular ejection fraction, by estimation, is 55 to 60%. The left ventricle has normal function. The left ventricle has no regional wall motion abnormalities. Left ventricular diastolic parameters are consistent with Grade I diastolic dysfunction (impaired relaxation).  2. Right ventricular systolic function is normal. The right ventricular size is normal.  3. The mitral valve is grossly normal. Trivial mitral valve regurgitation.  4. The aortic valve is grossly normal. Aortic valve regurgitation is not visualized. FINDINGS  Left Ventricle: Left ventricular ejection fraction, by estimation, is 55 to 60%. The left ventricle has normal function. The left ventricle has no regional wall motion abnormalities. The left ventricular internal cavity size was normal in size. There is  borderline left ventricular hypertrophy. Left ventricular diastolic parameters are consistent with Grade I diastolic dysfunction (impaired  relaxation). Right Ventricle: The right ventricular size is normal. No increase in right ventricular wall thickness. Right ventricular systolic function is normal. Left Atrium: Left atrial size was normal in size. Right Atrium: Right atrial size was normal in size. Pericardium: There is no evidence of pericardial effusion. Mitral Valve: The mitral valve is grossly normal. Trivial mitral valve regurgitation. MV peak gradient, 4.2 mmHg. The mean mitral valve gradient is 2.0 mmHg. Tricuspid Valve: The tricuspid valve is grossly normal. Tricuspid valve regurgitation is trivial. Aortic Valve: The aortic valve is grossly normal. Aortic valve regurgitation is not visualized. Aortic valve mean gradient measures 6.0 mmHg. Aortic valve peak gradient measures 10.4 mmHg. Aortic valve area, by VTI measures 1.51  cm. Pulmonic Valve: The pulmonic valve was not well visualized. Pulmonic valve regurgitation is trivial. Aorta: The aortic root is normal in size and structure. IAS/Shunts: No atrial level shunt detected by color flow Doppler.  LEFT VENTRICLE PLAX 2D LVIDd:         4.10 cm  Diastology LVIDs:         2.90 cm  LV e' medial:    6.20 cm/s LV PW:         0.90 cm  LV E/e' medial:  11.0 LV IVS:        0.80 cm  LV e' lateral:   8.05 cm/s LVOT diam:     1.70 cm  LV E/e' lateral: 8.5 LV SV:         47 LV SV Index:   26 LVOT Area:     2.27 cm  RIGHT VENTRICLE RV Basal diam:  2.70 cm LEFT ATRIUM             Index       RIGHT ATRIUM          Index LA diam:        3.50 cm 1.96 cm/m  RA Area:     6.98 cm LA Vol (A2C):   29.7 ml 16.65 ml/m RA Volume:   10.20 ml 5.72 ml/m LA Vol (A4C):   38.0 ml 21.30 ml/m LA Biplane Vol: 34.7 ml 19.45 ml/m  AORTIC VALVE                    PULMONIC VALVE AV Area (Vmax):    1.44 cm     PV Vmax:       1.32 m/s AV Area (Vmean):   1.43 cm     PV Vmean:      96.200 cm/s AV Area (VTI):     1.51 cm     PV VTI:        0.246 m AV Vmax:           161.00 cm/s  PV Peak grad:  7.0 mmHg AV Vmean:           118.000 cm/s PV Mean grad:  4.0 mmHg AV VTI:            0.308 m AV Peak Grad:      10.4 mmHg AV Mean Grad:      6.0 mmHg LVOT Vmax:         102.00 cm/s LVOT Vmean:        74.500 cm/s LVOT VTI:          0.205 m LVOT/AV VTI ratio: 0.67  AORTA Ao Root diam: 2.30 cm MITRAL VALVE MV Area (PHT): 6.46 cm    SHUNTS MV Area VTI:   3.12 cm    Systemic VTI:  0.20 m MV Peak grad:  4.2 mmHg    Systemic Diam: 1.70 cm MV Mean grad:  2.0 mmHg MV Vmax:       1.02 m/s MV Vmean:      67.3 cm/s MV Decel Time: 118 msec MV E velocity: 68.35 cm/s MV A velocity: 93.20 cm/s MV E/A ratio:  0.73 Bartholome Bill MD Electronically signed by Bartholome Bill MD Signature Date/Time: 12/02/2020/3:53:48 PM    Final      Subjective: Patient was feeling better when seen today.  Continues to have mild hand uncoordination but stating that it is much improved than before.  She wants to go home stating that her 24 year old mom cannot  take care of her 4 dogs.  Discharge Exam: Vitals:   12/02/20 1209 12/02/20 1219  BP: (!) 175/86 (!) 175/86  Pulse:  87  Resp:  16  Temp:  98.2 F (36.8 C)  SpO2:  97%   Vitals:   12/02/20 0845 12/02/20 0900 12/02/20 1209 12/02/20 1219  BP:  (!) 151/83 (!) 175/86 (!) 175/86  Pulse: 67 85  87  Resp:    16  Temp:    98.2 F (36.8 C)  TempSrc:    Oral  SpO2: 99% 97%  97%  Weight:      Height:        General: Pt is alert, awake, not in acute distress Cardiovascular: RRR, S1/S2 +, no rubs, no gallops Respiratory: CTA bilaterally, no wheezing, no rhonchi Abdominal: Soft, NT, ND, bowel sounds + Extremities: no edema, no cyanosis   The results of significant diagnostics from this hospitalization (including imaging, microbiology, ancillary and laboratory) are listed below for reference.    Microbiology: Recent Results (from the past 240 hour(s))  Resp Panel by RT-PCR (Flu A&B, Covid) Nasopharyngeal Swab     Status: None   Collection Time: 12/02/20  1:38 AM   Specimen: Nasopharyngeal Swab;  Nasopharyngeal(NP) swabs in vial transport medium  Result Value Ref Range Status   SARS Coronavirus 2 by RT PCR NEGATIVE NEGATIVE Final    Comment: (NOTE) SARS-CoV-2 target nucleic acids are NOT DETECTED.  The SARS-CoV-2 RNA is generally detectable in upper respiratory specimens during the acute phase of infection. The lowest concentration of SARS-CoV-2 viral copies this assay can detect is 138 copies/mL. A negative result does not preclude SARS-Cov-2 infection and should not be used as the sole basis for treatment or other patient management decisions. A negative result may occur with  improper specimen collection/handling, submission of specimen other than nasopharyngeal swab, presence of viral mutation(s) within the areas targeted by this assay, and inadequate number of viral copies(<138 copies/mL). A negative result must be combined with clinical observations, patient history, and epidemiological information. The expected result is Negative.  Fact Sheet for Patients:  EntrepreneurPulse.com.au  Fact Sheet for Healthcare Providers:  IncredibleEmployment.be  This test is no t yet approved or cleared by the [place] FDA and  has been authorized for detection and/or diagnosis of SARS-CoV-2 by FDA under an Emergency Use Authorization (EUA). This EUA will remain  in effect (meaning this test can be used) for the duration of the COVID-19 declaration under Section 564(b)(1) of the Act, 21 U.S.C.section 360bbb-3(b)(1), unless the authorization is terminated  or revoked sooner.       Influenza A by PCR NEGATIVE NEGATIVE Final   Influenza B by PCR NEGATIVE NEGATIVE Final    Comment: (NOTE) The Xpert Xpress SARS-CoV-2/FLU/RSV plus assay is intended as an aid in the diagnosis of influenza from Nasopharyngeal swab specimens and should not be used as a sole basis for treatment. Nasal washings and aspirates are unacceptable for Xpert Xpress  SARS-CoV-2/FLU/RSV testing.  Fact Sheet for Patients: EntrepreneurPulse.com.au  Fact Sheet for Healthcare Providers: IncredibleEmployment.be  This test is not yet approved or cleared by the [place] FDA and has been authorized for detection and/or diagnosis of SARS-CoV-2 by FDA under an Emergency Use Authorization (EUA). This EUA will remain in effect (meaning this test can be used) for the duration of the COVID-19 declaration under Section 564(b)(1) of the Act, 21 U.S.C. section 360bbb-3(b)(1), unless the authorization is terminated or revoked.  Performed at [facility], [address] [place], [place] [address]      Labs: BNP (last 3 results) No results for input(s): BNP in the last 8760 hours. Basic Metabolic Panel: Recent Labs  Lab 12/01/20 1727 12/02/20 0354  NA 140  --   K 3.5  --   CL 106  --   CO2 24  --   GLUCOSE 110*  --   BUN 14  --   CREATININE 0.92 0.75  CALCIUM 9.5  --    Liver Function Tests: Recent Labs  Lab 12/01/20 1727  AST 32  ALT 33  ALKPHOS 63  BILITOT 0.5  PROT 8.0  ALBUMIN 4.0   No results for input(s): LIPASE, AMYLASE in the last 168 hours. No results for input(s): AMMONIA in the last 168 hours. CBC: Recent Labs  Lab 12/01/20 1659 12/02/20 0354  WBC 8.0 10.0  NEUTROABS 4.9  --   HGB 13.7 13.0  HCT 42.4 40.1  MCV 93.6 92.6  PLT 298 430*   Cardiac Enzymes: No results for input(s): CKTOTAL, CKMB, CKMBINDEX, TROPONINI in the last 168 hours. BNP: Invalid input(s): POCBNP CBG: No results for input(s): GLUCAP in the last 168 hours. D-Dimer No results for input(s): DDIMER in the last 72 hours. Hgb A1c Recent Labs    12/02/20 0356  HGBA1C 6.2*   Lipid Profile Recent Labs    12/02/20 0356  CHOL 277*  HDL 38*  LDLCALC 173*  TRIG 328*  CHOLHDL 7.3   Thyroid function studies No results for input(s): TSH, T4TOTAL, T3FREE, THYROIDAB in the last 72 hours.  Invalid  input(s): FREET3 Anemia work up No results for input(s): VITAMINB12, FOLATE, FERRITIN, TIBC, IRON, RETICCTPCT in the last 72 hours. Urinalysis    Component Value Date/Time   COLORURINE STRAW (A) 12/01/2020 1727   APPEARANCEUR CLEAR (A) 12/01/2020 1727   LABSPEC 1.011 12/01/2020 1727   PHURINE 5.0 12/01/2020 1727   GLUCOSEU NEGATIVE 12/01/2020 1727   HGBUR NEGATIVE 12/01/2020 1727   BILIRUBINUR NEGATIVE 12/01/2020 1727   KETONESUR NEGATIVE 12/01/2020 1727   PROTEINUR NEGATIVE 12/01/2020 1727   NITRITE NEGATIVE 12/01/2020 1727   LEUKOCYTESUR NEGATIVE 12/01/2020 1727   Sepsis Labs Invalid input(s): PROCALCITONIN,  WBC,  LACTICIDVEN Microbiology Recent Results (from the past 240 hour(s))  Resp Panel by RT-PCR (Flu A&B, Covid) Nasopharyngeal Swab     Status: None   Collection Time: 12/02/20  1:38 AM   Specimen: Nasopharyngeal Swab; Nasopharyngeal(NP) swabs in vial transport medium  Result Value Ref Range Status   SARS Coronavirus 2 by RT PCR NEGATIVE NEGATIVE Final    Comment: (NOTE) SARS-CoV-2 target nucleic acids are NOT DETECTED.  The SARS-CoV-2 RNA is generally detectable in upper respiratory specimens during the acute phase of infection. The lowest concentration of SARS-CoV-2 viral copies this assay can detect is 138 copies/mL. A negative result does not preclude SARS-Cov-2 infection and should not be used as the sole basis for treatment or other patient management decisions. A negative result may occur with  improper specimen collection/handling, submission of specimen other than nasopharyngeal swab, presence of viral mutation(s) within the areas targeted by this assay, and inadequate number of viral copies(<138 copies/mL). A negative result must be combined with clinical observations, patient history, and epidemiological information. The expected result is Negative.  Fact Sheet for Patients:  EntrepreneurPulse.com.au  Fact Sheet for Healthcare  Providers:  IncredibleEmployment.be  This test is no t yet approved or cleared by the [place] FDA and  has been authorized for detection and/or diagnosis of SARS-CoV-2 by  FDA under an Emergency Use Authorization (EUA). This EUA will remain  in effect (meaning this test can be used) for the duration of the COVID-19 declaration under Section 564(b)(1) of the Act, 21 U.S.C.section 360bbb-3(b)(1), unless the authorization is terminated  or revoked sooner.       Influenza A by PCR NEGATIVE NEGATIVE Final   Influenza B by PCR NEGATIVE NEGATIVE Final    Comment: (NOTE) The Xpert Xpress SARS-CoV-2/FLU/RSV plus assay is intended as an aid in the diagnosis of influenza from Nasopharyngeal swab specimens and should not be used as a sole basis for treatment. Nasal washings and aspirates are unacceptable for Xpert Xpress SARS-CoV-2/FLU/RSV testing.  Fact Sheet for Patients: EntrepreneurPulse.com.au  Fact Sheet for Healthcare Providers: IncredibleEmployment.be  This test is not yet approved or cleared by the [place] FDA and has been authorized for detection and/or diagnosis of SARS-CoV-2 by FDA under an Emergency Use Authorization (EUA). This EUA will remain in effect (meaning this test can be used) for the duration of the COVID-19 declaration under Section 564(b)(1) of the Act, 21 U.S.C. section 360bbb-3(b)(1), unless the authorization is terminated or revoked.  Performed at [facility], [address] [place], [place] [address]     Time coordinating discharge: Over 30 minutes  SIGNED:  Lorella Nimrod, MD  [facility] 12/02/2020, 4:01 PM  If 7PM-7AM, please contact night-coverage [email or web address]  This record has been created using Systems analyst. Errors have been sought and corrected,but may not always be located. Such creation errors do not reflect on the standard of care.

## 2020-12-02 NOTE — ED Notes (Signed)
RT called for cpap

## 2020-12-02 NOTE — H&P (Signed)
History and Physical    Sherry Oliver FHQ:[id number] DOB: 1961-02-07 DOA: 12/01/2020  PCP: Olin Hauser, DO   Patient coming from: Home  I have personally briefly reviewed patient's old medical records in [place]  Chief Complaint: Weakness right side  HPI: Sherry Oliver is a 60 y.o. female with medical history significant for tobacco use, HTN, OSA on CPAP, hypothyroidism, atopic dermatitis, presenting with a several hour history of feeling incoordinated.  Last normal was on going to bed on 3/19.  Patient awoke with blurred vision, feeling unsteady on her feet and feeling like she did not have adequate control over her right side, leading her to spill coffee due to lack of strength when holding her cup and also noted difficulty writing as she was making her grocery list.  She went to an urgent care center who sent her to the emergency department for evaluation for stroke. ED Course: On arrival, BP 170/115, pulse 103, respirations 18, temp 98.5.  Blood work all unremarkable. EKG as reviewed by me : Sinus tach at 108 with no acute ST-T wave changes Imaging: MRI brain with 1.2 cm acute ischemic nonhemorrhagic left thalamic infarct and mild to moderate chronic microvascular ischemic disease with remote lacunar infarct at the right caudate head.  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  Patient given aspirin.  No TPA as outside of window.  Hospitalist consulted for admission.  Review of Systems: As per HPI otherwise all other systems on review of systems negative.    Past Medical History:  Diagnosis Date  . Allergy   . Gastropathy   . GERD (gastroesophageal reflux disease)   . Headache   . Hyperlipidemia   . Hypertension   . Hypothyroid   . Lumbar radiculopathy   . Lung nodule   . Sleep apnea     Past Surgical History:  Procedure Laterality  Date  . APPENDECTOMY  2008  . BLADDER SURGERY  1965   bladder stem  . BREAST BIOPSY Left 06/06/2019   Lymph node US Bx, hydromarker. pending path   . COLONOSCOPY WITH PROPOFOL N/A 02/19/2015   Procedure: COLONOSCOPY WITH PROPOFOL;  Surgeon: Lollie Sails, MD;  Location: [facility] ENDOSCOPY;  Service: Endoscopy;  Laterality: N/A;  . COLONOSCOPY WITH PROPOFOL N/A 03/24/2018   Procedure: COLONOSCOPY WITH PROPOFOL;  Surgeon: Lollie Sails, MD;  Location: [facility] ENDOSCOPY;  Service: Endoscopy;  Laterality: N/A;  . ELBOW SURGERY Right 1982  . ESOPHAGOGASTRODUODENOSCOPY    . FOOT SURGERY Left 2007   neuroma removal, repeat 2008     reports that she quit smoking about 2 years ago. Her smoking use included cigarettes. She has quit using smokeless tobacco. She reports current alcohol use. She reports previous drug use.  Allergies  Allergen Reactions  . Balsam Other (See Comments)    Positive patch test  . Benzalkonium Chloride Other (See Comments)    Positive patch test  . Bronopol Other (See Comments)    2-Bromo-2-Nitropropane-1,3-Diol (Bronopol)-Positive patch test  . Chlorzoxazone Nausea And Vomiting  . Chromium Other (See Comments)    Potassium dichromate-Positive patch test  . Cocamidopropyl Betaine Other (See Comments)    Positive patch test  . Crestor [Rosuvastatin]     Tired and depressed  . Elemental Sulfur Other (See Comments)    Hyperactive   . Glutaral Other (See Comments)    Positive  patch test  . Latex Swelling  . Methyldibromoglutaronitrile Other (See Comments)    Positive patch test  . Propylene Glycol Other (See Comments)    Positive patch test  . Tomato   . Benzocaine Rash  . Cobalt Rash  . Dibucaine Rash  . Formaldehyde Rash  . Nickel Sulfate [Nickel] Rash  . Other Other (See Comments) and Rash    Uncoded Allergy. Allergen: fragrance mix Uncoded Allergy. Allergen: ethylenediamine dihydrochloride  Paraben mix-Positive patch test  . Penicillins Rash  .  Potassium Dichromate Rash  . Tetracaine Rash    Family History  Problem Relation Age of Onset  . Breast cancer Mother 24  . BRCA 1/2 Mother        positive for Brca 2  . Cancer Mother        bladder  . Breast cancer Maternal Aunt 78  . Breast cancer Maternal Grandfather        70's  . Alcohol abuse Father   . Lung cancer Father 60       mets to brain  . Heart disease Maternal Grandmother   . Stroke Paternal Grandmother       Prior to Admission medications   Medication Sig Start Date End Date Taking? Authorizing Provider  acetaminophen (TYLENOL) 500 MG tablet Take by mouth.    [provider]  Ascorbic Acid (VITAMIN C) 1000 MG tablet Take 1,000 mg by mouth daily.    [provider]  b complex vitamins tablet Take 1 tablet by mouth daily.    [provider]  Calcium Carbonate-Vitamin D 600-400 MG-UNIT per tablet Take 1 tablet by mouth daily.    [provider]  clobetasol ointment (TEMOVATE) 6.38 % Apply 1 application topically 2 (two) times daily as needed (rash).    [provider]  desoximetasone (TOPICORT) 0.25 % cream Apply to affected areas twice daily. Avoid applying to face, groin, and axilla. Use as directed. 01/29/20   Brendolyn Patty, MD  diclofenac Sodium (VOLTAREN) 1 % GEL Apply 2 g topically 3 (three) times daily as needed. 03/22/20   Karamalegos, Alexander J, DO  DUPIXENT 300 MG/2ML SOPN INJECT 300 MG (1 PEN) UNDER THE SKIN EVERY 2 WEEKS 07/10/20   Brendolyn Patty, MD  esomeprazole (NEXIUM) 20 MG capsule Take 20 mg by mouth in the morning and at bedtime.    [provider]  fluticasone (FLONASE) 50 MCG/ACT nasal spray Place 2 sprays into both nostrils daily.     [provider]  Fluticasone-Umeclidin-Vilant (TRELEGY ELLIPTA) 100-62.5-25 MCG/INH AEPB Inhale into the lungs. 06/22/19   [provider]  ibuprofen (ADVIL,MOTRIN) 200 MG tablet Take 400 mg by mouth 2 (two) times daily.    [provider]   levothyroxine (SYNTHROID) 88 MCG tablet Take 1 tablet (88 mcg total) by mouth daily before breakfast. 07/16/20   Malfi, Lupita Raider, FNP  liothyronine (CYTOMEL) 5 MCG tablet Take 1 tablet (5 mcg total) by mouth daily. 07/16/20   Malfi, Lupita Raider, FNP  montelukast (SINGULAIR) 10 MG tablet Take 10 mg by mouth daily. 10/09/20   [provider]  Multiple Vitamin (MULTIVITAMIN WITH MINERALS) TABS tablet Take 1 tablet by mouth daily.    [provider]  omeprazole (PRILOSEC) 40 MG capsule Take by mouth. 12/11/19 12/10/20  [provider]  pramipexole (MIRAPEX) 1.5 MG tablet Take 0.375 mg by mouth at bedtime. Quarter tab nightly     [provider]  tacrolimus (PROTOPIC) 0.1 % ointment APPLY TO AFFECTED  AREA TWICE A DAY AS DIRECTED 08/09/19   [provider]    Physical Exam: Vitals:   12/01/20 2300 12/01/20 2354 12/02/20 0000 12/02/20 0030  BP: (!) 172/96 (!) 170/94 (!) 164/87 (!) 170/108  Pulse: 90 88 93 94  Resp: (!) 22 (!) 24 14 (!) 25  Temp:      TempSrc:      SpO2: 96% 96% 96% 97%  Weight:      Height:         Vitals:   12/01/20 2300 12/01/20 2354 12/02/20 0000 12/02/20 0030  BP: (!) 172/96 (!) 170/94 (!) 164/87 (!) 170/108  Pulse: 90 88 93 94  Resp: (!) 22 (!) 24 14 (!) 25  Temp:      TempSrc:      SpO2: 96% 96% 96% 97%  Weight:      Height:          Constitutional: Alert and oriented x 3 . Not in any apparent distress HEENT:      Head: Normocephalic and atraumatic.         Eyes: PERLA, EOMI, Conjunctivae are normal. Sclera is non-icteric.       Mouth/Throat: Mucous membranes are moist.       Neck: Supple with no signs of meningismus. Cardiovascular: Regular rate and rhythm. No murmurs, gallops, or rubs. 2+ symmetrical distal pulses are present . No JVD. No LE edema Respiratory: Respiratory effort normal .Lungs sounds clear bilaterally. No wheezes, crackles, or rhonchi.  Gastrointestinal: Soft, non tender, and non distended with  positive bowel sounds.  Genitourinary: No CVA tenderness. Musculoskeletal: Nontender with normal range of motion in all extremities. No cyanosis, or erythema of extremities. Neurologic:  Face is symmetric.  Mild abnormality in finger-to-nose movement on the right otherwise no appreciable deficit  skin: Skin is warm, dry.  No rash or ulcers Psychiatric: Mood and affect are normal    Labs on Admission: I have personally reviewed following labs and imaging studies  CBC: Recent Labs  Lab 12/01/20 1659  WBC 8.0  NEUTROABS 4.9  HGB 13.7  HCT 42.4  MCV 93.6  PLT 638   Basic Metabolic Panel: Recent Labs  Lab 12/01/20 1727  NA 140  K 3.5  CL 106  CO2 24  GLUCOSE 110*  BUN 14  CREATININE 0.92  CALCIUM 9.5   GFR: Estimated Creatinine Clearance: 61.7 mL/min (by C-G formula based on SCr of 0.92 mg/dL). Liver Function Tests: Recent Labs  Lab 12/01/20 1727  AST 32  ALT 33  ALKPHOS 63  BILITOT 0.5  PROT 8.0  ALBUMIN 4.0   No results for input(s): LIPASE, AMYLASE in the last 168 hours. No results for input(s): AMMONIA in the last 168 hours. Coagulation Profile: Recent Labs  Lab 12/01/20 1659  INR 1.0   Cardiac Enzymes: No results for input(s): CKTOTAL, CKMB, CKMBINDEX, TROPONINI in the last 168 hours. BNP (last 3 results) No results for input(s): PROBNP in the last 8760 hours. HbA1C: No results for input(s): HGBA1C in the last 72 hours. CBG: No results for input(s): GLUCAP in the last 168 hours. Lipid Profile: No results for input(s): CHOL, HDL, LDLCALC, TRIG, CHOLHDL, LDLDIRECT in the last 72 hours. Thyroid Function Tests: No results for input(s): TSH, T4TOTAL, FREET4, T3FREE, THYROIDAB in the last 72 hours. Anemia Panel: No results for input(s): VITAMINB12, FOLATE, FERRITIN, TIBC, IRON, RETICCTPCT in the last 72 hours. Urine analysis:    Component Value Date/Time   COLORURINE STRAW (A) 12/01/2020 1727  APPEARANCEUR CLEAR (A) 12/01/2020 1727   LABSPEC 1.011  12/01/2020 1727   PHURINE 5.0 12/01/2020 1727   GLUCOSEU NEGATIVE 12/01/2020 1727   HGBUR NEGATIVE 12/01/2020 1727   BILIRUBINUR NEGATIVE 12/01/2020 1727   KETONESUR NEGATIVE 12/01/2020 1727   PROTEINUR NEGATIVE 12/01/2020 1727   NITRITE NEGATIVE 12/01/2020 1727   LEUKOCYTESUR NEGATIVE 12/01/2020 1727    Radiological Exams on Admission: CT HEAD WO CONTRAST  Result Date: 12/01/2020 CLINICAL DATA:  Right facial numbness and blurred vision which is resolved. Discoordination and gait abnormality. EXAM: CT HEAD WITHOUT CONTRAST TECHNIQUE: Contiguous axial images were obtained from the base of the skull through the vertex without intravenous contrast. COMPARISON:  None. FINDINGS: Brain: 0.8 by 0.5 cm chronic lacunar infarct of the head of the right caudate nucleus on image 11 series 3. This is sharply defined on image 25 of series 4 and likely chronic. Periventricular white matter and corona radiata hypodensities favor chronic ischemic microvascular white matter disease. Otherwise, the brainstem, cerebellum, cerebral peduncles, thalamus, basal ganglia, basilar cisterns, and ventricular system appear within normal limits. Vascular: Unremarkable Skull: Unremarkable Sinuses/Orbits: Unremarkable Other: No supplemental non-categorized findings. IMPRESSION: 1. No acute intracranial findings. 2. Periventricular white matter and corona radiata hypodensities favor chronic ischemic microvascular white matter disease. 3. 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 Electronically Signed   By: Van Clines M.D.   On: 12/01/2020 19:01   MR ANGIO HEAD WO CONTRAST  Result Date: 12/01/2020 CLINICAL DATA:  Initial evaluation for neuro deficit, stroke suspected. Right-sided weakness with abnormal coordination. EXAM: MRA HEAD WITHOUT CONTRAST TECHNIQUE: Angiographic images of the Circle of Willis were obtained using MRA technique without intravenous contrast. COMPARISON:  Prior head CT from  earlier the same day. FINDINGS: ANTERIOR CIRCULATION: Visualized distal cervical segments of the internal carotid arteries are patent with antegrade flow. Petrous, cavernous, and supraclinoid segments patent without hemodynamically significant stenosis. 3 mm outpouching extending inferiorly from the cavernous left ICA at the region of the superior hypophyseal artery could reflect a small vascular infundibulum versus a small aneurysm (series 5, image 83). A1 segments widely patent. Normal anterior communicating artery complex. Anterior cerebral arteries patent to their distal aspects without stenosis. No M1 stenosis or occlusion. Normal MCA bifurcations. Distal MCA branches well perfused and symmetric. No proximal MCA branch occlusion. POSTERIOR CIRCULATION: Both V4 segments patent to the vertebrobasilar junction without stenosis. Both PICA origins patent and normal. Basilar widely patent to its distal aspect without stenosis. Superior cerebellar arteries patent bilaterally. Both PCAs primarily supplied via the basilar and are well perfused to there distal aspects. IMPRESSION: 1. Negative intracranial MRA with no large vessel occlusion or hemodynamically significant stenosis identified. 2. 3 mm outpouching extending inferiorly from the cavernous left ICA at the region of the superior hypophyseal artery, which could reflect a small vascular infundibulum versus a small aneurysm. Attention at follow-up recommended. Electronically Signed   By: Jeannine Boga M.D.   On: 12/01/2020 22:43   MR BRAIN WO CONTRAST  Result Date: 12/02/2020 CLINICAL DATA:  Initial evaluation for neuro deficit, stroke suspected, right hand dysmetria. EXAM: MRI HEAD WITHOUT CONTRAST TECHNIQUE: Multiplanar, multiecho pulse sequences of the brain and surrounding structures were obtained without intravenous contrast. COMPARISON:  Prior head CT from earlier the same day. FINDINGS: Brain: Cerebral volume within normal limits for age.  Scattered patchy T2/FLAIR hyperintensity within the periventricular and deep white matter both cerebral hemispheres most consistent with chronic small vessel ischemic disease, mild to moderate in nature.  Superimposed remote lacunar infarct present at the right caudate head. 1.2 cm acute ischemic infarct present at the left thalamus (series 5, image 23). No associated hemorrhage or mass effect. No other evidence for acute or subacute ischemia. Gray-white matter differentiation otherwise maintained. No other acute intracranial hemorrhage. Single punctate chronic microhemorrhage noted at the mesial right temporal lobe, of doubtful significance in isolation. No mass lesion, midline shift or mass effect. No hydrocephalus or extra-axial fluid collection. Pituitary gland and suprasellar region within normal limits. Midline structures intact and normal. Vascular: Major intracranial vascular flow voids are well maintained. Skull and upper cervical spine: Craniocervical junction within normal limits. Bone marrow signal intensity normal. No scalp soft tissue abnormality. Sinuses/Orbits: Globes and orbital soft tissues within normal limits. Paranasal sinuses are largely clear. No mastoid effusion. Inner ear structures grossly normal. Other: None. IMPRESSION: 1. 1.2 cm acute ischemic nonhemorrhagic left thalamic infarct. 2. Underlying mild to moderate chronic microvascular ischemic disease with remote lacunar infarct at the right caudate head. Electronically Signed   By: Jeannine Boga M.D.   On: 12/02/2020 00:02     Assessment/Plan 60 year old female, ex-smoker with history of HTN, hypothyroidism and OSA on CPAP presenting with a several hour history of weakness, unsteadiness and incoordination of the right upper extremity.    Acute CVA (cerebrovascular accident) [facility]) -Patient presents with right-sided incoordination mainly upper extremity starting 24 hours prior to presentation outside TPA window -MRI showed  small thalamic acute infarct -Received aspirin in the emergency room -Aspirin and statin -Allow permissive hypertension -Stroke work-up to include continuous cardiac monitoring echocardiogram and carotid Doppler -Neurology consult  Hypertension -BP 164/87 -Hold meds to allow permissive hypertension    OSA on CPAP -CPAP if desired    Acquired hypothyroidism -Continue levothyroxine    DVT prophylaxis: Lovenox  Code Status: full code  Family Communication:  none  Disposition Plan: Back to previous home environment Consults called: Neurology Status: Observation    Athena Masse MD [facility]     12/02/2020, 1:09 AM

## 2020-12-02 NOTE — ED Notes (Signed)
Report received from [facility]. Patient transferred to C-Pod.

## 2020-12-02 NOTE — Evaluation (Signed)
Occupational Therapy Evaluation Patient Details Name: Sherry Oliver MRN: [id number] DOB: [birth year] Today's Date: 12/02/2020    History of Present Illness 60 y.o. female with medical history significant for tobacco use, HTN, OSA on CPAP, hypothyroidism, atopic dermatitis, presenting with a several hour history of blurred vision, unsteadiness, and decreased control of R side. MRI showed small thalamic acute infarct.   Clinical Impression   Pt seen for OT evaluation this date. Upon arrival to room, pt sitting at EOB and eating lunch. Pt was agreeable to OT eval. Prior to admission, pt was independent in all ADLs, IADLs, and functional mobility, living in a 2-story home with her mother (whom she cares for). Pt reports that she has had no difficulty with feeding/eating, dressing, toileting, or brushing teeth while in hospital. Pt does endorse increased difficulty with RUE motor planning and fine motor tasks (i.e., writing). Pt educated on signs and symptoms of stroke, and importance of family knowledge regarding signs and symptoms; pt verbalized understanding. Pt would benefit from skilled OT services to address above deficits and maximize return to PLOF. Upon discharge, recommend outpatient OT.     Follow Up Recommendations  Outpatient OT    Equipment Recommendations  None recommended by OT       Precautions / Restrictions Restrictions Weight Bearing Restrictions: No      Mobility Bed Mobility  Not assessed; pt received sitting EOB and eating lunch                                   Balance Overall balance assessment: Needs assistance Sitting-balance support: No upper extremity supported;Feet unsupported Sitting balance-Leahy Scale: Good Sitting balance - Comments: Good balance while sitting EOB with feet unsupported to eat                                   ADL either performed or assessed with clinical judgement   ADL Overall ADL's : Modified  independent                                       General ADL Comments: Pt reports that she's had no difficulty with feeding/eating, dressing, toileting, or brushing teeth. Pt does endorse increased difficulty with using RUE to write     Vision Baseline Vision/History: Wears glasses Wears Glasses: At all times Patient Visual Report: Other (comment) Additional Comments: Pt reports that she initially had blurred vision, but that vision has returned to baseline            Pertinent Vitals/Pain Pain Assessment: No/denies pain     Hand Dominance Right   Extremity/Trunk Assessment Upper Extremity Assessment Upper Extremity Assessment: RUE deficits/detail RUE Deficits / Details: WFL during ADLs, however pt reporting increased difficulty with motor planning and fine motor tasks (i.e., writing). RUE Sensation: WNL RUE Coordination: decreased fine motor   Lower Extremity Assessment Lower Extremity Assessment: Overall WFL for tasks assessed;Defer to PT evaluation       Communication Communication Communication: No difficulties   Cognition Arousal/Alertness: Awake/alert Behavior During Therapy: WFL for tasks assessed/performed Overall Cognitive Status: Within Functional Limits for tasks assessed  Exercises Other Exercises Other Exercises: Provided education on role of OT, POC, and d/c recommendations, with pt verbalizing understanding. Other Exercises: Provided education on signs and symptoms of stroke, and importance of family knowledge regarding signs and symptoms   Shoulder Instructions      Home Living Family/patient expects to be discharged to:: Private residence Living Arrangements: Parent (is caregiver for 62yo mother) Available Help at Discharge: Family Type of Home: House Home Access: Stairs to enter Technical brewer of Steps: 4 Entrance Stairs-Rails: Can reach both Home Layout: Two  level Alternate Level Stairs-Number of Steps: flight             Home Equipment: None          Prior Functioning/Environment Level of Independence: Independent        Comments: reports very active, driving and taking care of elderly mother        OT Problem List: Decreased coordination;Impaired UE functional use      OT Treatment/Interventions: Neuromuscular education;Therapeutic activities;Patient/family education;Balance training    OT Goals(Current goals can be found in the care plan section) Acute Rehab OT Goals Patient Stated Goal: to go home OT Goal Formulation: With patient Time For Goal Achievement: 12/16/20 Potential to Achieve Goals: Good ADL Goals Pt Will Perform Grooming: with modified independence;standing Pt/caregiver will Perform Home Exercise Program: Increased strength;Right Upper extremity;Independently;With written HEP provided  OT Frequency: Min 1X/week    AM-PAC OT "6 Clicks" Daily Activity     Outcome Measure Help from another person eating meals?: None Help from another person taking care of personal grooming?: None Help from another person toileting, which includes using toliet, bedpan, or urinal?: None Help from another person bathing (including washing, rinsing, drying)?: None Help from another person to put on and taking off regular upper body clothing?: None Help from another person to put on and taking off regular lower body clothing?: None 6 Click Score: 24   End of Session Nurse Communication: Mobility status  Activity Tolerance: Patient tolerated treatment well Patient left: in bed;with call bell/phone within reach  OT Visit Diagnosis: Hemiplegia and hemiparesis Hemiplegia - Right/Left: Right Hemiplegia - dominant/non-dominant: Dominant                Time: 8115-7262 OT Time Calculation (min): 7 min Charges:  OT General Charges $OT Visit: 1 Visit OT Evaluation $OT Eval Low Complexity: [place],  OTR/L [place]

## 2020-12-02 NOTE — Progress Notes (Signed)
*  PRELIMINARY RESULTS* Echocardiogram 2D Echocardiogram has been performed.  Sherry Oliver 12/02/2020, 2:10 PM

## 2020-12-02 NOTE — Progress Notes (Signed)
SLP Cancellation Note  Patient Details Name: Sherry Oliver MRN: [id number] DOB: [birth year]   Cancelled treatment:       Reason Eval/Treat Not Completed: SLP screened, no needs identified, will sign off (chart reviewed; consulted NSG then met w/ pt).  Pt denied any difficulty swallowing and is currently on a regular diet; tolerates swallowing pills w/ water per NSG. Assisted pt in ordering her Lunch meal. Pt conversed at conversational level w/out deficits noted; pt and NSG denied any speech-language deficits.  No further skilled ST services indicated as pt appears at her baseline. Pt agreed. NSG to reconsult if any change in status while admitted.     Orinda Kenner, MS, CCC-SLP Speech Language Pathologist Rehab Services [phone] [facility] 12/02/2020, 11:55 AM

## 2020-12-03 DIAGNOSIS — M79671 Pain in right foot: Secondary | ICD-10-CM | POA: Diagnosis not present

## 2020-12-03 DIAGNOSIS — B351 Tinea unguium: Secondary | ICD-10-CM | POA: Diagnosis not present

## 2020-12-03 DIAGNOSIS — M79672 Pain in left foot: Secondary | ICD-10-CM | POA: Diagnosis not present

## 2020-12-03 DIAGNOSIS — M19071 Primary osteoarthritis, right ankle and foot: Secondary | ICD-10-CM | POA: Diagnosis not present

## 2020-12-03 DIAGNOSIS — D2372 Other benign neoplasm of skin of left lower limb, including hip: Secondary | ICD-10-CM | POA: Diagnosis not present

## 2020-12-03 DIAGNOSIS — Q828 Other specified congenital malformations of skin: Secondary | ICD-10-CM | POA: Diagnosis not present

## 2020-12-04 ENCOUNTER — Telehealth: Payer: Self-pay

## 2020-12-04 NOTE — Telephone Encounter (Signed)
Patient called regarding Dupixent. Patient states over the last weekend she had a mild stroke. Patient states ER doctor advised her to D/C Dupixent injections until discussing matters with you.

## 2020-12-06 ENCOUNTER — Encounter: Payer: Self-pay | Admitting: Family Medicine

## 2020-12-06 ENCOUNTER — Other Ambulatory Visit: Payer: Self-pay

## 2020-12-06 ENCOUNTER — Ambulatory Visit (INDEPENDENT_AMBULATORY_CARE_PROVIDER_SITE_OTHER): Payer: BC Managed Care – PPO | Admitting: Family Medicine

## 2020-12-06 VITALS — BP 160/84 | HR 88 | Ht 62.0 in | Wt 173.0 lb

## 2020-12-06 DIAGNOSIS — I10 Essential (primary) hypertension: Secondary | ICD-10-CM | POA: Diagnosis not present

## 2020-12-06 DIAGNOSIS — R7309 Other abnormal glucose: Secondary | ICD-10-CM

## 2020-12-06 DIAGNOSIS — I6930 Unspecified sequelae of cerebral infarction: Secondary | ICD-10-CM

## 2020-12-06 DIAGNOSIS — E782 Mixed hyperlipidemia: Secondary | ICD-10-CM

## 2020-12-06 MED ORDER — AMLODIPINE BESYLATE 5 MG PO TABS: 5.0000 mg | ORAL_TABLET | Freq: Every day | ORAL | 3 refills | Status: DC

## 2020-12-06 MED ORDER — PRAVASTATIN SODIUM 20 MG PO TABS: 20.0000 mg | ORAL_TABLET | Freq: Every day | ORAL | 3 refills | Status: DC

## 2020-12-06 NOTE — Progress Notes (Signed)
Subjective:    Patient ID: Sherry Oliver, female    DOB: [birth year], 60 y.o.   MRN: [id number]  Sherry Oliver is a 60 y.o. female presenting on 12/06/2020 for Hospitalization Follow-up and Cerebrovascular Accident   HPI   ED FOLLOW-UP VISIT  Hospital/Location: [place] Date of ED Visit: 12/01/20  Reason for Presenting to ED: Stroke Primary (+Secondary) Diagnosis: Acute small thalamic infarction  FOLLOW-UP  - ED provider note and record have been reviewed - Patient presents today about 5 days after recent ED visit. Brief summary of recent course, patient had symptoms of R sided arm and leg difficulty or weakness when woke up Saturday 3/19, worsening throughout the day and evening she had R side of facial numbness, next day Sunday 3/20 still has symptoms, presented to Urgent Care 12/01/20 and then taken to [facility] ED 12/01/20, testing in ED with CT identified CVA and had MRI MRA, did not get admitted, had rest of work up within 24hours in holding at ED. Left with elevated BP due to permissive HTN. Advised to start statin, given DAPT  - Today reports overall has done well after discharge from ED. Symptoms of weakness R sided and dysmetria have improved  - New medications on discharge: ASA 81 and Plavix 75 daily - DAPT for 3 weeks as advised by hospital.  Discharged home from ED unable to get room in hospital for admission.  Expecting ambulatory referral for PT not been called yet.  Follow up apt scheduled w/ Dr Lanelle Bal Neuro on 3/29 next week and also Dr Izora Ribas [facility] on 12/26/20)  Also keeping her upcoming Cardiology 01/2021  BP elevated still today Current Meds -nonecurrently Lifestyle: - Diet:admits some sodium / caffeine in diet, not always hydrating - Exercise:not as active due to knee pain Denies CP, dyspnea, HA, edema, dizziness / lightheadedness  HYPERLIPIDEMIA: - Reportshistory issues of high cholesterol and failed statins due to myalgia. Lab  in 11/2020 Elevated LDL >170 - Niacin - Not on Fish Oil - Rosuvastatin (crestor), Pitavstatin, Atorvastatin (lipitor), Simvastatin (zocor)   Pre-Diabetes A1c up to 6.2 in hospital  Former Smoker  OSA on CPAP   have reviewed the discharge medication list, and have reconciled the current and discharge medications today.    Health Maintenance: Mammogram due, last done 11/2019   Depression screen [facility] 2/9 04/19/2020 11/17/2019  Decreased Interest 0 0  Down, Depressed, Hopeless 0 0  PHQ - 2 Score 0 0    Social History   Tobacco Use  . Smoking status: Former Smoker    Types: Cigarettes    Quit date: 03/14/2018    Years since quitting: 2.7  . Smokeless tobacco: Former Network engineer  . Vaping Use: Never used  Substance Use Topics  . Alcohol use: Yes    Comment: rare  . Drug use: Not Currently    Review of Systems Per HPI unless specifically indicated above     Objective:    BP (!) 160/84 (BP Location: Left Arm, Cuff Size: Normal)   Pulse 88   Ht 5\' 2"  (1.575 m)   Wt 173 lb (78.5 kg)   SpO2 99%   BMI 31.64 kg/m   Wt Readings from Last 3 Encounters:  12/06/20 173 lb (78.5 kg)  12/01/20 170 lb (77.1 kg)  12/01/20 170 lb (77.1 kg)    Physical Exam Vitals and nursing note reviewed.  Constitutional:      General: She is not in acute distress.  Appearance: She is well-developed. She is not diaphoretic.     Comments: Well-appearing, comfortable, cooperative  HENT:     Head: Normocephalic and atraumatic.  Eyes:     General:        Right eye: No discharge.        Left eye: No discharge.     Conjunctiva/sclera: Conjunctivae normal.  Neck:     Thyroid: No thyromegaly.  Cardiovascular:     Rate and Rhythm: Normal rate and regular rhythm.     Heart sounds: Normal heart sounds. No murmur heard.   Pulmonary:     Effort: Pulmonary effort is normal. No respiratory distress.     Breath sounds: Normal breath sounds. No wheezing or rales.  Musculoskeletal:         General: Normal range of motion.     Cervical back: Normal range of motion and neck supple.     Comments: Mild residual weakness R upper / lower extremity  Lymphadenopathy:     Cervical: No cervical adenopathy.  Skin:    General: Skin is warm and dry.     Findings: No erythema or rash.  Neurological:     Mental Status: She is alert and oriented to person, place, and time.  Psychiatric:        Behavior: Behavior normal.     Comments: Well groomed, good eye contact, normal speech and thoughts      I have personally reviewed the radiology report from 12/01/20.  CT HEAD WO CONTRASTPerformed 12/01/2020 Final result  Study Result CLINICAL DATA: Right facial numbness and blurred vision which is resolved. Discoordination and gait abnormality.  EXAM: CT HEAD WITHOUT CONTRAST  TECHNIQUE: Contiguous axial images were obtained from the base of the skull through the vertex without intravenous contrast.  COMPARISON: None.  FINDINGS: Brain: 0.8 by 0.5 cm chronic lacunar infarct of the head of the right caudate nucleus on image 11 series 3. This is sharply defined on image 25 of series 4 and likely chronic.  Periventricular white matter and corona radiata hypodensities favor chronic ischemic microvascular white matter disease. Otherwise, the brainstem, cerebellum, cerebral peduncles, thalamus, basal ganglia, basilar cisterns, and ventricular system appear within normal limits.  Vascular: Unremarkable  Skull: Unremarkable  Sinuses/Orbits: Unremarkable  Other: No supplemental non-categorized findings.  IMPRESSION: 1. No acute intracranial findings. 2. Periventricular white matter and corona radiata hypodensities favor chronic ischemic microvascular white matter disease. 3. 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   Electronically Signed By: Van Clines M.D. On: 12/01/2020 19:01  -------  MR BRAIN WO CONTRASTPerformed 12/01/2020 Final  result  Study Result CLINICAL DATA: Initial evaluation for neuro deficit, stroke suspected, right hand dysmetria.  EXAM: MRI HEAD WITHOUT CONTRAST  TECHNIQUE: Multiplanar, multiecho pulse sequences of the brain and surrounding structures were obtained without intravenous contrast.  COMPARISON: Prior head CT from earlier the same day.  FINDINGS: Brain: Cerebral volume within normal limits for age. Scattered patchy T2/FLAIR hyperintensity within the periventricular and deep white matter both cerebral hemispheres most consistent with chronic small vessel ischemic disease, mild to moderate in nature. Superimposed remote lacunar infarct present at the right caudate head.  1.2 cm acute ischemic infarct present at the left thalamus (series 5, image 23). No associated hemorrhage or mass effect. No other evidence for acute or subacute ischemia. Gray-white matter differentiation otherwise maintained. No other acute intracranial hemorrhage. Single punctate chronic microhemorrhage noted at the mesial right temporal lobe, of doubtful significance in isolation.  No mass lesion, midline shift or mass effect. No hydrocephalus or extra-axial fluid collection. Pituitary gland and suprasellar region within normal limits. Midline structures intact and normal.  Vascular: Major intracranial vascular flow voids are well maintained.  Skull and upper cervical spine: Craniocervical junction within normal limits. Bone marrow signal intensity normal. No scalp soft tissue abnormality.  Sinuses/Orbits: Globes and orbital soft tissues within normal limits. Paranasal sinuses are largely clear. No mastoid effusion. Inner ear structures grossly normal.  Other: None.  IMPRESSION: 1. 1.2 cm acute ischemic nonhemorrhagic left thalamic infarct. 2. Underlying mild to moderate chronic microvascular ischemic disease with remote lacunar infarct at the right caudate head.   Electronically Signed By:  Jeannine Boga M.D. On: 12/02/2020 00:02   ------  US Carotid BilateralPerformed 12/02/2020 Final result  Study Result CLINICAL DATA: Initial evaluation for acute stroke.  EXAM: BILATERAL CAROTID DUPLEX ULTRASOUND  TECHNIQUE: Pearline Cables scale imaging, color Doppler and duplex ultrasound were performed of bilateral carotid and vertebral arteries in the neck.  COMPARISON: Prior MRI from 12/01/2020.  FINDINGS: Criteria: Quantification of carotid stenosis is based on velocity parameters that correlate the residual internal carotid diameter with NASCET-based stenosis levels, using the diameter of the distal internal carotid lumen as the denominator for stenosis measurement.  The following velocity measurements were obtained:  RIGHT  ICA: 96/37 cm/sec  CCA: 57/84 cm/sec  SYSTOLIC ICA/CCA RATIO: 1.5  ECA: 107 cm/sec  LEFT  ICA: 100/35 cm/sec  CCA: 69/62 cm/sec  SYSTOLIC ICA/CCA RATIO: 1.3  ECA: 81 cm/sec  RIGHT CAROTID ARTERY: Mild intimal thickening within the right CCA without hemodynamically significant stenosis. Intimal thickening extends to involve the right carotid bulb. Heterogeneous echogenic plaque seen at the proximal right ICA, but with no elevation in peak systolic velocity to suggest hemodynamically significant stenosis. Right ICA widely patent distally.  RIGHT VERTEBRAL ARTERY: Antegrade.  LEFT CAROTID ARTERY: Diffuse intimal thickening seen throughout the left CCA, but with no hemodynamically significant stenosis. Heterogeneous echogenic plaque seen within the left carotid bulb, extending into the proximal left ICA. No significant elevation in peak systolic velocity to suggest hemodynamically significant stenosis. Visualized portions of the left ICA widely patent distally.  LEFT VERTEBRAL ARTERY: Antegrade.  IMPRESSION: 1. Mild echogenic plaque about the carotid bulbs and proximal internal carotid arteries, left greater than right, but with  no hemodynamically significant stenosis by carotid Doppler ultrasound. 2. Antegrade flow within both vertebral arteries within the neck.   Electronically Signed By: Jeannine Boga M.D. On: 12/02/2020 03:09       Results for orders placed or performed during the hospital encounter of 12/01/20  Resp Panel by RT-PCR (Flu A&B, Covid) Nasopharyngeal Swab   Specimen: Nasopharyngeal Swab; Nasopharyngeal(NP) swabs in vial transport medium  Result Value Ref Range   SARS Coronavirus 2 by RT PCR NEGATIVE NEGATIVE   Influenza A by PCR NEGATIVE NEGATIVE   Influenza B by PCR NEGATIVE NEGATIVE  Protime-INR  Result Value Ref Range   Prothrombin Time 12.3 11.4 - 15.2 seconds   INR 1.0 0.8 - 1.2  APTT  Result Value Ref Range   aPTT <24 (L) 24 - 36 seconds  CBC  Result Value Ref Range   WBC 8.0 4.0 - 10.5 K/uL   RBC 4.53 3.87 - 5.11 MIL/uL   Hemoglobin 13.7 12.0 - 15.0 g/dL   HCT 42.4 36.0 - 46.0 %   MCV 93.6 80.0 - 100.0 fL   MCH 30.2 26.0 - 34.0 pg   MCHC 32.3 30.0 - 36.0 g/dL  RDW 13.6 11.5 - 15.5 %   Platelets 298 150 - 400 K/uL   nRBC 0.0 0.0 - 0.2 %  Differential  Result Value Ref Range   Neutrophils Relative % 61 %   Neutro Abs 4.9 1.7 - 7.7 K/uL   Lymphocytes Relative 25 %   Lymphs Abs 2.0 0.7 - 4.0 K/uL   Monocytes Relative 10 %   Monocytes Absolute 0.8 0.1 - 1.0 K/uL   Eosinophils Relative 3 %   Eosinophils Absolute 0.2 0.0 - 0.5 K/uL   Basophils Relative 1 %   Basophils Absolute 0.1 0.0 - 0.1 K/uL   Immature Granulocytes 0 %   Abs Immature Granulocytes 0.03 0.00 - 0.07 K/uL  Comprehensive metabolic panel  Result Value Ref Range   Sodium 140 135 - 145 mmol/L   Potassium 3.5 3.5 - 5.1 mmol/L   Chloride 106 98 - 111 mmol/L   CO2 24 22 - 32 mmol/L   Glucose, Bld 110 (H) 70 - 99 mg/dL   BUN 14 8 - 23 mg/dL   Creatinine, Ser 0.92 0.44 - 1.00 mg/dL   Calcium 9.5 8.9 - 10.3 mg/dL   Total Protein 8.0 6.5 - 8.1 g/dL   Albumin 4.0 3.5 - 5.0 g/dL   AST 32 15 - 41  U/L   ALT 33 0 - 44 U/L   Alkaline Phosphatase 63 38 - 126 U/L   Total Bilirubin 0.5 0.3 - 1.2 mg/dL   GFR, Estimated >60 >60 mL/min   Anion gap 10 5 - 15  Urinalysis, Complete w Microscopic  Result Value Ref Range   Color, Urine STRAW (A) YELLOW   APPearance CLEAR (A) CLEAR   Specific Gravity, Urine 1.011 1.005 - 1.030   pH 5.0 5.0 - 8.0   Glucose, UA NEGATIVE NEGATIVE mg/dL   Hgb urine dipstick NEGATIVE NEGATIVE   Bilirubin Urine NEGATIVE NEGATIVE   Ketones, ur NEGATIVE NEGATIVE mg/dL   Protein, ur NEGATIVE NEGATIVE mg/dL   Nitrite NEGATIVE NEGATIVE   Leukocytes,Ua NEGATIVE NEGATIVE   RBC / HPF 0-5 0 - 5 RBC/hpf   WBC, UA 0-5 0 - 5 WBC/hpf   Bacteria, UA NONE SEEN NONE SEEN   Squamous Epithelial / LPF 0-5 0 - 5  Urine Drug Screen, Qualitative  Result Value Ref Range   Tricyclic, Ur Screen NONE DETECTED NONE DETECTED   Amphetamines, Ur Screen NONE DETECTED NONE DETECTED   MDMA (Ecstasy)Ur Screen NONE DETECTED NONE DETECTED   Cocaine Metabolite,Ur [place] NONE DETECTED NONE DETECTED   Opiate, Ur Screen NONE DETECTED NONE DETECTED   Phencyclidine (PCP) Ur S NONE DETECTED NONE DETECTED   Cannabinoid 50 Ng, Ur [place] NONE DETECTED NONE DETECTED   Barbiturates, Ur Screen NONE DETECTED NONE DETECTED   Benzodiazepine, Ur Scrn NONE DETECTED NONE DETECTED   Methadone Scn, Ur NONE DETECTED NONE DETECTED  Hemoglobin A1c  Result Value Ref Range   Hgb A1c MFr Bld 6.2 (H) 4.8 - 5.6 %   Mean Plasma Glucose 131.24 mg/dL  Lipid panel  Result Value Ref Range   Cholesterol 277 (H) 0 - 200 mg/dL   Triglycerides 328 (H) <150 mg/dL   HDL 38 (L) >40 mg/dL   Total CHOL/HDL Ratio 7.3 RATIO   VLDL 66 (H) 0 - 40 mg/dL   LDL Cholesterol 173 (H) 0 - 99 mg/dL  CBC  Result Value Ref Range   WBC 10.0 4.0 - 10.5 K/uL   RBC 4.33 3.87 - 5.11 MIL/uL   Hemoglobin 13.0 12.0 -  15.0 g/dL   HCT 40.1 36.0 - 46.0 %   MCV 92.6 80.0 - 100.0 fL   MCH 30.0 26.0 - 34.0 pg   MCHC 32.4 30.0 - 36.0 g/dL   RDW 13.5 11.5  - 15.5 %   Platelets 430 (H) 150 - 400 K/uL   nRBC 0.0 0.0 - 0.2 %  Creatinine, serum  Result Value Ref Range   Creatinine, Ser 0.75 0.44 - 1.00 mg/dL   GFR, Estimated >60 >60 mL/min  HIV Antibody (routine testing w rflx)  Result Value Ref Range   HIV Screen 4th Generation wRfx Non Reactive Non Reactive  ECHOCARDIOGRAM COMPLETE  Result Value Ref Range   Weight 2,720 oz   Height 62 in   BP 175/86 mmHg   Ao pk vel 1.61 m/s   AV Area VTI 1.51 cm2   AR max vel 1.44 cm2   AV Mean grad 6.0 mmHg   AV Peak grad 10.4 mmHg   S' Lateral 2.90 cm   AV Area mean vel 1.43 cm2   Area-P 1/2 6.46 cm2   MV VTI 3.12 cm2      Assessment & Plan:   Problem List Items Addressed This Visit    Mixed hyperlipidemia   Relevant Medications   pravastatin (PRAVACHOL) 20 MG tablet   amLODipine (NORVASC) 5 MG tablet   History of cerebrovascular accident (CVA) with residual deficit - Primary   Relevant Medications   pravastatin (PRAVACHOL) 20 MG tablet   Essential hypertension   Relevant Medications   pravastatin (PRAVACHOL) 20 MG tablet   amLODipine (NORVASC) 5 MG tablet   Elevated hemoglobin A1c      History CVA recently w/ 1.2 cm acute ischemic nonhemorrhagic left thalamic infarct Residual deficits R sided, improving  Previously not on anti platelet therapy - now on discharge, on DAPT ASA 81 + Plavix 75mg  x 3 weeks, awaiting further input from Neurology  Risk factor with Hyperlipidemia - 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 Now will start trial on Pravastatin 20mg  nightly, can adjust dose accordingly, next option will need PCSK9 Inhibitor like Repatha or can consider refer directly to [facility].  Hypertension Chronic problem with some elevation, not on therapy Elevated BP still from hospital, now today repeat 160/80 still Given now >48 hours out, we will initiate low dose therapy for HTN control, outside window of permissive HTN Start Amlodipine 5mg  daily, titrate up accordingly  based on readings and advice from Neurology with how aggressive to manage HTN Also she has Cardiology will f/u with them.  She will need Neuro Rehab as ordered by Hospital on Discharge - order is in, not scheduled yet.  Upcoming apt with [facility] and [facility]    Meds ordered this encounter  Medications  . pravastatin (PRAVACHOL) 20 MG tablet    Sig: Take 1 tablet (20 mg total) by mouth daily.    Dispense:  90 tablet    Refill:  3  . amLODipine (NORVASC) 5 MG tablet    Sig: Take 1 tablet (5 mg total) by mouth daily.    Dispense:  90 tablet    Refill:  3      Follow up plan: Return in about 6 weeks (around 01/17/2021) for 6 week follow-up HTN, Post CVA, Neuro updates.  Will adjust HTN regimen, review tolerance to Pravastatin, updates from specialists. In 3 months from now can check CMET, Lipid, A1c trend following new therapy.  Nobie Putnam, [facility] 12/06/2020, 11:10 AM

## 2020-12-06 NOTE — Patient Instructions (Addendum)
Thank you for coming to the office today.  Start the Pravastatin 20mg  nightly  If you develop mild aches or pains in muscle or joint that does NOT improve or go away after first 3-4 weeks then this may require [place] to adjust the dose. First I would recommend STOPPING the medication for a few weeks until your ache and pain symptoms completely RESOLVE. Then you can restart at a LOWER DOSE either HALF a pill at bedtime every night or LESS OFTEN such as one pill a week only and then gradually increase to every other day or max dose of 3 times a week  Lastly, sometimes we need to try other versions of this medicine to find one that works for you and does not cause side effects.  For blood pressure, still elevated 160/80 range. Goal to gradually lower this. We can start a new med Amlodipine 5mg  daily, if you are able to monitor BP, goal is < 140/90 eventually. May need to double dose in future.  Please discuss with Neurology   1. Duration of therapy on anti platelet blood thinning medications - how long to take which one etc.  2. Blood pressure goal - now following stroke. Initially it was left higher, but now will need to treat it, get advice on when we can be more aggressive with BP- may need to double dose or add other med. We can see you back sooner for this if needed.  3. Ask them about Neuro Rehab PT orders.  Please schedule a Follow-up Appointment to: Return in about 6 weeks (around 01/17/2021) for 6 week follow-up HTN, Post CVA, Neuro updates.  If you have any other questions or concerns, please feel free to call the office or send a message through [place]. You may also schedule an earlier appointment if necessary.  Additionally, you may be receiving a survey about your experience at our office within a few days to 1 week by e-mail or mail. We value your feedback.  Nobie Putnam, DO [place]

## 2020-12-09 NOTE — Telephone Encounter (Signed)
Called patient and discussed the continued use of Dupixent for her eczema after recent mild stroke.  Stroke is not seen as a side effect of Dupixent.  The one case report does not prove causality.  She has other underlying risk factors including HTN and high cholesterol leading to small vessel disease, which is what needs to be addressed by her PCP to prevent future cerebrovascular incidents.  I advised her to continue [place] as prescribed, especially since it is working very well in controlling her skin disease.  She was advised to call for an appointment if she has any other concerns, otherwise I will see her as scheduled in August 22.

## 2020-12-10 DIAGNOSIS — M545 Low back pain, unspecified: Secondary | ICD-10-CM | POA: Diagnosis not present

## 2020-12-10 DIAGNOSIS — G2581 Restless legs syndrome: Secondary | ICD-10-CM | POA: Diagnosis not present

## 2020-12-10 DIAGNOSIS — Z8673 Personal history of transient ischemic attack (TIA), and cerebral infarction without residual deficits: Secondary | ICD-10-CM | POA: Diagnosis not present

## 2020-12-10 DIAGNOSIS — E538 Deficiency of other specified B group vitamins: Secondary | ICD-10-CM | POA: Diagnosis not present

## 2020-12-20 DIAGNOSIS — G4733 Obstructive sleep apnea (adult) (pediatric): Secondary | ICD-10-CM | POA: Diagnosis not present

## 2020-12-23 ENCOUNTER — Other Ambulatory Visit: Payer: Self-pay | Admitting: Family Medicine

## 2020-12-23 DIAGNOSIS — Z1231 Encounter for screening mammogram for malignant neoplasm of breast: Secondary | ICD-10-CM

## 2020-12-26 DIAGNOSIS — I671 Cerebral aneurysm, nonruptured: Secondary | ICD-10-CM | POA: Diagnosis not present

## 2020-12-31 DIAGNOSIS — J432 Centrilobular emphysema: Secondary | ICD-10-CM | POA: Diagnosis not present

## 2021-01-01 DIAGNOSIS — I10 Essential (primary) hypertension: Secondary | ICD-10-CM

## 2021-01-01 MED ORDER — HYDROCHLOROTHIAZIDE 25 MG PO TABS: 25.0000 mg | ORAL_TABLET | Freq: Every day | ORAL | 3 refills | Status: DC

## 2021-01-01 NOTE — Addendum Note (Signed)
Addended by: Olin Hauser on: 01/01/2021 01:31 PM   Modules accepted: Orders

## 2021-01-07 ENCOUNTER — Other Ambulatory Visit: Payer: Self-pay

## 2021-01-07 ENCOUNTER — Ambulatory Visit
Admission: RE | Admit: 2021-01-07 | Discharge: 2021-01-07 | Disposition: A | Discharge status: Home / Self Care | Payer: BC Managed Care – PPO | Source: Ambulatory Visit | Attending: Family Medicine | Admitting: Family Medicine

## 2021-01-07 DIAGNOSIS — Z1231 Encounter for screening mammogram for malignant neoplasm of breast: Secondary | ICD-10-CM | POA: Diagnosis not present

## 2021-01-07 IMAGING — MG MM DIGITAL SCREENING BILAT W/ TOMO AND CAD
8 series · 8 of 24 positions shown · non-contrast
Comparison: Previous exam(s).

CLINICAL DATA: Screening.

EXAM:
DIGITAL SCREENING BILATERAL MAMMOGRAM WITH TOMOSYNTHESIS AND CAD
TECHNIQUE: Bilateral screening digital craniocaudal and mediolateral oblique
mammograms were obtained. Bilateral screening digital breast
tomosynthesis was performed. The images were evaluated with
computer-aided detection.

[R MLO synth-2D]
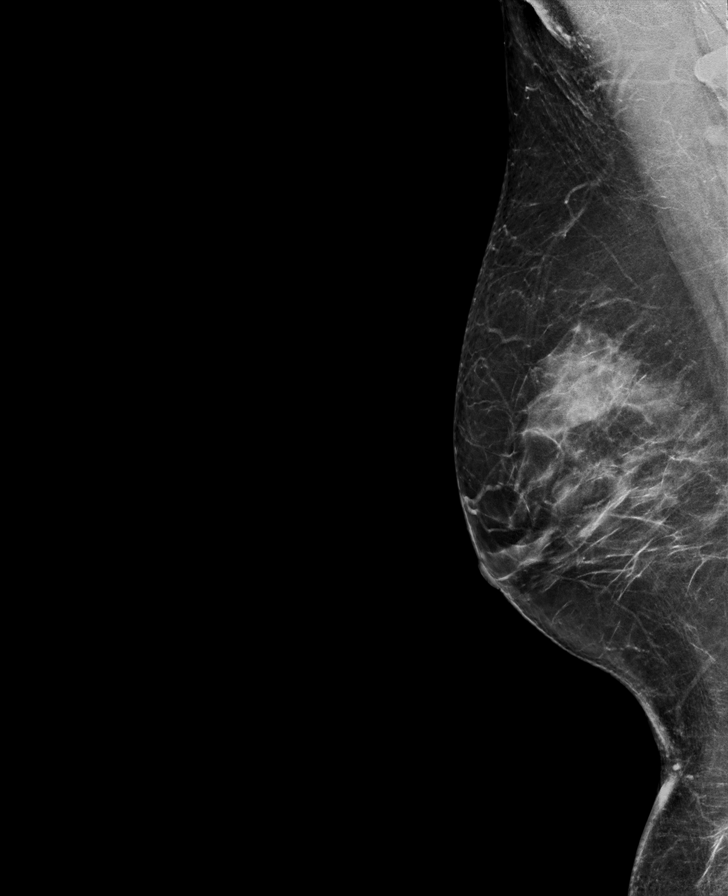

[L CC synth-2D]
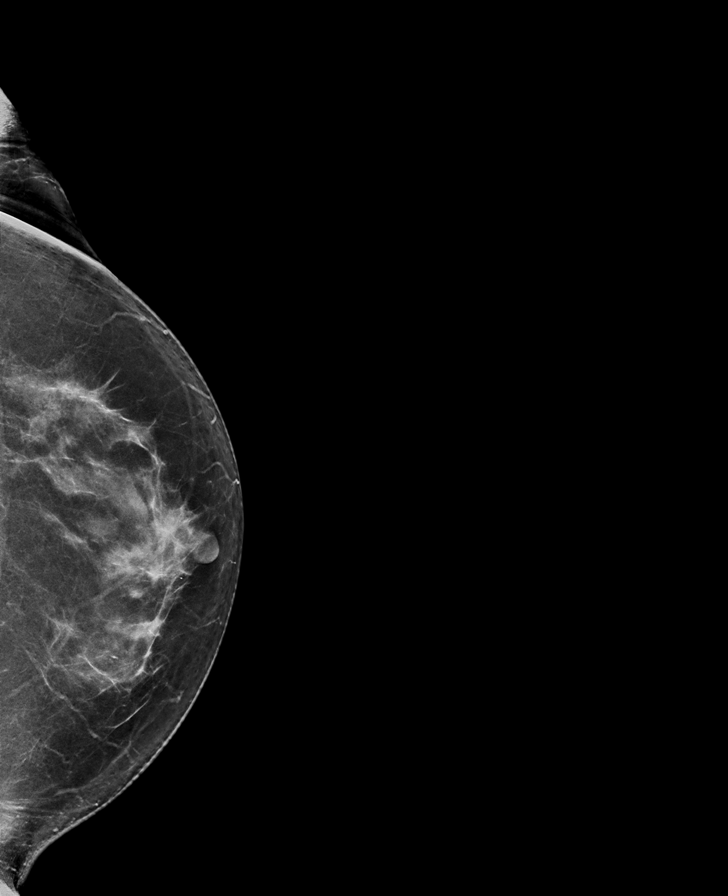

[R CC synth-2D]
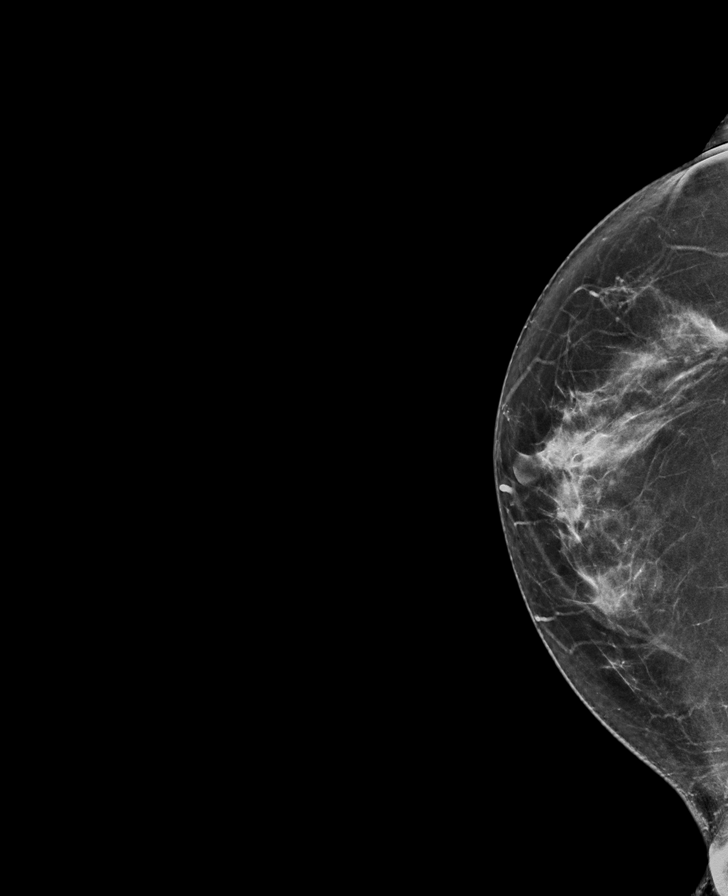

[L MLO synth-2D]
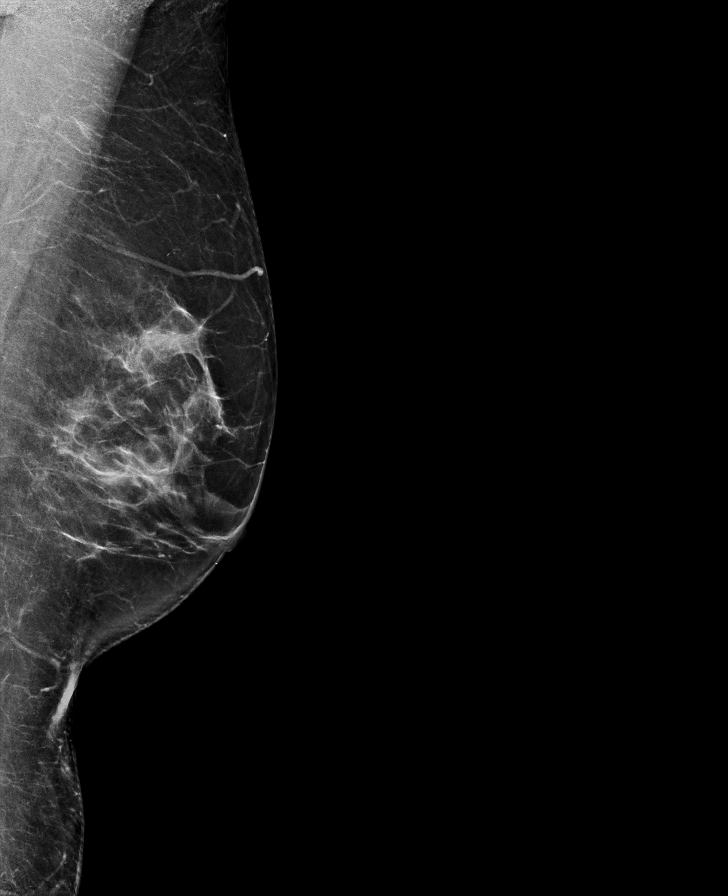

[R CC tomo · tomo slice 35/69.0]
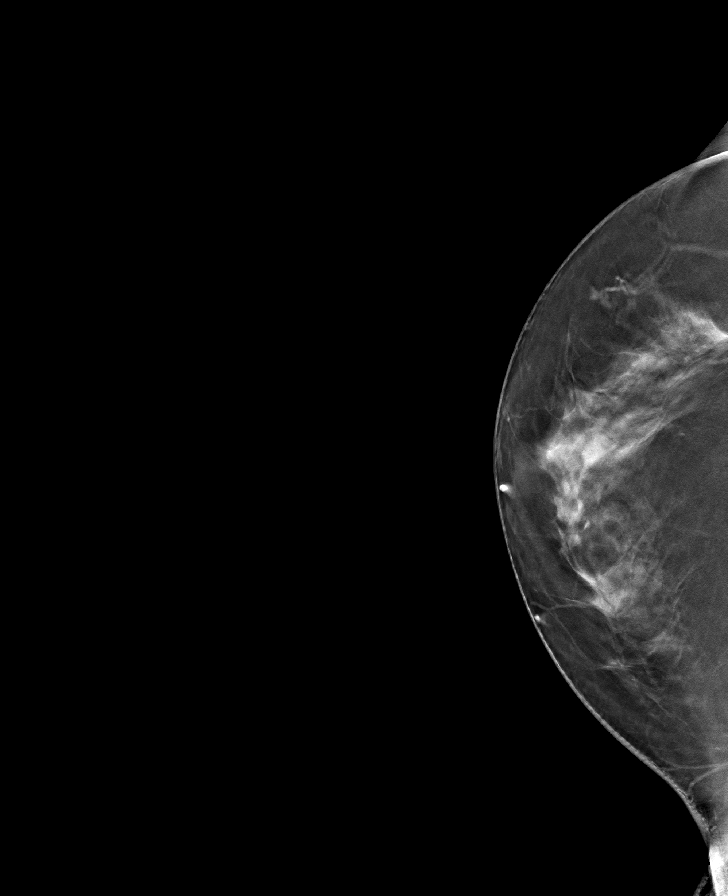

[L CC tomo · tomo slice 37/74.0]
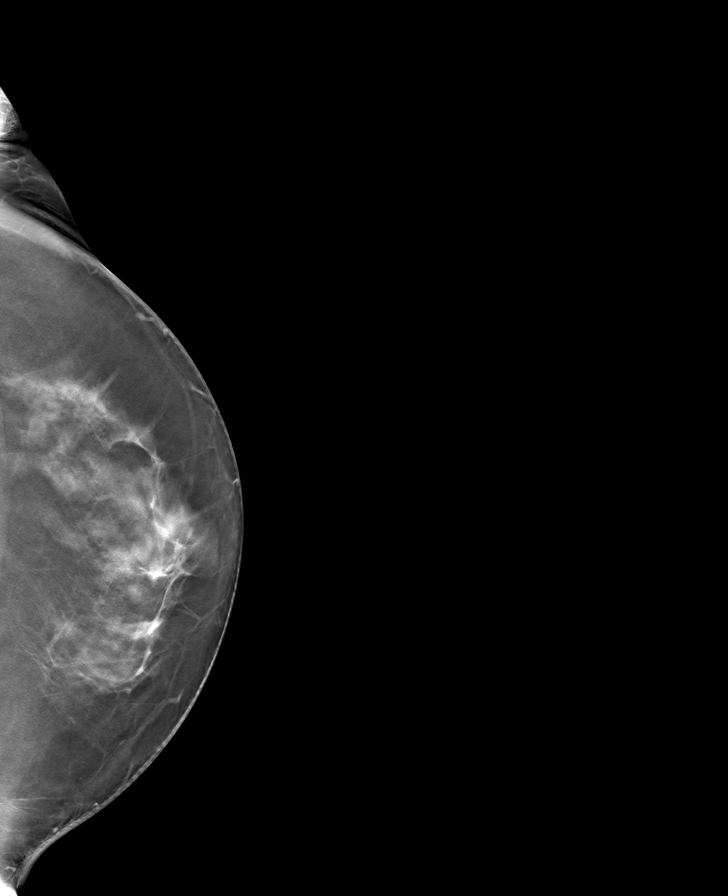

[L MLO tomo · tomo slice 37/73.0]
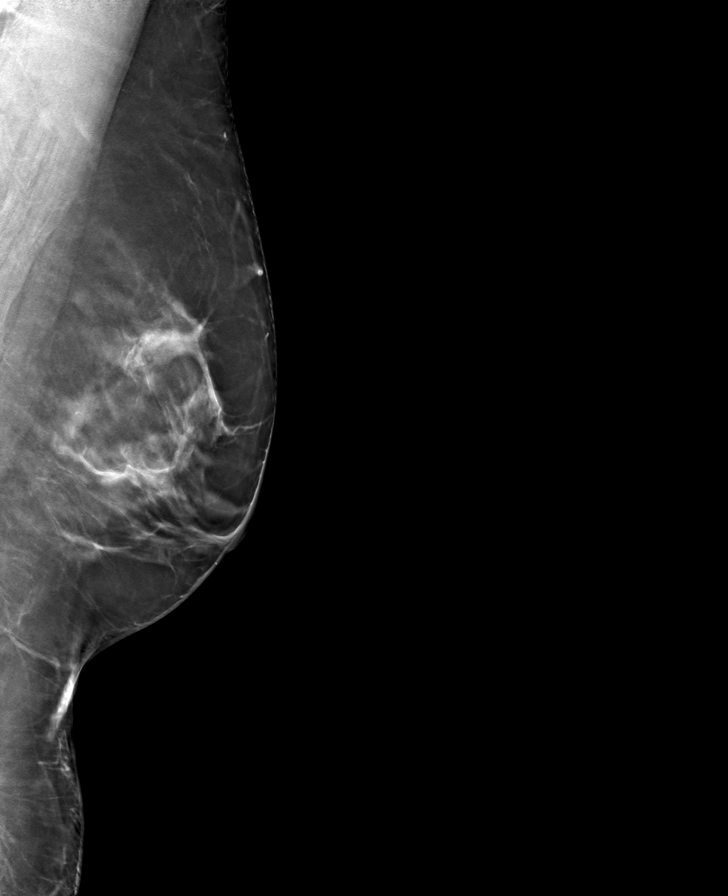

[R MLO tomo · tomo slice 37/72.0]
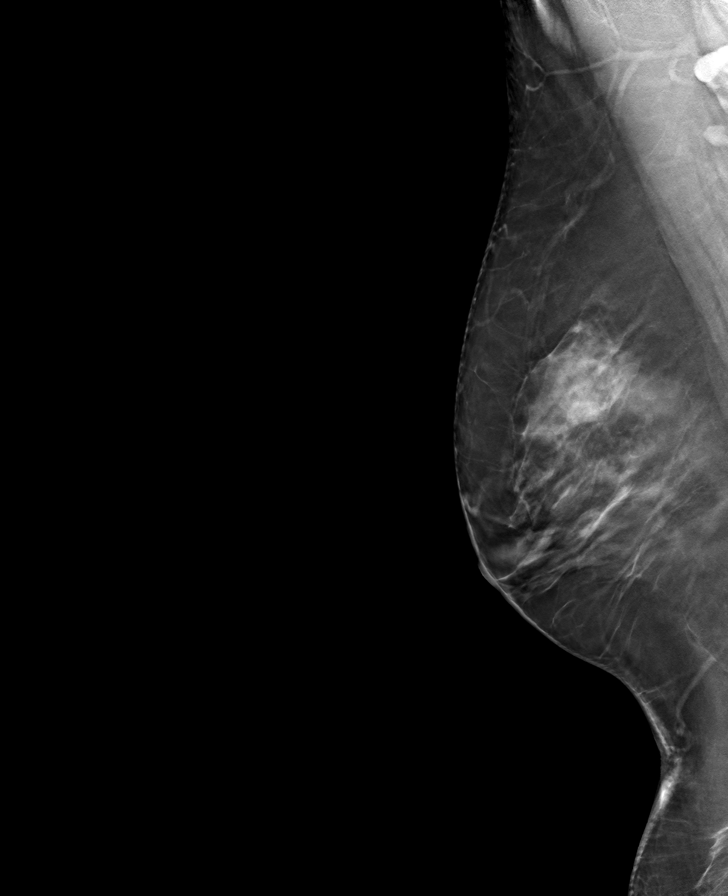

[8 of 24 positions shown; findings below may reference images not displayed]

ACR Breast Density Category c: The breast tissue is heterogeneously
dense, which may obscure small masses.
FINDINGS: There are no findings suspicious for malignancy. The images were
evaluated with computer-aided detection.
IMPRESSION: No mammographic evidence of malignancy. A result letter of this
screening mammogram will be mailed directly to the patient.

RECOMMENDATION:
Screening mammogram in one year. (Code:[6J])

BI-RADS CATEGORY  1: Negative.

## 2021-01-15 ENCOUNTER — Other Ambulatory Visit: Payer: Self-pay

## 2021-01-15 DIAGNOSIS — E039 Hypothyroidism, unspecified: Secondary | ICD-10-CM

## 2021-01-15 DIAGNOSIS — I639 Cerebral infarction, unspecified: Secondary | ICD-10-CM | POA: Diagnosis not present

## 2021-01-15 MED ORDER — LEVOTHYROXINE SODIUM 88 MCG PO TABS: 88.0000 ug | ORAL_TABLET | Freq: Every day | ORAL | 1 refills | Status: DC

## 2021-01-17 ENCOUNTER — Ambulatory Visit: Payer: BC Managed Care – PPO | Admitting: Family Medicine

## 2021-01-28 ENCOUNTER — Other Ambulatory Visit: Payer: BC Managed Care – PPO

## 2021-01-28 DIAGNOSIS — I10 Essential (primary) hypertension: Secondary | ICD-10-CM | POA: Diagnosis not present

## 2021-01-29 LAB — BASIC METABOLIC PANEL WITH GFR
BUN: 16 mg/dL (ref 7–25)
CO2: 27 mmol/L (ref 20–32)
Calcium: 9.6 mg/dL (ref 8.6–10.4)
Chloride: 101 mmol/L (ref 98–110)
Creat: 0.99 mg/dL (ref 0.50–0.99)
GFR, Est African American: 71 mL/min/{1.73_m2} (ref >=60)
GFR, Est Non African American: 61 mL/min/{1.73_m2} (ref >=60)
Glucose, Bld: 108 mg/dL — ABNORMAL HIGH (ref 65–99)
Potassium: 3.8 mmol/L (ref 3.5–5.3)
Sodium: 138 mmol/L (ref 135–146)

## 2021-01-31 ENCOUNTER — Encounter: Payer: Self-pay | Admitting: Family Medicine

## 2021-01-31 ENCOUNTER — Ambulatory Visit (INDEPENDENT_AMBULATORY_CARE_PROVIDER_SITE_OTHER): Payer: BC Managed Care – PPO | Admitting: Family Medicine

## 2021-01-31 ENCOUNTER — Other Ambulatory Visit: Payer: Self-pay

## 2021-01-31 VITALS — BP 135/84 | HR 108 | Ht 62.0 in | Wt 175.6 lb

## 2021-01-31 DIAGNOSIS — E782 Mixed hyperlipidemia: Secondary | ICD-10-CM

## 2021-01-31 DIAGNOSIS — I6930 Unspecified sequelae of cerebral infarction: Secondary | ICD-10-CM

## 2021-01-31 DIAGNOSIS — R7309 Other abnormal glucose: Secondary | ICD-10-CM

## 2021-01-31 DIAGNOSIS — I10 Essential (primary) hypertension: Secondary | ICD-10-CM

## 2021-01-31 DIAGNOSIS — Z780 Asymptomatic menopausal state: Secondary | ICD-10-CM | POA: Diagnosis not present

## 2021-01-31 DIAGNOSIS — F5101 Primary insomnia: Secondary | ICD-10-CM

## 2021-01-31 NOTE — Progress Notes (Signed)
Subjective:    Patient ID: Sherry Oliver, female    DOB: Apr 25, 1961, 60 y.o.   MRN: [id number]  Sherry Oliver is a 60 y.o. female presenting on 01/31/2021 for Hypertension and Cerebrovascular Accident   HPI   HTN Tachycardia History CVA PreDM  Updates on MyChart She developed swelling on Amlodipine 5mg . MyChart message 01/01/21 about her swelling, she was switched to HCTZ 25mg  daily and taken off Amlodipine.  She has seen Neuro, Neurosurgery, Cardiology, and Pulmonology since last visit with me in 11/2020.  Recently with Cardiology [facility] Dr Ubaldo Glassing 01/15/21 - had heart rate monitor Holter placed, looking for AFib. Chronic history of Tachycardia, even since age 66s.  Recently update from Dr Manuella Ghazi, they have turned her loose as far as blood pressure goes.  She has history of RLS, and taking medication, still has poor sleep and fatigue and poor tired.  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  Pulmonology, could not find cause of shortness of breath, former smoker for years, but non smoker, no abnormal finding there. Also dx with mild emphysema, nothing else to treat.  Menopause at age 46. Now age 78.  Insomnia Her usual circadian rhythm is to sleep at 2am and get up later Already has a CPAP for OSA Difficulty falling asleep and staying asleep Never on rx medication On med for RLS but still cant sleep Feels more fatigued and tired sleepy  Mother has osteoporosis and is on injection. - asks about DEXA   Depression screen [facility] 2/9 01/31/2021 04/19/2020 11/17/2019  Decreased Interest 2 0 0  Down, Depressed, Hopeless 1 0 0  PHQ - 2 Score 3 0 0  Altered sleeping 2 - -  Tired, decreased energy 3 - -  Change in appetite 0 - -  Feeling bad or failure about yourself  1 - -  Trouble concentrating 1 - -  Moving slowly or fidgety/restless 0 - -  Suicidal  thoughts 0 - -  PHQ-9 Score 10 - -  Difficult doing work/chores Somewhat difficult - -    Social History   Tobacco Use  . Smoking status: Former Smoker    Types: Cigarettes    Quit date: 03/14/2018    Years since quitting: 2.8  . Smokeless tobacco: Former Network engineer  . Vaping Use: Never used  Substance Use Topics  . Alcohol use: Yes    Comment: rare  . Drug use: Not Currently    Review of Systems Per HPI unless specifically indicated above     Objective:    BP 135/84   Pulse (!) 108   Ht 5\' 2"  (1.575 m)   Wt 175 lb 9.6 oz (79.7 kg)   SpO2 95%   BMI 32.12 kg/m   Wt Readings from Last 3 Encounters:  01/31/21 175 lb 9.6 oz (79.7 kg)  12/06/20 173 lb (78.5 kg)  12/01/20 170 lb (77.1 kg)    Physical Exam Vitals and nursing note reviewed.  Constitutional:      General: She is not in acute distress.    Appearance: She is well-developed. She is not diaphoretic.     Comments: Well-appearing, comfortable, cooperative  HENT:     Head: Normocephalic and atraumatic.  Eyes:     General:        Right eye: No discharge.        Left  eye: No discharge.     Conjunctiva/sclera: Conjunctivae normal.  Neck:     Thyroid: No thyromegaly.  Cardiovascular:     Rate and Rhythm: Normal rate and regular rhythm.     Heart sounds: Normal heart sounds. No murmur heard.   Pulmonary:     Effort: Pulmonary effort is normal. No respiratory distress.     Breath sounds: Normal breath sounds. No wheezing or rales.  Musculoskeletal:        General: Normal range of motion.     Cervical back: Normal range of motion and neck supple.  Lymphadenopathy:     Cervical: No cervical adenopathy.  Skin:    General: Skin is warm and dry.     Findings: No erythema or rash.  Neurological:     Mental Status: She is alert and oriented to person, place, and time.  Psychiatric:        Behavior: Behavior normal.     Comments: Well groomed, good eye contact, normal speech and thoughts    Results  for orders placed or performed in visit on 09/32/35  BASIC METABOLIC PANEL WITH GFR  Result Value Ref Range   Glucose, Bld 108 (H) 65 - 99 mg/dL   BUN 16 7 - 25 mg/dL   Creat 0.99 0.50 - 0.99 mg/dL   GFR, Est Non African American 61 > OR = 60 mL/min/1.30m2   GFR, Est African American 71 > OR = 60 mL/min/1.20m2   BUN/Creatinine Ratio NOT APPLICABLE 6 - 22 (calc)   Sodium 138 135 - 146 mmol/L   Potassium 3.8 3.5 - 5.3 mmol/L   Chloride 101 98 - 110 mmol/L   CO2 27 20 - 32 mmol/L   Calcium 9.6 8.6 - 10.4 mg/dL      Assessment & Plan:   Problem List Items Addressed This Visit    Primary insomnia    Chronic insomnia Seems primary, with also RLS Already on CPAP for OSA On medication mirapex Still difficulty sleep onset and maintenance Trial Melatonin dose titration, sleep hygiene handout Next visit consider rx medication options will review       Mixed hyperlipidemia    On Pravastatin since prior visit Will return 6 wk to check fasting lipid to see progress back on Statin History of CVA, on statin for secondary risk reduction      Relevant Medications   amLODipine (NORVASC) 5 MG tablet   Other Relevant Orders   Lipid panel   History of cerebrovascular accident (CVA) with residual deficit    Followed by Neurology/Neurosurgery On Plavix, Statin      Relevant Orders   COMPLETE METABOLIC PANEL WITH GFR   Essential hypertension - Primary    Improved control on recent BP change Now on HCTZ 25mg  and off Amlodipine due to swelling LE - Home BP readings   Complication w/ CVA history    Plan:  1. Continue current BP regimen HCTZ 25mg  daily - chemistry reviewed, normal K Cr and Na. - Future reconsider add 2nd agent, discussed Beta Blocker for Tachycardia and BP she can review with Cardiology 2. Encourage improved lifestyle - low sodium diet, regular exercise 3. Continue monitor BP outside office, bring readings to next visit, if persistently >140/90 or new symptoms notify  office sooner      Relevant Medications   amLODipine (NORVASC) 5 MG tablet   Other Relevant Orders   COMPLETE METABOLIC PANEL WITH GFR   Elevated hemoglobin A1c    Mild increased A1c at 6.2  Encourage lifestyle recommendations      Relevant Orders   Hemoglobin A1c    Other Visit Diagnoses    Postmenopausal estrogen deficiency       Relevant Orders   DG Bone Density      No orders of the defined types were placed in this encounter.     Follow up plan: Return in about 6 weeks (around 03/14/2021) for 6 week fasting lab only then 1 week later Follow-up Cholesterol, HTN, Sleep, Sugar.  Future labs ordered for 03/14/21 CMET A1c Lipid  Nobie Putnam, DO [facility] 01/31/2021, 4:18 PM

## 2021-01-31 NOTE — Patient Instructions (Addendum)
Thank you for coming to the office today.  Keep on HCTZ 25mg  daily, it is working well for blood pressure, and chemistry result is normal.  Follow up with Dr Ubaldo Glassing cardiology and check in about the tachycardia heart rate. The heart monitor will look for afib or abnormal rhythm that could have caused the stroke.  Ask him about a "beta blocker medication" that can lower your heart rate and help control blood pressure.   Start Melatonin 1mg  nightly then gradually increase to 5mg  every night. Eventually max dose is 10mg .  Future we can consider other meds  Trazodone nightly sleep aid anti depressant Ambien/Zolpidem - sleep aid as needed.   Sleep Hygiene Recommendations to promote healthy sleep in all patients, especially if symptoms of insomnia are worsening. Due to the nature of sleep rhythms, if your body gets "out of rhythm", it may take some time before your sleep cycle can be "reset".  Please try to follow as many of the following tips as you can, usually there are only a few of these are the primary cause of the problem.  ?To reset your sleep rhythm, go to bed and get up at the same time every day ?Sleep only long enough to feel rested and then get out of bed ?Do not try to force yourself to sleep. If you can't sleep, get out of bed and try again later. ?Avoid naps during the day, unless excessively tired. The more sleeping during the day, then the less sleep your body needs at night.  ?Have coffee, tea, and other foods that have caffeine only in the morning ?Exercise several days a week, but not right before bed ?If you drink alcohol, prefer to have appropriate drink with one meal, but prefer to avoid alcohol in the evening, and bedtime ?If you smoke, avoid smoking, especially in the evening  ?Avoid watching TV or looking at phones, computers, or reading devices ("e-books") that give off light at least 30 minutes before bed. This artificial light sends "awake signals" to your brain  and can make it harder to fall asleep. ?Make your bedroom a comfortable place where it is easy to fall asleep: ? Put up shades or special blackout curtains to block light from outside. ? Use a white noise machine to block noise. ? Keep the temperature cool. ?Try your best to solve or at least address your problems before you go to bed ?Use relaxation techniques to manage stress. Ask your health care provider to suggest some techniques that may work well for you. These may include: ? Breathing exercises. ? Routines to release muscle tension. ? Visualizing peaceful scenes.  DUE for FASTING BLOOD WORKno food or drink after midnight before the lab appointment, only water or coffee without cream/sugar on the morning of)  SCHEDULE "Lab Only" visit in the morning at the clinic for lab draw in [place]   - Make sure Lab Only appointment is at about 1 week before your next appointment, so that results will be available  For Lab Results, once available within 2-3 days of blood draw, you can can log in to MyChart online to view your results and a brief explanation. Also, we can discuss results at next follow-up visit.  Please schedule a Follow-up Appointment to: Return in about 6 weeks (around 03/14/2021) for 6 week fasting lab only then 1 week later Follow-up Cholesterol, HTN, Sleep, Sugar.  If you have any other questions or concerns, please feel free to call the office or send  a message through [place]. You may also schedule an earlier appointment if necessary.  Additionally, you may be receiving a survey about your experience at our office within a few days to 1 week by e-mail or mail. We value your feedback.  Nobie Putnam, DO [place]

## 2021-02-01 DIAGNOSIS — F5101 Primary insomnia: Secondary | ICD-10-CM | POA: Insufficient documentation

## 2021-02-01 NOTE — Assessment & Plan Note (Signed)
Improved control on recent BP change Now on HCTZ 25mg  and off Amlodipine due to swelling LE - Home BP readings   Complication w/ CVA history    Plan:  1. Continue current BP regimen HCTZ 25mg  daily - chemistry reviewed, normal K Cr and Na. - Future reconsider add 2nd agent, discussed Beta Blocker for Tachycardia and BP she can review with Cardiology 2. Encourage improved lifestyle - low sodium diet, regular exercise 3. Continue monitor BP outside office, bring readings to next visit, if persistently >140/90 or new symptoms notify office sooner

## 2021-02-01 NOTE — Assessment & Plan Note (Signed)
On Pravastatin since prior visit Will return 6 wk to check fasting lipid to see progress back on Statin History of CVA, on statin for secondary risk reduction

## 2021-02-01 NOTE — Assessment & Plan Note (Signed)
Mild increased A1c at 6.2 Encourage lifestyle recommendations

## 2021-02-01 NOTE — Assessment & Plan Note (Signed)
Followed by Neurology/Neurosurgery On Plavix, Statin

## 2021-02-01 NOTE — Assessment & Plan Note (Signed)
Chronic insomnia Seems primary, with also RLS Already on CPAP for OSA On medication mirapex Still difficulty sleep onset and maintenance Trial Melatonin dose titration, sleep hygiene handout Next visit consider rx medication options will review

## 2021-02-03 ENCOUNTER — Encounter: Payer: BC Managed Care – PPO | Admitting: Occupational Therapy

## 2021-02-04 ENCOUNTER — Ambulatory Visit: Payer: BC Managed Care – PPO | Admitting: Physical Therapy

## 2021-02-05 DIAGNOSIS — E039 Hypothyroidism, unspecified: Secondary | ICD-10-CM

## 2021-02-05 MED ORDER — LIOTHYRONINE SODIUM 5 MCG PO TABS: 5.0000 ug | ORAL_TABLET | Freq: Every day | ORAL | 0 refills | Status: DC

## 2021-02-11 DIAGNOSIS — I6930 Unspecified sequelae of cerebral infarction: Secondary | ICD-10-CM | POA: Diagnosis not present

## 2021-03-07 ENCOUNTER — Other Ambulatory Visit (HOSPITAL_COMMUNITY): Payer: Self-pay | Admitting: Neurosurgery

## 2021-03-07 ENCOUNTER — Other Ambulatory Visit: Payer: Self-pay | Admitting: Neurosurgery

## 2021-03-07 DIAGNOSIS — I671 Cerebral aneurysm, nonruptured: Secondary | ICD-10-CM

## 2021-03-14 ENCOUNTER — Other Ambulatory Visit: Payer: Self-pay

## 2021-03-14 ENCOUNTER — Other Ambulatory Visit: Payer: BC Managed Care – PPO

## 2021-03-14 DIAGNOSIS — R7309 Other abnormal glucose: Secondary | ICD-10-CM

## 2021-03-14 DIAGNOSIS — I10 Essential (primary) hypertension: Secondary | ICD-10-CM

## 2021-03-14 DIAGNOSIS — E782 Mixed hyperlipidemia: Secondary | ICD-10-CM

## 2021-03-14 DIAGNOSIS — I6930 Unspecified sequelae of cerebral infarction: Secondary | ICD-10-CM

## 2021-03-14 NOTE — Progress Notes (Signed)
Ordered labs

## 2021-03-15 LAB — COMPREHENSIVE METABOLIC PANEL
AG Ratio: 1.3 (calc) (ref 1.0–2.5)
ALT: 28 U/L (ref 6–29)
AST: 22 U/L (ref 10–35)
Albumin: 4.3 g/dL (ref 3.6–5.1)
Alkaline phosphatase (APISO): 69 U/L (ref 37–153)
BUN: 22 mg/dL (ref 7–25)
CO2: 27 mmol/L (ref 20–32)
Calcium: 10.1 mg/dL (ref 8.6–10.4)
Chloride: 100 mmol/L (ref 98–110)
Creat: 0.99 mg/dL (ref 0.50–0.99)
Globulin: 3.3 g/dL (calc) (ref 1.9–3.7)
Glucose, Bld: 101 mg/dL — ABNORMAL HIGH (ref 65–99)
Potassium: 3.8 mmol/L (ref 3.5–5.3)
Sodium: 138 mmol/L (ref 135–146)
Total Bilirubin: 0.4 mg/dL (ref 0.2–1.2)
Total Protein: 7.6 g/dL (ref 6.1–8.1)

## 2021-03-15 LAB — LIPID PANEL
Cholesterol: 229 mg/dL — ABNORMAL HIGH (ref <200)
HDL: 41 mg/dL — ABNORMAL LOW (ref >=50)
LDL Cholesterol (Calc): 144 mg/dL (calc) — ABNORMAL HIGH
Non-HDL Cholesterol (Calc): 188 mg/dL (calc) — ABNORMAL HIGH (ref <130)
Total CHOL/HDL Ratio: 5.6 (calc) — ABNORMAL HIGH (ref <5.0)
Triglycerides: 285 mg/dL — ABNORMAL HIGH (ref <150)

## 2021-03-15 LAB — HEMOGLOBIN A1C
Hgb A1c MFr Bld: 5.9 % of total Hgb — ABNORMAL HIGH (ref <5.7)
Mean Plasma Glucose: 123 mg/dL
eAG (mmol/L): 6.8 mmol/L

## 2021-03-20 ENCOUNTER — Other Ambulatory Visit: Payer: Self-pay

## 2021-03-20 ENCOUNTER — Ambulatory Visit
Admission: RE | Admit: 2021-03-20 | Discharge: 2021-03-20 | Disposition: A | Discharge status: Home / Self Care | Payer: BC Managed Care – PPO | Source: Ambulatory Visit | Attending: Neurosurgery | Admitting: Neurosurgery

## 2021-03-20 DIAGNOSIS — I671 Cerebral aneurysm, nonruptured: Secondary | ICD-10-CM | POA: Diagnosis not present

## 2021-03-20 DIAGNOSIS — I639 Cerebral infarction, unspecified: Secondary | ICD-10-CM | POA: Diagnosis not present

## 2021-03-20 IMAGING — MR MR MRA HEAD W/O CM
1 series · 19 of 48 positions shown · non-contrast
Comparison: Prior MRI from [DATE].

CLINICAL DATA: Three-month follow-up exam for stroke and aneurysm.

EXAM:
MRA HEAD WITHOUT CONTRAST
TECHNIQUE: Angiographic images of the Circle of Willis were acquired using MRA
technique without intravenous contrast.

[Series 5: TOF · axial · 0.5mm · 0.41mm/px · z∈[-122,-25]mm · 19 of 205 slices shown]
[im 1/205]
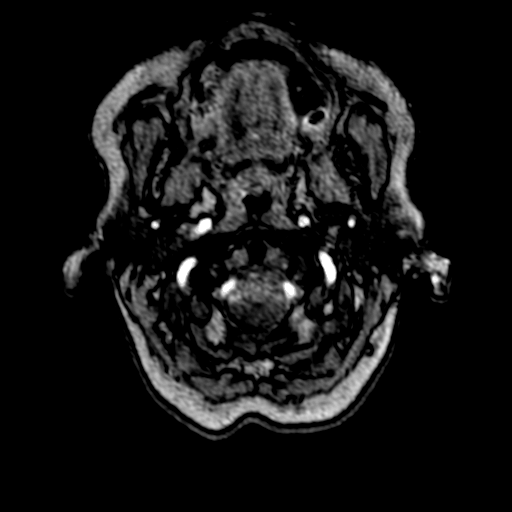
[im 5/205]
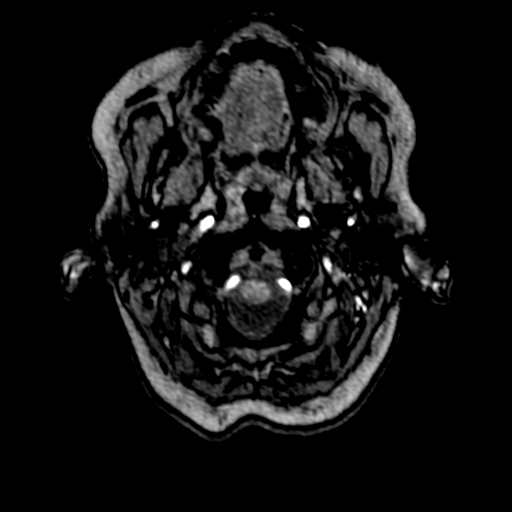
[im 9/205]
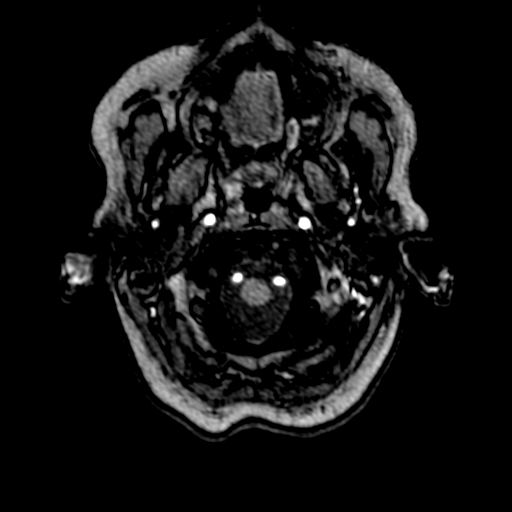
[im 14/205]
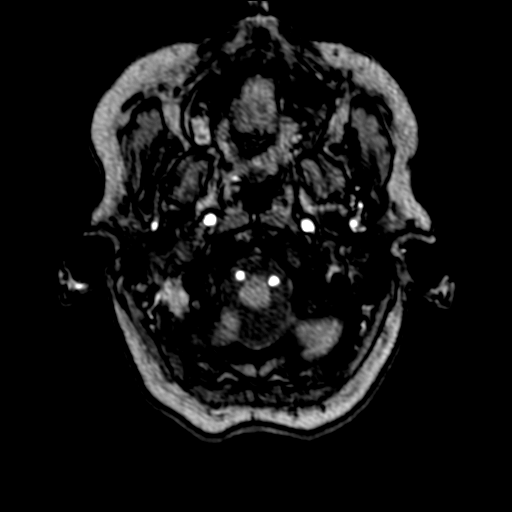
[im 18/205]
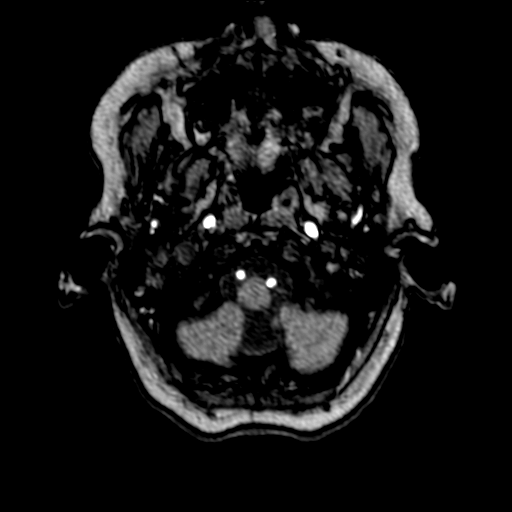
[im 22/205]
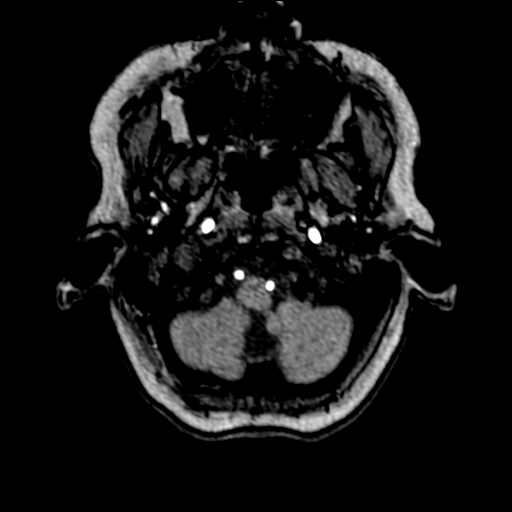
[im 27/205]
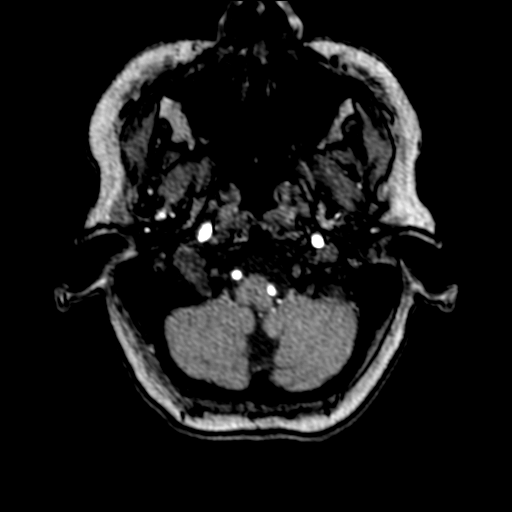
[im 31/205]
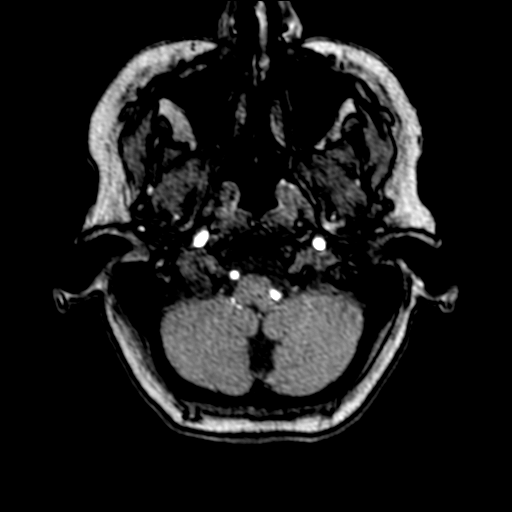
[im 35/205]
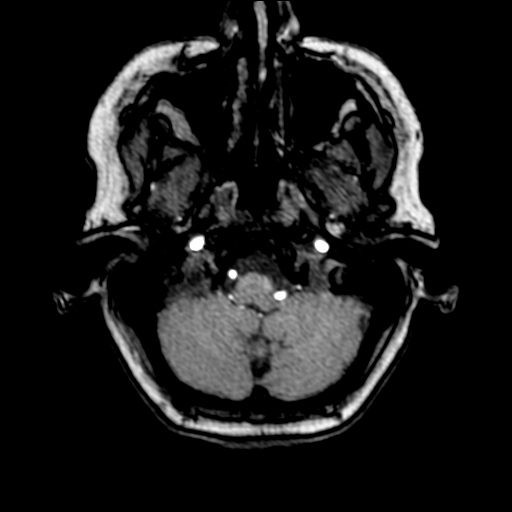
[im 40/205]
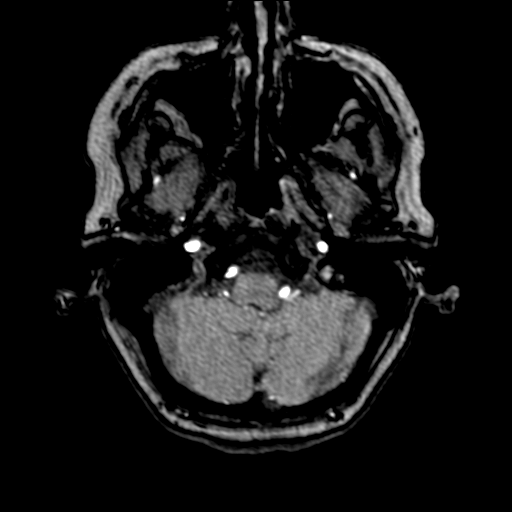
[im 44/205]
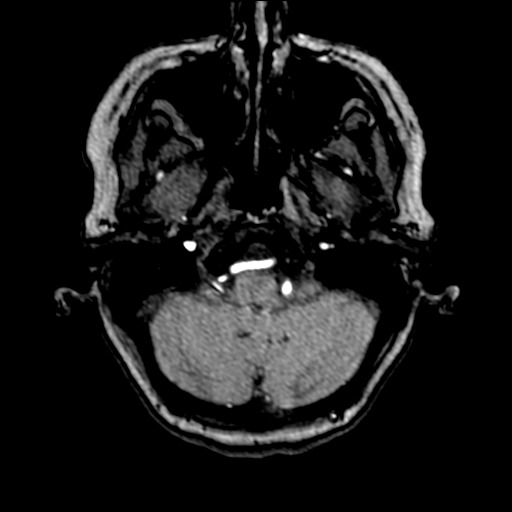
[im 66/205]
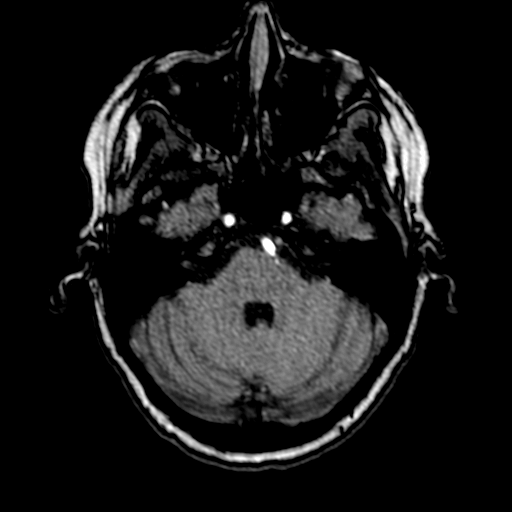
[im 92/205]
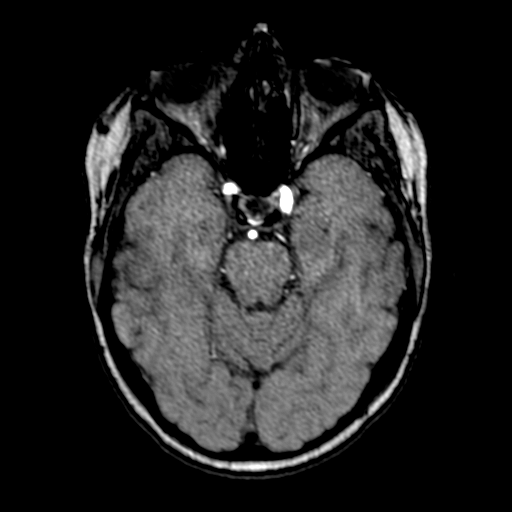
[im 105/205]
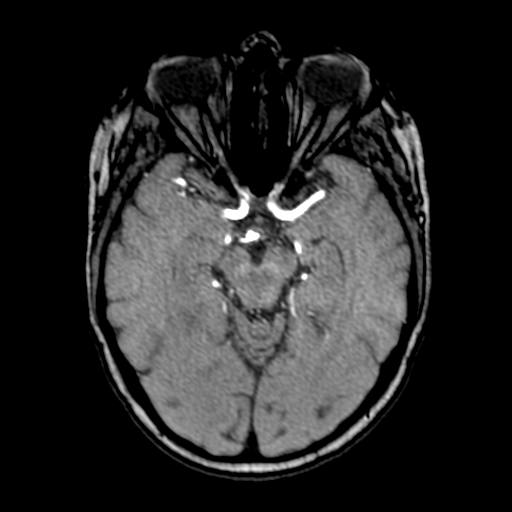
[im 118/205]
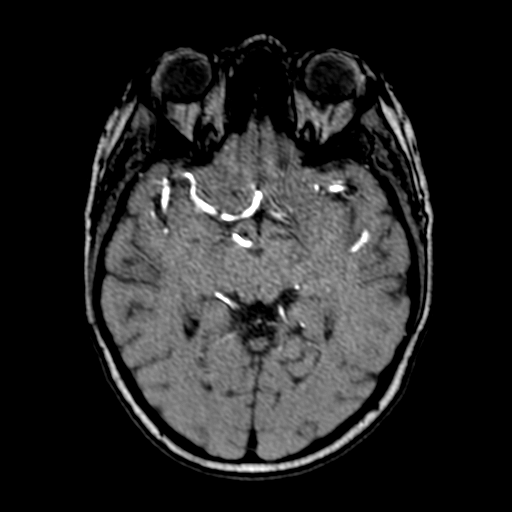
[im 144/205]
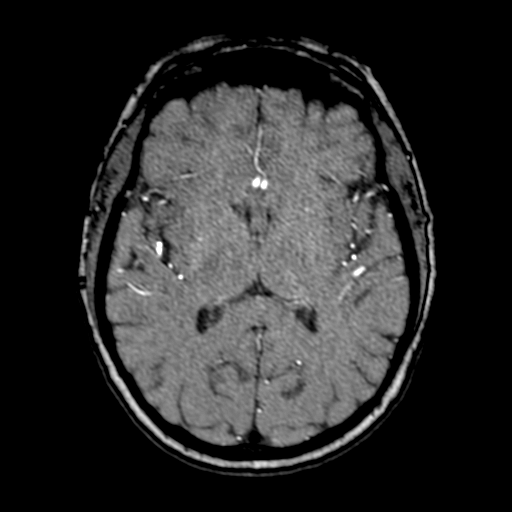
[im 170/205]
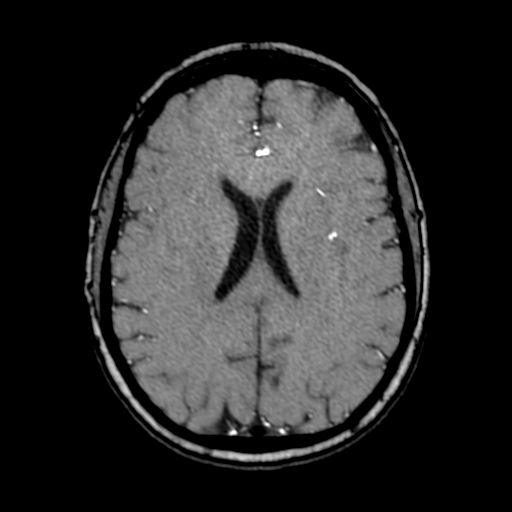
[im 174/205]
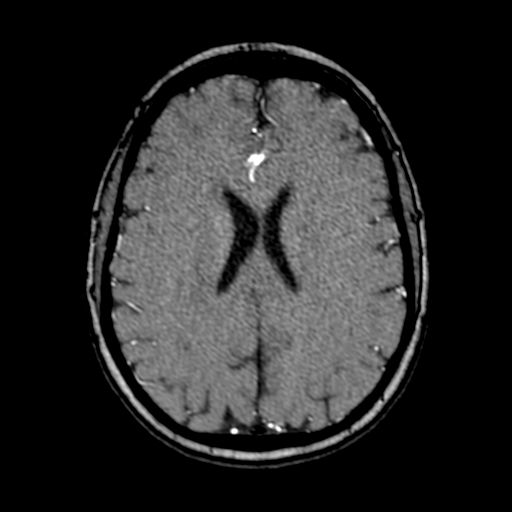
[im 196/205]
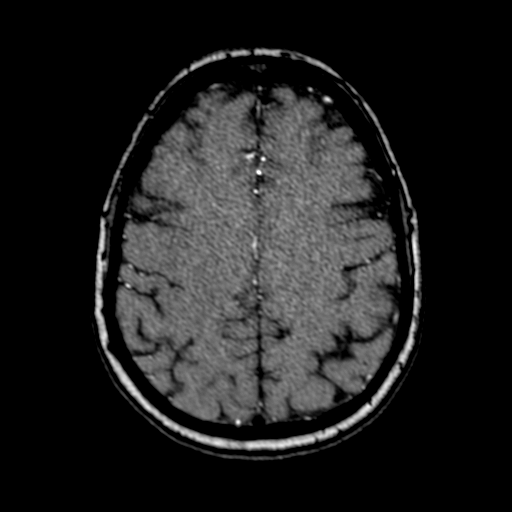

[19 of 48 positions shown; findings below may reference images not displayed]

FINDINGS: Anterior circulation: Examination mildly degraded by motion
artifact.

Distal cervical segments of the internal carotid arteries are patent
with antegrade flow. Petrous segments patent bilaterally. Cavernous
and supraclinoid segments remain patent without hemodynamically
significant stenosis. Again seen is a subtle 3 mm outpouching
extending inferiorly from the cavernous left ICA (series 5, image
88), indeterminate, but could reflect a small aneurysm versus
vascular infundibulum. This is also seen on MIP reconstructions
(series [XR], image 17). A1 segments remain patent. Normal anterior
communicating artery complex. Anterior cerebral arteries patent to
their distal aspects without stenosis. No M1 stenosis or occlusion.
Normal MCA bifurcations. Distal MCA branches well perfused and
symmetric.

Posterior circulation: Both V4 segments patent to the
vertebrobasilar junction without stenosis. Both PICA origins patent
and normal. Basilar patent to its distal aspect without stenosis.
Superior cerebellar arteries patent bilaterally. Both PCAs primarily
supplied via the basilar well perfused to their distal aspects.

Anatomic variants: None significant.

Other: None.
IMPRESSION: 1. Stable intracranial MRA. No large vessel occlusion or
hemodynamically significant/correctable stenosis.
2. 3 mm outpouching extending inferiorly from the cavernous left
ICA, stable, and could reflect a small aneurysm versus vascular
infundibulum.

## 2021-03-21 ENCOUNTER — Ambulatory Visit: Payer: BC Managed Care – PPO | Admitting: Family Medicine

## 2021-03-24 ENCOUNTER — Other Ambulatory Visit: Payer: Self-pay | Admitting: Family Medicine

## 2021-03-24 ENCOUNTER — Ambulatory Visit: Payer: BC Managed Care – PPO | Admitting: Family Medicine

## 2021-03-24 ENCOUNTER — Other Ambulatory Visit: Payer: Self-pay

## 2021-03-24 ENCOUNTER — Encounter: Payer: Self-pay | Admitting: Family Medicine

## 2021-03-24 VITALS — BP 131/73 | HR 91 | Ht 62.5 in | Wt 173.2 lb

## 2021-03-24 DIAGNOSIS — I10 Essential (primary) hypertension: Secondary | ICD-10-CM

## 2021-03-24 DIAGNOSIS — I6930 Unspecified sequelae of cerebral infarction: Secondary | ICD-10-CM

## 2021-03-24 DIAGNOSIS — E039 Hypothyroidism, unspecified: Secondary | ICD-10-CM

## 2021-03-24 DIAGNOSIS — Z1159 Encounter for screening for other viral diseases: Secondary | ICD-10-CM

## 2021-03-24 DIAGNOSIS — E782 Mixed hyperlipidemia: Secondary | ICD-10-CM

## 2021-03-24 DIAGNOSIS — Z0000 Encounter for general adult medical examination without abnormal findings: Secondary | ICD-10-CM

## 2021-03-24 DIAGNOSIS — R7309 Other abnormal glucose: Secondary | ICD-10-CM | POA: Diagnosis not present

## 2021-03-24 NOTE — Assessment & Plan Note (Signed)
Prior failed statins - Simvastatin, Atorvastatin, Rosuvastatin, Pitavastatin Improved lipid panel back on statin Pravastatin Tolerating fairly well, also on CoQ10 now can go up to 200mg  dailiy Counseling today on Repatha PCSK9 inhibitor option she will look into this  History of CVA, on statin for secondary risk reduction

## 2021-03-24 NOTE — Patient Instructions (Addendum)
Thank you for coming to the office today.  Repatha - look into this option either pen injection every 2 weeks, or the autoinject thing that attaches to your leg.  We may be able to get it covered by insurance. Or through a program.  May go up to CoQ10 200mg  or one and a half  Increase melatonin to 5 mg or higher if need.  DUE for FASTING BLOOD WORK (no food or drink after midnight before the lab appointment, only water or coffee without cream/sugar on the morning of)  SCHEDULE "Lab Only" visit in the morning at the clinic for lab draw in  3 MONTHS   - Make sure Lab Only appointment is at about 1 week before your next appointment, so that results will be available  For Lab Results, once available within 2-3 days of blood draw, you can can log in to MyChart online to view your results and a brief explanation. Also, we can discuss results at next follow-up visit.   Please schedule a Follow-up Appointment to: Return in about 3 months (around 06/24/2021) for 3 month fasting lab only then 1 week later Annual Physical.  If you have any other questions or concerns, please feel free to call the office or send a message through [place]. You may also schedule an earlier appointment if necessary.  Additionally, you may be receiving a survey about your experience at our office within a few days to 1 week by e-mail or mail. We value your feedback.  Nobie Putnam, DO [place]

## 2021-03-24 NOTE — Assessment & Plan Note (Signed)
Improved a1c to 5.9

## 2021-03-24 NOTE — Progress Notes (Signed)
Subjective:    Patient ID: Sherry Oliver, female    DOB: Mar 28, 1961, 60 y.o.   MRN: [id number]  Sherry Oliver is a 60 y.o. female presenting on 03/24/2021 for Hypertension and Hypothyroidism   HPI  Updates in interval since last visit.  Cardiology - good results, in follow-up, holter monitor for eval has been reviewed.  Neurosurgery - aneurysm stable, on MRI, no further follow-up  Neurology - next follow up on 04/17/21. Awaiting August PT, and they can also evaluate low back to see if pain in left foot is from the spine/back.  Lab results 03/14/21, significant improvement overall. A1c down to 5.9, and LDL cholesterol to 144  Continues on Pravastatin 20mg  dosing taking CoQ10 100mg  daily asking about dose  She has started new diet with low carb, limited fast food, and improving lifestyle.  She has lost 5 lbs.  Reduced Nexium  She has had muscle aches and myalgia  Insomnia Improved on Melatonin 4mg , but still has some episodes of insomnia.  Already has a CPAP for OSA Difficulty falling asleep and staying asleep Never on rx medication On med for RLS but still cant sleep Feels more fatigued and tired sleepy  Tachycardia History CVA  Depression screen [facility] 2/9 01/31/2021 04/19/2020 11/17/2019  Decreased Interest 2 0 0  Down, Depressed, Hopeless 1 0 0  PHQ - 2 Score 3 0 0  Altered sleeping 2 - -  Tired, decreased energy 3 - -  Change in appetite 0 - -  Feeling bad or failure about yourself  1 - -  Trouble concentrating 1 - -  Moving slowly or fidgety/restless 0 - -  Suicidal thoughts 0 - -  PHQ-9 Score 10 - -  Difficult doing work/chores Somewhat difficult - -    Social History   Tobacco Use   Smoking status: Former    Pack years: 0.00    Types: Cigarettes    Quit date: 03/14/2018    Years since quitting: 3.0   Smokeless tobacco: Former  Scientific laboratory technician Use: Never used  Substance Use Topics   Alcohol use: Yes    Comment: rare   Drug use: Not  Currently    Review of Systems Per HPI unless specifically indicated above     Objective:    BP 131/73   Pulse 91   Ht 5' 2.5" (1.588 m)   Wt 173 lb 3.2 oz (78.6 kg)   SpO2 98%   BMI 31.17 kg/m   Wt Readings from Last 3 Encounters:  03/24/21 173 lb 3.2 oz (78.6 kg)  01/31/21 175 lb 9.6 oz (79.7 kg)  12/06/20 173 lb (78.5 kg)    Physical Exam Vitals and nursing note reviewed.  Constitutional:      General: She is not in acute distress.    Appearance: Normal appearance. She is well-developed. She is not diaphoretic.     Comments: Well-appearing, comfortable, cooperative  HENT:     Head: Normocephalic and atraumatic.  Eyes:     General:        Right eye: No discharge.        Left eye: No discharge.     Conjunctiva/sclera: Conjunctivae normal.  Neck:     Thyroid: No thyromegaly.  Cardiovascular:     Rate and Rhythm: Normal rate and regular rhythm.     Heart sounds: Normal heart sounds. No murmur heard. Pulmonary:     Effort: Pulmonary effort is normal. No respiratory distress.  Breath sounds: Normal breath sounds. No wheezing or rales.  Musculoskeletal:        General: Normal range of motion.     Cervical back: Normal range of motion and neck supple.  Lymphadenopathy:     Cervical: No cervical adenopathy.  Skin:    General: Skin is warm and dry.     Findings: No erythema or rash.  Neurological:     Mental Status: She is alert and oriented to person, place, and time.  Psychiatric:        Mood and Affect: Mood normal.        Behavior: Behavior normal.        Thought Content: Thought content normal.     Comments: Well groomed, good eye contact, normal speech and thoughts      Results for orders placed or performed in visit on 03/14/21  HgB A1c  Result Value Ref Range   Hgb A1c MFr Bld 5.9 (H) <5.7 % of total Hgb   Mean Plasma Glucose 123 mg/dL   eAG (mmol/L) 6.8 mmol/L  Lipid panel  Result Value Ref Range   Cholesterol 229 (H) <200 mg/dL   HDL 41 (L)  > OR = 50 mg/dL   Triglycerides 285 (H) <150 mg/dL   LDL Cholesterol (Calc) 144 (H) mg/dL (calc)   Total CHOL/HDL Ratio 5.6 (H) <5.0 (calc)   Non-HDL Cholesterol (Calc) 188 (H) <130 mg/dL (calc)  Comprehensive Metabolic Panel (CMET)  Result Value Ref Range   Glucose, Bld 101 (H) 65 - 99 mg/dL   BUN 22 7 - 25 mg/dL   Creat 0.99 0.50 - 0.99 mg/dL   BUN/Creatinine Ratio NOT APPLICABLE 6 - 22 (calc)   Sodium 138 135 - 146 mmol/L   Potassium 3.8 3.5 - 5.3 mmol/L   Chloride 100 98 - 110 mmol/L   CO2 27 20 - 32 mmol/L   Calcium 10.1 8.6 - 10.4 mg/dL   Total Protein 7.6 6.1 - 8.1 g/dL   Albumin 4.3 3.6 - 5.1 g/dL   Globulin 3.3 1.9 - 3.7 g/dL (calc)   AG Ratio 1.3 1.0 - 2.5 (calc)   Total Bilirubin 0.4 0.2 - 1.2 mg/dL   Alkaline phosphatase (APISO) 69 37 - 153 U/L   AST 22 10 - 35 U/L   ALT 28 6 - 29 U/L      Assessment & Plan:   Problem List Items Addressed This Visit     Mixed hyperlipidemia - Primary    Prior failed statins - Simvastatin, Atorvastatin, Rosuvastatin, Pitavastatin Improved lipid panel back on statin Pravastatin Tolerating fairly well, also on CoQ10 now can go up to 200mg  dailiy Counseling today on Repatha PCSK9 inhibitor option she will look into this  History of CVA, on statin for secondary risk reduction       History of cerebrovascular accident (CVA) with residual deficit    Followed by Neurology/Neurosurgery On Plavix, Statin       Essential hypertension    Controlled - Home BP readings   Complication w/ CVA history  Off Amlodipine due to swelling  Plan:  1. Continue current BP regimen HCTZ 25mg  daily - chemistry reviewed, normal K Cr and Na. - Future reconsider add 2nd agent, discussed Beta Blocker for Tachycardia and BP she can review with Cardiology 2. Encourage improved lifestyle - low sodium diet, regular exercise 3. Continue monitor BP outside office, bring readings to next visit, if persistently >140/90 or new symptoms notify office  sooner  Elevated hemoglobin A1c    Improved a1c to 5.9        No orders of the defined types were placed in this encounter.     Follow up plan: Return in about 3 months (around 06/24/2021) for 3 month fasting lab only then 1 week later Annual Physical.  Future labs ordered for 06/17/21  Nobie Putnam, [facility] 03/24/2021, 3:16 PM

## 2021-03-24 NOTE — Assessment & Plan Note (Signed)
Controlled - Home BP readings   Complication w/ CVA history  Off Amlodipine due to swelling  Plan:  1. Continue current BP regimen HCTZ 25mg  daily - chemistry reviewed, normal K Cr and Na. - Future reconsider add 2nd agent, discussed Beta Blocker for Tachycardia and BP she can review with Cardiology 2. Encourage improved lifestyle - low sodium diet, regular exercise 3. Continue monitor BP outside office, bring readings to next visit, if persistently >140/90 or new symptoms notify office sooner

## 2021-03-24 NOTE — Assessment & Plan Note (Signed)
Followed by Neurology/Neurosurgery On Plavix, Statin

## 2021-04-15 ENCOUNTER — Ambulatory Visit: Payer: BC Managed Care – PPO | Admitting: Occupational Therapy

## 2021-04-15 ENCOUNTER — Ambulatory Visit: Payer: BC Managed Care – PPO | Attending: Neurology

## 2021-04-15 ENCOUNTER — Encounter: Payer: Self-pay | Admitting: Occupational Therapy

## 2021-04-15 ENCOUNTER — Other Ambulatory Visit: Payer: Self-pay

## 2021-04-15 DIAGNOSIS — M6281 Muscle weakness (generalized): Secondary | ICD-10-CM | POA: Diagnosis not present

## 2021-04-15 DIAGNOSIS — R278 Other lack of coordination: Secondary | ICD-10-CM

## 2021-04-15 DIAGNOSIS — I69351 Hemiplegia and hemiparesis following cerebral infarction affecting right dominant side: Secondary | ICD-10-CM | POA: Insufficient documentation

## 2021-04-15 DIAGNOSIS — R262 Difficulty in walking, not elsewhere classified: Secondary | ICD-10-CM | POA: Insufficient documentation

## 2021-04-15 DIAGNOSIS — M79672 Pain in left foot: Secondary | ICD-10-CM | POA: Insufficient documentation

## 2021-04-15 NOTE — Therapy (Signed)
[place] MAIN [facility] SERVICES [address] [place], [place], [address] Phone: [phone]   Fax:  [phone]  Occupational Therapy Evaluation  Patient Details  Name: NACHELLE NEGRETTE MRN: [id number] Date of Birth: 1961/01/14 Referring Provider (OT): Dr. Parks Ranger   Encounter Date: 04/15/2021   OT End of Session - 04/15/21 1625     Visit Number 1    Number of Visits 1    Date for OT Re-Evaluation 04/15/21    OT Start Time 1500    OT Stop Time 1600    OT Time Calculation (min) 60 min    Behavior During Therapy [facility] for tasks assessed/performed             Past Medical History:  Diagnosis Date   Allergy    Gastropathy    GERD (gastroesophageal reflux disease)    Headache    Hyperlipidemia    Hypertension    Hypothyroid    Lumbar radiculopathy    Lung nodule    Sleep apnea     Past Surgical History:  Procedure Laterality Date   APPENDECTOMY  2008   [place]   bladder stem   BREAST BIOPSY Left 06/06/2019   Lymph node US Bx, hydromarker. REACTIVE LYMPH NODE, FAVOR BENIGN   COLONOSCOPY WITH PROPOFOL N/A 02/19/2015   Procedure: COLONOSCOPY WITH PROPOFOL;  Surgeon: Lollie Sails, MD;  Location: [facility] ENDOSCOPY;  Service: Endoscopy;  Laterality: N/A;   COLONOSCOPY WITH PROPOFOL N/A 03/24/2018   Procedure: COLONOSCOPY WITH PROPOFOL;  Surgeon: Lollie Sails, MD;  Location: [facility] ENDOSCOPY;  Service: Endoscopy;  Laterality: N/A;   ELBOW SURGERY Right 1982   ESOPHAGOGASTRODUODENOSCOPY     FOOT SURGERY Left 2007   neuroma removal, repeat 2008    There were no vitals filed for this visit.   Subjective Assessment - 04/15/21 1509     Subjective  Pt. report that she retruned to work soon after  having her CVA.    Pertinent History Pt. is a 60 y.o. female who was admitted to [facility] in March with a CVA with dominant rightsided weakness, and incoordination. Pt. PMHx includes: OSA on CPAP, Restless Leg Syndrome,  Acquired Hypothroidism, Right elbow surgery s/p 40 years.    Currently in Pain? No/denies            OT TREATMENT  Pt. Education was provided about thearputty ex. For hand strengthening. Exercises included: gross gripping, gross digit extension, thumb abduction, lateral, and 3pt. Pinch strengthening, digit abduction, and thumb opposition. Pt. Was provided with a visual handout was provided through [place]. Pt. Was provided with a HEP for hand function, and [facility] tasks.   [facility] OT Assessment - 04/15/21 1512       Assessment   Medical Diagnosis 3/20/202    Referring Provider (OT) Dr. Parks Ranger    Onset Date/Surgical Date 12/01/20    Hand Dominance Right    Next MD Visit 4 weeks      Precautions   Precautions None      Restrictions   Weight Bearing Restrictions No      Balance Screen   Has the patient fallen in the past 6 months No    Has the patient had a decrease in activity level because of a fear of falling?  No    Is the patient reluctant to leave their home because of a fear of falling?  No      Home  Environment   Family/patient expects to  be discharged to: Private residence    Living Arrangements Other relatives    Type of [place] Two level    Alternate Level Stairs - Number of Steps 4    Bathroom Shower/Tub Tub/Shower unit    Shower/tub characteristics Curtain    Research scientist (medical) - 2 wheels;Cane - single point;Bedside commode;Hand held shower head    Lives With Family      Prior Function   Level of Independence Independent    Vocation Full time employment    Vocation Requirements Computers    Leisure Reading      ADL   Eating/Feeding Independent    Grooming Independent    Lower Body Bathing Independent    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 teacher -  Product/process development scientist Independent      IADL   Prior Level of  [place] care of all shopping needs independently    Prior Level of Function Light Housekeeping Independent    Light Housekeeping Does personal laundry completely    Prior Level of Function Meal Prep Independent    Meal Prep Plans, prepares and serves adequate meals independently    Prior Level of [place] own vehicle    Prior Level of Function Medication Managment Independent    Medication Management Is responsible for taking medication in correct dosages at correct time    Prior Level of Function Therapist, sports financial matters independently (budgets, writes checks, pays rent, bills goes to bank), collects and keeps track of income      Mobility   Mobility Status Independent      Written Expression   Dominant Hand Right    Handwriting 100% legible      Vision - History   Baseline Vision Wears glasses all the time      Cognition   Overall Cognitive Status Within Functional Limits for tasks assessed      Observation/Other Assessments   Focus on Therapeutic Outcomes (FOTO)  FOTO: 98      Coordination   Right 9 Hole Peg Test 19    Left 9 Hole Peg Test 20      AROM   Overall AROM Comments BUE AROM WNL      Strength   Overall Strength Comments BUE strength 5/5 overall      Hand Function   Right Hand Grip (lbs) 55    Right Hand Lateral Pinch 16 lbs    Right Hand 3 Point Pinch 15 lbs    Left Hand Grip (lbs) 45    Left Hand Lateral Pinch 13 lbs    Left 3 point pinch 17 lbs                             OT Education - 04/15/21 1624     Education Details HEP for strength, coordination    Person(s) Educated Patient    Methods Explanation    Comprehension Verbalized understanding                        Plan - 04/15/21 1629     Clinical Impression Statement Pt. is a  60  y.o. who was  diagnosed with a CVA with dominant right sided weakness, and incoordination. The weakness, and incoordination in her RUE has resolved. Pt.'s RUE, grip strength, pinch strength, FMC, and sensation are all WNL. Pt. is able to use her RUE to engage in all ADLs, and IADL tasks without difficulty. Pt.'s FOTO score is 98. Pt. was provided with education about HEP's for coordination, and strengthening with theraputty. Additional OT services are not warranted at this time. Pt. is appropriate for discharge from OT services at the time.    Occupational performance deficits (Please refer to evaluation for details): ADL's;IADL's;Leisure;Play    Rehab Potential Fair    Clinical Decision Making Several treatment options, min-mod task modification necessary    Comorbidities Affecting Occupational Performance: May have comorbidities impacting occupational performance    Modification or Assistance to Complete Evaluation  Min-Moderate modification of tasks or assist with assess necessary to complete eval    OT Frequency --    OT Duration --    OT Treatment/Interventions Therapeutic exercise;Neuromuscular education;Patient/family education    Consulted and Agree with Plan of Care Patient             Patient will benefit from skilled therapeutic intervention in order to improve the following deficits and impairments:           Visit Diagnosis: Muscle weakness (generalized)  Other lack of coordination    Problem List Patient Active Problem List   Diagnosis Date Noted   Primary insomnia 02/01/2021   History of cerebrovascular accident (CVA) with residual deficit 12/06/2020   Essential hypertension 12/06/2020   Elevated hemoglobin A1c 04/20/2020   Thrombocytosis 04/20/2020   OSA on CPAP 11/17/2019   Former smoker 11/17/2019   Drug-induced constipation 11/17/2019   Obesity (BMI 30.0-34.9) 11/17/2019   Acquired hypothyroidism 11/17/2019   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 [place], [place], [address] Phone: [phone]   Fax:  [phone]  Name: AOWYN ROZEBOOM MRN: [id number] Date of Birth: 02-Feb-1961

## 2021-04-15 NOTE — Therapy (Signed)
[place] MAIN [facility] SERVICES [address] [place], [place], [address] Phone: [phone]   Fax:  [phone]  Physical Therapy Evaluation  Patient Details  Name: Sherry Oliver MRN: [id number] Date of Birth: 04-Jul-1961 Referring Provider (PT): Jennings Books, MD (Neurology)   Encounter Date: 04/15/2021   PT End of Session - 04/15/21 1521     Visit Number 1    Number of Visits 1    Date for PT Re-Evaluation --   one time visit only   Authorization Type BCBS, 40VL anually PT/OT/Chiro    Authorization Time Period 04/15/21-05/13/21    PT Start Time 1420    PT Stop Time 1445    PT Time Calculation (min) 25 min    Equipment Utilized During Treatment Gait belt    Activity Tolerance Patient tolerated treatment well;No increased pain    Behavior During Therapy WFL for tasks assessed/performed             Past Medical History:  Diagnosis Date   Allergy    Gastropathy    GERD (gastroesophageal reflux disease)    Headache    Hyperlipidemia    Hypertension    Hypothyroid    Lumbar radiculopathy    Lung nodule    Sleep apnea     Past Surgical History:  Procedure Laterality Date   APPENDECTOMY  2008   [place]   bladder stem   BREAST BIOPSY Left 06/06/2019   Lymph node US Bx, hydromarker. REACTIVE LYMPH NODE, FAVOR BENIGN   COLONOSCOPY WITH PROPOFOL N/A 02/19/2015   Procedure: COLONOSCOPY WITH PROPOFOL;  Surgeon: Lollie Sails, MD;  Location: [facility] ENDOSCOPY;  Service: Endoscopy;  Laterality: N/A;   COLONOSCOPY WITH PROPOFOL N/A 03/24/2018   Procedure: COLONOSCOPY WITH PROPOFOL;  Surgeon: Lollie Sails, MD;  Location: [facility] ENDOSCOPY;  Service: Endoscopy;  Laterality: N/A;   ELBOW SURGERY Right 1982   ESOPHAGOGASTRODUODENOSCOPY     FOOT SURGERY Left 2007   neuroma removal, repeat 2008    There were no vitals filed for this visit.    Subjective Assessment - 04/15/21 1502     Subjective Pt presenting to OPPT  for evaluation after CVA in March 2022.    Pertinent History Sherry Oliver is a 72yoF who sustained a thalamic CVA in March 2022, initially with weakness and incoordination deficits of Rt hemibody. PT evaluation in ED recommended OPPT services. Pt has not had any therapy services since DC from hospital, but has been awaiting scheduling for this evlauation. Pt has been working on self-guided fine motor coordination with childrens hand-writing activity books that she bought. Pt reports at this date, all appreciable deficits have resolved, pt back at baseline. Pt denies any recent falls, acute imbalance, asymmetrical weakness or changes to sensorium.    Currently in Pain? No/denies   chronic foot pain               [facility] PT Assessment - 04/15/21 0001       Assessment   Medical Diagnosis s/p CVA c Rt hemi involvement    Referring Provider (PT) Jennings Books, MD   Neurology   Onset Date/Surgical Date --   March 2022   Hand Dominance Right    Next MD Visit None scheduled for this issue    Prior Therapy Acute care only      Precautions   Precautions None      Restrictions   Weight Bearing Restrictions No  Balance Screen   Has the patient fallen in the past 6 months No    Has the patient had a decrease in activity level because of a fear of falling?  No    Is the patient reluctant to leave their home because of a fear of falling?  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     Observation/Other Assessments   Focus on Therapeutic Outcomes ([place])  --   1 time visit only     ROM / Strength   AROM / PROM / Strength Strength   5/5 pain free BLE screening, no gross asymmetry     Special Tests    Special Tests  Non-Organic Signs    Other special tests 30sec chair rise: 15x   standard chair, nhands-free, no gross asymmetry     Ambulation/Gait   Ambulation Distance (Feet) 500 Feet    Assistive device None    Gait Pattern Within Functional Limits   Lt  trendelenburg (high velocity), no abberant trunk movement, mild RLE crossover (reports slight posterior hip pain in walking)   Ambulation Surface Level                 Objective measurements completed on examination: See above findings.               PT Education - 04/15/21 1520     Education Details No apparent CVA deficits; could potentially help with chronic foot pain    Person(s) Educated Patient    Methods Explanation    Comprehension Verbalized understanding                         Plan - 04/15/21 1523     Clinical Impression Statement Examination revealing of good symmetry in gait and transfers, MMT reveals no gross weakness, asymmetry, or pain. Pt denies any notable continued deficit, reports full resolution. 30sec chair rise test showing better than age-matched norm performance. No skilled PT services warranted at this time related to CVA in March. Pt does describe ongoing foot pain, dysfunction, AMB limitations which may benefit from further evaluation. Discussed reaching out to podiatry for referral, of which patient is agreeable, and will schedule FU evaluation for Left chronic foot pain and limited activity tolerance in AMB.     Personal Factors and Comorbidities Comorbidity 1;Time since onset of injury/illness/exacerbation    Comorbidities prior CVA (per imaging)    Stability/Clinical Decision Making Stable/Uncomplicated    Clinical Decision Making Low    Rehab Potential Excellent    PT Frequency --   evauation only, no treatment warranted   PT Treatment/Interventions ADLs/Self Care Home Management;Gait training    Consulted and Agree with Plan of Care Patient             Patient will  benefit from skilled therapeutic intervention in order to improve the following deficits and impairments:  Abnormal gait, Difficulty walking, Decreased activity tolerance  Visit Diagnosis: Difficulty in walking, not elsewhere classified  Hemiplegia and hemiparesis following cerebral infarction affecting right dominant side [facility])     Problem List Patient Active Problem List   Diagnosis Date Noted  Primary insomnia 02/01/2021   History of cerebrovascular accident (CVA) with residual deficit 12/06/2020   Essential hypertension 12/06/2020   Elevated hemoglobin A1c 04/20/2020   Thrombocytosis 04/20/2020   OSA on CPAP 11/17/2019   Former smoker 11/17/2019   Drug-induced constipation 11/17/2019   Obesity (BMI 30.0-34.9) 11/17/2019   Acquired hypothyroidism 11/17/2019   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 [place], [place], [address] Phone: [phone]   Fax:  [phone]  Name: Sherry Oliver MRN: [id number] Date of Birth: 1961/03/26

## 2021-04-17 ENCOUNTER — Ambulatory Visit: Payer: BC Managed Care – PPO | Admitting: Occupational Therapy

## 2021-04-17 ENCOUNTER — Ambulatory Visit: Payer: BC Managed Care – PPO

## 2021-04-17 DIAGNOSIS — M791 Myalgia, unspecified site: Secondary | ICD-10-CM | POA: Diagnosis not present

## 2021-04-17 DIAGNOSIS — G2581 Restless legs syndrome: Secondary | ICD-10-CM | POA: Diagnosis not present

## 2021-04-17 DIAGNOSIS — Z8673 Personal history of transient ischemic attack (TIA), and cerebral infarction without residual deficits: Secondary | ICD-10-CM | POA: Diagnosis not present

## 2021-04-22 ENCOUNTER — Ambulatory Visit: Payer: BC Managed Care – PPO | Admitting: Occupational Therapy

## 2021-04-22 ENCOUNTER — Ambulatory Visit: Payer: BC Managed Care – PPO

## 2021-04-24 ENCOUNTER — Ambulatory Visit: Payer: BC Managed Care – PPO

## 2021-04-24 ENCOUNTER — Other Ambulatory Visit: Payer: Self-pay | Admitting: Dermatology

## 2021-04-24 ENCOUNTER — Ambulatory Visit: Payer: BC Managed Care – PPO | Admitting: Occupational Therapy

## 2021-04-29 ENCOUNTER — Ambulatory Visit: Payer: BC Managed Care – PPO

## 2021-04-29 ENCOUNTER — Ambulatory Visit: Payer: BC Managed Care – PPO | Admitting: Dermatology

## 2021-04-29 ENCOUNTER — Encounter: Payer: BC Managed Care – PPO | Admitting: Occupational Therapy

## 2021-05-01 ENCOUNTER — Other Ambulatory Visit: Payer: Self-pay

## 2021-05-01 ENCOUNTER — Ambulatory Visit: Payer: BC Managed Care – PPO | Admitting: Physical Therapy

## 2021-05-01 ENCOUNTER — Encounter: Payer: BC Managed Care – PPO | Admitting: Occupational Therapy

## 2021-05-01 DIAGNOSIS — M6281 Muscle weakness (generalized): Secondary | ICD-10-CM | POA: Diagnosis not present

## 2021-05-01 DIAGNOSIS — R262 Difficulty in walking, not elsewhere classified: Secondary | ICD-10-CM | POA: Diagnosis not present

## 2021-05-01 DIAGNOSIS — M79672 Pain in left foot: Secondary | ICD-10-CM

## 2021-05-01 DIAGNOSIS — G4733 Obstructive sleep apnea (adult) (pediatric): Secondary | ICD-10-CM | POA: Diagnosis not present

## 2021-05-01 DIAGNOSIS — R278 Other lack of coordination: Secondary | ICD-10-CM | POA: Diagnosis not present

## 2021-05-01 DIAGNOSIS — I69351 Hemiplegia and hemiparesis following cerebral infarction affecting right dominant side: Secondary | ICD-10-CM | POA: Diagnosis not present

## 2021-05-01 NOTE — Therapy (Signed)
[place] MAIN [facility] SERVICES [address] [place], [place], [address] Phone: [phone]   Fax:  [phone]  Physical Therapy Evaluation  Patient Details  Name: Sherry Oliver MRN: [id number] Date of Birth: July 12, 1963 Referring Provider (PT): Jennings Books   Encounter Date: 05/01/2061   PT End of Session - 05/01/21 1449     Visit Number 1    Number of Visits 8    Date for PT Re-Evaluation 06/26/21    Authorization Type BCBS, 40VL anually PT/OT/Chiro    Authorization Time Period 04/15/21-05/13/21    Progress Note Due on Visit 10    PT Start Time 1352    PT Stop Time 1444    PT Time Calculation (min) 52 min    Equipment Utilized During Treatment Gait belt    Activity Tolerance Patient tolerated treatment well;No increased pain    Behavior During Therapy WFL for tasks assessed/performed             Past Medical History:  Diagnosis Date   Allergy    Gastropathy    GERD (gastroesophageal reflux disease)    Headache    Hyperlipidemia    Hypertension    Hypothyroid    Lumbar radiculopathy    Lung nodule    Sleep apnea     Past Surgical History:  Procedure Laterality Date   APPENDECTOMY  2008   [place]   bladder stem   BREAST BIOPSY Left 06/06/2019   Lymph node US Bx, hydromarker. REACTIVE LYMPH NODE, FAVOR BENIGN   COLONOSCOPY WITH PROPOFOL N/A 02/19/2015   Procedure: COLONOSCOPY WITH PROPOFOL;  Surgeon: Lollie Sails, MD;  Location: [facility] ENDOSCOPY;  Service: Endoscopy;  Laterality: N/A;   COLONOSCOPY WITH PROPOFOL N/A 03/24/2018   Procedure: COLONOSCOPY WITH PROPOFOL;  Surgeon: Lollie Sails, MD;  Location: [facility] ENDOSCOPY;  Service: Endoscopy;  Laterality: N/A;   ELBOW SURGERY Right 1982   ESOPHAGOGASTRODUODENOSCOPY     FOOT SURGERY Left 2007   neuroma removal, repeat 2008    There were no vitals filed for this visit.    Subjective Assessment - 05/01/21 1356     Subjective Pt has had multiple  surgical procedures to remove the neuromas in her foot. Pt reports her podiatrist could perform some highly invasive surgeries that she is not interested in due to the invasiveness of it. Pt reports she has good days and bad days. Pt reports she has been trying positioning pads on her foot and they are helpful for multiple hours. Pt reports harder soled shoes are more helpful for the pain. Pt has been working from home since the start of [place].    How long can you stand comfortably? 2 hours in hard soled shoes vs 30 min in soft soled shoes    How long can you walk comfortably? 2 hours    Patient Stated Goals Improve the pain in the left foot.    Currently in Pain? Yes    Pain Score 3     Pain Location Foot    Pain Orientation Left    Pain Descriptors / Indicators Aching;Dull   bad day: burning, stabbing and spasm like pain in the foot   Pain Type Chronic pain    Pain Frequency Constant    Aggravating Factors  standing in soft soled shoes, being on her feet for many hours    Pain Relieving Factors changing footwear "shifts weight on foot"  [facility] PT Assessment - 05/01/21 0001       Assessment   Medical Diagnosis Left foot pain    Referring Provider (PT) Manuella Ghazi, [place]    Hand Dominance Right      Precautions   Precautions None      Restrictions   Weight Bearing Restrictions No      Prior Function   Level of Independence Independent    Vocation Full time employment    Vocation Requirements [employer]    Leisure Reading      Strength   Strength Assessment Site Ankle    Right/Left Ankle Left    Left Ankle Dorsiflexion 4+/5    Left Ankle Plantar Flexion 5/5    Left Ankle Inversion 5/5    Left Ankle Eversion 5/5      Ambulation/Gait   Ambulation/Gait Yes    Ambulation/Gait Assistance 7: Independent           Gait: min trendelenburg gait noted on the   Treatment this date:   Began to target intrinsic foot strength and muscle activation  Therex:  Toe  Yoga: 20 x great toe lift and lesser toe lift alternation with 3 seconds holds, pt noted fatigue in foot muscles  near end of reps Arch lifts : 20 x with 3 seconds holds, pt noted fatigue in foot muscles  near end of reps Towel Scrunches x 20 with 3 seconds holds, pt noted fatigue in foot muscles  near end of reps              Objective measurements completed on examination: See above findings.               PT Education - 05/01/21 1448     Education Details Pt educated in anatomy of the foot and potential causes of pain    Person(s) Educated Patient    Methods Explanation    Comprehension Verbalized understanding              PT Short Term Goals - 05/01/21 1455       PT SHORT TERM GOAL #1   Title Patient will increase FOTO score by greater than 5 points  to demonstrate statistically significant improvement in mobility and quality of life.    Baseline Will complete at second appointment    Time 4    Period Weeks    Status New    Target Date 05/29/21      PT SHORT TERM GOAL #2   Title Patient will be independent in home exercise program to improve strength/mobility for better functional independence with ADLs.    Baseline Pt does not have HEP for her foot    Time 3    Period Weeks    Status New    Target Date 05/22/21               PT Long Term Goals - 05/01/21 1501       PT LONG TERM GOAL #1   Title Patient will increase FOTO score by 10 points in order to demonstrate statistically significant improvement in mobility and quality of life.    Baseline Will complete at second visit    Time 8    Period Weeks    Status New    Target Date 06/26/21      PT LONG TERM GOAL #2   Title Patient will report a worst pain of 3/10 on VAS in    her left foot   to improve tolerance with  ADLs and reduced symptoms with activities.    Baseline Pt reports intermittent pain of greater than 3/10 at times    Time 8    Period Weeks    Status New    Target  Date 06/26/21                    Plan - 05/01/21 1451     Personal Factors and Comorbidities Comorbidity 1;Time since onset of injury/illness/exacerbation    Comorbidities prior CVA (per imaging)    Examination-Activity Limitations Stand    Stability/Clinical Decision Making Stable/Uncomplicated    Clinical Decision Making Low    Rehab Potential Fair    PT Frequency 1x / week    PT Duration 8 weeks    PT Treatment/Interventions ADLs/Self Care Home Management;Gait training;Orthotic Fit/Training;Passive range of motion;Taping;Joint Manipulations;Balance training;Neuromuscular re-education;Therapeutic exercise;Therapeutic activities;Manual techniques    PT Next Visit Plan Manul therpay for plantar aspect of foot, progression of foot intrinsics to weightbearing positions    PT Home Exercise Plan Provided at IE on 05/01/21 Access code: [email or web address]    Consulted and Agree with Plan of Care Patient           Clinical Impression statement:  Pt is a 60 y.o. female who presents with c/o chronic foot pain in the plantar aspect of her left foot. Pt presents with deficits in ambulatory capacity without pain and offloading, difficulty with standing in sustained positions for prolonged periods, and weakness in her ankle and foot muscles on the involved side. Pt has had history of multiple surgeries on her left foot, but none have been successful in the improvement of her function and pain levels. Pt currently has activity limitations in her ability to complete daily activities involving prolonged static standing as well as participation restrictions in her ability to attend events involving prolonged standing and walking without feeling the need to rest or change shoes intermittently to offload certain aspects of her foot. Pt will benefit from physical therapy intervention in order to address her pain and dysfunction and to improve her QOL.   Patient will benefit from skilled therapeutic intervention  in order to improve the following deficits and impairments:  Abnormal gait, Difficulty walking, Decreased activity tolerance  Visit Diagnosis: Pain in left foot     Problem List Patient Active Problem List   Diagnosis Date Noted   Primary insomnia 02/01/2021   History of cerebrovascular accident (CVA) with residual deficit 12/06/2020   Essential hypertension 12/06/2020   Elevated hemoglobin A1c 04/20/2020   Thrombocytosis 04/20/2020   OSA on CPAP 11/17/2019   Former smoker 11/17/2019   Drug-induced constipation 11/17/2019   Obesity (BMI 30.0-34.9) 11/17/2019   Acquired hypothyroidism 11/17/2019   Drug-induced myopathy 11/17/2019   Mixed hyperlipidemia 11/17/2019   Restless leg syndrome 11/17/2019   Centrilobular emphysema ([place]) 11/17/2019   Family history of BRCA2 gene positive 11/17/2019   Rivka Barbara PT, DPT   Particia Lather 05/01/2021, 3:12 PM  [place] MAIN [facility] SERVICES [address] [place], [place], [address] Phone: [phone]   Fax:  [phone]  Name: CHALONDA SCHLATTER MRN: [id number] Date of Birth: September 05, 1961

## 2021-05-04 ENCOUNTER — Other Ambulatory Visit: Payer: Self-pay | Admitting: Family Medicine

## 2021-05-04 DIAGNOSIS — E039 Hypothyroidism, unspecified: Secondary | ICD-10-CM

## 2021-05-04 NOTE — Telephone Encounter (Signed)
Requested medication (s) are due for refill today: yes  Requested medication (s) are on the active medication list: yes  Last refill:  01/16/21 #90  Future visit scheduled: yes  Notes to clinic:  overdue lab work   Requested Prescriptions  Pending Prescriptions Disp Refills   liothyronine (CYTOMEL) 5 MCG tablet [Pharmacy Med Name: LIOTHYRONINE SODIUM TABS 5MCG] 90 tablet 3    Sig: TAKE 1 TABLET DAILY     Endocrinology:  Hypothyroid Agents Failed - 05/04/2021  9:09 AM      Failed - TSH needs to be rechecked within 3 months after an abnormal result. Refill until TSH is due.      Failed - TSH in normal range and within 360 days    TSH  Date Value Ref Range Status  04/15/2020 1.73 0.40 - 4.50 mIU/L Final          Passed - Valid encounter within last 12 months    Recent Outpatient Visits           1 month ago Mixed hyperlipidemia   [place], DO   3 months ago Essential hypertension   [place], DO   4 months ago History of cerebrovascular accident (CVA) with residual deficit   [place], DO   12 months ago Pain and swelling of left knee   [place], DO   1 year ago Annual physical exam   [facility] Olin Hauser, DO       Future Appointments             In 1 week Brendolyn Patty, MD [place]   In 1 month [facility], [facility]

## 2021-05-05 ENCOUNTER — Other Ambulatory Visit: Payer: Self-pay

## 2021-05-05 DIAGNOSIS — E782 Mixed hyperlipidemia: Secondary | ICD-10-CM

## 2021-05-05 DIAGNOSIS — I6930 Unspecified sequelae of cerebral infarction: Secondary | ICD-10-CM

## 2021-05-05 DIAGNOSIS — I10 Essential (primary) hypertension: Secondary | ICD-10-CM

## 2021-05-05 MED ORDER — HYDROCHLOROTHIAZIDE 25 MG PO TABS
25.0000 mg | ORAL_TABLET | Freq: Every day | ORAL | 3 refills | Status: DC
Start: 2021-05-05 — End: 2022-05-12

## 2021-05-05 MED ORDER — PRAVASTATIN SODIUM 20 MG PO TABS: 20.0000 mg | ORAL_TABLET | Freq: Every day | ORAL | 3 refills | Status: DC

## 2021-05-06 ENCOUNTER — Other Ambulatory Visit: Payer: Self-pay

## 2021-05-06 ENCOUNTER — Ambulatory Visit: Payer: BC Managed Care – PPO | Admitting: Physical Therapy

## 2021-05-06 ENCOUNTER — Encounter: Payer: BC Managed Care – PPO | Admitting: Occupational Therapy

## 2021-05-06 DIAGNOSIS — I69351 Hemiplegia and hemiparesis following cerebral infarction affecting right dominant side: Secondary | ICD-10-CM | POA: Diagnosis not present

## 2021-05-06 DIAGNOSIS — R262 Difficulty in walking, not elsewhere classified: Secondary | ICD-10-CM

## 2021-05-06 DIAGNOSIS — M79672 Pain in left foot: Secondary | ICD-10-CM

## 2021-05-06 DIAGNOSIS — M6281 Muscle weakness (generalized): Secondary | ICD-10-CM | POA: Diagnosis not present

## 2021-05-06 DIAGNOSIS — R278 Other lack of coordination: Secondary | ICD-10-CM | POA: Diagnosis not present

## 2021-05-06 NOTE — Therapy (Signed)
[place] MAIN [facility] SERVICES [address] [place], [place], [address] Phone: [phone]   Fax:  [phone]  Physical Therapy Treatment  Patient Details  Name: Sherry Oliver MRN: [id number] Date of Birth: [birth year] Referring Provider (PT): Jennings Books   Encounter Date: 05/06/2021   PT End of Session - 05/06/21 1446     Visit Number 2    Number of Visits 8    Date for PT Re-Evaluation 06/26/21    Authorization Type BCBS, 40VL anually PT/OT/Chiro    Authorization Time Period 04/15/21-05/13/21    Progress Note Due on Visit 10    PT Start Time 0150    PT Stop Time 0230    PT Time Calculation (min) 40 min    Activity Tolerance Patient tolerated treatment well;No increased pain    Behavior During Therapy WFL for tasks assessed/performed             Past Medical History:  Diagnosis Date   Allergy    Gastropathy    GERD (gastroesophageal reflux disease)    Headache    Hyperlipidemia    Hypertension    Hypothyroid    Lumbar radiculopathy    Lung nodule    Sleep apnea     Past Surgical History:  Procedure Laterality Date   APPENDECTOMY  2008   [place]   bladder stem   BREAST BIOPSY Left 06/06/2019   Lymph node US Bx, hydromarker. REACTIVE LYMPH NODE, FAVOR BENIGN   COLONOSCOPY WITH PROPOFOL N/A 02/19/2015   Procedure: COLONOSCOPY WITH PROPOFOL;  Surgeon: Lollie Sails, MD;  Location: [facility] ENDOSCOPY;  Service: Endoscopy;  Laterality: N/A;   COLONOSCOPY WITH PROPOFOL N/A 03/24/2018   Procedure: COLONOSCOPY WITH PROPOFOL;  Surgeon: Lollie Sails, MD;  Location: [facility] ENDOSCOPY;  Service: Endoscopy;  Laterality: N/A;   ELBOW SURGERY Right 1982   ESOPHAGOGASTRODUODENOSCOPY     FOOT SURGERY Left 2007   neuroma removal, repeat 2008    There were no vitals filed for this visit.   Subjective Assessment - 05/06/21 1358     Subjective Pt reports some soreness in her foot, primarilly in the area of her big  toe where she has "bunion". Other than that she has not had any new issues since her last visit.    Pertinent History Sherry Oliver is a 17yoF who sustained a thalamic CVA in March 2022, initially with weakness and incoordination deficits of Rt hemibody. PT evaluation in ED recommended OPPT services. Pt has not had any therapy services since DC from hospital, but has been awaiting scheduling for this evlauation. Pt has been working on self-guided fine motor coordination with childrens hand-writing activity books that she bought. Pt reports at this date, all appreciable deficits have resolved, pt back at baseline. Pt denies any recent falls, acute imbalance, asymmetrical weakness or changes to sensorium.    Limitations Walking;Standing    How long can you stand comfortably? 2 hours in hard soled shoes vs 30 min in soft soled shoes    How long can you walk comfortably? 2 hours    Patient Stated Goals Improve the pain in the left foot.    Currently in Pain? Yes    Pain Score 4     Pain Location Foot    Pain Orientation Left    Pain Descriptors / Indicators Aching;Dull    Pain Type Chronic pain    Pain Onset More than a month ago    Pain Frequency  Constant                [facility] PT Assessment - 05/06/21 0001       Observation/Other Assessments   Focus on Therapeutic Outcomes (FOTO)  49               Treatment this session  Treatment this session: Arch lifts 2 x 10 x 2 sec Toe Yoga 2 x 10 x 2 sec Towel scrunches 2 x 10 x 2 sec  Bridges:  1 x 10 with arch lift  1 x 10 with toe yoga VC and demonstration provided for all of the above exercises in order to ensure proper hold times.  Palpation of plantar ligament caused some discomfort in the plantar aspect of her toes.   Manual therapy:  X15 min with STM to plantar aspect of the L foot as well as cross friction massage to long and short plantar ligaments                       PT Short Term Goals - 05/01/21  1455       PT SHORT TERM GOAL #1   Title Patient will increase FOTO score by greater than 5 points  to demonstrate statistically significant improvement in mobility and quality of life.    Baseline Will complete at second appointment    Time 4    Period Weeks    Status New    Target Date 05/29/21      PT SHORT TERM GOAL #2   Title Patient will be independent in home exercise program to improve strength/mobility for better functional independence with ADLs.    Baseline Pt does not have HEP for her foot    Time 3    Period Weeks    Status New    Target Date 05/22/21               PT Long Term Goals - 05/01/21 1501       PT LONG TERM GOAL #1   Title Patient will increase FOTO score by 10 points in order to demonstrate statistically significant improvement in mobility and quality of life.    Baseline Will complete at second visit    Time 8    Period Weeks    Status New    Target Date 06/26/21      PT LONG TERM GOAL #2   Title Patient will report a worst pain of 3/10 on VAS in    her left foot   to improve tolerance with ADLs and reduced symptoms with activities.    Baseline Pt reports intermittent pain of greater than 3/10 at times    Time 8    Period Weeks    Status New    Target Date 06/26/21                   Plan - 05/06/21 1447     Clinical Impression Statement Pt tolerated initial physical therapy treatment session well. She progressed with intrinsic foot muscle strengthening and required min cues for proper performence of the exercises. Pt responded well to manual therapy and STM to the plantar arch and flantar lifaments and surrounding musculature with no significant increase in pain but some soreness. Pt will continue to benefit from skilled physical therapy intervention to address her pain and dysfunction.    Personal Factors and Comorbidities Comorbidity 1;Time since onset of injury/illness/exacerbation    Comorbidities prior CVA (per imaging)  Examination-Activity Limitations Stand    Stability/Clinical Decision Making Stable/Uncomplicated    Clinical Decision Making Low    Rehab Potential Fair    PT Frequency 1x / week    PT Duration 8 weeks    PT Treatment/Interventions ADLs/Self Care Home Management;Gait training;Orthotic Fit/Training;Passive range of motion;Taping;Joint Manipulations;Balance training;Neuromuscular re-education;Therapeutic exercise;Therapeutic activities;Manual techniques    PT Next Visit Plan Manul therpay for plantar aspect of foot, progression of foot intrinsics to weightbearing positions, further assessment as to physiological cause of foot pain as indicated    PT Home Exercise Plan Provided at IE on 05/01/21 Access code: [email or web address]    Consulted and Agree with Plan of Care Patient             Patient will benefit from skilled therapeutic intervention in order to improve the following deficits and impairments:  Abnormal gait, Difficulty walking, Decreased activity tolerance  Visit Diagnosis: Pain in left foot  Difficulty in walking, not elsewhere classified     Problem List Patient Active Problem List   Diagnosis Date Noted   Primary insomnia 02/01/2021   History of cerebrovascular accident (CVA) with residual deficit 12/06/2020   Essential hypertension 12/06/2020   Elevated hemoglobin A1c 04/20/2020   Thrombocytosis 04/20/2020   OSA on CPAP 11/17/2019   Former smoker 11/17/2019   Drug-induced constipation 11/17/2019   Obesity (BMI 30.0-34.9) 11/17/2019   Acquired hypothyroidism 11/17/2019   Drug-induced myopathy 11/17/2019   Mixed hyperlipidemia 11/17/2019   Restless leg syndrome 11/17/2019   Centrilobular emphysema ([place]) 11/17/2019   Family history of BRCA2 gene positive 11/17/2019   Rivka Barbara PT, DPT   Particia Lather 05/06/2021, 4:40 PM  [place] MAIN [facility] SERVICES [address] [place], [place], [address] Phone: [phone]    Fax:  [phone]  Name: TALEYA WHITCHER MRN: [id number] Date of Birth: [birth year]

## 2021-05-08 ENCOUNTER — Ambulatory Visit: Payer: BC Managed Care – PPO

## 2021-05-08 ENCOUNTER — Encounter: Payer: BC Managed Care – PPO | Admitting: Occupational Therapy

## 2021-05-13 ENCOUNTER — Other Ambulatory Visit: Payer: Self-pay

## 2021-05-13 ENCOUNTER — Encounter: Payer: BC Managed Care – PPO | Admitting: Occupational Therapy

## 2021-05-13 ENCOUNTER — Ambulatory Visit: Payer: BC Managed Care – PPO

## 2021-05-13 ENCOUNTER — Encounter: Payer: Self-pay | Admitting: Physical Therapy

## 2021-05-13 ENCOUNTER — Ambulatory Visit: Payer: BC Managed Care – PPO | Admitting: Physical Therapy

## 2021-05-13 DIAGNOSIS — M6281 Muscle weakness (generalized): Secondary | ICD-10-CM | POA: Diagnosis not present

## 2021-05-13 DIAGNOSIS — M79672 Pain in left foot: Secondary | ICD-10-CM

## 2021-05-13 DIAGNOSIS — I69351 Hemiplegia and hemiparesis following cerebral infarction affecting right dominant side: Secondary | ICD-10-CM | POA: Diagnosis not present

## 2021-05-13 DIAGNOSIS — R278 Other lack of coordination: Secondary | ICD-10-CM

## 2021-05-13 DIAGNOSIS — R262 Difficulty in walking, not elsewhere classified: Secondary | ICD-10-CM | POA: Diagnosis not present

## 2021-05-13 NOTE — Therapy (Signed)
[place] [facility] SERVICES [address] [place], [place], [address] Phone: [phone]   Fax:  [phone]  Physical Therapy Treatment  Patient Details  Name: Sherry Oliver MRN: [id number] Date of Birth: 1961-08-17 Referring Provider (PT): Jennings Books   Encounter Date: 05/13/2021   PT End of Session - 05/13/21 1333     Visit Number 3    Number of Visits 8    Date for PT Re-Evaluation 06/26/21    Authorization Type [employer], 40VL anually PT/OT/Chiro    Authorization Time Period 04/15/21-05/13/21    Progress Note Due on Visit 10    PT Start Time 1335    PT Stop Time 1428    PT Time Calculation (min) 53 min    Activity Tolerance Patient tolerated treatment well;No increased pain    Behavior During Therapy WFL for tasks assessed/performed             Past Medical History:  Diagnosis Date   Allergy    Gastropathy    GERD (gastroesophageal reflux disease)    Headache    Hyperlipidemia    Hypertension    Hypothyroid    Lumbar radiculopathy    Lung nodule    Sleep apnea     Past Surgical History:  Procedure Laterality Date   APPENDECTOMY  2008   [place]   bladder stem   BREAST BIOPSY Left 06/06/2019   Lymph node US Bx, hydromarker. REACTIVE LYMPH NODE, FAVOR BENIGN   COLONOSCOPY WITH PROPOFOL N/A 02/19/2015   Procedure: COLONOSCOPY WITH PROPOFOL;  Surgeon: Lollie Sails, MD;  Location: [facility] ENDOSCOPY;  Service: Endoscopy;  Laterality: N/A;   COLONOSCOPY WITH PROPOFOL N/A 03/24/2018   Procedure: COLONOSCOPY WITH PROPOFOL;  Surgeon: Lollie Sails, MD;  Location: [facility] ENDOSCOPY;  Service: Endoscopy;  Laterality: N/A;   ELBOW SURGERY Right 1982   ESOPHAGOGASTRODUODENOSCOPY     FOOT SURGERY Left 2007   neuroma removal, repeat 2008    There were no vitals filed for this visit.   Subjective Assessment - 05/13/21 1339     Subjective Patient reports not much pain today but states she hasn't been on it  much; She reports her great toe is still bothering her especially when doing the exercise.    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 PT evaluation in ED recommended OPPT services. Pt has not had any therapy services since DC from hospital, but has been awaiting scheduling for this evlauation. Pt has been working on self-guided fine motor coordination with childrens hand-writing activity books that she bought. Pt reports at this date, all appreciable deficits have resolved, pt back at baseline. Pt denies any recent falls, acute imbalance, asymmetrical weakness or changes to sensorium.    Limitations Walking;Standing    How long can you stand comfortably? 2 hours in hard soled shoes vs 30 min in soft soled shoes    How long can you walk comfortably? 2 hours    Patient Stated Goals Improve the pain in the left foot.    Currently in Pain? Yes    Pain Score 2     Pain Location Foot    Pain Orientation Left    Pain Descriptors / Indicators Aching;Sore    Pain Type Chronic pain    Pain Onset More than a month ago    Pain Frequency Constant    Aggravating  Factors  worse with prolonged standing/walking; soft soled shoes    Pain Relieving Factors rest/seated    Effect of Pain on Daily Activities decreased walking tolerance;    Multiple Pain Sites No                  TREATMENT:  Patient reports increased foot pain as a result of intrinsic foot exercise.   PT assessed left foot She has pain with toe extension and pressure along MTP joints. She also reports pain with squeezing (adduction) along MTP joints  PT performed PROM to toes in flexion/extension in tolerated ROM PT also performed passive stretch to MTP joints with splaying toes. Patient reports mild discomfort but states its not too bad  PT identified hypomobility along distal interphalangeal joint of 2nd toe. PT  performed grade II-III joint mobs AP/PA 10 sec bouts x4 reps each with passive flexion/extension; She reports pain in distal foot with passive extension;  Patient reports she has tried various inserts with no relief. She reports she iced her foot in the past but hasn't been icing lately  PT educated patient in importance of cryotherapy to help with inflammation; PT performed ice massage to help reduce inflammation and improve tissue extensibility. During ice massage patient reports increased left calf pain. PT stopped ice massage and calf pain diminished.  PT identified tenderness along left posterior tib tendon and multiple trigger points along left posterior tib muscle belly as well as trigger points throughout the calf. She reports referred pain in the foot with palpation to trigger points. PT concerned pain in foot could be referred from nerve impingement in calf.  Recommend we work on reducing trigger points in calf to see if that would alleviate foot pain;                   PT Education - 05/13/21 1333     Education Details exercise technique/positioning;    Person(s) Educated Patient    Methods Explanation;Verbal cues    Comprehension Returned demonstration;Verbal cues required;Verbalized understanding;Need further instruction              PT Short Term Goals - 05/01/21 1455       PT SHORT TERM GOAL #1   Title Patient will increase FOTO score by greater than 5 points  to demonstrate statistically significant improvement in mobility and quality of life.    Baseline Will complete at second appointment    Time 4    Period Weeks    Status New    Target Date 05/29/21      PT SHORT TERM GOAL #2   Title Patient will be independent in home exercise program to improve strength/mobility for better functional independence with ADLs.    Baseline Pt does not have HEP for her foot    Time 3    Period Weeks    Status New    Target Date 05/22/21               PT  Long Term Goals - 05/01/21 1501       PT LONG TERM GOAL #1   Title Patient will increase FOTO score by 10 points in order to demonstrate statistically significant improvement in mobility and quality of life.    Baseline Will complete at second visit    Time 8    Period Weeks    Status New    Target Date 06/26/21      PT LONG TERM GOAL #2  Title Patient will report a worst pain of 3/10 on VAS in    her left foot   to improve tolerance with ADLs and reduced symptoms with activities.    Baseline Pt reports intermittent pain of greater than 3/10 at times    Time 8    Period Weeks    Status New    Target Date 06/26/21                   Plan - 05/14/21 0819     Clinical Impression Statement Patient motivated and participated fair within session. She reports continued pain in LLE foot and states that the exercise seems to make the pain increase. She reports pain in left foot with all movement. She is still wearing corn pads to help unweight metatarsal joints. Patient did exhibit stiffness at distal interphalangeal joint on 2nd toe which seemed to exacerbate symptoms. PT performed joint mobilizations to help improve joint mobility and reduce pain/stiffness. PT educated patient about cryotherapy to help with inflammation. PT performed ice massage to help reduce inflammation and reduce tightness. Patient reports increased calf pain with ice massage. PT assessed LLE calf with tenderness along posterior tib tendon and multiple trigger points in the deep calf. Concerned about possible nerve impingement in calf which could be referring pain to left foot. Patient does not return to PT for 2 weeks. Recommend patient work on ROM as tolerated but not overdo it to avoid inflamming the foot. Will consider dry needling and other manual techniques to help reduce trigger points in calf to reduce foot pain. Patient would benefit from additional skilled PT Intervention to improve ROM and reduce pain with  ADLs.    Personal Factors and Comorbidities Comorbidity 1;Time since onset of injury/illness/exacerbation    Comorbidities prior CVA (per imaging)    Examination-Activity Limitations Stand    Stability/Clinical Decision Making Stable/Uncomplicated    Rehab Potential Fair    PT Frequency 1x / week    PT Duration 8 weeks    PT Treatment/Interventions ADLs/Self Care Home Management;Gait training;Orthotic Fit/Training;Passive range of motion;Taping;Joint Manipulations;Balance training;Neuromuscular re-education;Therapeutic exercise;Therapeutic activities;Manual techniques    PT Next Visit Plan Manul therpay for plantar aspect of foot, progression of foot intrinsics to weightbearing positions, further assessment as to physiological cause of foot pain as indicated    PT Home Exercise Plan Provided at IE on 05/01/21 Access code: [email or web address]    Consulted and Agree with Plan of Care Patient             Patient will benefit from skilled therapeutic intervention in order to improve the following deficits and impairments:  Abnormal gait, Difficulty walking, Decreased activity tolerance  Visit Diagnosis: Pain in left foot  Difficulty in walking, not elsewhere classified  Muscle weakness (generalized)  Other lack of coordination     Problem List Patient Active Problem List   Diagnosis Date Noted   Primary insomnia 02/01/2021   History of cerebrovascular accident (CVA) with residual deficit 12/06/2020   Essential hypertension 12/06/2020   Elevated hemoglobin A1c 04/20/2020   Thrombocytosis 04/20/2020   OSA on CPAP 11/17/2019   Former smoker 11/17/2019   Drug-induced constipation 11/17/2019   Obesity (BMI 30.0-34.9) 11/17/2019   Acquired hypothyroidism 11/17/2019   Drug-induced myopathy 11/17/2019   Mixed hyperlipidemia 11/17/2019   Restless leg syndrome 11/17/2019   Centrilobular emphysema ([place]) 11/17/2019   Family history of BRCA2 gene positive 11/17/2019     Kashay Cavenaugh 05/14/2021, 8:28 AM  [place]  MAIN [facility] SERVICES [place], [place], [address] Phone: [phone]   Fax:  [phone]  Name: BREELEY BISCHOF MRN: [id number] Date of Birth: [birth year]

## 2021-05-14 ENCOUNTER — Ambulatory Visit: Payer: BC Managed Care – PPO | Admitting: Dermatology

## 2021-05-14 DIAGNOSIS — L2089 Other atopic dermatitis: Secondary | ICD-10-CM | POA: Diagnosis not present

## 2021-05-14 DIAGNOSIS — L309 Dermatitis, unspecified: Secondary | ICD-10-CM

## 2021-05-14 DIAGNOSIS — L814 Other melanin hyperpigmentation: Secondary | ICD-10-CM | POA: Diagnosis not present

## 2021-05-14 MED ORDER — DESOXIMETASONE 0.25 % EX CREA: TOPICAL_CREAM | CUTANEOUS | 3 refills | Status: DC

## 2021-05-14 NOTE — Progress Notes (Signed)
   Follow-Up Visit   Subjective  Sherry Oliver is a 60 y.o. female who presents for the following: Eczema (Patient here for 6 month follow-up Atopic Dermatitis. She has a mild outbreak on her right arm, otherwise she is controlled on Dupixent injections, no side effects. Palms and soles have remained clear. She uses desoximetasone 0.25% cream prn flares and hasn't had to use Protopic ointment. ). No eye irritation.   The following portions of the chart were reviewed this encounter and updated as appropriate:       Review of Systems:  No other skin or systemic complaints except as noted in HPI or Assessment and Plan.  Objective  Well appearing patient in no apparent distress; mood and affect are within normal limits.  A focused examination was performed including face, arms, hands, feet. Relevant physical exam findings are noted in the Assessment and Plan.  arms, palms, soles Erythema with hyperlinear skin markings on hands, palms, no scale; pink scaly papules on right forearm; nail dystrophy R index, R thumb, R 3rd finger with some horizontal ridging. Feet clear per patient   Assessment & Plan  Lentigines - Scattered tan macules on the face - Due to sun exposure - Benign-appering, observe - Recommend daily broad spectrum sunscreen SPF 30+ to sun-exposed areas, reapply every 2 hours as needed. - Call for any changes - Discussed BBL $350/tx.  Other atopic dermatitis arms, palms, soles  Much improved on Dupixent. Tolerating well without side effects. Pt is very pleased with results.  Atopic dermatitis - Severe, on Dupixent (biologic medication).  Atopic dermatitis (eczema) is a chronic, relapsing, pruritic condition that can significantly affect quality of life. It is often associated with allergic rhinitis and/or asthma and can require treatment with topical medications, phototherapy, or in severe cases a biologic medication called Dupixent.   Continue Dupixent injections q 2  weeks Continue desoximetasone 0.25% cream Apply to Aas qd/bid prn flares. Avoid face, groin, axilla.  Continue Protopic 0.1% ointment qd/bid prn.   Recommend patient see eye doctor if eye irritation develops. (Pt has h/o seasonal allergies with eye symptoms.)  Dupilumab (Dupixent) is a treatment given by injection for adults and children with moderate-to-severe atopic dermatitis. Goal is control of skin condition, not cure. It is given as 2 injections at the first dose followed by 1 injection ever 2 weeks thereafter.  Young children are dosed monthly.  Potential side effects include allergic reaction, herpes infections, injection site reactions and conjunctivitis (inflammation of the eyes).  The use of Dupixent requires long term medication management, including periodic office visits.  Topical steroids (such as triamcinolone, fluocinolone, fluocinonide, mometasone, clobetasol, halobetasol, betamethasone, hydrocortisone) can cause thinning and lightening of the skin if they are used for too long in the same area. Your physician has selected the right strength medicine for your problem and area affected on the body. Please use your medication only as directed by your physician to prevent side effects.    Eczema, unspecified type  Related Medications desoximetasone (TOPICORT) 0.25 % cream Apply to affected areas one to two times daily as needed until rash improved. Avoid applying to face, groin, and axilla.  Return in about 6 months (around 11/11/2021) for Atopic Dermatitis.  IJamesetta Orleans, CMA, am acting as scribe for Brendolyn Patty, MD .  Documentation: I have reviewed the above documentation for accuracy and completeness, and I agree with the above.  Brendolyn Patty MD

## 2021-05-14 NOTE — Patient Instructions (Signed)
Dupilumab (Dupixent) is a treatment given by injection for adults and children with moderate-to-severe atopic dermatitis. Goal is control of skin condition, not cure. It is given as 2 injections at the first dose followed by 1 injection ever 2 weeks thereafter.  Young children are dosed monthly.  Potential side effects include allergic reaction, herpes infections, injection site reactions and conjunctivitis (inflammation of the eyes).  The use of Dupixent requires long term medication management, including periodic office visits.   If you have any questions or concerns for your doctor, please call our main line at [phone] and press option 4 to reach your doctor's medical assistant. If no one answers, please leave a voicemail as directed and we will return your call as soon as possible. Messages left after 4 pm will be answered the following business day.   You may also send [place] a message via [place]. We typically respond to MyChart messages within 1-2 business days.  For prescription refills, please ask your pharmacy to contact our office. Our fax number is [phone].  If you have an urgent issue when the clinic is closed that cannot wait until the next business day, you can page your doctor at the number below.    Please note that while we do our best to be available for urgent issues outside of office hours, we are not available 24/7.   If you have an urgent issue and are unable to reach [place], you may choose to seek medical care at your doctor's office, retail clinic, urgent care center, or emergency room.  If you have a medical emergency, please immediately call 911 or go to the emergency department.  Pager Numbers  - Dr. Nehemiah Massed: [phone]  - Dr. Laurence Ferrari: [phone]  - Dr. Nicole Kindred: [phone]  In the event of inclement weather, please call our main line at [phone] for an update on the status of any delays or closures.  Dermatology Medication Tips: Please keep the  boxes that topical medications come in in order to help keep track of the instructions about where and how to use these. Pharmacies typically print the medication instructions only on the boxes and not directly on the medication tubes.   If your medication is too expensive, please contact our office at [phone] option 4 or send [place] a message through [place].   We are unable to tell what your co-pay for medications will be in advance as this is different depending on your insurance coverage. However, we may be able to find a substitute medication at lower cost or fill out paperwork to get insurance to cover a needed medication.   If a prior authorization is required to get your medication covered by your insurance company, please allow [place] 1-2 business days to complete this process.  Drug prices often vary depending on where the prescription is filled and some pharmacies may offer cheaper prices.  The website www.goodrx.com contains coupons for medications through different pharmacies. The prices here do not account for what the cost may be with help from insurance (it may be cheaper with your insurance), but the website can give you the price if you did not use any insurance.  - You can print the associated coupon and take it with your prescription to the pharmacy.  - You may also stop by our office during regular business hours and pick up a GoodRx coupon card.  - If you need your prescription sent electronically to a different pharmacy, notify our office through [facility] or  by phone at [phone] option 4.

## 2021-05-15 ENCOUNTER — Encounter: Payer: BC Managed Care – PPO | Admitting: Occupational Therapy

## 2021-05-15 ENCOUNTER — Ambulatory Visit: Payer: BC Managed Care – PPO

## 2021-05-20 ENCOUNTER — Ambulatory Visit: Payer: BC Managed Care – PPO

## 2021-05-20 ENCOUNTER — Telehealth: Payer: Self-pay

## 2021-05-20 ENCOUNTER — Encounter: Payer: BC Managed Care – PPO | Admitting: Occupational Therapy

## 2021-05-20 NOTE — Telephone Encounter (Signed)
[facility] called regarding Topicort Cream sent in. Not covered by insurance. Suggested alternative from them (that is covered) was Betamethasone 50 gram tube.

## 2021-05-22 ENCOUNTER — Ambulatory Visit: Payer: BC Managed Care – PPO

## 2021-05-22 ENCOUNTER — Encounter: Payer: BC Managed Care – PPO | Admitting: Occupational Therapy

## 2021-05-22 DIAGNOSIS — Z8669 Personal history of other diseases of the nervous system and sense organs: Secondary | ICD-10-CM | POA: Diagnosis not present

## 2021-05-22 DIAGNOSIS — E039 Hypothyroidism, unspecified: Secondary | ICD-10-CM | POA: Diagnosis not present

## 2021-05-22 DIAGNOSIS — G4733 Obstructive sleep apnea (adult) (pediatric): Secondary | ICD-10-CM | POA: Diagnosis not present

## 2021-05-22 DIAGNOSIS — I10 Essential (primary) hypertension: Secondary | ICD-10-CM | POA: Diagnosis not present

## 2021-05-27 ENCOUNTER — Ambulatory Visit: Payer: BC Managed Care – PPO

## 2021-05-27 ENCOUNTER — Encounter: Payer: BC Managed Care – PPO | Admitting: Occupational Therapy

## 2021-05-29 ENCOUNTER — Encounter: Payer: BC Managed Care – PPO | Admitting: Occupational Therapy

## 2021-05-29 ENCOUNTER — Other Ambulatory Visit: Payer: Self-pay

## 2021-05-29 ENCOUNTER — Ambulatory Visit: Payer: BC Managed Care – PPO | Attending: Neurology | Admitting: Physical Therapy

## 2021-05-29 ENCOUNTER — Ambulatory Visit: Payer: BC Managed Care – PPO

## 2021-05-29 DIAGNOSIS — M6281 Muscle weakness (generalized): Secondary | ICD-10-CM | POA: Insufficient documentation

## 2021-05-29 DIAGNOSIS — R262 Difficulty in walking, not elsewhere classified: Secondary | ICD-10-CM | POA: Insufficient documentation

## 2021-05-29 DIAGNOSIS — M79672 Pain in left foot: Secondary | ICD-10-CM | POA: Insufficient documentation

## 2021-05-29 NOTE — Therapy (Addendum)
[place] MAIN [facility] SERVICES [address] [place], [place], [address] Phone: [phone]   Fax:  [phone]  Physical Therapy Treatment  Patient Details  Name: DOREATHA OFFER MRN: [id number] Date of Birth: 11-19-1960 Referring Provider (PT): Jennings Books   Encounter Date: 05/29/2021   PT End of Session - 05/29/21 1143     Visit Number 4    Number of Visits 8    Date for PT Re-Evaluation 06/26/21    Authorization Type BCBS, 40VL anually PT/OT/Chiro    Progress Note Due on Visit 10    PT Start Time 1045    PT Stop Time 1130    PT Time Calculation (min) 45 min    Activity Tolerance Patient tolerated treatment well    Behavior During Therapy [facility] for tasks assessed/performed             Past Medical History:  Diagnosis Date   Allergy    Gastropathy    GERD (gastroesophageal reflux disease)    Headache    Hyperlipidemia    Hypertension    Hypothyroid    Lumbar radiculopathy    Lung nodule    Sleep apnea     Past Surgical History:  Procedure Laterality Date   APPENDECTOMY  2008   [place]   bladder stem   BREAST BIOPSY Left 06/06/2019   Lymph node US Bx, hydromarker. REACTIVE LYMPH NODE, FAVOR BENIGN   COLONOSCOPY WITH PROPOFOL N/A 02/19/2015   Procedure: COLONOSCOPY WITH PROPOFOL;  Surgeon: Lollie Sails, MD;  Location: [facility] ENDOSCOPY;  Service: Endoscopy;  Laterality: N/A;   COLONOSCOPY WITH PROPOFOL N/A 03/24/2018   Procedure: COLONOSCOPY WITH PROPOFOL;  Surgeon: Lollie Sails, MD;  Location: [facility] ENDOSCOPY;  Service: Endoscopy;  Laterality: N/A;   ELBOW SURGERY Right 1982   ESOPHAGOGASTRODUODENOSCOPY     FOOT SURGERY Left 2007   neuroma removal, repeat 2008    There were no vitals filed for this visit.   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 PT evaluation in ED recommended OPPT services. Pt has not had any therapy services since DC from hospital, but has been awaiting scheduling for this evlauation. Pt has been working on self-guided fine motor coordination with childrens hand-writing activity books that she bought. Pt reports at this date, all appreciable deficits have resolved, pt back at baseline. Pt denies any recent falls, acute imbalance, asymmetrical weakness or changes to sensorium.    Limitations Walking;Standing    How long can you stand comfortably? 2 hours in hard soled shoes vs 30 min in soft soled shoes    How long can you walk comfortably? 2 hours    Patient Stated Goals Improve the pain in the left foot.    Currently in Pain? Yes    Pain Score 3     Pain Location Foot    Pain Orientation Left    Pain Descriptors / Indicators Aching;Sore    Pain Type Chronic pain    Pain Onset More than a month ago    Pain Frequency Constant             Treatment provided  this session  Manual therapy  Several trigger points were identified in patient's gastrocnemius soleus complex as well as 1 significant trigger point in patient's posterior tibialis tendon and muscle belly.  Trigger point side reproduced pain in the patient's left foot.  In addition to trigger points pain can potentially be coming from nerve impingement in left calf.  Soft tissue mobilization and instrument assisted soft tissue mobilization was applied to this area. X27 min -utilized free up massage cream to decrease friction at tissue surface  -petrissage applied to target muscle area to alleviate trigger points -pressure applied to target trigger points for duration of 1 min to allevate trigger points -cross friction technique applied to post tib tendon at tender areas  -Pt  instructed to continue with stretches as listed below and to monitor her pain levels in her foot over the next several days to assess if pain levels are altered.   A/P and P/A joint mobilizations applied to left distal MTP of second metatarsophalangeal joint, grade 2/3, patient reported increased pain with both of these mobilizations. 2 min -Distraction at same joint did not reproduce pain. 1 min   Therex:  Gastroc stretch at door 2 x 30 sec -pt reports this stretch did not stretch target area as well as soleus stretch below  Soleus stretch at door 2 x 30 seconds -Patient reported soleus stretch was better felt stretch for target muscle area.  Pt instructed if pain levels increase as a result of the stretching she can hold off on them until next visit.   Pt educated throughout session about proper posture and technique with exercises. Improved exercise technique, movement at target joints, use of target muscles after min to mod verbal, visual, tactile cues.                        PT Education - 05/29/21 1203     Education Details Educated regarding potential soreness in days following physical therapy but should continue to monitor pain levels over the next week to see if treatments provide some benefit for her pain    Person(s) Educated Patient    Methods Explanation    Comprehension Verbalized understanding              PT Short Term Goals - 05/01/21 1455       PT SHORT TERM GOAL #1   Title Patient will increase FOTO score by greater than 5 points  to demonstrate statistically significant improvement in mobility and quality of life.    Baseline Will complete at second appointment    Time 4    Period Weeks    Status New    Target Date 05/29/21      PT SHORT TERM GOAL #2   Title Patient will be independent in home exercise program to improve strength/mobility for better functional independence with ADLs.    Baseline Pt does not have HEP for her foot     Time 3    Period Weeks    Status New    Target Date 05/22/21               PT Long Term Goals - 05/01/21 1501       PT LONG TERM GOAL #1   Title Patient will increase FOTO score by 10 points in order to demonstrate statistically significant improvement in mobility and quality of life.    Baseline Will complete at second visit    Time 8  Period Weeks    Status New    Target Date 06/26/21      PT LONG TERM GOAL #2   Title Patient will report a worst pain of 3/10 on VAS in    her left foot   to improve tolerance with ADLs and reduced symptoms with activities.    Baseline Pt reports intermittent pain of greater than 3/10 at times    Time 8    Period Weeks    Status New    Target Date 06/26/21                   Plan - 05/29/21 1144     Clinical Impression Statement Patient tolerated treatment session well as we began with soft tissue mobilization to her posterior tibialis muscle as well as her gastrocnemius and soleus muscle where trigger points were identified.  Patient did trigger points reproduced pain in her foot when they were located.  Patient indicated slight increase in pain following as patient perform some light stretching for the involved musculature.  Next session patient would like to try dry needling and these trigger points to see if there will improve her chronic pain in her foot.  Patient will continue to benefit from skilled physical therapy in order to improve her pain quality of life and overall function with less pain.    Personal Factors and Comorbidities Comorbidity 1;Time since onset of injury/illness/exacerbation    Comorbidities prior CVA (per imaging)    Examination-Activity Limitations Stand    Stability/Clinical Decision Making Stable/Uncomplicated    Clinical Decision Making Low    Rehab Potential Fair    PT Frequency 1x / week    PT Duration 8 weeks    PT Treatment/Interventions ADLs/Self Care Home Management;Gait training;Orthotic  Fit/Training;Passive range of motion;Taping;Joint Manipulations;Balance training;Neuromuscular re-education;Therapeutic exercise;Therapeutic activities;Manual techniques;Dry needling;Iontophoresis 8m/ml Dexamethasone    PT Next Visit Plan Dry needling for posterior tibialis muscle and soleus and gastroc as indicated.    PT Home Exercise Plan Added standing gastroc and soleus stretches.    Consulted and Agree with Plan of Care Patient             Patient will benefit from skilled therapeutic intervention in order to improve the following deficits and impairments:  Abnormal gait, Difficulty walking, Decreased activity tolerance  Visit Diagnosis: Pain in left foot  Difficulty in walking, not elsewhere classified  Muscle weakness (generalized)     Problem List Patient Active Problem List   Diagnosis Date Noted   Primary insomnia 02/01/2021   History of cerebrovascular accident (CVA) with residual deficit 12/06/2020   Essential hypertension 12/06/2020   Elevated hemoglobin A1c 04/20/2020   Thrombocytosis 04/20/2020   OSA on CPAP 11/17/2019   Former smoker 11/17/2019   Drug-induced constipation 11/17/2019   Obesity (BMI 30.0-34.9) 11/17/2019   Acquired hypothyroidism 11/17/2019   Drug-induced myopathy 11/17/2019   Mixed hyperlipidemia 11/17/2019   Restless leg syndrome 11/17/2019   Centrilobular emphysema ([place] 11/17/2019   Family history of BRCA2 gene positive 11/17/2019    CParticia Lather PT 05/29/2021, 12:03 PM  [place] [address]Phone: [phone]  Fax:  [phone] Name: CKORAH HUFSTEDLERMRN: [id number] of Birth: 401/18/1962

## 2021-06-03 ENCOUNTER — Encounter: Payer: BC Managed Care – PPO | Admitting: Occupational Therapy

## 2021-06-03 ENCOUNTER — Ambulatory Visit: Payer: BC Managed Care – PPO

## 2021-06-03 ENCOUNTER — Other Ambulatory Visit: Payer: Self-pay

## 2021-06-03 DIAGNOSIS — M6281 Muscle weakness (generalized): Secondary | ICD-10-CM | POA: Diagnosis not present

## 2021-06-03 DIAGNOSIS — R262 Difficulty in walking, not elsewhere classified: Secondary | ICD-10-CM

## 2021-06-03 DIAGNOSIS — M79672 Pain in left foot: Secondary | ICD-10-CM

## 2021-06-03 NOTE — Therapy (Signed)
[place] MAIN [facility] SERVICES [address] [place], [place], [address] Phone: [phone]   Fax:  [phone]  Physical Therapy Treatment  Patient Details  Name: Sherry Oliver MRN: [id number] Date of Birth: 07-Jan-1961 Referring Provider (PT): Jennings Books   Encounter Date: 06/03/2021   PT End of Session - 06/03/21 1408     Visit Number 5    Number of Visits 8    Date for PT Re-Evaluation 06/26/21    Authorization Type BCBS, 40VL anually PT/OT/Chiro    Progress Note Due on Visit 10    PT Start Time 1015    PT Stop Time 1059    PT Time Calculation (min) 44 min    Activity Tolerance Patient tolerated treatment well    Behavior During Therapy [facility] for tasks assessed/performed             Past Medical History:  Diagnosis Date   Allergy    Gastropathy    GERD (gastroesophageal reflux disease)    Headache    Hyperlipidemia    Hypertension    Hypothyroid    Lumbar radiculopathy    Lung nodule    Sleep apnea     Past Surgical History:  Procedure Laterality Date   APPENDECTOMY  2008   [place]   bladder stem   BREAST BIOPSY Left 06/06/2019   Lymph node US Bx, hydromarker. REACTIVE LYMPH NODE, FAVOR BENIGN   COLONOSCOPY WITH PROPOFOL N/A 02/19/2015   Procedure: COLONOSCOPY WITH PROPOFOL;  Surgeon: Lollie Sails, MD;  Location: [facility] ENDOSCOPY;  Service: Endoscopy;  Laterality: N/A;   COLONOSCOPY WITH PROPOFOL N/A 03/24/2018   Procedure: COLONOSCOPY WITH PROPOFOL;  Surgeon: Lollie Sails, MD;  Location: [facility] ENDOSCOPY;  Service: Endoscopy;  Laterality: N/A;   ELBOW SURGERY Right 1982   ESOPHAGOGASTRODUODENOSCOPY     FOOT SURGERY Left 2007   neuroma removal, repeat 2008    There were no vitals filed for this visit.   Subjective Assessment - 06/03/21 1405     Subjective Patient reports her foot continues to bother her. Has been compliant with HEP.    Pertinent History Sherry Oliver is a 55yoF who  sustained a thalamic CVA in March 2022, initially with weakness and incoordination deficits of Rt hemibody. PT evaluation in ED recommended OPPT services. Pt has not had any therapy services since DC from hospital, but has been awaiting scheduling for this evlauation. Pt has been working on self-guided fine motor coordination with childrens hand-writing activity books that she bought. Pt reports at this date, all appreciable deficits have resolved, pt back at baseline. Pt denies any recent falls, acute imbalance, asymmetrical weakness or changes to sensorium.    Limitations Walking;Standing    How long can you stand comfortably? 2 hours in hard soled shoes vs 30 min in soft soled shoes    How long can you walk comfortably? 2 hours    Patient Stated Goals Improve the pain in the left foot.    Currently in Pain? Yes    Pain Score 4     Pain Location Foot    Pain Orientation Left    Pain Descriptors / Indicators Aching    Pain Type Chronic pain    Pain Onset More than a month ago    Pain Frequency Constant                 Treatment provided this session   Manual therapy iASTM with metal tool  to plantar surface of L foot x 8 minutes   PT performed PROM to toes in flexion/extension in tolerated ROM PT also performed passive stretch to MTP joints with splaying toes. Patient reports mild discomfort but states its not too bad Talocrural AP mobilizations grade II, x 3 minutes with increasing dorsiflexion with repetition    PT identified hypomobility along distal interphalangeal joint of 2nd toe. PT performed grade II-III joint mobs AP/PA 10 sec bouts x4 reps each with passive flexion/extension; She reports pain in distal foot with passive extension;   Education on toe spacing interventions including use of towel/wash cloth, patient demonstrated understanding and verbalized relief of some symptoms  Trigger Point Dry Needling (TDN), unbilled Education performed with patient regarding  potential benefit of TDN. Reviewed precautions and risks with patient. Reviewed special precautions/risks over lung fields which include pneumothorax. Reviewed signs and symptoms of pneumothorax and advised pt to go to ER immediately if these symptoms develop advise them of dry needling treatment. Extensive time spent with pt to ensure full understanding of TDN risks. Pt provided verbal consent to treatment. TDN performed to  with 0.25 x 40 single needle placements with local twitch response (LTR). Pistoning technique utilized. Improved pain-free motion following intervention. Interventions targeted gastroc/soleus, as well as FDL. X 14 minutes     Patient educated on and tolerated TDN interventions. Educated on potential side affects as well as use of toe spacers for symptoms relief after work. Multiple triger points noted throughout session and reduced by end of session. Patient to schedule follow up visit if TDN is successful with pain reduction. Patient will continue to benefit from skilled physical therapy in order to improve her pain quality of life and overall function with less pain                 PT Education - 06/03/21 1407     Education Details exercise technique, toe spacing, TDN    Person(s) Educated Patient    Methods Explanation;Demonstration;Tactile cues;Verbal cues    Comprehension Verbalized understanding;Returned demonstration;Verbal cues required;Tactile cues required              PT Short Term Goals - 05/01/21 1455       PT SHORT TERM GOAL #1   Title Patient will increase FOTO score by greater than 5 points  to demonstrate statistically significant improvement in mobility and quality of life.    Baseline Will complete at second appointment    Time 4    Period Weeks    Status New    Target Date 05/29/21      PT SHORT TERM GOAL #2   Title Patient will be independent in home exercise program to improve strength/mobility for better functional independence  with ADLs.    Baseline Pt does not have HEP for her foot    Time 3    Period Weeks    Status New    Target Date 05/22/21               PT Long Term Goals - 05/01/21 1501       PT LONG TERM GOAL #1   Title Patient will increase FOTO score by 10 points in order to demonstrate statistically significant improvement in mobility and quality of life.    Baseline Will complete at second visit    Time 8    Period Weeks    Status New    Target Date 06/26/21      PT LONG TERM GOAL #  2   Title Patient will report a worst pain of 3/10 on VAS in    her left foot   to improve tolerance with ADLs and reduced symptoms with activities.    Baseline Pt reports intermittent pain of greater than 3/10 at times    Time 8    Period Weeks    Status New    Target Date 06/26/21                   Plan - 06/03/21 1415     Clinical Impression Statement Patient educated on and tolerated TDN interventions. Educated on potential side affects as well as use of toe spacers for symptoms relief after work. Multiple triger points noted throughout session and reduced by end of session. Patient to schedule follow up visit if TDN is successful with pain reduction. Patient will continue to benefit from skilled physical therapy in order to improve her pain quality of life and overall function with less pain    Personal Factors and Comorbidities Comorbidity 1;Time since onset of injury/illness/exacerbation    Comorbidities prior CVA (per imaging)    Examination-Activity Limitations Stand    Stability/Clinical Decision Making Stable/Uncomplicated    Rehab Potential Fair    PT Frequency 1x / week    PT Duration 8 weeks    PT Treatment/Interventions ADLs/Self Care Home Management;Gait training;Orthotic Fit/Training;Passive range of motion;Taping;Joint Manipulations;Balance training;Neuromuscular re-education;Therapeutic exercise;Therapeutic activities;Manual techniques;Dry needling;Iontophoresis 79m/ml  Dexamethasone    PT Next Visit Plan Dry needling for posterior tibialis muscle and soleus and gastroc as indicated.    PT Home Exercise Plan Added standing gastroc and soleus stretches.    Consulted and Agree with Plan of Care Patient             Patient will benefit from skilled therapeutic intervention in order to improve the following deficits and impairments:  Abnormal gait, Difficulty walking, Decreased activity tolerance  Visit Diagnosis: Pain in left foot  Difficulty in walking, not elsewhere classified  Muscle weakness (generalized)     Problem List Patient Active Problem List   Diagnosis Date Noted   Primary insomnia 02/01/2021   History of cerebrovascular accident (CVA) with residual deficit 12/06/2020   Essential hypertension 12/06/2020   Elevated hemoglobin A1c 04/20/2020   Thrombocytosis 04/20/2020   OSA on CPAP 11/17/2019   Former smoker 11/17/2019   Drug-induced constipation 11/17/2019   Obesity (BMI 30.0-34.9) 11/17/2019   Acquired hypothyroidism 11/17/2019   Drug-induced myopathy 11/17/2019   Mixed hyperlipidemia 11/17/2019   Restless leg syndrome 11/17/2019   Cent[place]obular [place]ysema ([place] 11/17/2019   Family history of BRCA2 gene positive 11/17/2019    MJanna Arch PT, DPT  06/03/2021, 2:16 PM  [place] [facility]CSERVICES [address] [address]Phone: [phone]  Fax:  [phone] Name: CSHEPHANIE ROMASMRN: [id number] of Birth: 4Sep 24, 1962

## 2021-06-05 ENCOUNTER — Encounter: Payer: BC Managed Care – PPO | Admitting: Occupational Therapy

## 2021-06-05 ENCOUNTER — Ambulatory Visit: Payer: BC Managed Care – PPO

## 2021-06-09 ENCOUNTER — Telehealth: Payer: Self-pay | Admitting: *Deleted

## 2021-06-09 DIAGNOSIS — E7800 Pure hypercholesterolemia, unspecified: Secondary | ICD-10-CM

## 2021-06-09 NOTE — Telephone Encounter (Signed)
-----   Message from [place] D[place], [place] sent at 06/09/2021  3:29 PM EDT ----- Regarding: Outside Order Of Calcium Score Hi,  I got an outside order for a calcium score from Dr. Aloha Gell [facility] for the attached patient. The Diagnosis is Pure Hypercholesterolemia (E78.00). Can you please place an order under Dr. Farris Has for this Calcium Score? I will forward the report to Dr. Ubaldo Glassing.    Thanks!

## 2021-06-09 NOTE — Telephone Encounter (Signed)
Order for Calcium Score placed under Dr. Audie Box, as requested by our CT dept.  This is an outside MD Dr. Ubaldo Glassing, requesting this test be done.  Order placed and CT Dept to follow-up with the pt in scheduling this appt.

## 2021-06-10 ENCOUNTER — Encounter: Payer: BC Managed Care – PPO | Admitting: Occupational Therapy

## 2021-06-10 ENCOUNTER — Ambulatory Visit: Payer: BC Managed Care – PPO

## 2021-06-12 ENCOUNTER — Ambulatory Visit: Payer: BC Managed Care – PPO

## 2021-06-12 ENCOUNTER — Encounter: Payer: BC Managed Care – PPO | Admitting: Occupational Therapy

## 2021-06-16 ENCOUNTER — Other Ambulatory Visit: Payer: Self-pay

## 2021-06-16 DIAGNOSIS — Z1159 Encounter for screening for other viral diseases: Secondary | ICD-10-CM

## 2021-06-16 DIAGNOSIS — I10 Essential (primary) hypertension: Secondary | ICD-10-CM

## 2021-06-16 DIAGNOSIS — E039 Hypothyroidism, unspecified: Secondary | ICD-10-CM

## 2021-06-16 DIAGNOSIS — E782 Mixed hyperlipidemia: Secondary | ICD-10-CM

## 2021-06-16 DIAGNOSIS — Z0000 Encounter for general adult medical examination without abnormal findings: Secondary | ICD-10-CM

## 2021-06-16 DIAGNOSIS — R7309 Other abnormal glucose: Secondary | ICD-10-CM

## 2021-06-17 ENCOUNTER — Other Ambulatory Visit: Payer: BC Managed Care – PPO

## 2021-06-17 ENCOUNTER — Encounter: Payer: BC Managed Care – PPO | Admitting: Occupational Therapy

## 2021-06-17 ENCOUNTER — Ambulatory Visit: Payer: BC Managed Care – PPO

## 2021-06-17 DIAGNOSIS — R7309 Other abnormal glucose: Secondary | ICD-10-CM | POA: Diagnosis not present

## 2021-06-17 DIAGNOSIS — Z1159 Encounter for screening for other viral diseases: Secondary | ICD-10-CM | POA: Diagnosis not present

## 2021-06-17 DIAGNOSIS — E039 Hypothyroidism, unspecified: Secondary | ICD-10-CM | POA: Diagnosis not present

## 2021-06-17 DIAGNOSIS — I10 Essential (primary) hypertension: Secondary | ICD-10-CM | POA: Diagnosis not present

## 2021-06-18 LAB — HEMOGLOBIN A1C
Hgb A1c MFr Bld: 6 % of total Hgb — ABNORMAL HIGH (ref <5.7)
Mean Plasma Glucose: 126 mg/dL
eAG (mmol/L): 7 mmol/L

## 2021-06-18 LAB — COMPLETE METABOLIC PANEL WITH GFR
AG Ratio: 1.4 (calc) (ref 1.0–2.5)
ALT: 27 U/L (ref 6–29)
AST: 24 U/L (ref 10–35)
Albumin: 4.6 g/dL (ref 3.6–5.1)
Alkaline phosphatase (APISO): 68 U/L (ref 37–153)
BUN: 18 mg/dL (ref 7–25)
CO2: 28 mmol/L (ref 20–32)
Calcium: 9.9 mg/dL (ref 8.6–10.4)
Chloride: 100 mmol/L (ref 98–110)
Creat: 0.92 mg/dL (ref 0.50–1.05)
Globulin: 3.3 g/dL (calc) (ref 1.9–3.7)
Glucose, Bld: 94 mg/dL (ref 65–99)
Potassium: 3.7 mmol/L (ref 3.5–5.3)
Sodium: 140 mmol/L (ref 135–146)
Total Bilirubin: 0.4 mg/dL (ref 0.2–1.2)
Total Protein: 7.9 g/dL (ref 6.1–8.1)
eGFR: 70 mL/min/{1.73_m2} (ref >=60)

## 2021-06-18 LAB — CBC WITH DIFFERENTIAL/PLATELET
Absolute Monocytes: 933 cells/uL (ref 200–950)
Basophils Absolute: 79 cells/uL (ref 0–200)
Basophils Relative: 0.9 %
Eosinophils Absolute: 238 cells/uL (ref 15–500)
Eosinophils Relative: 2.7 %
HCT: 40.6 % (ref 35.0–45.0)
Hemoglobin: 13 g/dL (ref 11.7–15.5)
Lymphs Abs: 1698 cells/uL (ref 850–3900)
MCH: 29.7 pg (ref 27.0–33.0)
MCHC: 32 g/dL (ref 32.0–36.0)
MCV: 92.7 fL (ref 80.0–100.0)
MPV: 10.7 fL (ref 7.5–12.5)
Monocytes Relative: 10.6 %
Neutro Abs: 5852 cells/uL (ref 1500–7800)
Neutrophils Relative %: 66.5 %
Platelets: 432 10*3/uL — ABNORMAL HIGH (ref 140–400)
RBC: 4.38 10*6/uL (ref 3.80–5.10)
RDW: 12.8 % (ref 11.0–15.0)
Total Lymphocyte: 19.3 %
WBC: 8.8 10*3/uL (ref 3.8–10.8)

## 2021-06-18 LAB — HEPATITIS C ANTIBODY
Hepatitis C Ab: NONREACTIVE
SIGNAL TO CUT-OFF: 0.02 (ref <1.00)

## 2021-06-18 LAB — LIPID PANEL
Cholesterol: 244 mg/dL — ABNORMAL HIGH (ref <200)
HDL: 49 mg/dL — ABNORMAL LOW (ref >=50)
LDL Cholesterol (Calc): 147 mg/dL (calc) — ABNORMAL HIGH
Non-HDL Cholesterol (Calc): 195 mg/dL (calc) — ABNORMAL HIGH (ref <130)
Total CHOL/HDL Ratio: 5 (calc) — ABNORMAL HIGH (ref <5.0)
Triglycerides: 291 mg/dL — ABNORMAL HIGH (ref <150)

## 2021-06-18 LAB — T4, FREE: Free T4: 1 ng/dL (ref 0.8–1.8)

## 2021-06-18 LAB — TSH: TSH: 2.76 mIU/L (ref 0.40–4.50)

## 2021-06-19 ENCOUNTER — Encounter: Payer: BC Managed Care – PPO | Admitting: Occupational Therapy

## 2021-06-19 ENCOUNTER — Ambulatory Visit: Payer: BC Managed Care – PPO

## 2021-06-24 ENCOUNTER — Ambulatory Visit: Payer: BC Managed Care – PPO

## 2021-06-24 ENCOUNTER — Other Ambulatory Visit: Payer: Self-pay

## 2021-06-24 ENCOUNTER — Encounter: Payer: BC Managed Care – PPO | Admitting: Occupational Therapy

## 2021-06-24 ENCOUNTER — Encounter: Payer: Self-pay | Admitting: Family Medicine

## 2021-06-24 ENCOUNTER — Ambulatory Visit (INDEPENDENT_AMBULATORY_CARE_PROVIDER_SITE_OTHER): Payer: BC Managed Care – PPO | Admitting: Family Medicine

## 2021-06-24 VITALS — BP 146/81 | HR 86 | Ht 62.5 in | Wt 174.0 lb

## 2021-06-24 DIAGNOSIS — Z0000 Encounter for general adult medical examination without abnormal findings: Secondary | ICD-10-CM | POA: Diagnosis not present

## 2021-06-24 DIAGNOSIS — Z23 Encounter for immunization: Secondary | ICD-10-CM

## 2021-06-24 DIAGNOSIS — E039 Hypothyroidism, unspecified: Secondary | ICD-10-CM | POA: Diagnosis not present

## 2021-06-24 DIAGNOSIS — J432 Centrilobular emphysema: Secondary | ICD-10-CM

## 2021-06-24 DIAGNOSIS — I10 Essential (primary) hypertension: Secondary | ICD-10-CM

## 2021-06-24 DIAGNOSIS — Z9989 Dependence on other enabling machines and devices: Secondary | ICD-10-CM

## 2021-06-24 DIAGNOSIS — E782 Mixed hyperlipidemia: Secondary | ICD-10-CM

## 2021-06-24 DIAGNOSIS — R7309 Other abnormal glucose: Secondary | ICD-10-CM | POA: Diagnosis not present

## 2021-06-24 DIAGNOSIS — G4733 Obstructive sleep apnea (adult) (pediatric): Secondary | ICD-10-CM

## 2021-06-24 NOTE — Patient Instructions (Addendum)
Thank you for coming to the office today.  Call this location ([facility] office in [place], they should be able to do the CT at their office) they have already received the order but it does not appear to be scheduled. It is ordered under Dr Ubaldo Glassing.  [place] [facility] [place],  [place]  [address] Main: [phone]  Cholesterol is mild elevated but hopeful that the medication can fix it when they get it approved for you  If still not able to get it approved from Dr Ubaldo Glassing, let me know.  Recent Labs    12/02/20 0356 03/14/21 0818 06/17/21 0901  HGBA1C 6.2* 5.9* 6.0*    Sleep Hygiene Recommendations to promote healthy sleep in all patients, especially if symptoms of insomnia are worsening. Due to the nature of sleep rhythms, if your body gets "out of rhythm", it may take some time before your sleep cycle can be "reset".  Please try to follow as many of the following tips as you can, usually there are only a few of these are the primary cause of the problem.  ?To reset your sleep rhythm, go to bed and get up at the same time every day ?Sleep only long enough to feel rested and then get out of bed ?Do not try to force yourself to sleep. If you can't sleep, get out of bed and try again later. ?Avoid naps during the day, unless excessively tired. The more sleeping during the day, then the less sleep your body needs at night.  ?Have coffee, tea, and other foods that have caffeine only in the morning ?Exercise several days a week, but not right before bed ?If you drink alcohol, prefer to have appropriate drink with one meal, but prefer to avoid alcohol in the evening, and bedtime ?If you smoke, avoid smoking, especially in the evening  ?Avoid watching TV or looking at phones, computers, or reading devices ("e-books") that give off light at least 30 minutes before bed. This artificial light sends "awake signals" to your brain and can  make it harder to fall asleep. ?Make your bedroom a comfortable place where it is easy to fall asleep: Put up shades or special blackout curtains to block light from outside. Use a white noise machine to block noise. Keep the temperature cool. ?Try your best to solve or at least address your problems before you go to bed ?Use relaxation techniques to manage stress. Ask your health care provider to suggest some techniques that may work well for you. These may include: Breathing exercises. Routines to release muscle tension. Visualizing peaceful scenes.  Consider other natural sleep remedy / vitamin or gummy support.   Please schedule a Follow-up Appointment to: Return in about 6 months (around 12/23/2021) for 6 month follow-up PreDM A1c, Insomnia/OSA, cholesterol HTN updates.  If you have any other questions or concerns, please feel free to call the office or send a message through [place]. You may also schedule an earlier appointment if necessary.  Additionally, you may be receiving a survey about your experience at our office within a few days to 1 week by e-mail or mail. We value your feedback.  Nobie Putnam, DO [place]

## 2021-06-24 NOTE — Progress Notes (Signed)
Subjective:    Patient ID: Sherry Oliver, female    DOB: [birth year], 60 y.o.   MRN: [id number]  Sherry Oliver is a 60 y.o. female presenting on 06/24/2021 for Annual Exam   HPI    Already has a CPAP for OSA Difficulty falling asleep and staying asleep Never on rx medication On med for RLS but still cant sleep Feels more fatigued and tired sleepy   CHRONIC HTN: Reports occasional home BP checks. Has intermittent elevation, admits stressors today. Current Meds - none currently Lifestyle: - Diet: admits some sodium / caffeine in diet, not always hydrating - Exercise: not as active due to knee pain Denies CP, dyspnea, HA, edema, dizziness / lightheadedness   HYPERLIPIDEMIA: - Reports history issues of high cholesterol and failed statins due to myalgia. Followed by [facility] Dr Ubaldo Glassing and they ordered a CT Cardiac score test Admits weight gain Elevated Lipids off statin - Niacin - Not on Fish Oil - Rosuvastatin (crestor), Pitavstatin, Atorvastatin (lipitor), Simvastatin (zocor) Asking about herbal options   Thrombocytosis Prior history back to 2009 reviewing past records with CBG range of PLT 400-440 No new concerns Admits inflammation    Pre-Diabetes A1c now 6.0, previous range 5.9 to 6.2 Meds: none Not on ACEi ARB - Diet admits poor diet lately still but goal to improve - Exercise (Limited by by exercise due to knee pain) Denies hypoglycemia, polyuria, visual changes, numbness or tingling.   Hypothyroidism Chronic problem for >30 years. History of temperature instability and lab test showed problem. Last lab TSH and T4 normal. Currently taking Levothyroxine 29mg daily and Cytomel 58mdaily, no change in dose and no symptoms.   GERD Taking Nexium 206mTC BID    Former Smoker   OSA on CPAP Chronic problem. Has been doing well with CPAP using nightly. She does have complications with RLS and periodic leg movement overnight and some "twitching"  with skin feeling like it was crawling. CPAP was adjusted and that has helped her sleep at night. She has tried Reqiup with intermittent results. She had L leg muscle twitching. Now doing better on Mirapex 1.5 quarter tab nightly. Difficulty sleeping, if lay on back breathing impacting her, they are adjusting the CPAP settings. She will talk to pulm about new machine. - On Melatonin not helping   Eczema / Nail Pitting Now followed [place] [facility]  Additional update Mother has been diagnosed with bladder cancer again and she will start chemo again and CinJenny Reichmanns had some increased stress with this.   Health Maintenance:   Breast Cancer Screening: Left sided lymph node swollen under L arm axilla, then repeat mammogram had another lymph node. Her mother has BRCA 2 gene and had double mastectomy around age 76.29he proceeded with lymph node biopsy, ultimately it was benign thought to be reactive. Followed w/ Mammo      Cervical Cancer Screening - Mother has BRCA 2 - hysterectomy found uterine cancer. Patient has had pap smears, last one 01/04/18 [facility], negative intraepithelial malignancy and negative High Risk HPV, good for up to 5 years. Next due in 2024   Colonoscopy: done 03/24/18, no polyps, repeat within 5 years, report reviewed.   FUture COVID Booster omicron variant coverage when ready  Flu Shot today    [facility] 06/24/2021 01/31/2021 04/19/2020  Decreased Interest 1 2 0  Down, Depressed, Hopeless 1 1 0  PHQ - 2 Score 2  3 0  Altered sleeping 2 2 -  Tired, decreased energy 2 3 -  Change in appetite 2 0 -  Feeling bad or failure about yourself  1 1 -  Trouble concentrating 2 1 -  Moving slowly or fidgety/restless 0 0 -  Suicidal thoughts 0 0 -  PHQ-9 Score 11 10 -  Difficult doing work/chores Very difficult Somewhat difficult -    Past Medical History:  Diagnosis Date   Allergy    Gastropathy    GERD (gastroesophageal reflux  disease)    Headache    Hyperlipidemia    Hypertension    Hypothyroid    Lumbar radiculopathy    Lung nodule    Sleep apnea    Past Surgical History:  Procedure Laterality Date   APPENDECTOMY  2008   [place]   bladder stem   BREAST BIOPSY Left 06/06/2019   Lymph node US Bx, hydromarker. REACTIVE LYMPH NODE, FAVOR BENIGN   COLONOSCOPY WITH PROPOFOL N/A 02/19/2015   Procedure: COLONOSCOPY WITH PROPOFOL;  Surgeon: Lollie Sails, MD;  Location[facility] ENDOSCOPY;  Service: Endoscopy;  Laterality: N/A;   COLONOSCOPY WITH PROPOFOL N/A 03/24/2018   Procedure: COLONOSCOPY WITH PROPOFOL;  Surgeon: Lollie Sails, MD;  Location: [facility] ENDOSCOPY;  Service: Endoscopy;  Laterality: N/A;   ELBOW SURGERY Right 1982   ESOPHAGOGASTRODUODENOSCOPY     FOOT SURGERY Left 2007   neuroma removal, repeat 2008   Social History   Socioeconomic History   Marital status: Single    Spouse name: Not on file   Number of children: Not on file   Years of education: Not on file   Highest education level: Not on file  Occupational History   Not on file  Tobacco Use   Smoking status: Former    Types: Cigarettes    Quit date: 03/14/2018    Years since quitting: 3.2   Smokeless tobacco: Former  Scientific laboratory technician Use: Never used  Substance and Sexual Activity   Alcohol use: Yes    Comment: rare   Drug use: Not Currently   Sexual activity: Not on file  Other Topics Concern   Not on file  Social History Narrative   Not on file   Social Determinants of Health   Financial Resource Strain: Not on file  Food Insecurity: Not on file  Transportation Needs: Not on file  Physical Activity: Not on file  Stress: Not on file  Social Connections: Not on file  Intimate Partner Violence: Not on file   Family History  Problem Relation Age of Onset   Breast cancer Mother 65   BRCA 1/2 Mother        positive for Brca 2   Cancer Mother        bladder   Breast cancer Maternal Aunt 58    Breast cancer Maternal Grandfather        70's   Alcohol abuse Father    Lung cancer Father 60       mets to brain   Heart disease Maternal Grandmother    Stroke Paternal Grandmother    Current Outpatient Medications on File Prior to Visit  Medication Sig   acetaminophen (TYLENOL) 500 MG tablet Take by mouth.   Ascorbic Acid (VITAMIN C) 1000 MG tablet Take 1,000 mg by mouth daily.   aspirin 81 MG chewable tablet Chew 1 tablet (81 mg total) by mouth daily.   b complex vitamins tablet Take 1 tablet by  mouth daily.   Calcium Carbonate-Vitamin D 600-400 MG-UNIT per tablet Take 1 tablet by mouth daily.   clotrimazole-betamethasone (LOTRISONE) cream Apply topically 2 (two) times daily.   desoximetasone (TOPICORT) 0.25 % cream Apply to affected areas one to two times daily as needed until rash improved. Avoid applying to face, groin, and axilla.   DUPIXENT 300 MG/2ML SOPN INJECT 300 MG (1 PEN) UNDER THE SKIN EVERY 2 WEEKS   esomeprazole (NEXIUM) 20 MG capsule Take 20 mg by mouth in the morning and at bedtime.   fluticasone (FLONASE) 50 MCG/ACT nasal spray Place 2 sprays into both nostrils daily.   hydrochlorothiazide (HYDRODIURIL) 25 MG tablet Take 1 tablet (25 mg total) by mouth daily.   ibuprofen (ADVIL,MOTRIN) 200 MG tablet Take 400 mg by mouth 2 (two) times daily.   levothyroxine (SYNTHROID) 88 MCG tablet Take 1 tablet (88 mcg total) by mouth daily before breakfast.   liothyronine (CYTOMEL) 5 MCG tablet TAKE 1 TABLET DAILY   montelukast (SINGULAIR) 10 MG tablet Take 10 mg by mouth daily.   Multiple Vitamin (MULTIVITAMIN WITH MINERALS) TABS tablet Take 1 tablet by mouth daily.   pramipexole (MIRAPEX) 1.5 MG tablet Take 0.375 mg by mouth at bedtime. Quarter tab nightly    pravastatin (PRAVACHOL) 20 MG tablet Take 1 tablet (20 mg total) by mouth daily.   amLODipine (NORVASC) 5 MG tablet Take 1 tablet by mouth daily. (Patient not taking: No sig reported)   clopidogrel (PLAVIX) 75 MG tablet Take  1 tablet (75 mg total) by mouth daily. (Patient not taking: No sig reported)   diclofenac Sodium (VOLTAREN) 1 % GEL Apply 2 g topically 3 (three) times daily as needed. (Patient not taking: No sig reported)   Fluticasone-Umeclidin-Vilant (TRELEGY ELLIPTA) 100-62.5-25 MCG/INH AEPB Inhale into the lungs. (Patient not taking: No sig reported)   No current facility-administered medications on file prior to visit.    Review of Systems Per HPI unless specifically indicated above     Objective:    BP (!) 146/81   Pulse 86   Ht 5' 2.5" (1.588 m)   Wt 174 lb (78.9 kg)   SpO2 98%   BMI 31.32 kg/m   Wt Readings from Last 3 Encounters:  06/24/21 174 lb (78.9 kg)  03/24/21 173 lb 3.2 oz (78.6 kg)  01/31/21 175 lb 9.6 oz (79.7 kg)    Physical Exam Vitals and nursing note reviewed.  Constitutional:      General: She is not in acute distress.    Appearance: She is well-developed. She is not diaphoretic.     Comments: Well-appearing, comfortable, cooperative  HENT:     Head: Normocephalic and atraumatic.  Eyes:     General:        Right eye: No discharge.        Left eye: No discharge.     Conjunctiva/sclera: Conjunctivae normal.  Neck:     Thyroid: No thyromegaly.  Cardiovascular:     Rate and Rhythm: Normal rate and regular rhythm.     Heart sounds: Normal heart sounds. No murmur heard. Pulmonary:     Effort: Pulmonary effort is normal. No respiratory distress.     Breath sounds: Normal breath sounds. No wheezing or rales.  Musculoskeletal:        General: Normal range of motion.     Cervical back: Normal range of motion and neck supple.  Lymphadenopathy:     Cervical: No cervical adenopathy.  Skin:    General: Skin is warm  and dry.     Findings: No erythema or rash.  Neurological:     Mental Status: She is alert and oriented to person, place, and time.  Psychiatric:        Behavior: Behavior normal.     Comments: Well groomed, good eye contact, normal speech and thoughts    Results for orders placed or performed in visit on 06/16/21  T4, free  Result Value Ref Range   Free T4 1.0 0.8 - 1.8 ng/dL  Hepatitis C antibody  Result Value Ref Range   Hepatitis C Ab NON-REACTIVE NON-REACTIVE   SIGNAL TO CUT-OFF 0.02 <1.00  TSH  Result Value Ref Range   TSH 2.76 0.40 - 4.50 mIU/L  Hemoglobin A1c  Result Value Ref Range   Hgb A1c MFr Bld 6.0 (H) <5.7 % of total Hgb   Mean Plasma Glucose 126 mg/dL   eAG (mmol/L) 7.0 mmol/L  CBC with Differential/Platelet  Result Value Ref Range   WBC 8.8 3.8 - 10.8 Thousand/uL   RBC 4.38 3.80 - 5.10 Million/uL   Hemoglobin 13.0 11.7 - 15.5 g/dL   HCT 40.6 35.0 - 45.0 %   MCV 92.7 80.0 - 100.0 fL   MCH 29.7 27.0 - 33.0 pg   MCHC 32.0 32.0 - 36.0 g/dL   RDW 12.8 11.0 - 15.0 %   Platelets 432 (H) 140 - 400 Thousand/uL   MPV 10.7 7.5 - 12.5 fL   Neutro Abs 5,852 1,500 - 7,800 cells/uL   Lymphs Abs 1,698 850 - 3,900 cells/uL   Absolute Monocytes 933 200 - 950 cells/uL   Eosinophils Absolute 238 15 - 500 cells/uL   Basophils Absolute 79 0 - 200 cells/uL   Neutrophils Relative % 66.5 %   Total Lymphocyte 19.3 %   Monocytes Relative 10.6 %   Eosinophils Relative 2.7 %   Basophils Relative 0.9 %  Lipid panel  Result Value Ref Range   Cholesterol 244 (H) <200 mg/dL   HDL 49 (L) > OR = 50 mg/dL   Triglycerides 291 (H) <150 mg/dL   LDL Cholesterol (Calc) 147 (H) mg/dL (calc)   Total CHOL/HDL Ratio 5.0 (H) <5.0 (calc)   Non-HDL Cholesterol (Calc) 195 (H) <130 mg/dL (calc)  COMPLETE METABOLIC PANEL WITH GFR  Result Value Ref Range   Glucose, Bld 94 65 - 99 mg/dL   BUN 18 7 - 25 mg/dL   Creat 0.92 0.50 - 1.05 mg/dL   eGFR 70 > OR = 60 mL/min/1.11m2   BUN/Creatinine Ratio NOT APPLICABLE 6 - 22 (calc)   Sodium 140 135 - 146 mmol/L   Potassium 3.7 3.5 - 5.3 mmol/L   Chloride 100 98 - 110 mmol/L   CO2 28 20 - 32 mmol/L   Calcium 9.9 8.6 - 10.4 mg/dL   Total Protein 7.9 6.1 - 8.1 g/dL   Albumin 4.6 3.6 - 5.1 g/dL    Globulin 3.3 1.9 - 3.7 g/dL (calc)   AG Ratio 1.4 1.0 - 2.5 (calc)   Total Bilirubin 0.4 0.2 - 1.2 mg/dL   Alkaline phosphatase (APISO) 68 37 - 153 U/L   AST 24 10 - 35 U/L   ALT 27 6 - 29 U/L      Assessment & Plan:   Problem List Items Addressed This Visit     OSA on CPAP    Issue still with insomnia vs poor sleep Will return to Pulm for CPAP eval  Chronic OSA on CPAP - Good adherence to CPAP nightly -  Continue current CPAP therapy, patient seems to be benefiting from therapy       Mixed hyperlipidemia    Prior failed statins - Simvastatin, Atorvastatin, Rosuvastatin, Pitavastatin, Pravastatin History of CVA, on statin for secondary risk reduction  Follow up with Dr Ubaldo Glassing [facility] Cards, and upcoming CT Coronary Scan [facility], anticipate will qualify for PCSK9 if need future assistance can refer to Lipid clinic / CCM      Essential hypertension    Controlled - Home BP readings   Complication w/ CVA history  Off Amlodipine due to swelling  Plan:  1. Continue current BP regimen HCTZ 74m daily - chemistry reviewed, normal K Cr and Na. 2. Encourage improved lifestyle - low sodium diet, regular exercise 3. Continue monitor BP outside office, bring readings to next visit, if persistently >140/90 or new symptoms notify office sooner      Elevated hemoglobin A1c    Stable A1c 5.9 to 6.0      Centrilobular emphysema (HCC)   Acquired hypothyroidism    Controlled on current regimen Last lab TSH T4 Normal Currently taking Levothyroxine 853m daily and Cytomel 56m656maily      Other Visit Diagnoses     Annual physical exam    -  Primary   Needs flu shot       Relevant Orders   Flu Vaccine QUAD 23mo73mo(Fluarix, Fluzone & Alfiuria Quad PF) (Completed)       Updated Health Maintenance information Reviewed recent lab results with patient Encouraged improvement to lifestyle with diet and exercise Goal of weight loss   No orders of the defined types were placed in this  encounter.    Follow up plan: Return in about 6 months (around 12/23/2021) for 6 month follow-up PreDM A1c, Insomnia/OSA, cholesterol HTN updates.  AlexNobie Putnam [place] 06/24/2021, 3:16 PM

## 2021-06-25 NOTE — Assessment & Plan Note (Signed)
Issue still with insomnia vs poor sleep Will return to Pulm for CPAP eval  Chronic OSA on CPAP - Good adherence to CPAP nightly - Continue current CPAP therapy, patient seems to be benefiting from therapy

## 2021-06-25 NOTE — Assessment & Plan Note (Signed)
Stable A1c 5.9 to 6.0

## 2021-06-25 NOTE — Assessment & Plan Note (Signed)
Controlled - Home BP readings   Complication w/ CVA history  Off Amlodipine due to swelling  Plan:  1. Continue current BP regimen HCTZ 25mg daily - chemistry reviewed, normal K Cr and Na. 2. Encourage improved lifestyle - low sodium diet, regular exercise 3. Continue monitor BP outside office, bring readings to next visit, if persistently >140/90 or new symptoms notify office sooner 

## 2021-06-25 NOTE — Assessment & Plan Note (Signed)
Controlled on current regimen Last lab TSH T4 Normal Currently taking Levothyroxine 88mcg daily and Cytomel 5mg daily 

## 2021-06-25 NOTE — Assessment & Plan Note (Signed)
Prior failed statins - Simvastatin, Atorvastatin, Rosuvastatin, Pitavastatin, Pravastatin History of CVA, on statin for secondary risk reduction  Follow up with Dr Ubaldo Glassing [facility] Cards, and upcoming CT Coronary Scan [facility], anticipate will qualify for PCSK9 if need future assistance can refer to Lipid clinic / CCM

## 2021-06-26 ENCOUNTER — Encounter: Payer: BC Managed Care – PPO | Admitting: Occupational Therapy

## 2021-06-26 ENCOUNTER — Ambulatory Visit: Payer: BC Managed Care – PPO

## 2021-07-01 ENCOUNTER — Ambulatory Visit: Payer: BC Managed Care – PPO

## 2021-07-01 ENCOUNTER — Encounter: Payer: BC Managed Care – PPO | Admitting: Occupational Therapy

## 2021-07-02 DIAGNOSIS — Z9989 Dependence on other enabling machines and devices: Secondary | ICD-10-CM | POA: Diagnosis not present

## 2021-07-02 DIAGNOSIS — G4733 Obstructive sleep apnea (adult) (pediatric): Secondary | ICD-10-CM | POA: Diagnosis not present

## 2021-07-03 ENCOUNTER — Encounter: Payer: BC Managed Care – PPO | Admitting: Occupational Therapy

## 2021-07-03 ENCOUNTER — Ambulatory Visit: Payer: BC Managed Care – PPO

## 2021-07-08 ENCOUNTER — Encounter: Payer: BC Managed Care – PPO | Admitting: Occupational Therapy

## 2021-07-08 ENCOUNTER — Ambulatory Visit: Payer: BC Managed Care – PPO

## 2021-07-08 DIAGNOSIS — D2372 Other benign neoplasm of skin of left lower limb, including hip: Secondary | ICD-10-CM | POA: Diagnosis not present

## 2021-07-08 DIAGNOSIS — M79672 Pain in left foot: Secondary | ICD-10-CM | POA: Diagnosis not present

## 2021-07-08 DIAGNOSIS — M216X2 Other acquired deformities of left foot: Secondary | ICD-10-CM | POA: Diagnosis not present

## 2021-07-08 DIAGNOSIS — M2012 Hallux valgus (acquired), left foot: Secondary | ICD-10-CM | POA: Diagnosis not present

## 2021-07-08 DIAGNOSIS — M19071 Primary osteoarthritis, right ankle and foot: Secondary | ICD-10-CM | POA: Diagnosis not present

## 2021-07-10 ENCOUNTER — Encounter: Payer: BC Managed Care – PPO | Admitting: Occupational Therapy

## 2021-07-10 ENCOUNTER — Ambulatory Visit: Payer: BC Managed Care – PPO

## 2021-07-18 ENCOUNTER — Other Ambulatory Visit: Payer: Self-pay | Admitting: Dermatology

## 2021-07-21 ENCOUNTER — Other Ambulatory Visit: Payer: Self-pay

## 2021-07-21 ENCOUNTER — Ambulatory Visit: Payer: BC Managed Care – PPO | Attending: Neurology

## 2021-07-21 DIAGNOSIS — M79672 Pain in left foot: Secondary | ICD-10-CM | POA: Insufficient documentation

## 2021-07-21 DIAGNOSIS — R262 Difficulty in walking, not elsewhere classified: Secondary | ICD-10-CM | POA: Insufficient documentation

## 2021-07-21 DIAGNOSIS — M6281 Muscle weakness (generalized): Secondary | ICD-10-CM | POA: Insufficient documentation

## 2021-07-21 NOTE — Therapy (Signed)
[place] MAIN [facility] SERVICES [address] [place], [place], [address] Phone: [phone]   Fax:  [phone]  Physical Therapy Treatment/RECERT  Patient Details  Name: Sherry Oliver MRN: [id number] Date of Birth: 06/10/1961 Referring Provider (PT): Jennings Books   Encounter Date: 07/21/2021   PT End of Session - 07/21/21 0948     Visit Number 6    Number of Visits 14    Date for PT Re-Evaluation 09/15/21    Authorization Type BCBS, 40VL anually PT/OT/Chiro    Progress Note Due on Visit 10    PT Start Time 0844    PT Stop Time 0929    PT Time Calculation (min) 45 min    Activity Tolerance Patient tolerated treatment well    Behavior During Therapy [facility] for tasks assessed/performed             Past Medical History:  Diagnosis Date   Allergy    Gastropathy    GERD (gastroesophageal reflux disease)    Headache    Hyperlipidemia    Hypertension    Hypothyroid    Lumbar radiculopathy    Lung nodule    Sleep apnea     Past Surgical History:  Procedure Laterality Date   APPENDECTOMY  2008   [place]   bladder stem   BREAST BIOPSY Left 06/06/2019   Lymph node US Bx, hydromarker. REACTIVE LYMPH NODE, FAVOR BENIGN   COLONOSCOPY WITH PROPOFOL N/A 02/19/2015   Procedure: COLONOSCOPY WITH PROPOFOL;  Surgeon: Lollie Sails, MD;  Location: [facility] ENDOSCOPY;  Service: Endoscopy;  Laterality: N/A;   COLONOSCOPY WITH PROPOFOL N/A 03/24/2018   Procedure: COLONOSCOPY WITH PROPOFOL;  Surgeon: Lollie Sails, MD;  Location: [facility] ENDOSCOPY;  Service: Endoscopy;  Laterality: N/A;   ELBOW SURGERY Right 1982   ESOPHAGOGASTRODUODENOSCOPY     FOOT SURGERY Left 2007   neuroma removal, repeat 2008    There were no vitals filed for this visit.   Subjective Assessment - 07/21/21 0946     Subjective Patient is returning to therapy after 2 month absence. Has had x rays, reports her physician said PT won't help but she wants  to try because he is recommending surgery. Had major dental work last week and has been on heavy duty pain medication. Reports the needling performed last PT session did help for a few days.    Pertinent History Sherry Oliver is a 66yoF who sustained a thalamic CVA in March 2022, initially with weakness and incoordination deficits of Rt hemibody. PT evaluation in ED recommended OPPT services. Pt has not had any therapy services since DC from hospital, but has been awaiting scheduling for this evlauation. Pt has been working on self-guided fine motor coordination with childrens hand-writing activity books that she bought. Pt reports at this date, all appreciable deficits have resolved, pt back at baseline. Pt denies any recent falls, acute imbalance, asymmetrical weakness or changes to sensorium.    Limitations Walking;Standing    How long can you stand comfortably? 2 hours in hard soled shoes vs 30 min in soft soled shoes    How long can you walk comfortably? 2 hours    Patient Stated Goals Improve the pain in the left foot.    Currently in Pain? Yes    Pain Score 2     Pain Location Foot    Pain Orientation Right;Left    Pain Descriptors / Indicators Aching    Pain Type Chronic pain  Pain Onset More than a month ago    Pain Frequency Constant                    RECERT Goals:  FOTO 52 HEP: compliant  VAS: 8/10    Treatment provided this session   Manual therapy iASTM with metal tool to plantar surface of L and R foot x 14 minutes    Education on toe spacing interventions including use of towel/wash cloth, patient demonstrated understanding and verbalized relief of some symptoms   Trigger Point Dry Needling (TDN), unbilled Education performed with patient regarding potential benefit of TDN. Reviewed precautions and risks with patient. Reviewed special precautions/risks over lung fields which include pneumothorax. Reviewed signs and symptoms of pneumothorax and advised  pt to go to ER immediately if these symptoms develop advise them of dry needling treatment. Extensive time spent with pt to ensure full understanding of TDN risks. Pt provided verbal consent to treatment. TDN performed to  with 0.25 x 40 single needle placements with local twitch response (LTR). Pistoning technique utilized. Improved pain-free motion following intervention. Interventions targeted gastroc/soleus,  FDL bilaterally  X 16 minutes      Pt educated throughout session about proper posture and technique with exercises. Improved exercise technique, movement at target joints, use of target muscles after min to mod verbal, visual, tactile cues.      Patient presents to physical therapy after ~2 months, since then she has had imaging showing bone spurs on dorsal aspect of R foot. Patient would like to continue therapy for pain reduction with a trail period. Her goals are addressed and patient will be recerted. She tolerates TDN and manual reporting decreased symptoms and tightness by end of session.  Patient will continue to benefit from skilled physical therapy in order to improve her pain quality of life and overall function with less pain            PT Education - 07/21/21 0947     Education Details recert, TDN, manual    Person(s) Educated Patient    Methods Explanation;Demonstration;Tactile cues;Verbal cues    Comprehension Verbalized understanding;Returned demonstration;Verbal cues required;Tactile cues required              PT Short Term Goals - 07/21/21 0949       PT SHORT TERM GOAL #1   Title Patient will increase FOTO score by greater than 5 points  to demonstrate statistically significant improvement in mobility and quality of life.    Baseline Will complete at second appointment : 11/7: 52%    Time 4    Period Weeks    Status Partially Met    Target Date 08/18/21      PT SHORT TERM GOAL #2   Title Patient will be independent in home exercise program to  improve strength/mobility for better functional independence with ADLs.    Baseline Pt does not have HEP for her foot 11/7: HEP compliant    Time 3    Period Weeks    Status Partially Met    Target Date 08/18/21               PT Long Term Goals - 07/21/21 0949       PT LONG TERM GOAL #1   Title Patient will increase FOTO score by 10 points in order to demonstrate statistically significant improvement in mobility and quality of life.    Baseline Will complete at second visit 11/7: 52%  Time 8    Period Weeks    Status Partially Met    Target Date 09/15/21      PT LONG TERM GOAL #2   Title Patient will report a worst pain of 3/10 on VAS in    her left foot   to improve tolerance with ADLs and reduced symptoms with activities.    Baseline Pt reports intermittent pain of greater than 3/10 at times 11/7: 8/10    Time 8    Period Weeks    Status On-going    Target Date 09/15/21      PT LONG TERM GOAL #3   Title Patient will be able to stand for >1 hour without pain increase of 3/10 in bilateral feet for increased tolerance for functional mobility and improved quality of life    Baseline 11/7: painful upon standing    Time 8    Period Weeks    Status New    Target Date 09/15/21                   Plan - 07/21/21 7341     Clinical Impression Statement Patient presents to physical therapy after ~2 months, since then she has had imaging showing bone spurs on dorsal aspect of R foot. Patient would like to continue therapy for pain reduction with a trail period. Her goals are addressed and patient will be recerted. She tolerates TDN and manual reporting decreased symptoms and tightness by end of session.  Patient will continue to benefit from skilled physical therapy in order to improve her pain quality of life and overall function with less pain    Personal Factors and Comorbidities Comorbidity 1;Time since onset of injury/illness/exacerbation    Comorbidities prior CVA  (per imaging)    Examination-Activity Limitations Stand;Carry;Locomotion Level;Lift;Stairs;Squat;Transfers    Examination-Participation Restrictions Building surveyor;Yard Work    Stability/Clinical Decision Making Stable/Uncomplicated    Rehab Potential Fair    PT Frequency 1x / week    PT Duration 8 weeks    PT Treatment/Interventions ADLs/Self Care Home Management;Gait training;Orthotic Fit/Training;Passive range of motion;Taping;Joint Manipulations;Balance training;Neuromuscular re-education;Therapeutic exercise;Therapeutic activities;Manual techniques;Dry needling;Iontophoresis 58m/ml Dexamethasone;Cryotherapy;Moist Heat;Traction;Ultrasound;Stair training;Functional mobility training;Splinting;Vestibular;Vasopneumatic Device;Visual/perceptual remediation/compensation;Energy conservation    PT Next Visit Plan Dry needling for posterior tibialis muscle and soleus and gastroc as indicated.    PT Home Exercise Plan Added standing gastroc and soleus stretches.    Consulted and Agree with Plan of Care Patient             Patient will benefit from skilled therapeutic intervention in order to improve the following deficits and impairments:  Abnormal gait, Difficulty walking, Decreased activity tolerance, Decreased range of motion, Decreased mobility, Decreased endurance, Increased fascial restricitons, Hypomobility, Impaired perceived functional ability, Impaired flexibility, Impaired sensation, Improper body mechanics, Pain  Visit Diagnosis: Pain in left foot  Difficulty in walking, not elsewhere classified  Muscle weakness (generalized)     Problem List Patient Active Problem List   Diagnosis Date Noted   Primary insomnia 02/01/2021   History of cerebrovascular accident (CVA) with residual deficit 12/06/2020   Essential hypertension 12/06/2020   Elevated hemoglobin A1c 04/20/2020   Thrombocytosis 04/20/2020   OSA on CPAP 11/17/2019   Former smoker  11/17/2019   Drug-induced constipation 11/17/2019   Obesity (BMI 30.0-34.9) 11/17/2019   Acquired hypothyroidism 11/17/2019   Drug-induced myopathy 11/17/2019   Mixed hyperlipidemia 11/17/2019   Restless leg syndrome 11/17/2019   Centrilobular emphysema ([place] 11/17/2019   Family history of BRCA2 gene  positive 11/17/2019   Janna Arch, PT, DPT  07/21/2021, 9:56 AM  [place] MAIN [facility] SERVICES [address] [place], [place], [address] Phone: [phone]   Fax:  [phone]  Name: Sherry [place] MRN: [id number] Date of Birth: 1961/09/01

## 2021-07-27 ENCOUNTER — Other Ambulatory Visit: Payer: Self-pay | Admitting: Family Medicine

## 2021-07-27 DIAGNOSIS — E039 Hypothyroidism, unspecified: Secondary | ICD-10-CM

## 2021-07-28 NOTE — Telephone Encounter (Signed)
May be 2 requests for this rx because my internet connection went out while refilling this request.  I did not go through.  Requested Prescriptions  Pending Prescriptions Disp Refills  . levothyroxine (SYNTHROID) 88 MCG tablet [Pharmacy Med Name: L-THYROXINE (SYNTHROID) TABS 88MCG] 90 tablet 3    Sig: TAKE 1 TABLET DAILY BEFORE BREAKFAST     Endocrinology:  Hypothyroid Agents Failed - 07/27/2021 11:58 AM      Failed - TSH needs to be rechecked within 3 months after an abnormal result. Refill until TSH is due.      Passed - TSH in normal range and within 360 days    TSH  Date Value Ref Range Status  06/17/2021 2.76 0.40 - 4.50 mIU/L Final         Passed - Valid encounter within last 12 months    Recent Outpatient Visits          1 month ago Annual physical exam   [place], DO   4 months ago Mixed hyperlipidemia   [place], DO   5 months ago Essential hypertension   [place], DO   7 months ago History of cerebrovascular accident (CVA) with residual deficit   [place], DO   1 year ago Pain and swelling of left knee   [place], DO      Future Appointments            In 4 months Brendolyn Patty, MD [place]

## 2021-07-28 NOTE — Telephone Encounter (Signed)
Requested Prescriptions  Pending Prescriptions Disp Refills  . levothyroxine (SYNTHROID) 88 MCG tablet [Pharmacy Med Name: L-THYROXINE (SYNTHROID) TABS 88MCG] 90 tablet 3    Sig: TAKE 1 TABLET DAILY BEFORE BREAKFAST     Endocrinology:  Hypothyroid Agents Failed - 07/27/2021 11:58 AM      Failed - TSH needs to be rechecked within 3 months after an abnormal result. Refill until TSH is due.      Passed - TSH in normal range and within 360 days    TSH  Date Value Ref Range Status  06/17/2021 2.76 0.40 - 4.50 mIU/L Final         Passed - Valid encounter within last 12 months    Recent Outpatient Visits          1 month ago Annual physical exam   [place], DO   4 months ago Mixed hyperlipidemia   [place], DO   5 months ago Essential hypertension   [place], DO   7 months ago History of cerebrovascular accident (CVA) with residual deficit   [place], DO   1 year ago Pain and swelling of left knee   [place], DO      Future Appointments            In 4 months Brendolyn Patty, MD [place]

## 2021-07-30 ENCOUNTER — Ambulatory Visit: Payer: BC Managed Care – PPO

## 2021-08-04 ENCOUNTER — Ambulatory Visit: Payer: BC Managed Care – PPO

## 2021-08-04 ENCOUNTER — Other Ambulatory Visit: Payer: Self-pay | Admitting: Internal Medicine

## 2021-08-04 ENCOUNTER — Other Ambulatory Visit: Payer: Self-pay

## 2021-08-04 DIAGNOSIS — M6281 Muscle weakness (generalized): Secondary | ICD-10-CM

## 2021-08-04 DIAGNOSIS — M79672 Pain in left foot: Secondary | ICD-10-CM | POA: Diagnosis not present

## 2021-08-04 DIAGNOSIS — E039 Hypothyroidism, unspecified: Secondary | ICD-10-CM

## 2021-08-04 DIAGNOSIS — R262 Difficulty in walking, not elsewhere classified: Secondary | ICD-10-CM

## 2021-08-04 NOTE — Telephone Encounter (Signed)
Requested Prescriptions  Pending Prescriptions Disp Refills  . liothyronine (CYTOMEL) 5 MCG tablet [Pharmacy Med Name: LIOTHYRONINE SODIUM TABS 5MCG] 90 tablet 2    Sig: TAKE 1 TABLET DAILY     Endocrinology:  Hypothyroid Agents Failed - 08/04/2021  7:44 AM      Failed - TSH needs to be rechecked within 3 months after an abnormal result. Refill until TSH is due.      Passed - TSH in normal range and within 360 days    TSH  Date Value Ref Range Status  06/17/2021 2.76 0.40 - 4.50 mIU/L Final         Passed - Valid encounter within last 12 months    Recent Outpatient Visits          1 month ago Annual physical exam   [place], DO   4 months ago Mixed hyperlipidemia   [place], DO   6 months ago Essential hypertension   [place], DO   8 months ago History of cerebrovascular accident (CVA) with residual deficit   [place], DO   1 year ago Pain and swelling of left knee   [place], DO      Future Appointments            In 3 months Brendolyn Patty, MD [place]

## 2021-08-04 NOTE — Therapy (Signed)
[place] MAIN [facility] SERVICES [address] [place], [place], [address] Phone: [phone]   Fax:  [phone]  Physical Therapy Treatment  Patient Details  Name: Sherry [place] MRN: [id number] Date of Birth: [birth year] Referring Provider (PT): Sherry [place]   Encounter Date: 08/04/2021   PT End of Session - 08/04/21 1250     Visit Number 7    Number of Visits 14    Date for PT Re-Evaluation 09/15/21    Authorization Type [employer]BS, 40VL anually PT/OT/Chiro    Progress Note Due on Visit 10    PT Start Time 0930    PT Stop Time 2951    PT Time Calculation (min) 44 min    Activity Tolerance Patient tolerated treatment well    Behavior During Therapy Sherry [place] for tasks assessed/performed             Past Medical History:  Diagnosis Date   Allergy    Gastropathy    GERD (gastroesophageal reflux disease)    Headache    Hyperlipidemia    Hypertension    Hypothyroid    Lumbar radiculopathy    Lung nodule    Sleep apnea     Past Surgical History:  Procedure Laterality Date   APPENDECTOMY  2008   [place]   bladder stem   BREAST BIOPSY Left 06/06/2019   Lymph node US Bx, hydromarker. REACTIVE LYMPH NODE, FAVOR BENIGN   COLONOSCOPY WITH PROPOFOL N/A 02/19/2015   Procedure: COLONOSCOPY WITH PROPOFOL;  Surgeon: Sherry Sails, MD;  Location: [facility] ENDOSCOPY;  Service: Endoscopy;  Laterality: N/A;   COLONOSCOPY WITH PROPOFOL N/A 03/24/2018   Procedure: COLONOSCOPY WITH PROPOFOL;  Surgeon: Sherry Sails, MD;  Location: [facility] ENDOSCOPY;  Service: Endoscopy;  Laterality: N/A;   ELBOW SURGERY Right 1982   ESOPHAGOGASTRODUODENOSCOPY     FOOT SURGERY Left 2007   neuroma removal, repeat 2008    There were no vitals filed for this visit.   Subjective Assessment - 08/04/21 1248     Subjective Patient reports she has stopped the pain medicine for her teeth. Had a sport removed from plantar surface of L foot. Had some pain on  Sunday . Numbness on L dorsal portion of foot.    Pertinent History Sherry Oliver is a 31yoF who sustained a thalamic CVA in March 2022, initially with weakness and incoordination deficits of Rt hemibody. PT evaluation in ED recommended OPPT services. Pt has not had any therapy services since DC from [place], but has been awaiting scheduling for this evlauation. Pt has been working on self-guided fine motor coordination with childrens hand-writing activity [place] that she bought. Pt reports at this date, all appreciable deficits have resolved, pt back at baseline. Pt denies any recent falls, acute imbalance, asymmetrical weakness or changes to sensorium.    Limitations Walking;Standing    How long can you stand comfortably? 2 hours in hard soled shoes vs 30 min in soft soled shoes    How long can you walk comfortably? 2 hours    Patient Stated Goals Improve the pain in the left foot.    Currently in Pain? Yes    Pain Score 2     Pain Location Foot    Pain Orientation Right;Left    Pain Descriptors / Indicators Aching    Pain Type Chronic pain    Pain Onset More than a month ago    Pain Frequency Constant  Patient reports she has stopped the pain medicine for her teeth. Had a sport removed from plantar surface of L foot. Had some pain on Sunday . Numbness on L dorsal portion of foot.    Manual therapy iASTM with metal tool to plantar surface of L foot x 11 minutes   PT performed PROM to toes in flexion/extension in tolerated ROM PT also performed passive stretch to MTP joints with splaying toes. Patient reports mild discomfort but states its not too bad   PT identified hypomobility along distal interphalangeal joint of 2nd toe. PT performed grade II-III joint mobs AP/PA 10 sec bouts x4 reps each with passive flexion/extension; She reports pain in distal foot with passive extension;   Education on toe spacing interventions including use of towel/wash cloth, patient  demonstrated understanding and verbalized relief of some symptoms   Roller stick to calf (added to HEP ) x3 minutes  Trigger Point Dry Needling (TDN), unbilled Education performed with patient regarding potential benefit of TDN. Reviewed precautions and risks with patient. Reviewed special precautions/risks over lung fields which include pneumothorax. Reviewed signs and symptoms of pneumothorax and advised pt to go to ER immediately if these symptoms develop advise them of dry needling treatment. Extensive time spent with pt to ensure full understanding of TDN risks. Pt provided verbal consent to treatment. TDN performed to  with 0.25 x 40 single needle placements with local twitch response (LTR). Pistoning technique utilized. Improved pain-free motion following intervention. Interventions targeted gastroc/soleus, as well as FDL. X 17 minutes         Patient is returning to therapy after two week hiatus. Patient has referral patterning from soleus and FDL to foot that is relieved with TDN. Patient has decreased pain and stiffness reported by end of session. Will attempt again in next week or two to see if physical therapy is assisting in reduction of symptoms. Patient will continue to benefit from skilled physical therapy in order to improve her pain quality of life and overall function with less pain                    PT Education - 08/04/21 1249     Education Details exercise technique, body mechanics, TDN    Person(s) Educated Patient    Methods Explanation;Demonstration;Tactile cues;Verbal cues    Comprehension Verbalized understanding;Returned demonstration;Verbal cues required;Tactile cues required              PT Short Term Goals - 07/21/21 0949       PT SHORT TERM GOAL #1   Title Patient will increase FOTO score by greater than 5 points  to demonstrate statistically significant improvement in mobility and quality of life.    Baseline Will complete at second  appointment : 11/7: 52%    Time 4    Period Weeks    Status Partially Met    Target Date 08/18/21      PT SHORT TERM GOAL #2   Title Patient will be independent in home exercise program to improve strength/mobility for better functional independence with ADLs.    Baseline Pt does not have HEP for her foot 11/7: HEP compliant    Time 3    Period Weeks    Status Partially Met    Target Date 08/18/21               PT Long Term Goals - 07/21/21 0949       PT LONG TERM GOAL #1  Title Patient will increase FOTO score by 10 points in order to demonstrate statistically significant improvement in mobility and quality of life.    Baseline Will complete at second visit 11/7: 52%    Time 8    Period Weeks    Status Partially Met    Target Date 09/15/21      PT LONG TERM GOAL #2   Title Patient will report a worst pain of 3/10 on VAS in    her left foot   to improve tolerance with ADLs and reduced symptoms with activities.    Baseline Pt reports intermittent pain of greater than 3/10 at times 11/7: 8/10    Time 8    Period Weeks    Status On-going    Target Date 09/15/21      PT LONG TERM GOAL #3   Title Patient will be able to stand for >1 hour without pain increase of 3/10 in bilateral feet for increased tolerance for functional mobility and improved quality of life    Baseline 11/7: painful upon standing    Time 8    Period Weeks    Status New    Target Date 09/15/21                   Plan - 08/04/21 1253     Clinical Impression Statement Patient is returning to therapy after two week hiatus. Patient has referral patterning from soleus and FDL to foot that is relieved with TDN. Patient has decreased pain and stiffness reported by end of session. Will attempt again in next week or two to see if physical therapy is assisting in reduction of symptoms. Patient will continue to benefit from skilled physical therapy in order to improve her pain quality of life and  overall function with less pain    Personal Factors and Comorbidities Comorbidity 1;Time since onset of injury/illness/exacerbation    Comorbidities prior CVA (per imaging)    Examination-Activity Limitations Stand;Carry;Locomotion Level;Lift;Stairs;Squat;Transfers    Examination-Participation Restrictions Building surveyor;Yard Work    Stability/Clinical Decision Making Stable/Uncomplicated    Rehab Potential Fair    PT Frequency 1x / week    PT Duration 8 weeks    PT Treatment/Interventions ADLs/Self Care Home Management;Gait training;Orthotic Fit/Training;Passive range of motion;Taping;Joint Manipulations;Balance training;Neuromuscular re-education;Therapeutic exercise;Therapeutic activities;Manual techniques;Dry needling;Iontophoresis 92m/ml Dexamethasone;Cryotherapy;Moist Heat;Traction;Ultrasound;Stair training;Functional mobility training;Splinting;Vestibular;Vasopneumatic Device;Visual/perceptual remediation/compensation;Energy conservation    PT Next Visit Plan Dry needling for posterior tibialis muscle and soleus and gastroc as indicated.    PT Home Exercise Plan Added standing gastroc and soleus stretches.    Consulted and Agree with Plan of Care Patient             Patient will benefit from skilled therapeutic intervention in order to improve the following deficits and impairments:  Abnormal gait, Difficulty walking, Decreased activity tolerance, Decreased range of motion, Decreased mobility, Decreased endurance, Increased fascial restricitons, Hypomobility, Impaired perceived functional ability, Impaired flexibility, Impaired sensation, Improper body mechanics, Pain  Visit Diagnosis: Pain in left foot  Difficulty in walking, not elsewhere classified  Muscle weakness (generalized)     Problem List Patient Active Problem List   Diagnosis Date Noted   Primary insomnia 02/01/2021   History of cerebrovascular accident (CVA) with residual deficit  12/06/2020   Essential hypertension 12/06/2020   Elevated hemoglobin A1c 04/20/2020   Thrombocytosis 04/20/2020   OSA on CPAP 11/17/2019   Former smoker 11/17/2019   Drug-induced constipation 11/17/2019   Obesity (BMI 30.0-34.9) 11/17/2019   Acquired hypothyroidism  11/17/2019   Drug-induced myopathy 11/17/2019   Mixed hyperlipidemia 11/17/2019   Restless leg syndrome 11/17/2019   Centrilobular emphysema ([place]) 11/17/2019   Family history of BRCA2 gene positive 11/17/2019   Janna Arch, PT, DPT  08/04/2021, 12:54 PM  [place] MAIN [facility] SERVICES [address] [place], [place], [address] Phone: [phone]   Fax:  [phone]  Name: Sherry [place] MRN: [id number] Date of Birth: Jun 16, 1961

## 2021-08-20 ENCOUNTER — Ambulatory Visit: Payer: BC Managed Care – PPO | Attending: Neurology

## 2021-08-20 ENCOUNTER — Other Ambulatory Visit: Payer: Self-pay

## 2021-08-20 DIAGNOSIS — M79672 Pain in left foot: Secondary | ICD-10-CM | POA: Insufficient documentation

## 2021-08-20 DIAGNOSIS — R262 Difficulty in walking, not elsewhere classified: Secondary | ICD-10-CM | POA: Insufficient documentation

## 2021-08-20 DIAGNOSIS — M6281 Muscle weakness (generalized): Secondary | ICD-10-CM | POA: Insufficient documentation

## 2021-08-20 NOTE — Therapy (Signed)
[place] MAIN [facility] SERVICES [address] [place], [place], [address] Phone: [phone]   Fax:  [phone]  Physical Therapy Treatment  Patient Details  Name: Sherry Oliver MRN: [id number] Date of Birth: 07/24/1961 Referring Provider (PT): Jennings Books   Encounter Date: 08/20/2021   PT End of Session - 08/20/21 1032     Visit Number 8    Number of Visits 14    Date for PT Re-Evaluation 09/15/21    Authorization Type BCBS, 40VL anually PT/OT/Chiro    Progress Note Due on Visit 10    PT Start Time 0900    PT Stop Time 0946    PT Time Calculation (min) 46 min    Activity Tolerance Patient tolerated treatment well    Behavior During Therapy [facility] for tasks assessed/performed             Past Medical History:  Diagnosis Date   Allergy    Gastropathy    GERD (gastroesophageal reflux disease)    Headache    Hyperlipidemia    Hypertension    Hypothyroid    Lumbar radiculopathy    Lung nodule    Sleep apnea     Past Surgical History:  Procedure Laterality Date   APPENDECTOMY  2008   [facility]   bladder stem   BREAST BIOPSY Left 06/06/2019   Lymph node US Bx, hydromarker. REACTIVE LYMPH NODE, FAVOR BENIGN   COLONOSCOPY WITH PROPOFOL N/A 02/19/2015   Procedure: COLONOSCOPY WITH PROPOFOL;  Surgeon: Lollie Sails, MD;  Location: [facility] ENDOSCOPY;  Service: Endoscopy;  Laterality: N/A;   COLONOSCOPY WITH PROPOFOL N/A 03/24/2018   Procedure: COLONOSCOPY WITH PROPOFOL;  Surgeon: Lollie Sails, MD;  Location: [facility] ENDOSCOPY;  Service: Endoscopy;  Laterality: N/A;   ELBOW SURGERY Right 1982   ESOPHAGOGASTRODUODENOSCOPY     FOOT SURGERY Left 2007   neuroma removal, repeat 2008    There were no vitals filed for this visit.   Subjective Assessment - 08/20/21 1031     Subjective Patient reports she has been packing to move and had a flare up over the weekend. Reports therapy has been helpful though.     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 PT evaluation in ED recommended OPPT services. Pt has not had any therapy services since DC from hospital, but has been awaiting scheduling for this evlauation. Pt has been working on self-guided fine motor coordination with childrens hand-writing activity books that she bought. Pt reports at this date, all appreciable deficits have resolved, pt back at baseline. Pt denies any recent falls, acute imbalance, asymmetrical weakness or changes to sensorium.    Limitations Walking;Standing    How long can you stand comfortably? 2 hours in hard soled shoes vs 30 min in soft soled shoes    How long can you walk comfortably? 2 hours    Patient Stated Goals Improve the pain in the left foot.    Currently in Pain? Yes    Pain Score 3     Pain Location Leg    Pain Orientation Right;Left    Pain Descriptors / Indicators Aching    Pain Type Chronic pain;Neuropathic pain    Pain Onset More than a month ago    Pain Frequency Constant  Treatment:  iASTM with metal tool to plantar surface of L foot x 11 minutes   PT performed PROM to toes in flexion/extension in tolerated ROM   PT identified hypomobility along distal interphalangeal joint of 2nd toe. PT performed grade II-III joint mobs AP/PA 10 sec bouts x4 reps each with passive flexion/extension; She reports pain in distal foot with passive extension;   Grade II AP talocrural mobilizations 10x 15 second holds each LE  SAD with belt in figure four position 5x20 second holds   Figure four stretch: added to HEP 45 seconds   GTB four way ankle strengthening 10x each direction each ankle   Trigger Point Dry Needling (TDN), unbilled Education performed with patient regarding potential benefit of TDN. Reviewed precautions and risks with patient. Reviewed special  precautions/risks over lung fields which include pneumothorax. Reviewed signs and symptoms of pneumothorax and advised pt to go to ER immediately if these symptoms develop advise them of dry needling treatment. Extensive time spent with pt to ensure full understanding of TDN risks. Pt provided verbal consent to treatment. TDN performed to  with 0.25 x 40 single needle placements with local twitch response (LTR). Pistoning technique utilized. Improved pain-free motion following intervention. Interventions targeted gastroc/soleus, as well as FDL. X 14 minutes       Pt educated throughout session about proper posture and technique with exercises. Improved exercise technique, movement at target joints, use of target muscles after min to mod verbal, visual, tactile cues.   Patient tolerated progressive treatment well with minimal pain increase. Use of distraction to LLE decreased radiating pain per patient report. Patient has more scheduled appointments as patient reports therapy has been helping her. Patient has referral patterning from soleus and FDL to foot that is relieved with TDN. Patient will continue to benefit from skilled physical therapy in order to improve her pain quality of life and overall function with less pain                 PT Education - 08/20/21 1031     Education Details exercise, manual, TDN    Person(s) Educated Patient    Methods Explanation;Demonstration;Tactile cues;Verbal cues    Comprehension Verbalized understanding;Returned demonstration;Verbal cues required;Tactile cues required              PT Short Term Goals - 07/21/21 0949       PT SHORT TERM GOAL #1   Title Patient will increase FOTO score by greater than 5 points  to demonstrate statistically significant improvement in mobility and quality of life.    Baseline Will complete at second appointment : 11/7: 52%    Time 4    Period Weeks    Status Partially Met    Target Date 08/18/21       PT SHORT TERM GOAL #2   Title Patient will be independent in home exercise program to improve strength/mobility for better functional independence with ADLs.    Baseline Pt does not have HEP for her foot 11/7: HEP compliant    Time 3    Period Weeks    Status Partially Met    Target Date 08/18/21               PT Long Term Goals - 07/21/21 0949       PT LONG TERM GOAL #1   Title Patient will increase FOTO score by 10 points in order to demonstrate statistically significant improvement in mobility and quality of life.  Baseline Will complete at second visit 11/7: 52%    Time 8    Period Weeks    Status Partially Met    Target Date 09/15/21      PT LONG TERM GOAL #2   Title Patient will report a worst pain of 3/10 on VAS in    her left foot   to improve tolerance with ADLs and reduced symptoms with activities.    Baseline Pt reports intermittent pain of greater than 3/10 at times 11/7: 8/10    Time 8    Period Weeks    Status On-going    Target Date 09/15/21      PT LONG TERM GOAL #3   Title Patient will be able to stand for >1 hour without pain increase of 3/10 in bilateral feet for increased tolerance for functional mobility and improved quality of life    Baseline 11/7: painful upon standing    Time 8    Period Weeks    Status New    Target Date 09/15/21                   Plan - 08/20/21 1033     Clinical Impression Statement Patient tolerated progressive treatment well with minimal pain increase. Use of distraction to LLE decreased radiating pain per patient report. Patient has more scheduled appointments as patient reports therapy has been helping her. Patient has referral patterning from soleus and FDL to foot that is relieved with TDN. Patient will continue to benefit from skilled physical therapy in order to improve her pain quality of life and overall function with less pain    Personal Factors and Comorbidities Comorbidity 1;Time since onset of  injury/illness/exacerbation    Comorbidities prior CVA (per imaging)    Examination-Activity Limitations Stand;Carry;Locomotion Level;Lift;Stairs;Squat;Transfers    Examination-Participation Restrictions Building surveyor;Yard Work    Stability/Clinical Decision Making Stable/Uncomplicated    Rehab Potential Fair    PT Frequency 1x / week    PT Duration 8 weeks    PT Treatment/Interventions ADLs/Self Care Home Management;Gait training;Orthotic Fit/Training;Passive range of motion;Taping;Joint Manipulations;Balance training;Neuromuscular re-education;Therapeutic exercise;Therapeutic activities;Manual techniques;Dry needling;Iontophoresis 29m/ml Dexamethasone;Cryotherapy;Moist Heat;Traction;Ultrasound;Stair training;Functional mobility training;Splinting;Vestibular;Vasopneumatic Device;Visual/perceptual remediation/compensation;Energy conservation    PT Next Visit Plan Dry needling for posterior tibialis muscle and soleus and gastroc as indicated.    PT Home Exercise Plan Added standing gastroc and soleus stretches.    Consulted and Agree with Plan of Care Patient             Patient will benefit from skilled therapeutic intervention in order to improve the following deficits and impairments:  Abnormal gait, Difficulty walking, Decreased activity tolerance, Decreased range of motion, Decreased mobility, Decreased endurance, Increased fascial restricitons, Hypomobility, Impaired perceived functional ability, Impaired flexibility, Impaired sensation, Improper body mechanics, Pain  Visit Diagnosis: Pain in left foot  Difficulty in walking, not elsewhere classified  Muscle weakness (generalized)     Problem List Patient Active Problem List   Diagnosis Date Noted   Primary insomnia 02/01/2021   History of cerebrovascular accident (CVA) with residual deficit 12/06/2020   Essential hypertension 12/06/2020   Elevated hemoglobin A1c 04/20/2020   Thrombocytosis  04/20/2020   OSA on CPAP 11/17/2019   Former smoker 11/17/2019   Drug-induced constipation 11/17/2019   Obesity (BMI 30.0-34.9) 11/17/2019   Acquired hypothyroidism 11/17/2019   Drug-induced myopathy 11/17/2019   Mixed hyperlipidemia 11/17/2019   Restless leg syndrome 11/17/2019   Centrilobular emphysema (HVolcano 11/17/2019   Family history of BRCA2 gene positive  11/17/2019    Janna Arch, PT, DPT  08/20/2021, 10:34 AM  [place] MAIN [facility] SERVICES [address] [place], [place], [address] Phone: [phone]   Fax:  [phone]  Name: Sherry Oliver MRN: [id number] Date of Birth: December 27, 1960

## 2021-08-27 ENCOUNTER — Ambulatory Visit: Payer: BC Managed Care – PPO

## 2021-09-07 DIAGNOSIS — G4733 Obstructive sleep apnea (adult) (pediatric): Secondary | ICD-10-CM | POA: Diagnosis not present

## 2021-09-09 DIAGNOSIS — Q828 Other specified congenital malformations of skin: Secondary | ICD-10-CM | POA: Diagnosis not present

## 2021-09-09 DIAGNOSIS — M2012 Hallux valgus (acquired), left foot: Secondary | ICD-10-CM | POA: Diagnosis not present

## 2021-09-09 DIAGNOSIS — D2372 Other benign neoplasm of skin of left lower limb, including hip: Secondary | ICD-10-CM | POA: Diagnosis not present

## 2021-09-09 DIAGNOSIS — M19071 Primary osteoarthritis, right ankle and foot: Secondary | ICD-10-CM | POA: Diagnosis not present

## 2021-09-30 DIAGNOSIS — M216X2 Other acquired deformities of left foot: Secondary | ICD-10-CM | POA: Diagnosis not present

## 2021-09-30 DIAGNOSIS — D2372 Other benign neoplasm of skin of left lower limb, including hip: Secondary | ICD-10-CM | POA: Diagnosis not present

## 2021-09-30 DIAGNOSIS — M19071 Primary osteoarthritis, right ankle and foot: Secondary | ICD-10-CM | POA: Diagnosis not present

## 2021-09-30 DIAGNOSIS — Q828 Other specified congenital malformations of skin: Secondary | ICD-10-CM | POA: Diagnosis not present

## 2021-09-30 DIAGNOSIS — M2012 Hallux valgus (acquired), left foot: Secondary | ICD-10-CM | POA: Diagnosis not present

## 2021-10-16 DIAGNOSIS — G4733 Obstructive sleep apnea (adult) (pediatric): Secondary | ICD-10-CM | POA: Diagnosis not present

## 2021-10-16 DIAGNOSIS — I10 Essential (primary) hypertension: Secondary | ICD-10-CM | POA: Diagnosis not present

## 2021-10-16 DIAGNOSIS — J432 Centrilobular emphysema: Secondary | ICD-10-CM | POA: Diagnosis not present

## 2021-10-16 DIAGNOSIS — Z8669 Personal history of other diseases of the nervous system and sense organs: Secondary | ICD-10-CM | POA: Diagnosis not present

## 2021-10-17 ENCOUNTER — Telehealth: Payer: Self-pay

## 2021-10-17 DIAGNOSIS — L2089 Other atopic dermatitis: Secondary | ICD-10-CM

## 2021-10-17 MED ORDER — DUPIXENT 300 MG/2ML ~~LOC~~ SOAJ: 1.0000 | SUBCUTANEOUS | 0 refills | Status: DC

## 2021-10-17 NOTE — Telephone Encounter (Signed)
Prescription refill request received from [facility].

## 2021-10-28 ENCOUNTER — Other Ambulatory Visit: Payer: Self-pay

## 2021-10-28 ENCOUNTER — Ambulatory Visit
Admission: RE | Admit: 2021-10-28 | Discharge: 2021-10-28 | Disposition: A | Discharge status: Home / Self Care | Payer: BC Managed Care – PPO | Source: Ambulatory Visit | Attending: Cardiovascular Disease | Admitting: Cardiovascular Disease

## 2021-10-28 DIAGNOSIS — E7800 Pure hypercholesterolemia, unspecified: Secondary | ICD-10-CM | POA: Insufficient documentation

## 2021-10-28 IMAGING — CT CT CARDIAC CORONARY ARTERY CALCIUM SCORE
1 of 2 series · 11 of 20 positions shown, 14 images · non-contrast
Comparison: CT of the chest on [DATE] and [DATE]
COMPARISON: CT of the chest on [DATE] and [DATE]

Addendum:
EXAM:
OVER-READ INTERPRETATION  CT CHEST

The following report is an over-read performed by radiologist Dr.
RIZZI [REDACTED] on [DATE]. This
over-read does not include interpretation of cardiac or coronary
anatomy or pathology. The coronary calcium score interpretation by
the cardiologist is attached.
CLINICAL DATA: Risk stratification
Coronary Calcium Score
TECHNIQUE: The patient was scanned on a Siemens Somatom go.Top Scanner. Axial
non-contrast 3 mm slices were carried out through the heart. The
data set was analyzed on a dedicated work station and scored using
the Agatson method.

[Series 2: casc 3.0 i36f bestdiast 70 % · axial · 0.39mm/px · z∈[+1220,+1324]mm · 11 of 84 slices shown, 14 images]
[im 7/84  vessel]
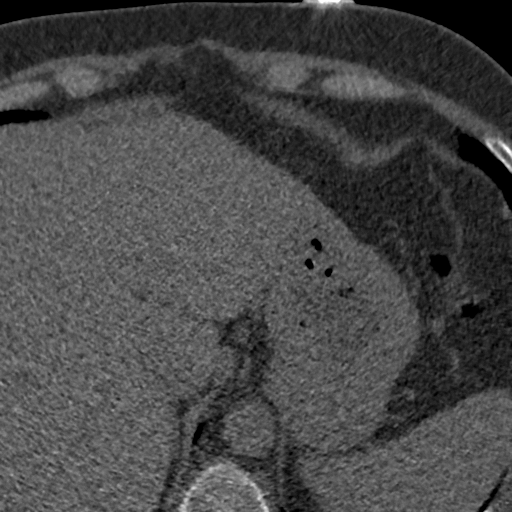
[im 7/84  lung]
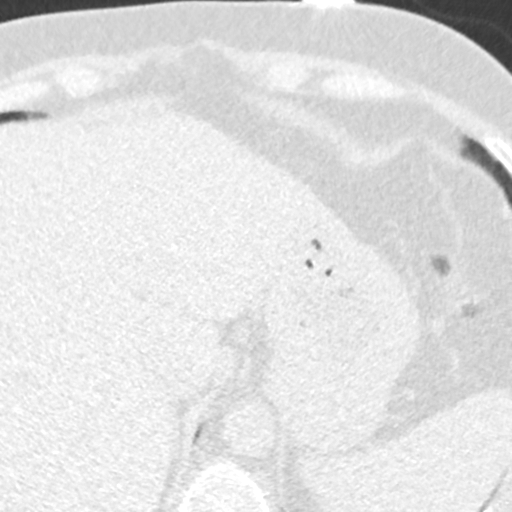
[im 14/84  vessel]
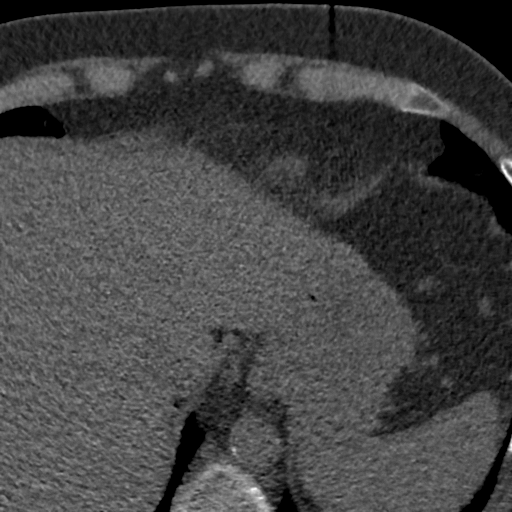
[im 21/84  vessel]
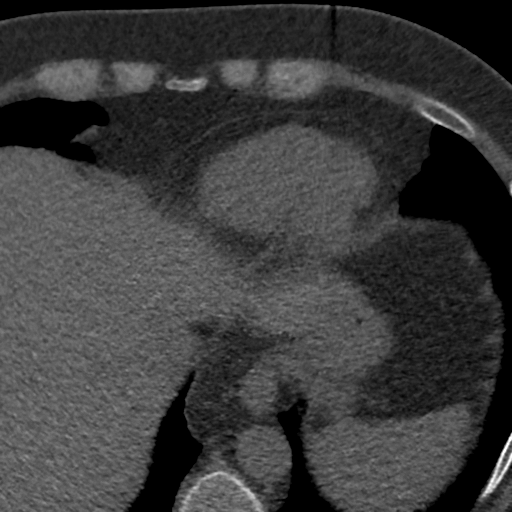
[im 28/84  vessel]
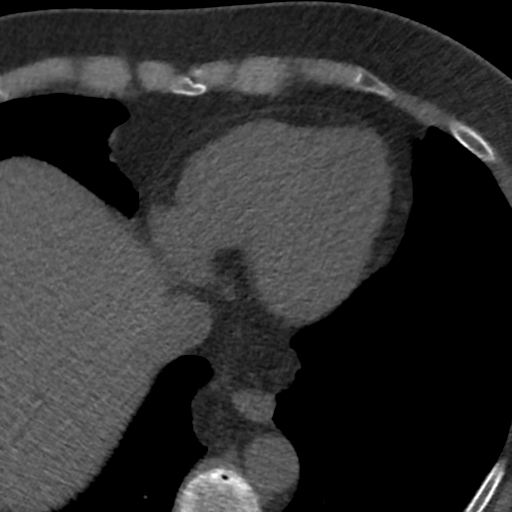
[im 35/84  vessel]
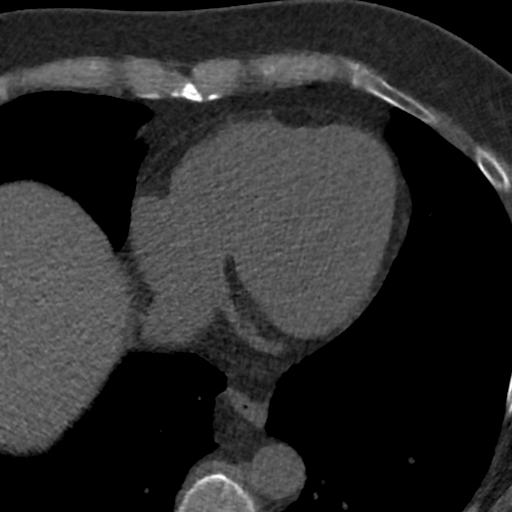
[im 35/84  lung]
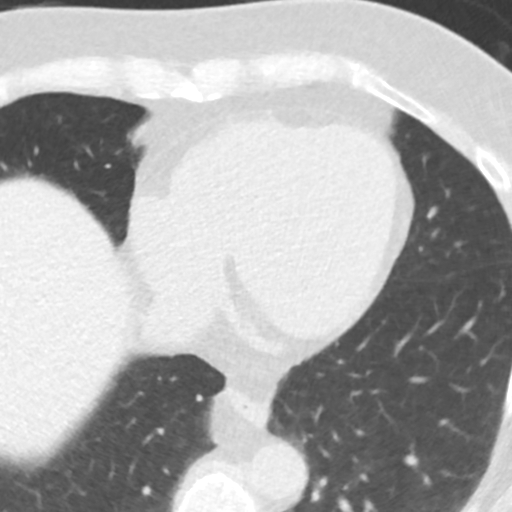
[im 42/84  vessel]
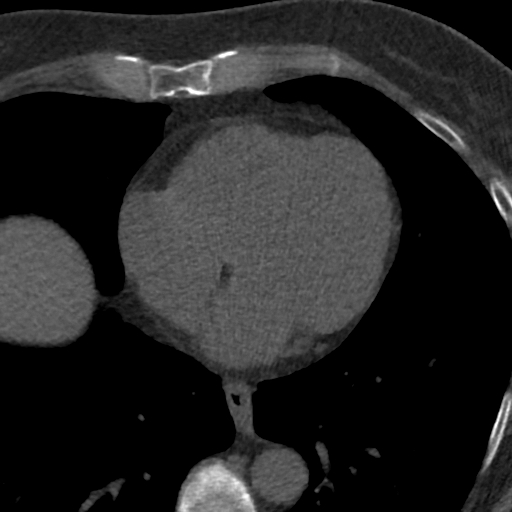
[im 49/84  vessel]
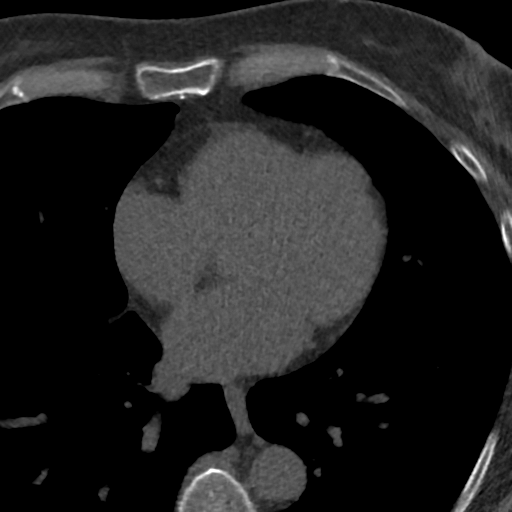
[im 56/84  vessel]
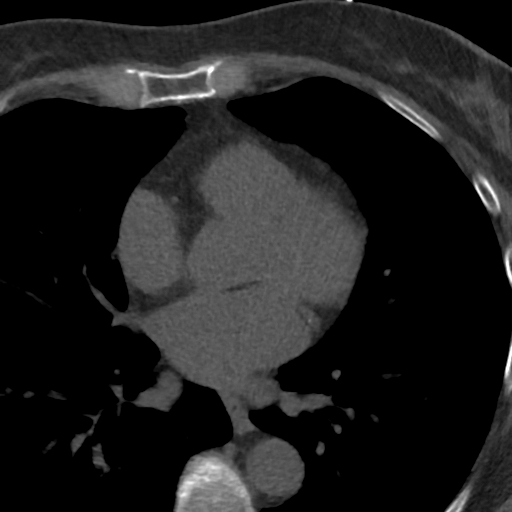
[im 63/84  vessel]
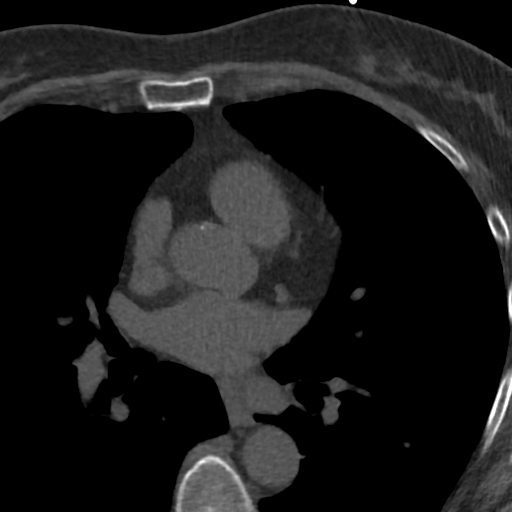
[im 63/84  lung]
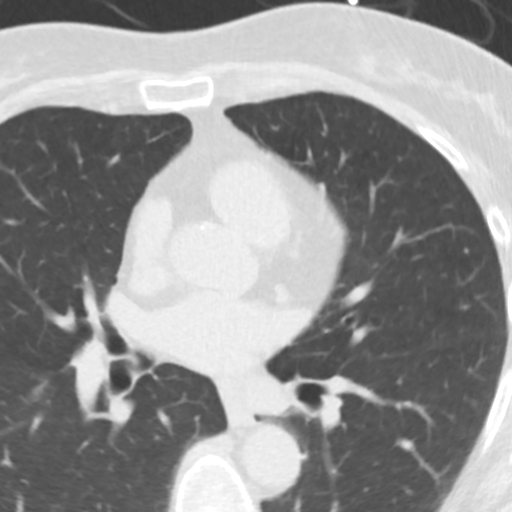
[im 70/84  vessel]
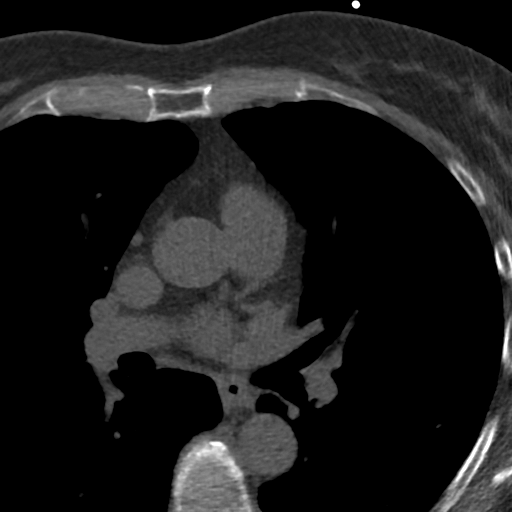
[im 77/84  vessel]
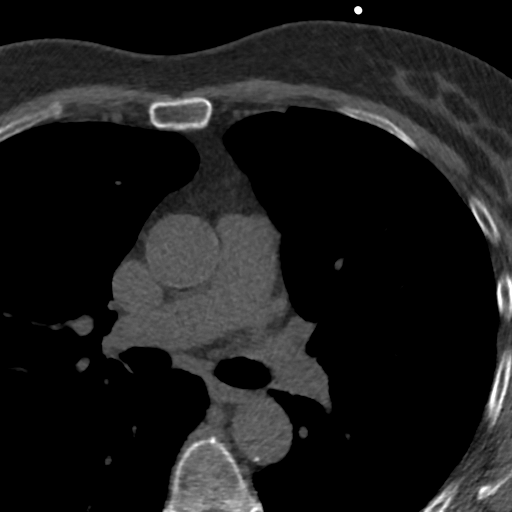

[11 of 20 positions shown; findings below may reference images not displayed]

FINDINGS: Vascular: No significant noncardiac vascular findings.

Mediastinum/Nodes: Visualized mediastinum and hilar regions
demonstrate no lymphadenopathy or masses.

Lungs/Pleura: Stable 5 mm fissural nodule associated with the minor
fissure of the right lung most likely representing an intrapulmonary
lymph node. Stable 4 mm anterior subpleural nodule in the inferior
right middle lobe. Visualized lungs show no evidence of pulmonary
edema, consolidation, pneumothorax or pleural fluid.

Upper Abdomen: No acute abnormality.

Musculoskeletal: No chest wall mass or suspicious bone lesions
identified.
IMPRESSION: Stable 4 mm subpleural right middle lobe nodule and 5 mm fissural
nodule of the minor fissure of the right lung. These are stable
since [JK] and therefore benign.
FINDINGS: Non-cardiac: See separate report from [REDACTED].

Ascending Aorta: Normal size

Pericardium: Normal

Coronary arteries: Normal origin of left and right coronary
arteries. Distribution of arterial calcifications if present, as
noted below;

LM 0

LAD 0

LCx

RCA 0

Total

IMPRESSION AND RECOMMENDATION:
1. Low/minimal coronary calcium score of 0.45. This was 59th
percentile for age and sex matched control.

2.  Patient is low risk for coronary events.

3.  Continue heart healthy lifestyle and risk factor modification.

RIZZI

*** End of Addendum ***
EXAM:
OVER-READ INTERPRETATION  CT CHEST

The following report is an over-read performed by radiologist Dr.
RIZZI [REDACTED] on [DATE]. This
over-read does not include interpretation of cardiac or coronary
anatomy or pathology. The coronary calcium score interpretation by
the cardiologist is attached.
FINDINGS: Vascular: No significant noncardiac vascular findings.

Mediastinum/Nodes: Visualized mediastinum and hilar regions
demonstrate no lymphadenopathy or masses.

Lungs/Pleura: Stable 5 mm fissural nodule associated with the minor
fissure of the right lung most likely representing an intrapulmonary
lymph node. Stable 4 mm anterior subpleural nodule in the inferior
right middle lobe. Visualized lungs show no evidence of pulmonary
edema, consolidation, pneumothorax or pleural fluid.

Upper Abdomen: No acute abnormality.

Musculoskeletal: No chest wall mass or suspicious bone lesions
identified.
IMPRESSION: Stable 4 mm subpleural right middle lobe nodule and 5 mm fissural
nodule of the minor fissure of the right lung. These are stable
since [JK] and therefore benign.

## 2021-11-14 ENCOUNTER — Other Ambulatory Visit: Payer: Self-pay | Admitting: Dermatology

## 2021-11-14 DIAGNOSIS — L2089 Other atopic dermatitis: Secondary | ICD-10-CM

## 2021-11-25 ENCOUNTER — Ambulatory Visit: Payer: BC Managed Care – PPO | Admitting: Dermatology

## 2021-11-25 ENCOUNTER — Other Ambulatory Visit: Payer: Self-pay

## 2021-11-25 DIAGNOSIS — Z79899 Other long term (current) drug therapy: Secondary | ICD-10-CM | POA: Diagnosis not present

## 2021-11-25 DIAGNOSIS — L2089 Other atopic dermatitis: Secondary | ICD-10-CM | POA: Diagnosis not present

## 2021-11-25 DIAGNOSIS — L84 Corns and callosities: Secondary | ICD-10-CM | POA: Diagnosis not present

## 2021-11-25 NOTE — Patient Instructions (Addendum)
Curad Mediplast - Apply to growth on left foot to help soften, changing as needed.  ? ? ?Dupilumab (Dupixent) is a treatment given by injection for adults and children with moderate-to-severe atopic dermatitis. Goal is control of skin condition, not cure. It is given as 2 injections at the first dose followed by 1 injection ever 2 weeks thereafter.  Young children are dosed monthly. ? ?Potential side effects include allergic reaction, herpes infections, injection site reactions and conjunctivitis (inflammation of the eyes).  The use of Dupixent requires long term medication management, including periodic office visits. ? ?Topical steroids (such as triamcinolone, fluocinolone, fluocinonide, mometasone, clobetasol, halobetasol, betamethasone, hydrocortisone) can cause thinning and lightening of the skin if they are used for too long in the same area. Your physician has selected the right strength medicine for your problem and area affected on the body. Please use your medication only as directed by your physician to prevent side effects.  ? ?If You Need Anything After Your Visit ? ?If you have any questions or concerns for your doctor, please call our main line at [phone] and press option 4 to reach your doctor's medical assistant. If no one answers, please leave a voicemail as directed and we will return your call as soon as possible. Messages left after 4 pm will be answered the following business day.  ? ?You may also send [place] a message via MyChart. We typically respond to MyChart messages within 1-2 business days. ? ?For prescription refills, please ask your pharmacy to contact our office. Our fax number is [phone]. ? ?If you have an urgent issue when the clinic is closed that cannot wait until the next business day, you can page your doctor at the number below.   ? ?Please note that while we do our best to be available for urgent issues outside of office hours, we are not available 24/7.  ? ?If you have  an urgent issue and are unable to reach [place], you may choose to seek medical care at your doctor's office, retail clinic, urgent care center, or emergency room. ? ?If you have a medical emergency, please immediately call 911 or go to the emergency department. ? ?Pager Numbers ? ?- Dr. Nehemiah Massed: [phone] ? ?- Dr. Laurence Ferrari: [phone] ? ?- Dr. Nicole Kindred: [phone] ? ?In the event of inclement weather, please call our main line at [phone] for an update on the status of any delays or closures. ? ?Dermatology Medication Tips: ?Please keep the boxes that topical medications come in in order to help keep track of the instructions about where and how to use these. Pharmacies typically print the medication instructions only on the boxes and not directly on the medication tubes.  ? ?If your medication is too expensive, please contact our office at [phone] option 4 or send [place] a message through [place].  ? ?We are unable to tell what your co-pay for medications will be in advance as this is different depending on your insurance coverage. However, we may be able to find a substitute medication at lower cost or fill out paperwork to get insurance to cover a needed medication.  ? ?If a prior authorization is required to get your medication covered by your insurance company, please allow [place] 1-2 business days to complete this process. ? ?Drug prices often vary depending on where the prescription is filled and some pharmacies may offer cheaper prices. ? ?The website www.goodrx.com contains coupons for medications through different pharmacies. The prices here do not account for  what the cost may be with help from insurance (it may be cheaper with your insurance), but the website can give you the price if you did not use any insurance.  ?- You can print the associated coupon and take it with your prescription to the pharmacy.  ?- You may also stop by our office during regular business hours and pick up a GoodRx coupon card.   ?- If you need your prescription sent electronically to a different pharmacy, notify our office through [facility] or by phone at [phone] option 4. ? ? ? ? ?Si Usted Necesita Algo Despu?s de Su Visita ? ?Tambi?n puede enviarnos un mensaje a trav?s de MyChart. Por lo general respondemos a los mensajes de MyChart en el transcurso de 1 a 2 d?as h?biles. ? ?Para renovar recetas, por favor pida a su farmacia que se ponga en contacto con nuestra oficina. Nuestro n?mero de fax es el [phone]. ? ?Si tiene un asunto urgente cuando la cl?nica est? cerrada y que no puede esperar hasta el siguiente d?a h?bil, puede llamar/localizar a su doctor(a) al n?mero que aparece a continuaci?n.  ? ?Por favor, tenga en cuenta que aunque hacemos todo lo posible para estar disponibles para asuntos urgentes fuera del horario de oficina, no estamos disponibles las 24 horas del d?a, los 7 d?as de la semana.  ? ?Si tiene un problema urgente y no puede comunicarse con nosotros, puede optar por buscar atenci?n m?dica  en el consultorio de su doctor(a), en una cl?nica privada, en un centro de atenci?n urgente o en una sala de emergencias. ? ?Si tiene Engineer, maintenance (IT) m?dica, por favor llame inmediatamente al 911 o vaya a la sala de emergencias. ? ?N?meros de b?per ? ?- Dr. Nehemiah Massed: [phone] ? ?- Dra. Moye: [phone] ? ?- Dra. Nicole Kindred: [phone] ? ?En caso de inclemencias del tiempo, por favor llame a nuestra l?nea principal al [phone] para una actualizaci?n sobre el estado de cualquier retraso o cierre. ? ?Consejos para la medicaci?n en dermatolog?a: ?Por favor, guarde las cajas en las que vienen los medicamentos de uso t?pico para ayudarle a seguir las instrucciones sobre d?nde y c?mo usarlos. Las farmacias generalmente imprimen las instrucciones del medicamento s?lo en las cajas y no directamente en los tubos del [place].  ? ?Si su medicamento es muy caro, por favor, p?ngase en contacto con Zigmund Daniel  llamando al [phone] y presione la opci?n 4 o env?enos un mensaje a trav?s de MyChart.  ? ?No podemos decirle cu?l ser? su copago por los medicamentos por adelantado ya que esto es diferente dependiendo de la cobertura de su seguro. Sin embargo, es posible que podamos encontrar un medicamento sustituto a Electrical engineer un formulario para que el seguro cubra el medicamento que se considera necesario.  ? ?Si se requiere Ardelia Mems autorizaci?n previa para que su compa??a de seguros [place] su medicamento, por favor perm?tanos de 1 a 2 d?as h?biles para completar este proceso. ? ?Los precios de los medicamentos var?an con frecuencia dependiendo del Environmental consultant de d?nde se surte la receta y alguna farmacias pueden ofrecer precios m?s baratos. ? ?El sitio web www.goodrx.com tiene cupones para medicamentos de Airline pilot. Los precios aqu? no tienen en cuenta lo que podr?a costar con la ayuda del seguro (puede ser m?s barato con su seguro), pero el sitio web puede darle el precio si no utiliz? ning?n seguro.  ?- Puede imprimir el cup?n correspondiente y llevarlo con su receta a la farmacia.  ?- Tambi?n puede  pasar por nuestra oficina durante el horario de atenci?n regular y recoger una tarjeta de cupones de GoodRx.  ?- Si necesita que su receta se env?e electr?nicamente a Chiropodist, informe a nuestra oficina a trav?s de MyChart de [place] o por tel?fono llamando al [phone] y presione la opci?n 4. ? ?

## 2021-11-25 NOTE — Progress Notes (Signed)
? ?Follow-Up Visit ?  ?Subjective  ?Sherry Oliver is a 61 y.o. female who presents for the following: Dermatitis (Arms, palms, soles. Much improved with Dupixent injections q 2 wks. No side effects and no eye irritation. She also uses desoximetasone cream. Tacrolimus ointment made worse, so she isn't using it much. Areas on right forearm are not clearing with meds, itchy, never go away. Fingernails have improved, as well. Patient moved into a new house a couple of weeks ago. She has been handling boxes and wrapping paper to unpack. ).  ?Patient also has a painful growth on her left lateral foot to check. She has been using corn pads. No new shoes.  ? ? ?The following portions of the chart were reviewed this encounter and updated as appropriate:  ?  ?  ? ?Review of Systems:  No other skin or systemic complaints except as noted in HPI or Assessment and Plan. ? ?Objective  ?Well appearing patient in no apparent distress; mood and affect are within normal limits. ? ?A focused examination was performed including hands, arm, feet. Relevant physical exam findings are noted in the Assessment and Plan. ? ?arms, palms, soles ?Mild erythema with hyperlinear markings of the palms; mild erythema and scale of the fingertips; mild erythema with xerosis of the soles; pink scaly patches on the right forearm ? ? ? ? ?L lat foot ?Hard Keratotic papule, 47m ? ? ? ?Assessment & Plan  ?Other atopic dermatitis ?arms, palms, soles ? ?Much improved on Dupixent injections. Not at goal, patches on arms never resolve ? ?Atopic dermatitis - Severe, on Dupixent (biologic medication).  ?Atopic dermatitis (eczema) is a chronic, relapsing, pruritic condition that can significantly affect quality of life. It is often associated with allergic rhinitis and/or asthma and can require treatment with topical medications, phototherapy, or in severe cases a biologic medication called Dupixent, which requires long term medication management.   ? ?Continue Dupixent injections q 2 weeks. ?Continue desoximetasone 0.25% cream qd/bid AAs prn flares. ?Samples of Zoryve Cream given to patient to apply to resistant patches on arm. If helps, she will call for Rx (Aleatha Borer. (No propylene glycol) ?Patient requested refill of clobetasol ointment, but patient has allergy to propylene glycol, so she should use the generic Topicort cream instead. ? ?Discussed biopsy to area on right forearm if not improving with topicals.  ? ?Dupilumab (Dupixent) is a treatment given by injection for adults and children with moderate-to-severe atopic dermatitis. Goal is control of skin condition, not cure. It is given as 2 injections at the first dose followed by 1 injection ever 2 weeks thereafter.  Young children are dosed monthly. ? ?Potential side effects include allergic reaction, herpes infections, injection site reactions and conjunctivitis (inflammation of the eyes).  The use of Dupixent requires long term medication management, including periodic office visits. ? ?Topical steroids (such as triamcinolone, fluocinolone, fluocinonide, mometasone, clobetasol, halobetasol, betamethasone, hydrocortisone) can cause thinning and lightening of the skin if they are used for too long in the same area. Your physician has selected the right strength medicine for your problem and area affected on the body. Please use your medication only as directed by your physician to prevent side effects.  ? ? ?Related Medications ?DUPIXENT 300 MG/2ML SOPN ?INJECT 300 MG UNDER THE SKIN EVERY 14 DAYS ? ?Corns and callosities ?L lat foot ? ?Vs Wart. ? ?Start Mediplast patches to soften, changing every few days. If not improving, may treat with cryotherapy.  ? ?Continue to cushion when wearing  shoes to prevent rubbing. ?Make sure shoes fit well and aren't too tight. ? ? ?Return in about 3 months (around 02/25/2022) for Atopic Dermatitis, Dupixent. ? ?I, Jamesetta Orleans, CMA, am acting as scribe for Brendolyn Patty, MD . ? ?Documentation: I have reviewed the above documentation for accuracy and completeness, and I agree with the above. ? ?Brendolyn Patty MD  ? ? ? ?

## 2021-12-08 ENCOUNTER — Other Ambulatory Visit: Payer: Self-pay | Admitting: Dermatology

## 2021-12-08 DIAGNOSIS — L2089 Other atopic dermatitis: Secondary | ICD-10-CM

## 2022-01-05 ENCOUNTER — Other Ambulatory Visit: Payer: Self-pay | Admitting: Dermatology

## 2022-01-05 DIAGNOSIS — L2089 Other atopic dermatitis: Secondary | ICD-10-CM

## 2022-02-02 DIAGNOSIS — D2372 Other benign neoplasm of skin of left lower limb, including hip: Secondary | ICD-10-CM | POA: Diagnosis not present

## 2022-02-02 DIAGNOSIS — D2371 Other benign neoplasm of skin of right lower limb, including hip: Secondary | ICD-10-CM | POA: Diagnosis not present

## 2022-02-02 DIAGNOSIS — M79671 Pain in right foot: Secondary | ICD-10-CM | POA: Diagnosis not present

## 2022-02-02 DIAGNOSIS — Q828 Other specified congenital malformations of skin: Secondary | ICD-10-CM | POA: Diagnosis not present

## 2022-02-03 ENCOUNTER — Other Ambulatory Visit: Payer: Self-pay | Admitting: Family Medicine

## 2022-02-03 DIAGNOSIS — E039 Hypothyroidism, unspecified: Secondary | ICD-10-CM

## 2022-02-04 NOTE — Telephone Encounter (Signed)
Requested Prescriptions  Pending Prescriptions Disp Refills  . liothyronine (CYTOMEL) 5 MCG tablet [Pharmacy Med Name: LIOTHYRONINE SODIUM TABS 5MCG] 90 tablet 1    Sig: TAKE 1 TABLET DAILY     Endocrinology:  Hypothyroid Agents Passed - 02/03/2022  1:26 PM      Passed - TSH in normal range and within 360 days    TSH  Date Value Ref Range Status  06/17/2021 2.76 0.40 - 4.50 mIU/L Final         Passed - Valid encounter within last 12 months    Recent Outpatient Visits          7 months ago Annual physical exam   [name], DO   10 months ago Mixed hyperlipidemia   [name], DO   1 year ago Essential hypertension   [name], Devonne Doughty, DO   1 year ago History of cerebrovascular accident (CVA) with residual deficit   [name], DO   1 year ago Pain and swelling of left knee   [name], DO      Future Appointments            In 3 weeks Brendolyn Patty, MD [place]

## 2022-02-19 DIAGNOSIS — Z9989 Dependence on other enabling machines and devices: Secondary | ICD-10-CM | POA: Diagnosis not present

## 2022-02-19 DIAGNOSIS — G4733 Obstructive sleep apnea (adult) (pediatric): Secondary | ICD-10-CM | POA: Diagnosis not present

## 2022-02-19 DIAGNOSIS — R911 Solitary pulmonary nodule: Secondary | ICD-10-CM | POA: Diagnosis not present

## 2022-02-23 DIAGNOSIS — M2012 Hallux valgus (acquired), left foot: Secondary | ICD-10-CM | POA: Diagnosis not present

## 2022-02-23 DIAGNOSIS — M216X2 Other acquired deformities of left foot: Secondary | ICD-10-CM | POA: Diagnosis not present

## 2022-02-23 DIAGNOSIS — Q828 Other specified congenital malformations of skin: Secondary | ICD-10-CM | POA: Diagnosis not present

## 2022-02-23 DIAGNOSIS — M79672 Pain in left foot: Secondary | ICD-10-CM | POA: Diagnosis not present

## 2022-02-23 DIAGNOSIS — M79671 Pain in right foot: Secondary | ICD-10-CM | POA: Diagnosis not present

## 2022-02-23 DIAGNOSIS — M19071 Primary osteoarthritis, right ankle and foot: Secondary | ICD-10-CM | POA: Diagnosis not present

## 2022-02-23 DIAGNOSIS — D2371 Other benign neoplasm of skin of right lower limb, including hip: Secondary | ICD-10-CM | POA: Diagnosis not present

## 2022-02-23 DIAGNOSIS — D2372 Other benign neoplasm of skin of left lower limb, including hip: Secondary | ICD-10-CM | POA: Diagnosis not present

## 2022-02-25 ENCOUNTER — Telehealth (INDEPENDENT_AMBULATORY_CARE_PROVIDER_SITE_OTHER): Payer: Self-pay | Admitting: Nurse Practitioner

## 2022-02-25 NOTE — Telephone Encounter (Signed)
LVM for patient TR[facility] to schedule appointment on 6.14.23

## 2022-03-02 ENCOUNTER — Ambulatory Visit (INDEPENDENT_AMBULATORY_CARE_PROVIDER_SITE_OTHER): Payer: BC Managed Care – PPO

## 2022-03-02 ENCOUNTER — Other Ambulatory Visit (INDEPENDENT_AMBULATORY_CARE_PROVIDER_SITE_OTHER): Payer: Self-pay | Admitting: Podiatry

## 2022-03-02 DIAGNOSIS — I739 Peripheral vascular disease, unspecified: Secondary | ICD-10-CM

## 2022-03-03 ENCOUNTER — Ambulatory Visit: Payer: BC Managed Care – PPO | Admitting: Dermatology

## 2022-03-03 DIAGNOSIS — L2089 Other atopic dermatitis: Secondary | ICD-10-CM | POA: Diagnosis not present

## 2022-03-03 DIAGNOSIS — Z79899 Other long term (current) drug therapy: Secondary | ICD-10-CM | POA: Diagnosis not present

## 2022-03-03 MED ORDER — TACROLIMUS 0.1 % EX OINT: TOPICAL_OINTMENT | Freq: Every day | CUTANEOUS | 5 refills | Status: DC

## 2022-03-03 MED ORDER — DUPIXENT 300 MG/2ML ~~LOC~~ SOAJ: SUBCUTANEOUS | 5 refills | Status: DC

## 2022-03-03 NOTE — Patient Instructions (Signed)
Due to recent changes in healthcare laws, you may see results of your pathology and/or laboratory studies on MyChart before the doctors have had a chance to review them. We understand that in some cases there may be results that are confusing or concerning to you. Please understand that not all results are received at the same time and often the doctors may need to interpret multiple results in order to provide you with the best plan of care or course of treatment. Therefore, we ask that you please give us 2 business days to thoroughly review all your results before contacting the office for clarification. Should we see a critical lab result, you will be contacted sooner.   If You Need Anything After Your Visit  If you have any questions or concerns for your doctor, please call our main line at [phone] and press option 4 to reach your doctor's medical assistant. If no one answers, please leave a voicemail as directed and we will return your call as soon as possible. Messages left after 4 pm will be answered the following business day.   You may also send us a message via MyChart. We typically respond to MyChart messages within 1-2 business days.  For prescription refills, please ask your pharmacy to contact our office. Our fax number is [phone].  If you have an urgent issue when the clinic is closed that cannot wait until the next business day, you can page your doctor at the number below.    Please note that while we do our best to be available for urgent issues outside of office hours, we are not available 24/7.   If you have an urgent issue and are unable to reach us, you may choose to seek medical care at your doctor's office, retail clinic, urgent care center, or emergency room.  If you have a medical emergency, please immediately call 911 or go to the emergency department.  Pager Numbers  - Dr. Kowalski: [phone]  - Dr. Moye: [phone]  - Dr. Stewart:  [phone]  In the event of inclement weather, please call our main line at [phone] for an update on the status of any delays or closures.  Dermatology Medication Tips: Please keep the boxes that topical medications come in in order to help keep track of the instructions about where and how to use these. Pharmacies typically print the medication instructions only on the boxes and not directly on the medication tubes.   If your medication is too expensive, please contact our office at [phone] option 4 or send us a message through MyChart.   We are unable to tell what your co-pay for medications will be in advance as this is different depending on your insurance coverage. However, we may be able to find a substitute medication at lower cost or fill out paperwork to get insurance to cover a needed medication.   If a prior authorization is required to get your medication covered by your insurance company, please allow us 1-2 business days to complete this process.  Drug prices often vary depending on where the prescription is filled and some pharmacies may offer cheaper prices.  The website www.goodrx.com contains coupons for medications through different pharmacies. The prices here do not account for what the cost may be with help from insurance (it may be cheaper with your insurance), but the website can give you the price if you did not use any insurance.  - You can print the associated coupon and take it with   your prescription to the pharmacy.  - You may also stop by our office during regular business hours and pick up a GoodRx coupon card.  - If you need your prescription sent electronically to a different pharmacy, notify our office through [facility] MyChart or by phone at [phone] option 4.     Si Usted Necesita Algo Despus de Su Visita  Tambin puede enviarnos un mensaje a travs de MyChart. Por lo general respondemos a los mensajes de MyChart en el transcurso de 1 a 2  das hbiles.  Para renovar recetas, por favor pida a su farmacia que se ponga en contacto con nuestra oficina. Nuestro nmero de fax es el [phone].  Si tiene un asunto urgente cuando la clnica est cerrada y que no puede esperar hasta el siguiente da hbil, puede llamar/localizar a su doctor(a) al nmero que aparece a continuacin.   Por favor, tenga en cuenta que aunque hacemos todo lo posible para estar disponibles para asuntos urgentes fuera del horario de oficina, no estamos disponibles las 24 horas del da, los 7 das de la semana.   Si tiene un problema urgente y no puede comunicarse con nosotros, puede optar por buscar atencin mdica  en el consultorio de su doctor(a), en una clnica privada, en un centro de atencin urgente o en una sala de emergencias.  Si tiene una emergencia mdica, por favor llame inmediatamente al 911 o vaya a la sala de emergencias.  Nmeros de bper  - Dr. Kowalski: [phone]  - Dra. Moye: [phone]  - Dra. Stewart: [phone]  En caso de inclemencias del tiempo, por favor llame a nuestra lnea principal al [phone] para una actualizacin sobre el estado de cualquier retraso o cierre.  Consejos para la medicacin en dermatologa: Por favor, guarde las cajas en las que vienen los medicamentos de uso tpico para ayudarle a seguir las instrucciones sobre dnde y cmo usarlos. Las farmacias generalmente imprimen las instrucciones del medicamento slo en las cajas y no directamente en los tubos del medicamento.   Si su medicamento es muy caro, por favor, pngase en contacto con nuestra oficina llamando al [phone] y presione la opcin 4 o envenos un mensaje a travs de MyChart.   No podemos decirle cul ser su copago por los medicamentos por adelantado ya que esto es diferente dependiendo de la cobertura de su seguro. Sin embargo, es posible que podamos encontrar un medicamento sustituto a menor costo o llenar un formulario para que el  seguro cubra el medicamento que se considera necesario.   Si se requiere una autorizacin previa para que su compaa de seguros cubra su medicamento, por favor permtanos de 1 a 2 das hbiles para completar este proceso.  Los precios de los medicamentos varan con frecuencia dependiendo del lugar de dnde se surte la receta y alguna farmacias pueden ofrecer precios ms baratos.  El sitio web www.goodrx.com tiene cupones para medicamentos de diferentes farmacias. Los precios aqu no tienen en cuenta lo que podra costar con la ayuda del seguro (puede ser ms barato con su seguro), pero el sitio web puede darle el precio si no utiliz ningn seguro.  - Puede imprimir el cupn correspondiente y llevarlo con su receta a la farmacia.  - Tambin puede pasar por nuestra oficina durante el horario de atencin regular y recoger una tarjeta de cupones de GoodRx.  - Si necesita que su receta se enve electrnicamente a una farmacia diferente, informe a nuestra oficina a travs de MyChart de [facility]   o por telfono llamando al [phone] y presione la opcin 4.  

## 2022-03-03 NOTE — Progress Notes (Signed)
   Follow-Up Visit   Subjective  Sherry Oliver is a 61 y.o. female who presents for the following: Follow-up (Patient here today for 3 month AD/Dupixent follow up. Patient on Dupixent and is using desoximetasone 3-4 times weekly to flared areas. Patient feeling fine on Dupixent and reports no side effects. ).  Patient does have some itching at right palm. She was given a sample of Zoryve at last appointment but advises it did not help much.   The following portions of the chart were reviewed this encounter and updated as appropriate:       Review of Systems:  No other skin or systemic complaints except as noted in HPI or Assessment and Plan.  Objective  Well appearing patient in no apparent distress; mood and affect are within normal limits.  A focused examination was performed including hands. Relevant physical exam findings are noted in the Assessment and Plan.  bilateral palms, right forearm Mild hyperkeratosis at right palm, mild erythema bilateral palms Horizontal grooves at mid fingernail plates Light pink scaly patches at right forearm    Assessment & Plan  Other atopic dermatitis bilateral palms, right forearm  With multiple contact allergies (pt avoids environmental exposure to allergens, including propylene glycol) Chronic condition with duration or expected duration over one year. Currently well-controlled on Dupixent.  Few resistant patches on R arm  Atopic dermatitis - Severe, on Dupixent (biologic medication).  Atopic dermatitis (eczema) is a chronic, relapsing, pruritic condition that can significantly affect quality of life. It is often associated with allergic rhinitis and/or asthma and can require treatment with topical medications, phototherapy, or in severe cases a biologic medication called Dupixent, which requires long term medication management.    Dupilumab (Dupixent) is a treatment given by injection for adults and children with moderate-to-severe  atopic dermatitis. Goal is control of skin condition, not cure. It is given as 2 injections at the first dose followed by 1 injection ever 2 weeks thereafter.  Young children are dosed monthly.  Potential side effects include allergic reaction, herpes infections, injection site reactions and conjunctivitis (inflammation of the eyes).  The use of Dupixent requires long term medication management, including periodic office visits.  Restart tacrolimus 0.1% ointment nightly to affected areas arm and occlude.  Continue desoximetasone daily as needed for flares. Avoid applying to face, groin, and axilla. Use as directed. Long-term use can cause thinning of the skin. Continue Dupixent '300mg'$ /93m Q2wks Consider bx to right forearm if not improving  Topical steroids (such as triamcinolone, fluocinolone, fluocinonide, mometasone, clobetasol, halobetasol, betamethasone, hydrocortisone) can cause thinning and lightening of the skin if they are used for too long in the same area. Your physician has selected the right strength medicine for your problem and area affected on the body. Please use your medication only as directed by your physician to prevent side effects.    tacrolimus (PROTOPIC) 0.1 % ointment - bilateral palms, right forearm Apply topically daily.  Related Medications Dupilumab (DUPIXENT) 300 MG/2ML SOPN INJECT 300 MG UNDER THE SKIN EVERY 14 DAYS   Return in about 6 months (around 09/02/2022) for Dermatitis, Dupixent.  IGraciella Belton RMA, am acting as scribe for TBrendolyn Patty MD .  Documentation: I have reviewed the above documentation for accuracy and completeness, and I agree with the above.  TBrendolyn PattyMD

## 2022-03-11 DIAGNOSIS — G4733 Obstructive sleep apnea (adult) (pediatric): Secondary | ICD-10-CM | POA: Diagnosis not present

## 2022-03-12 ENCOUNTER — Other Ambulatory Visit: Payer: Self-pay

## 2022-03-12 DIAGNOSIS — L2089 Other atopic dermatitis: Secondary | ICD-10-CM

## 2022-03-12 MED ORDER — DUPIXENT 300 MG/2ML ~~LOC~~ SOAJ: SUBCUTANEOUS | 1 refills | Status: DC

## 2022-03-12 NOTE — Progress Notes (Signed)
[facility] requested 3 month supply to maximize patients insurance benefits. Escripted in

## 2022-03-22 ENCOUNTER — Emergency Department: Payer: BC Managed Care – PPO

## 2022-03-22 ENCOUNTER — Emergency Department
Admission: EM | Admit: 2022-03-22 | Discharge: 2022-03-22 | Disposition: A | Discharge status: Home / Self Care | Payer: BC Managed Care – PPO | Attending: Student in an Organized Health Care Education/Training Program | Admitting: Student in an Organized Health Care Education/Training Program

## 2022-03-22 ENCOUNTER — Other Ambulatory Visit: Payer: Self-pay

## 2022-03-22 DIAGNOSIS — I10 Essential (primary) hypertension: Secondary | ICD-10-CM | POA: Insufficient documentation

## 2022-03-22 DIAGNOSIS — S5291XA Unspecified fracture of right forearm, initial encounter for closed fracture: Secondary | ICD-10-CM | POA: Diagnosis not present

## 2022-03-22 DIAGNOSIS — R600 Localized edema: Secondary | ICD-10-CM | POA: Diagnosis not present

## 2022-03-22 DIAGNOSIS — W01198A Fall on same level from slipping, tripping and stumbling with subsequent striking against other object, initial encounter: Secondary | ICD-10-CM | POA: Insufficient documentation

## 2022-03-22 DIAGNOSIS — S52501A Unspecified fracture of the lower end of right radius, initial encounter for closed fracture: Secondary | ICD-10-CM | POA: Diagnosis not present

## 2022-03-22 DIAGNOSIS — S52571A Other intraarticular fracture of lower end of right radius, initial encounter for closed fracture: Secondary | ICD-10-CM | POA: Diagnosis not present

## 2022-03-22 DIAGNOSIS — S52601A Unspecified fracture of lower end of right ulna, initial encounter for closed fracture: Secondary | ICD-10-CM

## 2022-03-22 DIAGNOSIS — E039 Hypothyroidism, unspecified: Secondary | ICD-10-CM | POA: Diagnosis not present

## 2022-03-22 DIAGNOSIS — S59911A Unspecified injury of right forearm, initial encounter: Secondary | ICD-10-CM | POA: Diagnosis not present

## 2022-03-22 MED ORDER — OXYCODONE-ACETAMINOPHEN 5-325 MG PO TABS: 1.0000 | ORAL_TABLET | Freq: Four times a day (QID) | ORAL | 0 refills | Status: DC | PRN

## 2022-03-22 MED ORDER — OXYCODONE-ACETAMINOPHEN 5-325 MG PO TABS
1.0000 | ORAL_TABLET | Freq: Once | ORAL | Status: AC
  Administered 2022-03-22: 1 via ORAL
  Filled 2022-03-22: qty 1

## 2022-03-22 NOTE — ED Triage Notes (Signed)
Pt states she was standing on edge of bathtub to fix showerhead and stepped back and fell onto right forearm and wrist. Pt states she hit her head on the wall but it does not hurt, pt denies LOC. Pt presents with arm in sling. Pt states she cannot straighten her fingers.

## 2022-03-22 NOTE — Discharge Instructions (Addendum)
-  Please schedule an appoint with the orthopedic surgeon tomorrow for follow-up.  You must keep your splint on until then.  -Treat pain with ibuprofen as needed.  Utilize oxycodone/acetaminophen sparingly and only as needed.  -Return to the emergency department anytime if you begin to experience any new or worsening symptoms.

## 2022-03-22 NOTE — ED Provider Notes (Signed)
[facility] Provider Note    Event Date/Time   First MD Initiated Contact with Patient 03/22/22 2105     (approximate)   History   Chief Complaint Fall   HPI Sherry Oliver is a 61 y.o. female, history of obesity, hypothyroidism, hyperlipidemia, CVA, hypertension, centrilobular emphysema, presents to the emergency department for evaluation of wrist/forearm pain following mechanical fall.  Patient states that she was sitting on the edge of the bathtub fixing a showerhead when she stepped back and accidentally tripped over a bucket, causing her to fall onto her right forearm/wrist.  She states that she hit her head lightly on the wall, but does not believe that she injured it significantly.  Denies any other injuries.  Denies any preceding symptoms.  Denies numbness/tingling in extremities, cold sensation, chest pain, abdominal pain, neck pain, headache, vision changes, hearing changes, rash/lesions, or shortness of breath.  Denies blood thinner use.  History Limitations: No limitations        Physical Exam  Triage Vital Signs: ED Triage Vitals  Enc Vitals Group     BP 03/22/22 2031 (!) 153/87     Pulse Rate 03/22/22 2031 95     Resp 03/22/22 2031 18     Temp 03/22/22 2031 98.2 F (36.8 C)     Temp Source 03/22/22 2031 Oral     SpO2 03/22/22 2031 98 %     Weight 03/22/22 2032 150 lb (68 kg)     Height 03/22/22 2032 '5\' 2"'$  (1.575 m)     Head Circumference --      Peak Flow --      Pain Score 03/22/22 2032 7     Pain Loc --      Pain Edu? --      Excl. in [place]? --     Most recent vital signs: Vitals:   03/22/22 2031 03/22/22 2322  BP: (!) 153/87 (!) 164/88  Pulse: 95 85  Resp: 18 17  Temp: 98.2 F (36.8 C) 98.2 F (36.8 C)  SpO2: 98% 97%    General: Awake, NAD.  Skin: Warm, dry. No rashes or lesions.  Eyes: PERRL. Conjunctivae normal.  CV: Good peripheral perfusion.  Resp: Normal effort.  Abd: Soft, non-tender. No distention.   Neuro: At baseline. No gross neurological deficits.   Focused Exam: Significant swelling noted in the distal radial/ulnar region in the right upper extremity.  Radial/ulnar pulse intact.  Normal cap refill in all digits.  Sensation intact distally.  Patient maintains limited range of motion of her hand/digits, though limited due to pain.  Significant tenderness along the distal radius/ulna.  No tenderness along the proximal radius/ulna, elbow joint, humerus, or shoulder joint.  No snuffbox tenderness.  No tenderness along the metacarpals or phalanges  Physical Exam    ED Results / Procedures / Treatments  Labs (all labs ordered are listed, but only abnormal results are displayed) Labs Reviewed - No data to display   EKG N/A.   RADIOLOGY  ED Provider Interpretation: I personally viewed and interpreted these images, wrist x-ray shows comminuted, displaced, and angulated distal radius fracture.  Impaction fracture of the distal ulnar metaphysis noted as well.  DG Wrist Complete Right  Result Date: 03/22/2022 CLINICAL DATA:  Fall while standing on edge of bathtub fixing shower head landing on right forearm and wrist. Pain. EXAM: RIGHT WRIST - COMPLETE 3+ VIEW COMPARISON:  None Available. FINDINGS: Comminuted displaced distal radius fracture extends into the radiocarpal and distal  radioulnar joint. There is apex volar angulation. Impaction fracture of the distal ulnar metaphysis. There may also be a faint avulsion fracture from the ulna styloid. Carpal bones and metacarpals appear intact. Soft tissue edema is seen at the fracture site. IMPRESSION: Comminuted, displaced, and angulated intra-articular distal radius fracture. Impaction fracture of the distal ulnar metaphysis. Possible faint avulsion fracture from the ulna styloid. Electronically Signed   By: Keith Rake M.D.   On: 03/22/2022 21:12   DG Forearm Right  Result Date: 03/22/2022 CLINICAL DATA:  Fall while standing on edge of  bathtub fixing shower head landing on right forearm and wrist. Pain. EXAM: RIGHT FOREARM - 2 VIEW COMPARISON:  None Available. FINDINGS: Comminuted displaced distal radial fracture. Apex volar angulation. Fracture extends into the radiocarpal and distal radioulnar joint. Impaction fracture to the distal ulna is better appreciated on concurrent wrist radiographs. Soft tissue edema is seen at the fracture site elbow alignment is maintained. There may be a faint avulsion fracture from the ulna styloid. IMPRESSION: 1. Comminuted displaced and angulated distal radial fracture extending into the radiocarpal and distal radioulnar joints. 2. Distal ulna fracture better appreciated on concurrent wrist exam. Electronically Signed   By: Keith Rake M.D.   On: 03/22/2022 21:11    PROCEDURES:  Critical Care performed: N/A.  Procedures    MEDICATIONS ORDERED IN ED: Medications  oxyCODONE-acetaminophen (PERCOCET/ROXICET) 5-325 MG per tablet 1 tablet (1 tablet Oral Given 03/22/22 2149)  oxyCODONE-acetaminophen (PERCOCET/ROXICET) 5-325 MG per tablet 1 tablet (1 tablet Oral Given 03/22/22 2316)     IMPRESSION / MDM / ASSESSMENT AND PLAN / ED COURSE  I reviewed the triage vital signs and the nursing notes.                              Differential diagnosis includes, but is not limited to, distal radius fracture, distal ulnar fracture,  ED Course Patient appears well, currently nursing moderate severe pain.  We will go ahead treat with oxycodone acetaminophen  Assessment/Plan Patient presents with wrist/forearm injury following mechanical fall.  Found to have comminuted distal radius/ulnar fracture with displacement and angulation.  Considered reduction, though I do not believe that it would be successful in this case given the comminuted nature.  She is neurovascularly intact distally.  We will place her in a sugar-tong splint and sling ,and provide her with a prescription for analgesics.  We will provide  her with a orthopedic referral as well for further evaluation and management.  In regards to her head injury, she states that she did not hit her head significantly.  Given her age, offered CT imaging, though she states that she does not want this and does not believe it is necessary.  We will plan to discharge.  Patient's presentation is most consistent with acute complicated illness / injury requiring diagnostic workup.   Provided the patient with anticipatory guidance, return precautions, and educational material. Encouraged the patient to return to the emergency department at any time if they begin to experience any new or worsening symptoms. Patient expressed understanding and agreed with the plan.       FINAL CLINICAL IMPRESSION(S) / ED DIAGNOSES   Final diagnoses:  Other closed intra-articular fracture of distal end of right radius, initial encounter  Closed fracture of distal end of right ulna, unspecified fracture morphology, initial encounter     Rx / DC Orders   ED Discharge Orders  Ordered    oxyCODONE-acetaminophen (PERCOCET) 5-325 MG tablet  Every 6 hours PRN        03/22/22 2311             Note:  This document was prepared using Dragon voice recognition software and may include unintentional dictation errors.   Teodoro Spray, [place] 03/23/22 Dyann Kief    Nance Pear, MD 03/23/22 [phone]

## 2022-03-22 NOTE — ED Notes (Signed)
Pt verbalized understanding of discharge instructions, prescriptions, and follow-up care instructions. Pt advised if symptoms worsen to return to ED.

## 2022-03-22 NOTE — ED Notes (Signed)
Arm sling applied to right arm. Teach back method used to apply sling.

## 2022-03-23 ENCOUNTER — Other Ambulatory Visit: Payer: Self-pay | Admitting: Orthopedic Surgery

## 2022-03-23 DIAGNOSIS — I6930 Unspecified sequelae of cerebral infarction: Secondary | ICD-10-CM | POA: Diagnosis not present

## 2022-03-23 DIAGNOSIS — S52501A Unspecified fracture of the lower end of right radius, initial encounter for closed fracture: Secondary | ICD-10-CM | POA: Diagnosis not present

## 2022-03-24 ENCOUNTER — Other Ambulatory Visit: Payer: Self-pay

## 2022-03-24 ENCOUNTER — Encounter: Payer: Self-pay | Admitting: Orthopedic Surgery

## 2022-03-24 ENCOUNTER — Ambulatory Visit: Payer: BC Managed Care – PPO | Admitting: Anesthesiology

## 2022-03-24 ENCOUNTER — Ambulatory Visit: Payer: BC Managed Care – PPO

## 2022-03-24 ENCOUNTER — Encounter
Admission: RE | Disposition: A | Discharge status: Home / Self Care | Payer: Self-pay | Source: Home / Self Care | Attending: Orthopedic Surgery

## 2022-03-24 ENCOUNTER — Ambulatory Visit
Admission: RE | Admit: 2022-03-24 | Discharge: 2022-03-24 | Disposition: A | Discharge status: Home / Self Care | Payer: BC Managed Care – PPO | Attending: Orthopedic Surgery | Admitting: Orthopedic Surgery

## 2022-03-24 DIAGNOSIS — Z87891 Personal history of nicotine dependence: Secondary | ICD-10-CM | POA: Insufficient documentation

## 2022-03-24 DIAGNOSIS — S52501A Unspecified fracture of the lower end of right radius, initial encounter for closed fracture: Secondary | ICD-10-CM | POA: Diagnosis not present

## 2022-03-24 DIAGNOSIS — Z8673 Personal history of transient ischemic attack (TIA), and cerebral infarction without residual deficits: Secondary | ICD-10-CM | POA: Insufficient documentation

## 2022-03-24 DIAGNOSIS — S52571A Other intraarticular fracture of lower end of right radius, initial encounter for closed fracture: Secondary | ICD-10-CM | POA: Diagnosis not present

## 2022-03-24 DIAGNOSIS — G4730 Sleep apnea, unspecified: Secondary | ICD-10-CM | POA: Diagnosis not present

## 2022-03-24 DIAGNOSIS — I10 Essential (primary) hypertension: Secondary | ICD-10-CM | POA: Diagnosis not present

## 2022-03-24 DIAGNOSIS — J449 Chronic obstructive pulmonary disease, unspecified: Secondary | ICD-10-CM | POA: Diagnosis not present

## 2022-03-24 DIAGNOSIS — S52601A Unspecified fracture of lower end of right ulna, initial encounter for closed fracture: Secondary | ICD-10-CM | POA: Diagnosis not present

## 2022-03-24 DIAGNOSIS — J432 Centrilobular emphysema: Secondary | ICD-10-CM | POA: Diagnosis not present

## 2022-03-24 DIAGNOSIS — Y92002 Bathroom of unspecified non-institutional (private) residence single-family (private) house as the place of occurrence of the external cause: Secondary | ICD-10-CM | POA: Diagnosis not present

## 2022-03-24 DIAGNOSIS — W010XXA Fall on same level from slipping, tripping and stumbling without subsequent striking against object, initial encounter: Secondary | ICD-10-CM | POA: Insufficient documentation

## 2022-03-24 DIAGNOSIS — E039 Hypothyroidism, unspecified: Secondary | ICD-10-CM | POA: Diagnosis not present

## 2022-03-24 DIAGNOSIS — S52501D Unspecified fracture of the lower end of right radius, subsequent encounter for closed fracture with routine healing: Secondary | ICD-10-CM | POA: Diagnosis not present

## 2022-03-24 DIAGNOSIS — Z01818 Encounter for other preprocedural examination: Secondary | ICD-10-CM

## 2022-03-24 DIAGNOSIS — K219 Gastro-esophageal reflux disease without esophagitis: Secondary | ICD-10-CM | POA: Insufficient documentation

## 2022-03-24 DIAGNOSIS — G8918 Other acute postprocedural pain: Secondary | ICD-10-CM | POA: Diagnosis not present

## 2022-03-24 DIAGNOSIS — S52691A Other fracture of lower end of right ulna, initial encounter for closed fracture: Secondary | ICD-10-CM | POA: Diagnosis not present

## 2022-03-24 HISTORY — PX: OPEN REDUCTION INTERNAL FIXATION (ORIF) DISTAL RADIAL FRACTURE: SHX5989

## 2022-03-24 SURGERY — OPEN REDUCTION INTERNAL FIXATION (ORIF) DISTAL RADIUS FRACTURE
Anesthesia: Regional | Site: Wrist | Laterality: Right

## 2022-03-24 MED ORDER — PROPOFOL 10 MG/ML IV BOLUS
INTRAVENOUS | Status: DC | PRN
  Administered 2022-03-24: 120 mg via INTRAVENOUS

## 2022-03-24 MED ORDER — FENTANYL CITRATE PF 50 MCG/ML IJ SOSY
PREFILLED_SYRINGE | INTRAMUSCULAR | Status: AC
  Administered 2022-03-24: 50 ug via INTRAVENOUS
  Filled 2022-03-24: qty 1

## 2022-03-24 MED ORDER — ONDANSETRON HCL 4 MG/2ML IJ SOLN
INTRAMUSCULAR | Status: DC | PRN
  Administered 2022-03-24: 4 mg via INTRAVENOUS

## 2022-03-24 MED ORDER — BUPIVACAINE HCL (PF) 0.5 % IJ SOLN
INTRAMUSCULAR | Status: AC
  Filled 2022-03-24: qty 20

## 2022-03-24 MED ORDER — ORAL CARE MOUTH RINSE: 15.0000 mL | Freq: Once | OROMUCOSAL | Status: DC

## 2022-03-24 MED ORDER — LIDOCAINE HCL (PF) 2 % IJ SOLN
INTRAMUSCULAR | Status: AC
  Filled 2022-03-24: qty 5

## 2022-03-24 MED ORDER — DEXAMETHASONE SODIUM PHOSPHATE 10 MG/ML IJ SOLN
INTRAMUSCULAR | Status: DC | PRN
  Administered 2022-03-24: 10 mg via INTRAVENOUS

## 2022-03-24 MED ORDER — CEFAZOLIN SODIUM-DEXTROSE 2-4 GM/100ML-% IV SOLN
2.0000 g | INTRAVENOUS | Status: AC
  Administered 2022-03-24: 2 g via INTRAVENOUS

## 2022-03-24 MED ORDER — MIDAZOLAM HCL 2 MG/2ML IJ SOLN
INTRAMUSCULAR | Status: DC | PRN
  Administered 2022-03-24: 1 mg via INTRAVENOUS

## 2022-03-24 MED ORDER — BUPIVACAINE HCL (PF) 0.5 % IJ SOLN
INTRAMUSCULAR | Status: DC | PRN
  Administered 2022-03-24: 20 mL

## 2022-03-24 MED ORDER — MIDAZOLAM HCL 2 MG/2ML IJ SOLN
INTRAMUSCULAR | Status: AC
  Administered 2022-03-24: 1 mg via INTRAVENOUS
  Filled 2022-03-24: qty 2

## 2022-03-24 MED ORDER — FENTANYL CITRATE (PF) 100 MCG/2ML IJ SOLN
INTRAMUSCULAR | Status: AC
  Filled 2022-03-24: qty 2

## 2022-03-24 MED ORDER — FENTANYL CITRATE (PF) 100 MCG/2ML IJ SOLN: 25.0000 ug | INTRAMUSCULAR | Status: DC | PRN

## 2022-03-24 MED ORDER — PHENYLEPHRINE 80 MCG/ML (10ML) SYRINGE FOR IV PUSH (FOR BLOOD PRESSURE SUPPORT)
PREFILLED_SYRINGE | INTRAVENOUS | Status: DC | PRN
  Administered 2022-03-24 (×8): 80 ug via INTRAVENOUS

## 2022-03-24 MED ORDER — LACTATED RINGERS IV SOLN: INTRAVENOUS | Status: DC

## 2022-03-24 MED ORDER — FENTANYL CITRATE (PF) 100 MCG/2ML IJ SOLN
INTRAMUSCULAR | Status: DC | PRN
  Administered 2022-03-24: 25 ug via INTRAVENOUS

## 2022-03-24 MED ORDER — ONDANSETRON HCL 4 MG/2ML IJ SOLN: 4.0000 mg | Freq: Once | INTRAMUSCULAR | Status: DC | PRN

## 2022-03-24 MED ORDER — SODIUM CHLORIDE 0.9 % IV SOLN: INTRAVENOUS | Status: DC

## 2022-03-24 MED ORDER — ACETAMINOPHEN 10 MG/ML IV SOLN: 1000.0000 mg | Freq: Once | INTRAVENOUS | Status: DC | PRN

## 2022-03-24 MED ORDER — 0.9 % SODIUM CHLORIDE (POUR BTL) OPTIME
TOPICAL | Status: DC | PRN
  Administered 2022-03-24: 500 mL

## 2022-03-24 MED ORDER — FENTANYL CITRATE PF 50 MCG/ML IJ SOSY: 50.0000 ug | PREFILLED_SYRINGE | Freq: Once | INTRAMUSCULAR | Status: AC

## 2022-03-24 MED ORDER — CHLORHEXIDINE GLUCONATE 0.12 % MT SOLN: 15.0000 mL | Freq: Once | OROMUCOSAL | Status: DC

## 2022-03-24 MED ORDER — OXYCODONE HCL 5 MG/5ML PO SOLN: 5.0000 mg | Freq: Once | ORAL | Status: DC | PRN

## 2022-03-24 MED ORDER — DEXAMETHASONE SODIUM PHOSPHATE 10 MG/ML IJ SOLN
INTRAMUSCULAR | Status: AC
  Filled 2022-03-24: qty 3

## 2022-03-24 MED ORDER — LIDOCAINE HCL (CARDIAC) PF 100 MG/5ML IV SOSY
PREFILLED_SYRINGE | INTRAVENOUS | Status: DC | PRN
  Administered 2022-03-24: 70 mg via INTRAVENOUS

## 2022-03-24 MED ORDER — OXYCODONE-ACETAMINOPHEN 5-325 MG PO TABS: 1.0000 | ORAL_TABLET | Freq: Four times a day (QID) | ORAL | 0 refills | Status: DC | PRN

## 2022-03-24 MED ORDER — MIDAZOLAM HCL 2 MG/2ML IJ SOLN
INTRAMUSCULAR | Status: AC
  Filled 2022-03-24: qty 2

## 2022-03-24 MED ORDER — ONDANSETRON HCL 4 MG/2ML IJ SOLN
INTRAMUSCULAR | Status: AC
  Filled 2022-03-24: qty 6

## 2022-03-24 MED ORDER — MIDAZOLAM HCL 2 MG/2ML IJ SOLN: 1.0000 mg | INTRAMUSCULAR | Status: DC | PRN

## 2022-03-24 MED ORDER — CEFAZOLIN SODIUM-DEXTROSE 2-4 GM/100ML-% IV SOLN
INTRAVENOUS | Status: AC
  Filled 2022-03-24: qty 100

## 2022-03-24 MED ORDER — OXYCODONE HCL 5 MG PO TABS: 5.0000 mg | ORAL_TABLET | Freq: Once | ORAL | Status: DC | PRN

## 2022-03-24 SURGICAL SUPPLY — 42 items
BIT DRILL 2 FAST STEP (BIT) ×1 IMPLANT
BIT DRILL 2.5X4 QC (BIT) ×1 IMPLANT
BNDG ELASTIC 4X5.8 VLCR STR LF (GAUZE/BANDAGES/DRESSINGS) ×2 IMPLANT
CHLORAPREP W/TINT 26 (MISCELLANEOUS) ×2 IMPLANT
CUFF TOURN SGL QUICK 18X4 (TOURNIQUET CUFF) IMPLANT
DRAPE FLUOR MINI C-ARM 54X84 (DRAPES) ×2 IMPLANT
ELECT REM PT RETURN 9FT ADLT (ELECTROSURGICAL) ×2
ELECTRODE REM PT RTRN 9FT ADLT (ELECTROSURGICAL) ×1 IMPLANT
GAUZE SPONGE 4X4 12PLY STRL (GAUZE/BANDAGES/DRESSINGS) ×2 IMPLANT
GAUZE XEROFORM 1X8 LF (GAUZE/BANDAGES/DRESSINGS) ×4 IMPLANT
GLOVE SURG SYN 9.0  PF PI (GLOVE) ×1
GLOVE SURG SYN 9.0 PF PI (GLOVE) ×1 IMPLANT
GOWN SRG 2XL LVL 4 RGLN SLV (GOWNS) ×1 IMPLANT
GOWN STRL NON-REIN 2XL LVL4 (GOWNS) ×1
GOWN STRL REUS W/ TWL LRG LVL3 (GOWN DISPOSABLE) ×1 IMPLANT
GOWN STRL REUS W/TWL LRG LVL3 (GOWN DISPOSABLE) ×1
K-WIRE 1.6 (WIRE) ×1
K-WIRE FX5X1.6XNS BN SS (WIRE) ×1
KIT TURNOVER KIT A (KITS) ×2 IMPLANT
KWIRE FX5X1.6XNS BN SS (WIRE) IMPLANT
MANIFOLD NEPTUNE II (INSTRUMENTS) ×2 IMPLANT
NDL FILTER BLUNT 18X1 1/2 (NEEDLE) ×1 IMPLANT
NEEDLE FILTER BLUNT 18X 1/2SAF (NEEDLE) ×1
NEEDLE FILTER BLUNT 18X1 1/2 (NEEDLE) ×1 IMPLANT
NS IRRIG 500ML POUR BTL (IV SOLUTION) ×2 IMPLANT
PACK EXTREMITY ARMC (MISCELLANEOUS) ×2 IMPLANT
PAD CAST CTTN 4X4 STRL (SOFTGOODS) ×2 IMPLANT
PADDING CAST COTTON 4X4 STRL (SOFTGOODS) ×2
PEG SUBCHONDRAL SMOOTH 2.0X14 (Peg) ×1 IMPLANT
PEG SUBCHONDRAL SMOOTH 2.0X18 (Peg) ×1 IMPLANT
PEG SUBCHONDRAL SMOOTH 2.0X20 (Peg) ×3 IMPLANT
PEG SUBCHONDRAL SMOOTH 2.0X22 (Peg) ×1 IMPLANT
PLATE SHORT 21.6X48.9 NRRW RT (Plate) ×1 IMPLANT
SCALPEL PROTECTED #15 DISP (BLADE) ×4 IMPLANT
SCREW CORT 3.5X10 LNG (Screw) ×3 IMPLANT
SPLINT CAST 1 STEP 3X12 (MISCELLANEOUS) ×2 IMPLANT
SUT ETHILON 4-0 (SUTURE) ×1
SUT ETHILON 4-0 FS2 18XMFL BLK (SUTURE) ×1
SUT VICRYL 3-0 27IN (SUTURE) ×2 IMPLANT
SUTURE ETHLN 4-0 FS2 18XMF BLK (SUTURE) ×1 IMPLANT
SYR 3ML LL SCALE MARK (SYRINGE) ×2 IMPLANT
WATER STERILE IRR 500ML POUR (IV SOLUTION) ×2 IMPLANT

## 2022-03-24 NOTE — Transfer of Care (Signed)
/  Immediate Anesthesia Transfer of Care Note  Patient: SHAKURA COWING  Procedure(s) Performed: ORIF right distal radius fracture (Right: Wrist)  Patient Location: PACU  Anesthesia Type:General  Level of Consciousness: drowsy  Airway & Oxygen Therapy: Patient Spontanous Breathing and Patient connected to face mask oxygen  Post-op Assessment: Report given to RN and Post -op Vital signs reviewed and stable  Post vital signs: Reviewed and stable  Last Vitals:  Vitals Value Taken Time  BP 97/54   Temp    Pulse 70 03/24/22 1254  Resp 12 03/24/22 1254  SpO2 97 % 03/24/22 1254  Vitals shown include unvalidated device data.  Last Pain:  Vitals:   03/24/22 1018  TempSrc: Temporal  PainSc: 6          Complications: No notable events documented.

## 2022-03-24 NOTE — Anesthesia Preprocedure Evaluation (Addendum)
Anesthesia Evaluation  Patient identified by MRN, date of birth, ID band Patient awake    Reviewed: Allergy & Precautions, NPO status , Patient's Chart, lab work & pertinent test results  History of Anesthesia Complications Negative for: history of anesthetic complications  Airway Mallampati: III   Neck ROM: Full    Dental  (+) Upper Dentures, Lower Dentures   Pulmonary sleep apnea , COPD, former smoker (quit 2019),    Pulmonary exam normal breath sounds clear to auscultation       Cardiovascular hypertension, Normal cardiovascular exam Rhythm:Regular Rate:Normal  ECG 10/16/21: NSR, nonspecific ST and T waves changes  Myocardial perfusion 09/29/18: Negative Lexiscan stress. LV function normal. No evidence of reversible  ischemia. Low risk study.  Echo 09/27/18:  NORMAL LEFT VENTRICULAR SYSTOLIC FUNCTION  NORMAL RIGHT VENTRICULAR SYSTOLIC FUNCTION  TRIVIAL REGURGITATION NOTED  NO VALVULAR STENOSIS  Closest EF: >55% (Estimated)  Mitral: TRIVIAL MR  Tricuspid: TRIVIAL TR    Neuro/Psych  Headaches, TIA   GI/Hepatic GERD  ,  Endo/Other  Hypothyroidism   Renal/GU negative Renal ROS     Musculoskeletal   Abdominal   Peds  Hematology negative hematology ROS (+)   Anesthesia Other Findings Cardiology note 10/16/21:  61 y.o. female with  ICD-10-CM  1. Essential hypertension blood pressure has been fairly well controlled with diet alone. She attempts to eat a low-sodium diet. I10 ECG 12-lead   2. Acquired hypothyroidism. TSH most recently was 0.49. We will continue to follow.  3. Tachycardia-has not had a problem with this recently. No sustained SVT. Holter showed average heart rate around 100. Does go down to 70 at night.  4. TIA-etiology unclear. No AT fib noted on monitor. No further neurologic events.  5. Lower back pain-appears to be a muscular strain. If it does not improve, she will seek attention for  this.  Return in about 6 months (around 04/15/2022).   Reproductive/Obstetrics                            Anesthesia Physical Anesthesia Plan  ASA: 3  Anesthesia Plan: General and Regional   Post-op Pain Management: Regional block*   Induction: Intravenous  PONV Risk Score and Plan: 3 and Ondansetron, Dexamethasone and Treatment may vary due to age or medical condition  Airway Management Planned: LMA  Additional Equipment:   Intra-op Plan:   Post-operative Plan: Extubation in OR  Informed Consent: I have reviewed the patients History and Physical, chart, labs and discussed the procedure including the risks, benefits and alternatives for the proposed anesthesia with the patient or authorized representative who has indicated his/her understanding and acceptance.     Dental advisory given  Plan Discussed with: CRNA  Anesthesia Plan Comments: (Plan for preoperative supraclavicular nerve block and GA with LMA.  Note that pt has documented allergies to benzocaine and tetracaine, but told me that she has received lidocaine previously without reaction.  Patient consented for risks of anesthesia including but not limited to:  - adverse reactions to medications - damage to eyes, teeth, lips or other oral mucosa - nerve damage due to positioning  - sore throat or hoarseness - damage to heart, brain, nerves, lungs, other parts of body or loss of life  Informed patient about role of CRNA in peri- and intra-operative care.  Patient voiced understanding.)       Anesthesia Quick Evaluation

## 2022-03-24 NOTE — Anesthesia Procedure Notes (Signed)
Procedure Name: LMA Insertion Date/Time: 03/24/2022 12:06 PM  Performed by: Lily Peer, Wakeelah Solan, CRNAPre-anesthesia Checklist: Patient identified, Emergency Drugs available, Suction available and Patient being monitored Patient Re-evaluated:Patient Re-evaluated prior to induction Oxygen Delivery Method: Circle system utilized Preoxygenation: Pre-oxygenation with 100% oxygen Induction Type: IV induction Ventilation: Mask ventilation without difficulty LMA: LMA inserted LMA Size: 3.0 Number of attempts: 1 Placement Confirmation: positive ETCO2 and breath sounds checked- equal and bilateral Tube secured with: Tape Dental Injury: Teeth and Oropharynx as per pre-operative assessment

## 2022-03-24 NOTE — H&P (Signed)
Chief Complaint  Patient presents with  Right Wrist - Pain, Pre-op Exam   Reason for Visit The patient is a 61 year old female that presented for complaints of her right wrist. The patient states that yesterday on March 22, 2022 she was sitting on the edge of her bathtub fixing a showerhead when she stepped back and accidentally tripped over a bucket and fell on her outstretched right wrist. She went to the emergency room immediately where x-rays showed a displaced distal radius fracture. She did slightly hit her head. She has a history of a stroke and is currently not on any blood thinners. She was on Plavix. She was splinted for protection and given oxycodone for pain medication. The patient is not a diabetic.  Medications Current Outpatient Medications  Medication Sig Dispense Refill  acetaminophen (TYLENOL) 500 MG tablet Take 500 mg by mouth every 8 (eight) hours as needed  albuterol 90 mcg/actuation inhaler Inhale 2 inhalations into the lungs every 6 (six) hours as needed for Wheezing 1 each 10  ascorbic acid, vitamin C, (VITAMIN C) 1000 MG tablet Take by mouth.  aspirin 81 MG chewable tablet Take 81 mg by mouth once daily  b complex vitamins tablet Take by mouth.  calcium carbonate-vitamin D3 (CALTRATE 600+D) 600 mg(1,515m) -400 unit tablet Take by mouth.  diclofenac (VOLTAREN) 1 % topical gel Apply 2 g topically 3 (three) times daily 100 g 3  dupilumab (DUPIXENT PEN) 300 mg/2 mL pen injector INJECT 600 MG (2 PENS) UNDER THE SKIN ON DAY 1, THEN 300 MG (1 PEN) EVERY 2 WEEKS STARTING ON DAY 15 AS DIRECTED  EPINEPHrine (EPIPEN) 0.3 mg/0.3 mL auto-injector INJECT 0.3 ML (0.3 MG TOTAL) INTO THE MUSCLE ONCE AS NEEDED FOR ANAPHYLAXIS FOR UP TO 1 DOSE  esomeprazole (NEXIUM) 20 MG DR capsule Take 20 mg by mouth once daily  hydroCHLOROthiazide (HYDRODIURIL) 25 MG tablet TAKE 1 TABLET (25 MG TOTAL) BY MOUTH DAILY.-- STOP AMLODIPINE  liothyronine (CYTOMEL) 5 MCG tablet Take 1 tablet (5 mcg total) by  mouth once daily 90 tablet 3  multivitamin with iron-minerals (SUPER THERA VITE M) tablet Take by mouth. Reported on 09/19/2015  pramipexole (MIRAPEX) 0.75 MG tablet Take 1 tablet (0.75 mg total) by mouth at bedtime 90 tablet 3  pravastatin (PRAVACHOL) 10 MG tablet Take 10 mg by mouth at bedtime Patient unsure of dosage  clopidogreL (PLAVIX) 75 mg tablet Take 21 mg by mouth once daily (Patient not taking: Reported on 03/23/2022)  levothyroxine (SYNTHROID) 88 MCG tablet Take 1 tablet (88 mcg total) by mouth once daily Take on an empty stomach with a glass of water at least 30-60 minutes before breakfast. 90 tablet 4   No current facility-administered medications for this visit.   Allergies Balsam p[place] Benzalkonium chloride, Benzocaine, Bronopol, Chlorzoxazone, Chromium, Cobalt, Cocamidopropyl betaine, Crestor [rosuvastatin], Dibucaine, Formaldehyde, Glutaraldehyde, Latex, Methyldibromoglutaronitrile, Methylisothiazolinone, Nickel sulfate, Others, Others, Paraben, Parthenolide (from feverfew), Penicillins, Propylene glycol, Quatemium-15, Sulfa (sulfonamide antibiotics), Sulfur (not sulfa), Tetracaine, Tomato, Cobalt, and Other  Histories Past Medical History: Past Medical History:  Diagnosis Date  Adrenal nodule (CMS-HCC)  Left on CT 2009.  Allergic state  Allergic to many things  Centrilobular emphysema (CMS-HCC) 11/17/2019  Colon polyp  Contact dermatitis  CVA (cerebral vascular accident) (CMS-HCC) 12/01/2020  Diverticulosis 02/19/2015  Essential hypertension 12/06/2020  Food intolerance  Gastropathy  On upper endoscopy 08/10/12 with Dr. HPhylis Bougie GERD (gastroesophageal reflux disease)  History of stroke  Hyperlipidemia  Hyperplastic colon polyp 02/19/2015  Hypothyroidism  Internal hemorrhoids 02/19/2015  Lumbar radiculopathy 02/05/2015  Lung nodule  on CT 2009  Migraine headache  Ocular  Prediabetes  Sleep apnea  Tubular adenoma of colon 02/19/2015   Past Surgical  History: Past Surgical History:  Procedure Laterality Date  BLADDER SURGERY 1965  stretch bladder stem  RIGHT ELBOW SURGERY 1982  no replacement or repositioning  ABDOMINAL ADHESION SURGERY 2006  APPENDECTOMY 2008  ruptured appendix  COLONOSCOPY 05/30/2009  Repeat 05/30/2014  COLONOSCOPY 02/19/2015  Dr. Kurtis Bushman @ [facility] [place] - Tubular adenoma, 12 mm polyp, rpt 85yr per MUS  COLONOSCOPY 03/24/2018  Diverticulosis/Otherwise normal/PHx CP/Repeat 557yrMUS  LEFT FOOT NEUROMA REMOVAL 2007 and 2008  SIGMOIDOSCOPY  UPPER GASTROINTESTINAL ENDOSCOPY   Social History: Social History   Socioeconomic History  Marital status: Single  Number of children: 0  Occupational History  Occupation: coElectrical engineerTobacco Use  Smoking status: Former  Packs/day: 1.00  Years: 45.00  Pack years: 45.00  Types: Cigarettes  Quit date: 04/11/2018  Years since quitting: 3.9  Smokeless tobacco: Never  Vaping Use  Vaping Use: Never used  Substance and Sexual Activity  Alcohol use: No  Alcohol/week: 0.0 standard drinks  Drug use: No  Sexual activity: Never  Other Topics Concern  Would you please tell usKoreabout the people who live in your home, your pets, or anything else important to your social life? No  Social History Narrative  Single, helps mom who is living with her. No children. Works as a coDance movement psychotherapist at [employer] caffeine intake. Exercise: walking dogs, yard work, yoga. Dentist: yes. Calcium in diet: yes. Wears seatbelt, avoids sun. Religious beliefs affecting health care: none.   Advanced Directives: mom knows.    Family History: Family History  Problem Relation Age of Onset  High blood pressure (Hypertension) Mother  Breast cancer Mother 6037Obesity Mother  Osteoporosis (Thinning of bones) Mother  Other Mother  Kidney cancer  Colon polyps Mother  BRCA2 Positive Mother  High blood pressure (Hypertension) Brother  Hyperlipidemia (Elevated  cholesterol) Brother  Lung cancer Father  Alcohol abuse Father  Breast cancer Maternal Aunt 52  Diabetes type II Maternal Grandmother  Parkinsonism Maternal Grandfather  Breast cancer Maternal Grandfather  Alzheimer's disease Maternal Grandfather  Stroke Paternal Grandmother  Depression Paternal Grandfather  Alcohol abuse Paternal Grandfather  High blood pressure (Hypertension) Brother   Review of Systems A comprehensive 14 point ROS was performed, reviewed, and the pertinent orthopaedic findings are documented in the HPI.  Exam BP (!) 146/87  Pulse 90  Ht 152.4 cm (5')  Wt 68.9 kg (152 lb)  BMI 29.69 kg/m   General/Constitutional: NAD, conversant Eyes: Pupils equal and round, extraocular movements intact ENT: atraumatic external nose and ears, moist mucous membranes Respiratory: non-labored breathing, symmetric chest rise, chest sounds clear. Cardiovascular: no visible lower extremity edema, peripheral pulses present, regular rate and rhythm  Skin: normal skin turgor, warm and dry Neurological: cranial nerves grossly intact, sensation grossly intact Psychological: Appropriate mood and affect; appropriate judgment Musculoskeletal: as detailed below:  General: Well developed, well nourished [age].o. female in no apparent distress. Normal affect. Normal communication. Patient answers questions appropriately. The patient has a normal gait. There is no antalgic component. There is no hip lurch.   Heart: Examination of the heart reveals regular, rate, and rhythm. There is no murmur noted on ascultation. There is a normal apical pulse.  Lungs: Lungs are clear to auscultation. There is no wheeze, rhonchi, or crackles. There is normal expansion of  bilateral chest walls.   Right upper extremity: Examination of the right wrist and arm reveals the sugar-tong splint in good position. No removal is done today. The patient is quite tender to palpation involving the distal radial region.  The patient has very limited motion of the fingers. She has neurovascular that is intact with good sensation.  Xrays: Xrays were reviewed of the right wrist that were taken at [facility] on March 22, 2022. The xrays showed a comminuted displaced and angulated intra-articular distal radius fracture with impaction of the distal ulna metaphysis. There is minimal avulsion fracture of the ulnar styloid.  Impression Encounter Diagnoses  Name Primary?  Closed traumatic displaced fracture of distal end of right radius, initial encounter Yes  History of cerebrovascular accident (CVA) with residual deficit   Plan  1. I discussed the physical exam findings and x-ray results with the patient and with Dr. Rudene Christians. We discussed the situation and the mechanism of injury. 2. I discussed doing a right distal radius ORIF with Dr. Rudene Christians tomorrow on March 24, 2022. I reviewed the risks, benefits, and alternatives to the surgery. The patient seems understand and wants to do surgery that day. 3. She will take only the Synthroid and Nexium in the morning. She can use pain medication in the morning if needed. No other food or drink after midnight. 4. We did discuss postoperative care. 5. The patient will follow-up after surgery in the next 2-week for wound care.  This note will serve as the history and physical for surgery scheduled on March 24, 2022 for a right distal radius ORIF to be done by Dr. Rudene Christians. The expected discharge plan is for the patient to go home after the outpatient surgery.  This note was generated in part with voice recognition software and I apologize for any typographical errors that were not detected and corrected.  Par[place] Ranger, M.S.    Electronically signed by Scarlett Presto, PA at 03/23/2022 4:37 PM EDT  Reviewed  H+P. No changes noted.

## 2022-03-24 NOTE — Op Note (Addendum)
03/24/2022  12:59 PM  PATIENT:  Sherry Oliver  61 y.o. female  PRE-OPERATIVE DIAGNOSIS:  Other closed intra-articular fracture of distal end of right radius, initial encounter  S52.501A Closed fracture of distal end of right ulna, unspecified fracture morphology, initial encounter  S52.691A  POST-OPERATIVE DIAGNOSIS:  Other closed intra-articular fracture of distal end of right radius, initial encounter  S52.501A Closed fracture of distal end of right ulna, unspecified fracture morphology, initial encounter  S52.691A  PROCEDURE:  Procedure(s): ORIF right distal radius fracture (Right)  SURGEON: Laurene Footman, MD  ASSISTANTS: None  ANESTHESIA:   general  EBL:  Total I/O In: 800 [I.V.:800] Out: 5 [Blood:5]  BLOOD ADMINISTERED:none  DRAINS: none   LOCAL MEDICATIONS USED:  OTHER block given preoperatively by anesthesia  SPECIMEN:  No Specimen  DISPOSITION OF SPECIMEN:  N/A  COUNTS:  YES  TOURNIQUET:   Total Tourniquet Time Documented: Upper Arm (Right) - 134 minutes Total: Upper Arm (Right) - 134 minutes Actual tourniquet time 13 minutes  IMPLANTS: [employer] innovations short narrow DVR plate with multiple smooth pegs and screws  DICTATION: .Dragon Dictation patient received preoperative block for postop analgesia.  Patient was then brought to the operating room and after adequate general anesthesia was obtained the right arm was prepped and draped in the usual sterile fashion with a tourniquet applied the upper arm.  After patient identification timeout procedures were completed fingertrap traction with 10 pounds of traction were applied along with inflation of the tourniquet.  Volar approach was made centered over the FCR tendon the tendon sheath was incised and tendon retracted radially along with the radial artery and associated veins.  Deep fascia incised and retractor placed to expose the pronator which was elevated off the radial border of the proximal fragment and  had already been stripped off the distal fragment.  Freer elevator was used to reduce the fracture and a plate was applied with distal first technique feeling all peg holes after first placing a K wire to hold the plate in position and checking that it was in appropriate position medial lateral as well as distal.  After all smooth pegs were placed 310 mm cortical screws were placed and traction was removed the fracture appeared near anatomically aligned with stable to range of motion.  The wound was irrigated and the tourniquet let down.  The wound was then closed with 3-0 Vicryl subcutaneously and 4-0 nylon for the skin.  Xeroform 4 x 4 web roll volar splint and Ace wrap applied.  PLAN OF CARE: Discharge to home after PACU  PATIENT DISPOSITION:  PACU - hemodynamically stable.

## 2022-03-24 NOTE — Anesthesia Procedure Notes (Signed)
Anesthesia Regional Block: Supraclavicular block   Pre-Anesthetic Checklist: , timeout performed,  Correct Patient, Correct Site, Correct Laterality,  Correct Procedure,, risks and benefits discussed,  Surgical consent,  Pre-op evaluation,  At surgeon's request and post-op pain management  Laterality: Right  Prep: chloraprep       Needles:  Injection technique: Single-shot  Needle Type: Stimiplex          Additional Needles:   Procedures:,,,, ultrasound used (permanent image in chart),,   Motor weakness within 20 minutes.  Narrative:  Start time: 03/24/2022 10:44 AM End time: 03/24/2022 10:46 AM Injection made incrementally with aspirations every 5 mL.  Performed by: Personally  Anesthesiologist: Darrin Nipper, MD  Additional Notes: Functioning IV was confirmed and monitors applied.  Sterile prep and drape, hand hygiene and sterile gloves were used. Ultrasound guidance: relevant anatomy identified, needle position confirmed, local anesthetic spread visualized around nerve(s), vascular puncture avoided.  Image saved to electronic medical record.  Negative aspiration prior to incremental administration of local anesthetic for total 20 ml bupivacaine 0.5% given in supraclavicular distribution. The patient tolerated the procedure well. Vital signs and moderate sedation medications recorded in RN notes.

## 2022-03-24 NOTE — Discharge Instructions (Addendum)
Keep arm elevated is much as possible Work on finger motion is much as you can especially bending the knuckles Ice to the back of the hand and wrist today and tomorrow Pain medicine as directed Call office if having problems Keep dressing clean and dry  AMBULATORY SURGERY  DISCHARGE INSTRUCTIONS   The drugs that you were given will stay in your system until tomorrow so for the next 24 hours you should not:  Drive an automobile Make any legal decisions Drink any alcoholic beverage   You may resume regular meals tomorrow.  Today it is better to start with liquids and gradually work up to solid foods.  You may eat anything you prefer, but it is better to start with liquids, then soup and crackers, and gradually work up to solid foods.   Please notify your doctor immediately if you have any unusual bleeding, trouble breathing, redness and pain at the surgery site, drainage, fever, or pain not relieved by medication.    Additional Instructions:        Please contact your physician with any problems or Same Day Surgery at [phone], Monday through Friday 6 am to 4 pm, or [place] at [facility] number at [phone].

## 2022-03-26 NOTE — Anesthesia Postprocedure Evaluation (Signed)
Anesthesia Post Note  Patient: JINNIFER MONTEJANO  Procedure(s) Performed: ORIF right distal radius fracture (Right: Wrist)  Patient location during evaluation: PACU Anesthesia Type: Regional Level of consciousness: awake and alert Pain management: pain level controlled Vital Signs Assessment: post-procedure vital signs reviewed and stable Respiratory status: spontaneous breathing, nonlabored ventilation, respiratory function stable and patient connected to nasal cannula oxygen Cardiovascular status: blood pressure returned to baseline and stable Postop Assessment: no apparent nausea or vomiting Anesthetic complications: no   No notable events documented.   Last Vitals:  Vitals:   03/24/22 1330 03/24/22 1342  BP: 112/75 124/67  Pulse: 75 79  Resp: 13 15  Temp: 36.4 C (!) 36.3 C  SpO2: 95% 95%    Last Pain:  Vitals:   03/24/22 1342  TempSrc: Oral  PainSc: 0-No pain                 Molli Barrows

## 2022-03-27 DIAGNOSIS — S52501A Unspecified fracture of the lower end of right radius, initial encounter for closed fracture: Secondary | ICD-10-CM | POA: Diagnosis not present

## 2022-04-07 DIAGNOSIS — Z8781 Personal history of (healed) traumatic fracture: Secondary | ICD-10-CM | POA: Diagnosis not present

## 2022-04-07 DIAGNOSIS — Z9889 Other specified postprocedural states: Secondary | ICD-10-CM | POA: Diagnosis not present

## 2022-04-10 DIAGNOSIS — G4733 Obstructive sleep apnea (adult) (pediatric): Secondary | ICD-10-CM | POA: Diagnosis not present

## 2022-04-28 DIAGNOSIS — S52501D Unspecified fracture of the lower end of right radius, subsequent encounter for closed fracture with routine healing: Secondary | ICD-10-CM | POA: Diagnosis not present

## 2022-04-28 DIAGNOSIS — G4733 Obstructive sleep apnea (adult) (pediatric): Secondary | ICD-10-CM | POA: Diagnosis not present

## 2022-04-28 DIAGNOSIS — Z9989 Dependence on other enabling machines and devices: Secondary | ICD-10-CM | POA: Diagnosis not present

## 2022-05-06 DIAGNOSIS — G4733 Obstructive sleep apnea (adult) (pediatric): Secondary | ICD-10-CM | POA: Diagnosis not present

## 2022-05-11 ENCOUNTER — Other Ambulatory Visit: Payer: Self-pay | Admitting: Family Medicine

## 2022-05-11 DIAGNOSIS — I10 Essential (primary) hypertension: Secondary | ICD-10-CM

## 2022-05-11 DIAGNOSIS — G4733 Obstructive sleep apnea (adult) (pediatric): Secondary | ICD-10-CM | POA: Diagnosis not present

## 2022-05-11 NOTE — Telephone Encounter (Signed)
Called pt - [facility] for appointment. 

## 2022-05-11 NOTE — Telephone Encounter (Signed)
Requested medications are due for refill today.  yes  Requested medications are on the active medications list.  Yes  Last refill. 05/05/2021 #90 3 refills   Future visit scheduled.   no  Notes to clinic.  Pt is more than 3 months over due for OV.    Requested Prescriptions  Pending Prescriptions Disp Refills   hydrochlorothiazide (HYDRODIURIL) 25 MG tablet [Pharmacy Med Name: HYDROCHLOROTHIAZIDE TABS '25MG'$ ] 90 tablet 3    Sig: TAKE 1 TABLET DAILY (DISCONTINUE AMLODIPINE SWITCH TO HYDROCHLOROTHIAZIDE 25 MG)     Cardiovascular: Diuretics - Thiazide Failed - 05/11/2022  1:25 AM      Failed - Cr in normal range and within 180 days    Creat  Date Value Ref Range Status  06/17/2021 0.92 0.50 - 1.05 mg/dL Final         Failed - K in normal range and within 180 days    Potassium  Date Value Ref Range Status  06/17/2021 3.7 3.5 - 5.3 mmol/L Final         Failed - Na in normal range and within 180 days    Sodium  Date Value Ref Range Status  06/17/2021 140 135 - 146 mmol/L Final         Failed - Valid encounter within last 6 months    Recent Outpatient Visits           10 months ago Annual physical exam   [facility] Olin Hauser, DO   1 year ago Mixed hyperlipidemia   [place], Devonne Doughty, DO   1 year ago Essential hypertension   [place], Devonne Doughty, DO   1 year ago History of cerebrovascular accident (CVA) with residual deficit   [place], DO   2 years ago Pain and swelling of left knee   [place], DO       Future Appointments             In 3 months Brendolyn Patty, MD [place] BP in normal range    BP Readings from Last 1 Encounters:  03/24/22 124/67

## 2022-05-14 ENCOUNTER — Telehealth: Payer: Self-pay | Admitting: *Deleted

## 2022-05-14 NOTE — Chronic Care Management (AMB) (Signed)
  Care Coordination   Note   05/14/2022 Name: Sherry Oliver MRN: [id number] DOB: June 28, 1961  Sherry Oliver is a 61 y.o. year old female who sees Olin Hauser, DO for primary care. I reached out to Jarold Song by phone today to offer care coordination services.  [name] was given information about Care Coordination services today including:   The Care Coordination services include support from the care team which includes your Nurse Coordinator, Clinical Social Worker, or Pharmacist.  The Care Coordination team is here to help remove barriers to the health concerns and goals most important to you. Care Coordination services are voluntary, and the patient may decline or stop services at any time by request to their care team member.   Care Coordination Consent Status: Patient agreed to services and verbal consent obtained.   Follow up plan:  Telephone appointment with care coordination team member scheduled for:  05/25/22  Encounter Outcome:  Pt. Scheduled  [place]  Direct Dial: [phone]

## 2022-05-15 ENCOUNTER — Other Ambulatory Visit: Payer: Self-pay | Admitting: Family Medicine

## 2022-05-15 DIAGNOSIS — E782 Mixed hyperlipidemia: Secondary | ICD-10-CM

## 2022-05-15 DIAGNOSIS — I6930 Unspecified sequelae of cerebral infarction: Secondary | ICD-10-CM

## 2022-05-15 NOTE — Telephone Encounter (Signed)
Requested Prescriptions  Pending Prescriptions Disp Refills  . pravastatin (PRAVACHOL) 20 MG tablet [Pharmacy Med Name: PRAVASTATIN TABS '20MG'$ ] 90 tablet 3    Sig: TAKE 1 TABLET DAILY     Cardiovascular:  Antilipid - Statins Failed - 05/15/2022 12:42 AM      Failed - Lipid Panel in normal range within the last 12 months    Cholesterol  Date Value Ref Range Status  06/17/2021 244 (H) <200 mg/dL Final   LDL Cholesterol (Calc)  Date Value Ref Range Status  06/17/2021 147 (H) mg/dL (calc) Final    Comment:    Reference range: <100 . Desirable range <100 mg/dL for primary prevention;   <70 mg/dL for patients with CHD or diabetic patients  with > or = 2 CHD risk factors. Marland Kitchen LDL-C is now calculated using the Martin-Hopkins  calculation, which is a validated novel method providing  better accuracy than the Friedewald equation in the  estimation of LDL-C.  Cresenciano Genre et al. Annamaria Helling. 8882;800(34): 2061-2068  (http://education.[facility].com/faq/FAQ164)    HDL  Date Value Ref Range Status  06/17/2021 49 (L) > OR = 50 mg/dL Final   Triglycerides  Date Value Ref Range Status  06/17/2021 291 (H) <150 mg/dL Final    Comment:    . If a non-fasting specimen was collected, consider repeat triglyceride testing on a fasting specimen if clinically indicated.  Yates Decamp et al. J. of Clin. Lipidol. 9179;1:505-697. Marland Kitchen          Passed - Patient is not pregnant      Passed - Valid encounter within last 12 months    Recent Outpatient Visits          10 months ago Annual physical exam   [place], DO   1 year ago Mixed hyperlipidemia   [place], Devonne Doughty, DO   1 year ago Essential hypertension   [place], Devonne Doughty, DO   1 year ago History of cerebrovascular accident (CVA) with residual deficit   [place], DO   2 years ago Pain and  swelling of left knee   [place], DO      Future Appointments            In 3 weeks [place], Devonne Doughty, DO [facility], [place]   In 3 months Brendolyn Patty, MD [place]

## 2022-05-20 DIAGNOSIS — Z8781 Personal history of (healed) traumatic fracture: Secondary | ICD-10-CM | POA: Diagnosis not present

## 2022-05-20 DIAGNOSIS — Z9889 Other specified postprocedural states: Secondary | ICD-10-CM | POA: Diagnosis not present

## 2022-05-25 ENCOUNTER — Ambulatory Visit: Payer: Self-pay | Admitting: *Deleted

## 2022-05-25 NOTE — Patient Outreach (Signed)
  Care Coordination   Initial Visit Note   05/25/2022 Name: Sherry Oliver MRN: [id number] DOB: 1961/09/04  Sherry Oliver is a 61 y.o. year old female who sees Olin Hauser, DO for primary care. I spoke with  Jarold Song by phone today.  What matters to the patients health and wellness today?  Staying well to take care of my mom    Goals Addressed             This Visit's Progress    care coordination activities       Care Coordination Interventions: Care Management program reviewed with patient SDOH screening completed Patient discussed being the primary caregiver for her mother at this time, acknowloding that her caregiving responsibilities can be stressful at times Self care emphasized with patient being able to verbalize appropriate coping strategies to manage stress Patient identified no health concerns at this time, however did indicate that she will eventually need surgery on her hand and will need to make additional care plans for her mother-surgery has not been scheduled at this time Patient appreciated the call, however reports no care coordination needs at this time This social worker's contact information provided, patient encouraged to call in the event that care coordination needs arise Depression screen reviewed  Active listening / Reflection utilized  Emotional Support Provided Discussed self-care action plan: encouraged taking time for self care activities that she creates positive energy ie working in her flower bed, reading, taking care of her pets, watching a new TV show         SDOH assessments and interventions completed:  Yes  SDOH Interventions Today    Flowsheet Row Most Recent Value  SDOH Interventions   Food Insecurity Interventions Intervention Not Indicated  Housing Interventions Intervention Not Indicated  Transportation Interventions Intervention Not Indicated  Utilities Interventions Intervention Not Indicated         Care Coordination Interventions Activated:  Yes  Care Coordination Interventions:  Yes, provided   Follow up plan: No further intervention required.   Encounter Outcome:  Pt. Visit Completed

## 2022-05-25 NOTE — Patient Instructions (Signed)
Visit Information  Thank you for taking time to visit with me today. Please don't hesitate to contact me if I can be of assistance to you.   Following are the goals we discussed today:   Goals Addressed             This Visit's Progress    Staying well to take care of my mom       Care Coordination Interventions: Care Management program reviewed with patient SDOH screening completed Patient discussed being the primary caregiver for her mother at this time, acknowloding that her caregiving responsibilities can be stressful at times Self care emphasized with patient being able to verbalize appropriate coping strategies to manage stress Patient identified no health concerns at this time, however did indicate that she will eventually need surgery on her hand and will need to make additional care plans for her mother-surgery has not been scheduled at this time Patient appreciated the call, however reports no care coordination needs at this time This social worker's contact information provided, patient encouraged to call in the event that care coordination needs arise Depression screen reviewed  Active listening / Reflection utilized  Emotional Support Provided Discussed self-care action plan: encouraged taking time for self care activities that she creates positive energy ie working in her flower bed, reading, taking care of her pets, watching a new TV show           Please call the care guide team at [phone] if you need to cancel or schedule an appointment.   If you are experiencing a Mental Health or [place] or need someone to talk to, please call the Suicide and Crisis Lifeline: [phone]   Patient verbalizes understanding of instructions and care plan provided today and agrees to view in [place]. Active MyChart status and patient understanding of how to access instructions and care plan via MyChart confirmed with patient.     No further follow up required:  please do not hesitate to call with any additional questions or concerns [place], [place] Worker  [facility] Care Management [phone]

## 2022-06-05 ENCOUNTER — Other Ambulatory Visit: Payer: Self-pay | Admitting: Family Medicine

## 2022-06-05 ENCOUNTER — Ambulatory Visit: Payer: BC Managed Care – PPO | Admitting: Family Medicine

## 2022-06-05 ENCOUNTER — Encounter: Payer: Self-pay | Admitting: Family Medicine

## 2022-06-05 VITALS — BP 130/84 | HR 95 | Ht 62.0 in | Wt 154.4 lb

## 2022-06-05 DIAGNOSIS — G5601 Carpal tunnel syndrome, right upper limb: Secondary | ICD-10-CM | POA: Diagnosis not present

## 2022-06-05 DIAGNOSIS — R7309 Other abnormal glucose: Secondary | ICD-10-CM

## 2022-06-05 DIAGNOSIS — Z0000 Encounter for general adult medical examination without abnormal findings: Secondary | ICD-10-CM

## 2022-06-05 DIAGNOSIS — I10 Essential (primary) hypertension: Secondary | ICD-10-CM

## 2022-06-05 DIAGNOSIS — Z23 Encounter for immunization: Secondary | ICD-10-CM | POA: Diagnosis not present

## 2022-06-05 DIAGNOSIS — Z9889 Other specified postprocedural states: Secondary | ICD-10-CM | POA: Diagnosis not present

## 2022-06-05 DIAGNOSIS — M25531 Pain in right wrist: Secondary | ICD-10-CM

## 2022-06-05 DIAGNOSIS — M79672 Pain in left foot: Secondary | ICD-10-CM | POA: Diagnosis not present

## 2022-06-05 DIAGNOSIS — D2372 Other benign neoplasm of skin of left lower limb, including hip: Secondary | ICD-10-CM | POA: Diagnosis not present

## 2022-06-05 DIAGNOSIS — D2371 Other benign neoplasm of skin of right lower limb, including hip: Secondary | ICD-10-CM | POA: Diagnosis not present

## 2022-06-05 DIAGNOSIS — E782 Mixed hyperlipidemia: Secondary | ICD-10-CM

## 2022-06-05 DIAGNOSIS — Q828 Other specified congenital malformations of skin: Secondary | ICD-10-CM | POA: Diagnosis not present

## 2022-06-05 DIAGNOSIS — E039 Hypothyroidism, unspecified: Secondary | ICD-10-CM

## 2022-06-05 MED ORDER — TRAMADOL HCL 50 MG PO TABS: 50.0000 mg | ORAL_TABLET | Freq: Three times a day (TID) | ORAL | 0 refills | Status: AC | PRN

## 2022-06-05 NOTE — Progress Notes (Signed)
 Subjective:    Patient ID: Sherry Oliver, female    DOB: 01/21/1961, 61 y.o.   MRN: [id number]  Sherry Oliver is a 61 y.o. female presenting on 06/05/2022 for Hypothyroidism and Hypertension   HPI  CHRONIC HTN: Reports occasional home BP checks. Has intermittent elevation, admits stressors today. Current Meds - HCTZ 25mg daily, Amlodipine 5mg daily Lifestyle: - Diet: admits some sodium / caffeine in diet, not always hydrating - Exercise: not as active due to knee pain Denies CP, dyspnea, HA, edema, dizziness / lightheadedness   Hypothyroidism Chronic problem for >30 years. History of temperature instability and lab test showed problem. Last lab TSH and T4 normal. Currently taking Levothyroxine 88mcg daily and Cytomel 5mg daily, no change in dose and no symptoms.  Right Wrist Pain Carpal Tunnel, R S/p Fracture ORIF  R wrist fall injury, fractured wrist, she had it repaired and has developed carpal tunnel, and she admits difficulty with sleeping it keeps her awake. Followed by Dr Menz [facility], she recently contacted them and they have initiated the nerve conduction study, awaiting for 10/3 to get this done then Dr Menz may be able to do this in the office. - She admits poor grip on R arm and dealing with pain.      06/05/2022    2:38 PM 05/25/2022    9:13 AM 06/24/2021    3:05 PM  Depression screen PHQ 2/9  Decreased Interest 1 0 1  Down, Depressed, Hopeless 1 0 1  PHQ - 2 Score 2 0 2  Altered sleeping 2  2  Tired, decreased energy 2  2  Change in appetite 0  2  Feeling bad or failure about yourself  0  1  Trouble concentrating 1  2  Moving slowly or fidgety/restless 0  0  Suicidal thoughts 0  0  PHQ-9 Score 7  11  Difficult doing work/chores Somewhat difficult  Very difficult    Social History   Tobacco Use   Smoking status: Former    Types: Cigarettes    Quit date: 03/14/2018    Years since quitting: 4.2   Smokeless tobacco: Former   Vaping Use   Vaping Use: Never used  Substance Use Topics   Alcohol use: Yes    Comment: rare   Drug use: Not Currently    Review of Systems Per HPI unless specifically indicated above     Objective:    BP 130/84   Pulse 95   Ht 5' 2" (1.575 m)   Wt 154 lb 6.4 oz (70 kg)   SpO2 97%   BMI 28.24 kg/m   Wt Readings from Last 3 Encounters:  06/05/22 154 lb 6.4 oz (70 kg)  03/24/22 150 lb (68 kg)  03/22/22 150 lb (68 kg)    Physical Exam Vitals and nursing note reviewed.  Constitutional:      General: She is not in acute distress.    Appearance: Normal appearance. She is well-developed. She is not diaphoretic.     Comments: Well-appearing, comfortable, cooperative  HENT:     Head: Normocephalic and atraumatic.  Eyes:     General:        Right eye: No discharge.        Left eye: No discharge.     Conjunctiva/sclera: Conjunctivae normal.  Cardiovascular:     Rate and Rhythm: Normal rate.  Pulmonary:     Effort: Pulmonary effort is normal.  Skin:    General: Skin is   warm and dry.     Findings: No erythema or rash.  Neurological:     Mental Status: She is alert and oriented to person, place, and time.  Psychiatric:        Mood and Affect: Mood normal.        Behavior: Behavior normal.        Thought Content: Thought content normal.     Comments: Well groomed, good eye contact, normal speech and thoughts       Results for orders placed or performed in visit on 06/16/21  T4, free  Result Value Ref Range   Free T4 1.0 0.8 - 1.8 ng/dL  Hepatitis C antibody  Result Value Ref Range   Hepatitis C Ab NON-REACTIVE NON-REACTIVE   SIGNAL TO CUT-OFF 0.02 <1.00  TSH  Result Value Ref Range   TSH 2.76 0.40 - 4.50 mIU/L  Hemoglobin A1c  Result Value Ref Range   Hgb A1c MFr Bld 6.0 (H) <5.7 % of total Hgb   Mean Plasma Glucose 126 mg/dL   eAG (mmol/L) 7.0 mmol/L  CBC with Differential/Platelet  Result Value Ref Range   WBC 8.8 3.8 - 10.8 Thousand/uL   RBC 4.38  3.80 - 5.10 Million/uL   Hemoglobin 13.0 11.7 - 15.5 g/dL   HCT 40.6 35.0 - 45.0 %   MCV 92.7 80.0 - 100.0 fL   MCH 29.7 27.0 - 33.0 pg   MCHC 32.0 32.0 - 36.0 g/dL   RDW 12.8 11.0 - 15.0 %   Platelets 432 (H) 140 - 400 Thousand/uL   MPV 10.7 7.5 - 12.5 fL   Neutro Abs 5,852 1,500 - 7,800 cells/uL   Lymphs Abs 1,698 850 - 3,900 cells/uL   Absolute Monocytes 933 200 - 950 cells/uL   Eosinophils Absolute 238 15 - 500 cells/uL   Basophils Absolute 79 0 - 200 cells/uL   Neutrophils Relative % 66.5 %   Total Lymphocyte 19.3 %   Monocytes Relative 10.6 %   Eosinophils Relative 2.7 %   Basophils Relative 0.9 %  Lipid panel  Result Value Ref Range   Cholesterol 244 (H) <200 mg/dL   HDL 49 (L) > OR = 50 mg/dL   Triglycerides 291 (H) <150 mg/dL   LDL Cholesterol (Calc) 147 (H) mg/dL (calc)   Total CHOL/HDL Ratio 5.0 (H) <5.0 (calc)   Non-HDL Cholesterol (Calc) 195 (H) <130 mg/dL (calc)  COMPLETE METABOLIC PANEL WITH GFR  Result Value Ref Range   Glucose, Bld 94 65 - 99 mg/dL   BUN 18 7 - 25 mg/dL   Creat 0.92 0.50 - 1.05 mg/dL   eGFR 70 > OR = 60 mL/min/1.73m2   BUN/Creatinine Ratio NOT APPLICABLE 6 - 22 (calc)   Sodium 140 135 - 146 mmol/L   Potassium 3.7 3.5 - 5.3 mmol/L   Chloride 100 98 - 110 mmol/L   CO2 28 20 - 32 mmol/L   Calcium 9.9 8.6 - 10.4 mg/dL   Total Protein 7.9 6.1 - 8.1 g/dL   Albumin 4.6 3.6 - 5.1 g/dL   Globulin 3.3 1.9 - 3.7 g/dL (calc)   AG Ratio 1.4 1.0 - 2.5 (calc)   Total Bilirubin 0.4 0.2 - 1.2 mg/dL   Alkaline phosphatase (APISO) 68 37 - 153 U/L   AST 24 10 - 35 U/L   ALT 27 6 - 29 U/L      Assessment & Plan:   Problem List Items Addressed This Visit   None Visit Diagnoses       Right carpal tunnel syndrome    -  Primary   Relevant Medications   traMADol (ULTRAM) 50 MG tablet   Needs flu shot       Relevant Orders   Flu Vaccine QUAD 6+ mos PF IM (Fluarix Quad PF)   Right wrist pain       Relevant Medications   traMADol (ULTRAM) 50 MG tablet    S/P wrist surgery           R Wrist carpal tunnel S/p R wrist fracture ORIF surgery Followed by [facility] Dr Menz Upcoming nerve conduction study, diagnostics and future surgical release anticipated  Start the Tramadol as needed for the Carpal Tunnel. Severe pain can take 2 at once instead of 2-3 times a day. Short term pain relief plan, follow up with Dr Menz as planned for the nerve study and future surgery.  Using wrist splint    Meds ordered this encounter  Medications   traMADol (ULTRAM) 50 MG tablet    Sig: Take 1 tablet (50 mg total) by mouth every 8 (eight) hours as needed for up to 7 days.    Dispense:  21 tablet    Refill:  0      Follow up plan: Return in about 4 weeks (around 07/03/2022) for 4 weeks fasting lab then 1 week later Annual Physical.  Future labs ordered for 06/30/22   Alexander Karamalegos, DO [facility] 06/05/2022, 2:49 PM 

## 2022-06-05 NOTE — Patient Instructions (Addendum)
Thank you for coming to the office today.  Start the Tramadol as needed for the Carpal Tunnel. Severe pain can take 2 at once instead of 2-3 times a day. Short term pain relief plan, follow up with Dr Rudene Christians as planned for the nerve study and future surgery.  Flu shot today!  DUE for FASTING BLOOD WORK (no food or drink after midnight before the lab appointment, only water or coffee without cream/sugar on the morning of)  SCHEDULE "Lab Only" visit in the morning at the clinic for lab draw in 4 WEEKS   - Make sure Lab Only appointment is at about 1 week before your next appointment, so that results will be available  For Lab Results, once available within 2-3 days of blood draw, you can can log in to MyChart online to view your results and a brief explanation. Also, we can discuss results at next follow-up visit.   Please schedule a Follow-up Appointment to: Return in about 4 weeks (around 07/03/2022) for 4 weeks fasting lab then 1 week later Annual Physical.  If you have any other questions or concerns, please feel free to call the office or send a message through [place]. You may also schedule an earlier appointment if necessary.  Additionally, you may be receiving a survey about your experience at our office within a few days to 1 week by e-mail or mail. We value your feedback.  Nobie Putnam, DO [place]

## 2022-06-06 DIAGNOSIS — G4733 Obstructive sleep apnea (adult) (pediatric): Secondary | ICD-10-CM | POA: Diagnosis not present

## 2022-06-10 ENCOUNTER — Other Ambulatory Visit: Payer: Self-pay | Admitting: Podiatry

## 2022-06-10 DIAGNOSIS — M19072 Primary osteoarthritis, left ankle and foot: Secondary | ICD-10-CM

## 2022-06-11 DIAGNOSIS — G4733 Obstructive sleep apnea (adult) (pediatric): Secondary | ICD-10-CM | POA: Diagnosis not present

## 2022-06-15 DIAGNOSIS — G5601 Carpal tunnel syndrome, right upper limb: Secondary | ICD-10-CM | POA: Diagnosis not present

## 2022-06-19 ENCOUNTER — Ambulatory Visit
Admission: RE | Admit: 2022-06-19 | Discharge: 2022-06-19 | Disposition: A | Discharge status: Home / Self Care | Payer: BC Managed Care – PPO | Source: Ambulatory Visit | Attending: Podiatry | Admitting: Podiatry

## 2022-06-19 DIAGNOSIS — M19072 Primary osteoarthritis, left ankle and foot: Secondary | ICD-10-CM | POA: Diagnosis not present

## 2022-06-19 DIAGNOSIS — R600 Localized edema: Secondary | ICD-10-CM | POA: Diagnosis not present

## 2022-06-19 DIAGNOSIS — M2012 Hallux valgus (acquired), left foot: Secondary | ICD-10-CM | POA: Diagnosis not present

## 2022-06-22 DIAGNOSIS — G5601 Carpal tunnel syndrome, right upper limb: Secondary | ICD-10-CM | POA: Diagnosis not present

## 2022-06-29 ENCOUNTER — Other Ambulatory Visit: Payer: Self-pay

## 2022-06-29 DIAGNOSIS — I10 Essential (primary) hypertension: Secondary | ICD-10-CM

## 2022-06-29 DIAGNOSIS — E782 Mixed hyperlipidemia: Secondary | ICD-10-CM

## 2022-06-29 DIAGNOSIS — R7309 Other abnormal glucose: Secondary | ICD-10-CM

## 2022-06-29 DIAGNOSIS — Z0000 Encounter for general adult medical examination without abnormal findings: Secondary | ICD-10-CM

## 2022-06-29 DIAGNOSIS — E039 Hypothyroidism, unspecified: Secondary | ICD-10-CM

## 2022-06-30 ENCOUNTER — Other Ambulatory Visit: Payer: BC Managed Care – PPO

## 2022-06-30 DIAGNOSIS — I10 Essential (primary) hypertension: Secondary | ICD-10-CM | POA: Diagnosis not present

## 2022-06-30 DIAGNOSIS — R7309 Other abnormal glucose: Secondary | ICD-10-CM | POA: Diagnosis not present

## 2022-06-30 DIAGNOSIS — E782 Mixed hyperlipidemia: Secondary | ICD-10-CM | POA: Diagnosis not present

## 2022-06-30 DIAGNOSIS — E039 Hypothyroidism, unspecified: Secondary | ICD-10-CM | POA: Diagnosis not present

## 2022-07-01 LAB — CBC WITH DIFFERENTIAL/PLATELET
Absolute Monocytes: 746 cells/uL (ref 200–950)
Basophils Absolute: 53 cells/uL (ref 0–200)
Basophils Relative: 0.8 %
Eosinophils Absolute: 178 cells/uL (ref 15–500)
Eosinophils Relative: 2.7 %
HCT: 39.6 % (ref 35.0–45.0)
Hemoglobin: 13 g/dL (ref 11.7–15.5)
Lymphs Abs: 1564 cells/uL (ref 850–3900)
MCH: 29.7 pg (ref 27.0–33.0)
MCHC: 32.8 g/dL (ref 32.0–36.0)
MCV: 90.4 fL (ref 80.0–100.0)
MPV: 11.7 fL (ref 7.5–12.5)
Monocytes Relative: 11.3 %
Neutro Abs: 4059 cells/uL (ref 1500–7800)
Neutrophils Relative %: 61.5 %
Platelets: 380 10*3/uL (ref 140–400)
RBC: 4.38 10*6/uL (ref 3.80–5.10)
RDW: 12.9 % (ref 11.0–15.0)
Total Lymphocyte: 23.7 %
WBC: 6.6 10*3/uL (ref 3.8–10.8)

## 2022-07-01 LAB — COMPLETE METABOLIC PANEL WITH GFR
AG Ratio: 1.3 (calc) (ref 1.0–2.5)
ALT: 24 U/L (ref 6–29)
AST: 22 U/L (ref 10–35)
Albumin: 4.2 g/dL (ref 3.6–5.1)
Alkaline phosphatase (APISO): 65 U/L (ref 37–153)
BUN: 13 mg/dL (ref 7–25)
CO2: 28 mmol/L (ref 20–32)
Calcium: 9.7 mg/dL (ref 8.6–10.4)
Chloride: 103 mmol/L (ref 98–110)
Creat: 0.83 mg/dL (ref 0.50–1.05)
Globulin: 3.2 g/dL (calc) (ref 1.9–3.7)
Glucose, Bld: 97 mg/dL (ref 65–99)
Potassium: 3.7 mmol/L (ref 3.5–5.3)
Sodium: 141 mmol/L (ref 135–146)
Total Bilirubin: 0.4 mg/dL (ref 0.2–1.2)
Total Protein: 7.4 g/dL (ref 6.1–8.1)
eGFR: 79 mL/min/{1.73_m2} (ref >=60)

## 2022-07-01 LAB — HEMOGLOBIN A1C
Hgb A1c MFr Bld: 6.1 % of total Hgb — ABNORMAL HIGH (ref <5.7)
Mean Plasma Glucose: 128 mg/dL
eAG (mmol/L): 7.1 mmol/L

## 2022-07-01 LAB — LIPID PANEL
Cholesterol: 217 mg/dL — ABNORMAL HIGH (ref <200)
HDL: 50 mg/dL (ref >=50)
LDL Cholesterol (Calc): 129 mg/dL (calc) — ABNORMAL HIGH
Non-HDL Cholesterol (Calc): 167 mg/dL (calc) — ABNORMAL HIGH (ref <130)
Total CHOL/HDL Ratio: 4.3 (calc) (ref <5.0)
Triglycerides: 237 mg/dL — ABNORMAL HIGH (ref <150)

## 2022-07-01 LAB — TSH: TSH: 3.29 mIU/L (ref 0.40–4.50)

## 2022-07-01 LAB — T4, FREE: Free T4: 1 ng/dL (ref 0.8–1.8)

## 2022-07-06 DIAGNOSIS — G4733 Obstructive sleep apnea (adult) (pediatric): Secondary | ICD-10-CM | POA: Diagnosis not present

## 2022-07-07 ENCOUNTER — Other Ambulatory Visit: Payer: Self-pay | Admitting: Family Medicine

## 2022-07-07 ENCOUNTER — Encounter: Payer: Self-pay | Admitting: Family Medicine

## 2022-07-07 ENCOUNTER — Ambulatory Visit (INDEPENDENT_AMBULATORY_CARE_PROVIDER_SITE_OTHER): Payer: BC Managed Care – PPO | Admitting: Family Medicine

## 2022-07-07 VITALS — BP 133/81 | HR 102 | Ht 62.0 in | Wt 157.0 lb

## 2022-07-07 DIAGNOSIS — I6930 Unspecified sequelae of cerebral infarction: Secondary | ICD-10-CM

## 2022-07-07 DIAGNOSIS — I10 Essential (primary) hypertension: Secondary | ICD-10-CM

## 2022-07-07 DIAGNOSIS — R7309 Other abnormal glucose: Secondary | ICD-10-CM

## 2022-07-07 DIAGNOSIS — E039 Hypothyroidism, unspecified: Secondary | ICD-10-CM | POA: Diagnosis not present

## 2022-07-07 DIAGNOSIS — E782 Mixed hyperlipidemia: Secondary | ICD-10-CM

## 2022-07-07 DIAGNOSIS — J432 Centrilobular emphysema: Secondary | ICD-10-CM | POA: Diagnosis not present

## 2022-07-07 DIAGNOSIS — Z0000 Encounter for general adult medical examination without abnormal findings: Secondary | ICD-10-CM | POA: Diagnosis not present

## 2022-07-07 DIAGNOSIS — G4733 Obstructive sleep apnea (adult) (pediatric): Secondary | ICD-10-CM

## 2022-07-07 DIAGNOSIS — Z1231 Encounter for screening mammogram for malignant neoplasm of breast: Secondary | ICD-10-CM

## 2022-07-07 MED ORDER — PRAVASTATIN SODIUM 20 MG PO TABS: 20.0000 mg | ORAL_TABLET | Freq: Every day | ORAL | 3 refills | Status: DC

## 2022-07-07 MED ORDER — LIOTHYRONINE SODIUM 5 MCG PO TABS: 5.0000 ug | ORAL_TABLET | Freq: Every day | ORAL | 3 refills | Status: DC

## 2022-07-07 MED ORDER — LEVOTHYROXINE SODIUM 88 MCG PO TABS: 88.0000 ug | ORAL_TABLET | Freq: Every day | ORAL | 3 refills | Status: DC

## 2022-07-07 NOTE — Assessment & Plan Note (Signed)
Followed by [facility] Pulm Controlled without flare up

## 2022-07-07 NOTE — Patient Instructions (Addendum)
Thank you for coming to the office today.  Keep on HCTZ medication for BP Remain off Amlodipine  Refilled thyroid meds x 2 and Pravastatin for cholesterol.  For Mammogram screening for breast cancer   Call the [place] below anytime to schedule your own appointment now that order has been placed.  [facility] at [place], [address] [place], [place] [address] Phone: [phone]  DUE for FASTING BLOOD WORK (no food or drink after midnight before the lab appointment, only water or coffee without cream/sugar on the morning of)  SCHEDULE "Lab Only" visit in the morning at the clinic for lab draw in 6 MONTHS   - Make sure Lab Only appointment is at about 1 week before your next appointment, so that results will be available  For Lab Results, once available within 2-3 days of blood draw, you can can log in to MyChart online to view your results and a brief explanation. Also, we can discuss results at next follow-up visit.    Please schedule a Follow-up Appointment to: Return in about 6 months (around 01/06/2023) for 6 month fasting lab only then 1 week later Follow-up Thyroid, Cholesterol, Sugar, updates.  If you have any other questions or concerns, please feel free to call the office or send a message through [place]. You may also schedule an earlier appointment if necessary.  Additionally, you may be receiving a survey about your experience at our office within a few days to 1 week by e-mail or mail. We value your feedback.  Nobie Putnam, DO [place]

## 2022-07-07 NOTE — Assessment & Plan Note (Signed)
Controlled - Home BP readings   Complication w/ CVA history  Off Amlodipine due to swelling  Plan:  1. Continue current BP regimen HCTZ '25mg'$  daily - chemistry reviewed, normal K Cr and Na. 2. Encourage improved lifestyle - low sodium diet, regular exercise 3. Continue monitor BP outside office, bring readings to next visit, if persistently >140/90 or new symptoms notify office sooner

## 2022-07-07 NOTE — Assessment & Plan Note (Signed)
A1c mild up to 6.1 Stable overall Improve lifestyle

## 2022-07-07 NOTE — Progress Notes (Signed)
Subjective:    Patient ID: Sherry Oliver, female    DOB: 1961/02/13, 61 y.o.   MRN: [id number]  Sherry Oliver is a 61 y.o. female presenting on 07/07/2022 for Annual Exam   HPI  Here for Annual Physical and Lab Review  CHRONIC HTN: Reports occasional home BP checks. Has intermittent elevation, admits stressors Current Meds - HCTZ 69m daily - Off Amlodipine due to swelling Lifestyle: - Diet: admits some sodium / caffeine in diet, not always hydrating - Exercise: not as active due to knee pain Denies CP, dyspnea, HA, edema, dizziness / lightheadedness   Hypothyroidism Chronic problem for >30 years. History of temperature instability and lab test showed problem. Last lab TSH and T4 normal. Currently taking Levothyroxine 834m daily and Cytomel 53m53maily, no change in dose and no symptoms.   Right Wrist Pain Carpal Tunnel, R S/p Fracture ORIF Background R wrist fall injury, fractured wrist, she had it repaired and has developed carpal tunnel, and she admits difficulty with sleeping it keeps her awake. Followed by Dr [facility]arl[facility] S/p carpal tunnel release now Secondary carpal tunnel > led to release surgery, improved mobility but still lacking grip strength, numbness weakness.    HYPERLIPIDEMIA: - Reports history issues of high cholesterol and failed statins due to myalgia. [facility] they ordered a CT Cardiac score test Lipids Improved on Pravastatin now with no myalgia Pravastatin 16m953mily Failed Rosuvastatin (crestor), Pitavstatin, Atorvastatin (lipitor), Simvastatin (zocor)  Pre-Diabetes A1c 6.1 Meds: none Not on ACEi ARB - Diet admits poor diet lately still but goal to improve - Exercise (Limited by by exercise due to knee pain) Denies hypoglycemia, polyuria, visual changes, numbness or tingling   GERD Taking Nexium 16mg16m BID    Former Smoker   OSA on CPAP Pulm [facility] Dr AleskLanney Gins on new CPAP machine,  upgraded from 2011 Improved on CPAP, using nightly, benefits from therapy     Additional update Mother has been diagnosed with bladder cancer again and she will start chemo again and CindyJenny Reichmannhad some increased stress with this. She is primary caregiver   Health Maintenance:   Breast Cancer Screening: Left sided lymph node swollen under L arm axilla, then repeat mammogram had another lymph node. Her mother has BRCA 2 gene and had double mastectomy around age 38. S34 proceeded with lymph node biopsy, ultimately it was benign thought to be reactive. Followed w/ Mammo      Cervical Cancer Screening - Mother has BRCA 2 - hysterectomy found uterine cancer. Patient has had pap smears, last one 01/04/18 [facility], negative intraepithelial malignancy and negative High Risk HPV, good for up to 5 years. Next due in 2024   Colonoscopy: done 03/24/18, no polyps, repeat within 5 years, report reviewed.        06/05/2022    2:38 PM 05/25/2022    9:13 AM 06/24/2021    3:05 PM  Depression screen PHQ 2/9  Decreased Interest 1 0 1  Down, Depressed, Hopeless 1 0 1  PHQ - 2 Score 2 0 2  Altered sleeping 2  2  Tired, decreased energy 2  2  Change in appetite 0  2  Feeling bad or failure about yourself  0  1  Trouble concentrating 1  2  Moving slowly or fidgety/restless 0  0  Suicidal thoughts 0  0  PHQ-9 Score 7  11  Difficult doing work/chores Somewhat difficult  Very difficult  Past Medical History:  Diagnosis Date   Allergy    Gastropathy    GERD (gastroesophageal reflux disease)    Headache    Hyperlipidemia    Hypertension    Hypothyroid    Lumbar radiculopathy    Lung nodule    Sleep apnea    Past Surgical History:  Procedure Laterality Date   APPENDECTOMY  2008   BLADDER SURGERY  1965   bladder stem   BREAST BIOPSY Left 06/06/2019   Lymph node US Bx, hydromarker. REACTIVE LYMPH NODE, FAVOR BENIGN   COLONOSCOPY WITH PROPOFOL N/A 02/19/2015   Procedure: COLONOSCOPY WITH  PROPOFOL;  Surgeon: Lollie Sails, MD;  Location[facility] ENDOSCOPY;  Service: Endoscopy;  Laterality: N/A;   COLONOSCOPY WITH PROPOFOL N/A 03/24/2018   Procedure: COLONOSCOPY WITH PROPOFOL;  Surgeon: Lollie Sails, MD;  Location: [facility] ENDOSCOPY;  Service: Endoscopy;  Laterality: N/A;   ELBOW SURGERY Right 1982   ESOPHAGOGASTRODUODENOSCOPY     FOOT SURGERY Left 2007   neuroma removal, repeat 2008   OPEN REDUCTION INTERNAL FIXATION (ORIF) DISTAL RADIAL FRACTURE Right 03/24/2022   Procedure: ORIF right distal radius fracture;  Surgeon: Hessie Knows, MD;  Location: [facility] ORS;  Service: Orthopedics;  Laterality: Right;   Social History   Socioeconomic History   Marital status: Single    Spouse name: Not on file   Number of children: Not on file   Years of education: Not on file   Highest education level: Not on file  Occupational History   Not on file  Tobacco Use   Smoking status: Former    Types: Cigarettes    Quit date: 03/14/2018    Years since quitting: 4.3   Smokeless tobacco: Former  Scientific laboratory technician Use: Never used  Substance and Sexual Activity   Alcohol use: Yes    Comment: rare   Drug use: Not Currently   Sexual activity: Not on file  Other Topics Concern   Not on file  Social History Narrative   Not on file   Social Determinants of Health   Financial Resource Strain: Not on file  Food Insecurity: No Food Insecurity (05/25/2022)   Hunger Vital Sign    Worried About Running Out of Food in the Last Year: Never true    Ran Out of Food in the Last Year: Never true  Transportation Needs: No Transportation Needs (05/25/2022)   PRAPARE - Hydrologist (Medical): No    Lack of Transportation (Non-Medical): No  Physical Activity: Not on file  Stress: Not on file  Social Connections: Moderately Isolated (05/25/2022)   Social Connection and Isolation Panel [NHANES]    Frequency of Communication with Friends and Family: Twice a week     Frequency of Social Gatherings with Friends and Family: Once a week    Attends Religious Services: Never    Marine scientist or Organizations: Yes    Attends Music therapist: 1 to 4 times per year    Marital Status: Never married  Human resources officer Violence: Not on file   Family History  Problem Relation Age of Onset   Breast cancer Mother 65   BRCA 1/2 Mother        positive for Brca 2   Cancer Mother        bladder   Breast cancer Maternal Aunt 58   Breast cancer Maternal Grandfather        70's   Alcohol abuse Father  Lung cancer Father 70       mets to brain   Heart disease Maternal Grandmother    Stroke Paternal Grandmother    Current Outpatient Medications on File Prior to Visit  Medication Sig   Ascorbic Acid (VITAMIN C) 1000 MG tablet Take 1,000 mg by mouth daily.   aspirin 81 MG chewable tablet Chew 1 tablet (81 mg total) by mouth daily.   b complex vitamins tablet Take 1 tablet by mouth daily.   Calcium Carbonate-Vitamin D 600-400 MG-UNIT per tablet Take 1 tablet by mouth daily.   clotrimazole-betamethasone (LOTRISONE) cream Apply topically 2 (two) times daily.   desoximetasone (TOPICORT) 0.25 % cream Apply to affected areas one to two times daily as needed until rash improved. Avoid applying to face, groin, and axilla.   Dupilumab (DUPIXENT) 300 MG/2ML SOPN INJECT 300 MG UNDER THE SKIN EVERY 14 DAYS   esomeprazole (NEXIUM) 20 MG capsule Take 20 mg by mouth in the morning and at bedtime.   fluticasone (FLONASE) 50 MCG/ACT nasal spray Place 2 sprays into both nostrils daily.   Fluticasone-Umeclidin-Vilant (TRELEGY ELLIPTA) 100-62.5-25 MCG/INH AEPB Inhale into the lungs.   hydrochlorothiazide (HYDRODIURIL) 25 MG tablet Take 1 tablet (25 mg total) by mouth daily.   ibuprofen (ADVIL,MOTRIN) 200 MG tablet Take 400 mg by mouth 2 (two) times daily.   montelukast (SINGULAIR) 10 MG tablet Take 10 mg by mouth daily.   Multiple Vitamin (MULTIVITAMIN WITH  MINERALS) TABS tablet Take 1 tablet by mouth daily.   pramipexole (MIRAPEX) 0.75 MG tablet Take 1 tablet (0.75 mg total) by mouth at bedtime.   tacrolimus (PROTOPIC) 0.1 % ointment Apply topically daily.   diclofenac Sodium (VOLTAREN) 1 % GEL Apply 2 g topically 3 (three) times daily as needed. (Patient not taking: Reported on 12/02/2020)   No current facility-administered medications on file prior to visit.    Review of Systems  Constitutional:  Negative for activity change, appetite change, chills, diaphoresis, fatigue and fever.  HENT:  Negative for congestion and hearing loss.   Eyes:  Negative for visual disturbance.  Respiratory:  Negative for cough, chest tightness, shortness of breath and wheezing.   Cardiovascular:  Negative for chest pain, palpitations and leg swelling.  Gastrointestinal:  Negative for abdominal pain, constipation, diarrhea, nausea and vomiting.  Genitourinary:  Negative for dysuria, frequency and hematuria.  Musculoskeletal:  Negative for arthralgias and neck pain.  Skin:  Negative for rash.  Neurological:  Negative for dizziness, weakness, light-headedness, numbness and headaches.  Hematological:  Negative for adenopathy.  Psychiatric/Behavioral:  Negative for behavioral problems, dysphoric mood and sleep disturbance.    Per HPI unless specifically indicated above      Objective:    BP 133/81   Pulse (!) 102   Ht _0  (1.575 m)   Wt 157 lb (71.2 kg)   SpO2 99%   BMI 28.72 kg/m   Wt Readings from Last 3 Encounters:  07/07/22 157 lb (71.2 kg)  06/05/22 154 lb 6.4 oz (70 kg)  03/24/22 150 lb (68 kg)    Physical Exam Vitals and nursing note reviewed.  Constitutional:      General: She is not in acute distress.    Appearance: She is well-developed. She is not diaphoretic.     Comments: Well-appearing, comfortable, cooperative  HENT:     Head: Normocephalic and atraumatic.  Eyes:     General:        Right eye: No discharge.  Left eye: No  discharge.     Conjunctiva/sclera: Conjunctivae normal.     Pupils: Pupils are equal, round, and reactive to light.  Neck:     Thyroid: No thyromegaly.  Cardiovascular:     Rate and Rhythm: Normal rate and regular rhythm.     Pulses: Normal pulses.     Heart sounds: Normal heart sounds. No murmur heard. Pulmonary:     Effort: Pulmonary effort is normal. No respiratory distress.     Breath sounds: Normal breath sounds. No wheezing or rales.  Abdominal:     General: Bowel sounds are normal. There is no distension.     Palpations: Abdomen is soft. There is no mass.     Tenderness: There is no abdominal tenderness.  Musculoskeletal:        General: No tenderness. Normal range of motion.     Cervical back: Normal range of motion and neck supple.     Comments: Upper / Lower Extremities: - Normal muscle tone, strength bilateral upper extremities 5/5, lower extremities 5/5  Lymphadenopathy:     Cervical: No cervical adenopathy.  Skin:    General: Skin is warm and dry.     Findings: No erythema or rash.  Neurological:     Mental Status: She is alert and oriented to person, place, and time.     Comments: Distal sensation intact to light touch all extremities  Psychiatric:        Mood and Affect: Mood normal.        Behavior: Behavior normal.        Thought Content: Thought content normal.     Comments: Well groomed, good eye contact, normal speech and thoughts      Results for orders placed or performed in visit on 06/29/22  T4, free  Result Value Ref Range   Free T4 1.0 0.8 - 1.8 ng/dL  TSH  Result Value Ref Range   TSH 3.29 0.40 - 4.50 mIU/L  Hemoglobin A1c  Result Value Ref Range   Hgb A1c MFr Bld 6.1 (H) <5.7 % of total Hgb   Mean Plasma Glucose 128 mg/dL   eAG (mmol/L) 7.1 mmol/L  Lipid panel  Result Value Ref Range   Cholesterol 217 (H) <200 mg/dL   HDL 50 > OR = 50 mg/dL   Triglycerides 237 (H) <150 mg/dL   LDL Cholesterol (Calc) 129 (H) mg/dL (calc)   Total  CHOL/HDL Ratio 4.3 <5.0 (calc)   Non-HDL Cholesterol (Calc) 167 (H) <130 mg/dL (calc)  CBC with Differential/Platelet  Result Value Ref Range   WBC 6.6 3.8 - 10.8 Thousand/uL   RBC 4.38 3.80 - 5.10 Million/uL   Hemoglobin 13.0 11.7 - 15.5 g/dL   HCT 39.6 35.0 - 45.0 %   MCV 90.4 80.0 - 100.0 fL   MCH 29.7 27.0 - 33.0 pg   MCHC 32.8 32.0 - 36.0 g/dL   RDW 12.9 11.0 - 15.0 %   Platelets 380 140 - 400 Thousand/uL   MPV 11.7 7.5 - 12.5 fL   Neutro Abs 4,059 1,500 - 7,800 cells/uL   Lymphs Abs 1,564 850 - 3,900 cells/uL   Absolute Monocytes 746 200 - 950 cells/uL   Eosinophils Absolute 178 15 - 500 cells/uL   Basophils Absolute 53 0 - 200 cells/uL   Neutrophils Relative % 61.5 %   Total Lymphocyte 23.7 %   Monocytes Relative 11.3 %   Eosinophils Relative 2.7 %   Basophils Relative 0.8 %  COMPLETE METABOLIC PANEL  WITH GFR  Result Value Ref Range   Glucose, Bld 97 65 - 99 mg/dL   BUN 13 7 - 25 mg/dL   Creat 0.83 0.50 - 1.05 mg/dL   eGFR 79 > OR = 60 mL/min/1.29m   BUN/Creatinine Ratio SEE NOTE: 6 - 22 (calc)   Sodium 141 135 - 146 mmol/L   Potassium 3.7 3.5 - 5.3 mmol/L   Chloride 103 98 - 110 mmol/L   CO2 28 20 - 32 mmol/L   Calcium 9.7 8.6 - 10.4 mg/dL   Total Protein 7.4 6.1 - 8.1 g/dL   Albumin 4.2 3.6 - 5.1 g/dL   Globulin 3.2 1.9 - 3.7 g/dL (calc)   AG Ratio 1.3 1.0 - 2.5 (calc)   Total Bilirubin 0.4 0.2 - 1.2 mg/dL   Alkaline phosphatase (APISO) 65 37 - 153 U/L   AST 22 10 - 35 U/L   ALT 24 6 - 29 U/L      Assessment & Plan:   Problem List Items Addressed This Visit     Acquired hypothyroidism    Controlled on current regimen Last lab TSH T4 Normal Currently taking Levothyroxine 8649m daily and Cytomel 49m72maily      Relevant Medications   liothyronine (CYTOMEL) 5 MCG tablet   levothyroxine (SYNTHROID) 88 MCG tablet   Centrilobular emphysema (HCC)    Followed by [facility] Controlled without flare up      Elevated hemoglobin A1c    A1c mild up to  6.1 Stable overall Improve lifestyle      Essential hypertension    Controlled - Home BP readings   Complication w/ CVA history  Off Amlodipine due to swelling  Plan:  1. Continue current BP regimen HCTZ 249m43mily - chemistry reviewed, normal K Cr and Na. 2. Encourage improved lifestyle - low sodium diet, regular exercise 3. Continue monitor BP outside office, bring readings to next visit, if persistently >140/90 or new symptoms notify office sooner      Relevant Medications   pravastatin (PRAVACHOL) 20 MG tablet   History of cerebrovascular accident (CVA) with residual deficit   Relevant Medications   pravastatin (PRAVACHOL) 20 MG tablet   Mixed hyperlipidemia    Improved LDL control on statin Tolerating Pravastatin 20mg20me daily  Prior failed statins - Simvastatin, Atorvastatin, Rosuvastatin, Pitavastatin History of CVA, on statin for secondary risk reduction  Future reconsider Nexlizent vs PCSK9      Relevant Medications   pravastatin (PRAVACHOL) 20 MG tablet   OSA on CPAP    Now followed [facility] [place]  Chronic OSA on CPAP - Good adherence to CPAP nightly - Continue current CPAP therapy, patient seems to be benefiting from therapy       Other Visit Diagnoses     Annual physical exam    -  Primary   Encounter for screening mammogram for malignant neoplasm of breast       Relevant Orders   MM 3D SCREEN BREAST BILATERAL       Updated Health Maintenance information Reviewed recent lab results with patient Encouraged improvement to lifestyle with diet and exercise Goal of weight loss   Meds ordered this encounter  Medications   pravastatin (PRAVACHOL) 20 MG tablet    Sig: Take 1 tablet (20 mg total) by mouth daily.    Dispense:  90 tablet    Refill:  3    Future refills added for 1 year   liothyronine (CYTOMEL) 5 MCG tablet  Sig: Take 1 tablet (5 mcg total) by mouth daily.    Dispense:  90 tablet    Refill:  3    Add future refills    levothyroxine (SYNTHROID) 88 MCG tablet    Sig: Take 1 tablet (88 mcg total) by mouth daily before breakfast.    Dispense:  90 tablet    Refill:  3    Add future refills      Follow up plan: Return in about 6 months (around 01/06/2023) for 6 month fasting lab only then 1 week later Follow-up Thyroid, Cholesterol, Sugar, updates.  Future labs ordered 12/2022 TSH T4 Lipid A1c  Nobie Putnam, DO [facility] 07/07/2022, 3:36 PM

## 2022-07-07 NOTE — Assessment & Plan Note (Signed)
Improved LDL control on statin Tolerating Pravastatin '20mg'$  dose daily  Prior failed statins - Simvastatin, Atorvastatin, Rosuvastatin, Pitavastatin History of CVA, on statin for secondary risk reduction  Future reconsider Nexlizent vs PCSK9

## 2022-07-07 NOTE — Assessment & Plan Note (Signed)
Now followed by [place], on new CPAP  Chronic OSA on CPAP - Good adherence to CPAP nightly - Continue current CPAP therapy, patient seems to be benefiting from therapy

## 2022-07-07 NOTE — Assessment & Plan Note (Signed)
Controlled on current regimen Last lab TSH T4 Normal Currently taking Levothyroxine 59mg daily and Cytomel '5mg'$  daily

## 2022-07-13 ENCOUNTER — Encounter (INDEPENDENT_AMBULATORY_CARE_PROVIDER_SITE_OTHER): Payer: Self-pay

## 2022-07-20 DIAGNOSIS — Q828 Other specified congenital malformations of skin: Secondary | ICD-10-CM | POA: Diagnosis not present

## 2022-07-20 DIAGNOSIS — M79671 Pain in right foot: Secondary | ICD-10-CM | POA: Diagnosis not present

## 2022-07-20 DIAGNOSIS — D2371 Other benign neoplasm of skin of right lower limb, including hip: Secondary | ICD-10-CM | POA: Diagnosis not present

## 2022-07-20 DIAGNOSIS — D2372 Other benign neoplasm of skin of left lower limb, including hip: Secondary | ICD-10-CM | POA: Diagnosis not present

## 2022-08-03 DIAGNOSIS — Z9889 Other specified postprocedural states: Secondary | ICD-10-CM | POA: Diagnosis not present

## 2022-08-03 DIAGNOSIS — S52501D Unspecified fracture of the lower end of right radius, subsequent encounter for closed fracture with routine healing: Secondary | ICD-10-CM | POA: Diagnosis not present

## 2022-08-03 DIAGNOSIS — Z8781 Personal history of (healed) traumatic fracture: Secondary | ICD-10-CM | POA: Diagnosis not present

## 2022-08-19 ENCOUNTER — Ambulatory Visit: Payer: BC Managed Care – PPO | Admitting: Dermatology

## 2022-08-25 ENCOUNTER — Ambulatory Visit: Payer: BC Managed Care – PPO | Admitting: Dermatology

## 2022-09-15 ENCOUNTER — Ambulatory Visit: Payer: BC Managed Care – PPO | Admitting: Dermatology

## 2022-09-30 ENCOUNTER — Ambulatory Visit: Payer: BC Managed Care – PPO | Admitting: Dermatology

## 2022-09-30 VITALS — BP 142/93

## 2022-09-30 DIAGNOSIS — Z79899 Other long term (current) drug therapy: Secondary | ICD-10-CM | POA: Diagnosis not present

## 2022-09-30 DIAGNOSIS — D2371 Other benign neoplasm of skin of right lower limb, including hip: Secondary | ICD-10-CM | POA: Diagnosis not present

## 2022-09-30 DIAGNOSIS — L2089 Other atopic dermatitis: Secondary | ICD-10-CM

## 2022-09-30 DIAGNOSIS — Q828 Other specified congenital malformations of skin: Secondary | ICD-10-CM | POA: Diagnosis not present

## 2022-09-30 DIAGNOSIS — D2372 Other benign neoplasm of skin of left lower limb, including hip: Secondary | ICD-10-CM | POA: Diagnosis not present

## 2022-09-30 DIAGNOSIS — M79671 Pain in right foot: Secondary | ICD-10-CM | POA: Diagnosis not present

## 2022-09-30 MED ORDER — DUPIXENT 300 MG/2ML ~~LOC~~ SOAJ: SUBCUTANEOUS | 1 refills | Status: DC

## 2022-09-30 NOTE — Progress Notes (Signed)
   Follow-Up Visit   Subjective  Sherry Oliver is a 62 y.o. female who presents for the following: Eczema (6 month follow-up. Patient is much improved and controlled with Dupixent injections q 14 days, no side effects and no injection site reactions. She uses desoximetasone 0.25% cream and tacrolimus ointment, as needed.). Patient washes hands a lot. No eye symptoms.  The following portions of the chart were reviewed this encounter and updated as appropriate:       Review of Systems:  No other skin or systemic complaints except as noted in HPI or Assessment and Plan.  Objective  Well appearing patient in no apparent distress; mood and affect are within normal limits.  A focused examination was performed including face, arms, legs. Relevant physical exam findings are noted in the Assessment and Plan.  arms, hands, feet Mild erythema and small fissure on the right palm; hyperlinear skin markings on the palms; few small fissures on the fingertips and thumbs; nail dystrophy on the R 3rd fingernail at base    Assessment & Plan  Other atopic dermatitis arms, hands, feet  With multiple contact allergies (pt avoids environmental exposure to allergens, including propylene glycol) Chronic condition with duration or expected duration over one year. Currently well-controlled on Dupixent.    Atopic dermatitis - Severe, on Dupixent (biologic medication).  Atopic dermatitis (eczema) is a chronic, relapsing, pruritic condition that can significantly affect quality of life. It is often associated with allergic rhinitis and/or asthma and can require treatment with topical medications, phototherapy, or in severe cases a biologic medication called Dupixent, which requires long term medication management.     Dupilumab (Dupixent) is a treatment given by injection for adults and children with moderate-to-severe atopic dermatitis. Goal is control of skin condition, not cure. It is given as 2 injections  at the first dose followed by 1 injection ever 2 weeks thereafter.  Young children are dosed monthly.   Potential side effects include allergic reaction, herpes infections, injection site reactions and conjunctivitis (inflammation of the eyes).  The use of Dupixent requires long term medication management, including periodic office visits.   Continue tacrolimus 0.1% ointment nightly to affected areas arm and occlude.  Continue desoximetasone daily as needed for flares. Avoid applying to face, groin, and axilla. Use as directed. Long-term use can cause thinning of the skin. Continue Dupixent '300mg'$ /40m Q2wks   Topical steroids (such as triamcinolone, fluocinolone, fluocinonide, mometasone, clobetasol, halobetasol, betamethasone, hydrocortisone) can cause thinning and lightening of the skin if they are used for too long in the same area. Your physician has selected the right strength medicine for your problem and area affected on the body. Please use your medication only as directed by your physician to prevent side effects.   Related Medications tacrolimus (PROTOPIC) 0.1 % ointment Apply topically daily.  Dupilumab (DUPIXENT) 300 MG/2ML SOPN INJECT 300 MG UNDER THE SKIN EVERY 14 DAYS   Return in about 6 months (around 03/31/2023) for Atopic Dermatitis.  I,Jamesetta Orleans CMA, am acting as scribe for TBrendolyn Patty MD .  Documentation: I have reviewed the above documentation for accuracy and completeness, and I agree with the above.  TBrendolyn PattyMD

## 2022-09-30 NOTE — Patient Instructions (Addendum)
Dupilumab (Dupixent) is a treatment given by injection for adults and children with moderate-to-severe atopic dermatitis. Goal is control of skin condition, not cure. It is given as 2 injections at the first dose followed by 1 injection ever 2 weeks thereafter.  Young children are dosed monthly.  Potential side effects include allergic reaction, herpes infections, injection site reactions and conjunctivitis (inflammation of the eyes).  The use of Dupixent requires long term medication management, including periodic office visits.    Due to recent changes in healthcare laws, you may see results of your pathology and/or laboratory studies on MyChart before the doctors have had a chance to review them. We understand that in some cases there may be results that are confusing or concerning to you. Please understand that not all results are received at the same time and often the doctors may need to interpret multiple results in order to provide you with the best plan of care or course of treatment. Therefore, we ask that you please give us 2 business days to thoroughly review all your results before contacting the office for clarification. Should we see a critical lab result, you will be contacted sooner.   If You Need Anything After Your Visit  If you have any questions or concerns for your doctor, please call our main line at [phone] and press option 4 to reach your doctor's medical assistant. If no one answers, please leave a voicemail as directed and we will return your call as soon as possible. Messages left after 4 pm will be answered the following business day.   You may also send us a message via MyChart. We typically respond to MyChart messages within 1-2 business days.  For prescription refills, please ask your pharmacy to contact our office. Our fax number is [phone].  If you have an urgent issue when the clinic is closed that cannot wait until the next business day, you can page  your doctor at the number below.    Please note that while we do our best to be available for urgent issues outside of office hours, we are not available 24/7.   If you have an urgent issue and are unable to reach us, you may choose to seek medical care at your doctor's office, retail clinic, urgent care center, or emergency room.  If you have a medical emergency, please immediately call 911 or go to the emergency department.  Pager Numbers  - Dr. Kowalski: [phone]  - Dr. Moye: [phone]  - Dr. Stewart: [phone]  In the event of inclement weather, please call our main line at [phone] for an update on the status of any delays or closures.  Dermatology Medication Tips: Please keep the boxes that topical medications come in in order to help keep track of the instructions about where and how to use these. Pharmacies typically print the medication instructions only on the boxes and not directly on the medication tubes.   If your medication is too expensive, please contact our office at [phone] option 4 or send us a message through MyChart.   We are unable to tell what your co-pay for medications will be in advance as this is different depending on your insurance coverage. However, we may be able to find a substitute medication at lower cost or fill out paperwork to get insurance to cover a needed medication.   If a prior authorization is required to get your medication covered by your insurance company, please allow us 1-2 business days to   complete this process.  Drug prices often vary depending on where the prescription is filled and some pharmacies may offer cheaper prices.  The website www.goodrx.com contains coupons for medications through different pharmacies. The prices here do not account for what the cost may be with help from insurance (it may be cheaper with your insurance), but the website can give you the price if you did not use any insurance.  - You can  print the associated coupon and take it with your prescription to the pharmacy.  - You may also stop by our office during regular business hours and pick up a GoodRx coupon card.  - If you need your prescription sent electronically to a different pharmacy, notify our office through [facility] MyChart or by phone at [phone] option 4.     Si Usted Necesita Algo Despus de Su Visita  Tambin puede enviarnos un mensaje a travs de MyChart. Por lo general respondemos a los mensajes de MyChart en el transcurso de 1 a 2 das hbiles.  Para renovar recetas, por favor pida a su farmacia que se ponga en contacto con nuestra oficina. Nuestro nmero de fax es el [phone].  Si tiene un asunto urgente cuando la clnica est cerrada y que no puede esperar hasta el siguiente da hbil, puede llamar/localizar a su doctor(a) al nmero que aparece a continuacin.   Por favor, tenga en cuenta que aunque hacemos todo lo posible para estar disponibles para asuntos urgentes fuera del horario de oficina, no estamos disponibles las 24 horas del da, los 7 das de la semana.   Si tiene un problema urgente y no puede comunicarse con nosotros, puede optar por buscar atencin mdica  en el consultorio de su doctor(a), en una clnica privada, en un centro de atencin urgente o en una sala de emergencias.  Si tiene una emergencia mdica, por favor llame inmediatamente al 911 o vaya a la sala de emergencias.  Nmeros de bper  - Dr. Kowalski: [phone]  - Dra. Moye: [phone]  - Dra. Stewart: [phone]  En caso de inclemencias del tiempo, por favor llame a nuestra lnea principal al [phone] para una actualizacin sobre el estado de cualquier retraso o cierre.  Consejos para la medicacin en dermatologa: Por favor, guarde las cajas en las que vienen los medicamentos de uso tpico para ayudarle a seguir las instrucciones sobre dnde y cmo usarlos. Las farmacias generalmente imprimen las  instrucciones del medicamento slo en las cajas y no directamente en los tubos del medicamento.   Si su medicamento es muy caro, por favor, pngase en contacto con nuestra oficina llamando al [phone] y presione la opcin 4 o envenos un mensaje a travs de MyChart.   No podemos decirle cul ser su copago por los medicamentos por adelantado ya que esto es diferente dependiendo de la cobertura de su seguro. Sin embargo, es posible que podamos encontrar un medicamento sustituto a menor costo o llenar un formulario para que el seguro cubra el medicamento que se considera necesario.   Si se requiere una autorizacin previa para que su compaa de seguros cubra su medicamento, por favor permtanos de 1 a 2 das hbiles para completar este proceso.  Los precios de los medicamentos varan con frecuencia dependiendo del lugar de dnde se surte la receta y alguna farmacias pueden ofrecer precios ms baratos.  El sitio web www.goodrx.com tiene cupones para medicamentos de diferentes farmacias. Los precios aqu no tienen en cuenta lo que podra costar con la ayuda del   seguro (puede ser ms barato con su seguro), pero el sitio web puede darle el precio si no utiliz ningn seguro.  - Puede imprimir el cupn correspondiente y llevarlo con su receta a la farmacia.  - Tambin puede pasar por nuestra oficina durante el horario de atencin regular y recoger una tarjeta de cupones de GoodRx.  - Si necesita que su receta se enve electrnicamente a una farmacia diferente, informe a nuestra oficina a travs de MyChart de [facility] o por telfono llamando al [phone] y presione la opcin 4.  

## 2022-10-01 DIAGNOSIS — G5601 Carpal tunnel syndrome, right upper limb: Secondary | ICD-10-CM | POA: Diagnosis not present

## 2022-10-01 DIAGNOSIS — M65321 Trigger finger, right index finger: Secondary | ICD-10-CM | POA: Diagnosis not present

## 2022-11-03 DIAGNOSIS — G4733 Obstructive sleep apnea (adult) (pediatric): Secondary | ICD-10-CM | POA: Diagnosis not present

## 2022-11-03 DIAGNOSIS — Z720 Tobacco use: Secondary | ICD-10-CM | POA: Diagnosis not present

## 2022-11-11 DIAGNOSIS — G4733 Obstructive sleep apnea (adult) (pediatric): Secondary | ICD-10-CM | POA: Diagnosis not present

## 2022-11-19 ENCOUNTER — Ambulatory Visit
Admission: RE | Admit: 2022-11-19 | Discharge: 2022-11-19 | Disposition: A | Discharge status: Home / Self Care | Payer: BC Managed Care – PPO | Source: Ambulatory Visit | Attending: Family Medicine | Admitting: Family Medicine

## 2022-11-19 DIAGNOSIS — Z1231 Encounter for screening mammogram for malignant neoplasm of breast: Secondary | ICD-10-CM | POA: Diagnosis not present

## 2022-12-09 DIAGNOSIS — M65321 Trigger finger, right index finger: Secondary | ICD-10-CM | POA: Diagnosis not present

## 2022-12-09 DIAGNOSIS — Z8781 Personal history of (healed) traumatic fracture: Secondary | ICD-10-CM | POA: Diagnosis not present

## 2022-12-09 DIAGNOSIS — Z9889 Other specified postprocedural states: Secondary | ICD-10-CM | POA: Diagnosis not present

## 2022-12-09 DIAGNOSIS — G5601 Carpal tunnel syndrome, right upper limb: Secondary | ICD-10-CM | POA: Diagnosis not present

## 2022-12-10 DIAGNOSIS — G4733 Obstructive sleep apnea (adult) (pediatric): Secondary | ICD-10-CM | POA: Diagnosis not present

## 2023-01-04 ENCOUNTER — Other Ambulatory Visit: Payer: BC Managed Care – PPO

## 2023-01-04 DIAGNOSIS — E782 Mixed hyperlipidemia: Secondary | ICD-10-CM

## 2023-01-04 DIAGNOSIS — R7309 Other abnormal glucose: Secondary | ICD-10-CM

## 2023-01-04 DIAGNOSIS — E039 Hypothyroidism, unspecified: Secondary | ICD-10-CM | POA: Diagnosis not present

## 2023-01-05 LAB — HEMOGLOBIN A1C
Hgb A1c MFr Bld: 6.2 % of total Hgb — ABNORMAL HIGH (ref <5.7)
Mean Plasma Glucose: 131 mg/dL
eAG (mmol/L): 7.3 mmol/L

## 2023-01-05 LAB — LIPID PANEL
Cholesterol: 210 mg/dL — ABNORMAL HIGH (ref <200)
HDL: 49 mg/dL — ABNORMAL LOW (ref >=50)
LDL Cholesterol (Calc): 124 mg/dL (calc) — ABNORMAL HIGH
Non-HDL Cholesterol (Calc): 161 mg/dL (calc) — ABNORMAL HIGH (ref <130)
Total CHOL/HDL Ratio: 4.3 (calc) (ref <5.0)
Triglycerides: 228 mg/dL — ABNORMAL HIGH (ref <150)

## 2023-01-05 LAB — T4, FREE: Free T4: 1.1 ng/dL (ref 0.8–1.8)

## 2023-01-05 LAB — TSH: TSH: 1.98 mIU/L (ref 0.40–4.50)

## 2023-01-10 DIAGNOSIS — G4733 Obstructive sleep apnea (adult) (pediatric): Secondary | ICD-10-CM | POA: Diagnosis not present

## 2023-01-11 ENCOUNTER — Encounter: Payer: Self-pay | Admitting: Family Medicine

## 2023-01-11 ENCOUNTER — Other Ambulatory Visit: Payer: Self-pay | Admitting: Family Medicine

## 2023-01-11 ENCOUNTER — Ambulatory Visit (INDEPENDENT_AMBULATORY_CARE_PROVIDER_SITE_OTHER): Payer: BC Managed Care – PPO | Admitting: Family Medicine

## 2023-01-11 VITALS — BP 116/76 | HR 91 | Ht 62.0 in | Wt 161.0 lb

## 2023-01-11 DIAGNOSIS — E782 Mixed hyperlipidemia: Secondary | ICD-10-CM | POA: Diagnosis not present

## 2023-01-11 DIAGNOSIS — E039 Hypothyroidism, unspecified: Secondary | ICD-10-CM

## 2023-01-11 DIAGNOSIS — R350 Frequency of micturition: Secondary | ICD-10-CM | POA: Diagnosis not present

## 2023-01-11 DIAGNOSIS — R3915 Urgency of urination: Secondary | ICD-10-CM

## 2023-01-11 DIAGNOSIS — I10 Essential (primary) hypertension: Secondary | ICD-10-CM

## 2023-01-11 DIAGNOSIS — J432 Centrilobular emphysema: Secondary | ICD-10-CM

## 2023-01-11 DIAGNOSIS — N3000 Acute cystitis without hematuria: Secondary | ICD-10-CM | POA: Diagnosis not present

## 2023-01-11 DIAGNOSIS — G4733 Obstructive sleep apnea (adult) (pediatric): Secondary | ICD-10-CM

## 2023-01-11 DIAGNOSIS — Z0000 Encounter for general adult medical examination without abnormal findings: Secondary | ICD-10-CM

## 2023-01-11 DIAGNOSIS — R7309 Other abnormal glucose: Secondary | ICD-10-CM

## 2023-01-11 MED ORDER — CIPROFLOXACIN HCL 500 MG PO TABS: 500.0000 mg | ORAL_TABLET | Freq: Two times a day (BID) | ORAL | 0 refills | Status: DC

## 2023-01-11 NOTE — Assessment & Plan Note (Signed)
Followed by [facility] without flare up 

## 2023-01-11 NOTE — Progress Notes (Signed)
Subjective:    Patient ID: Sherry Oliver, female    DOB: Jun 20, 1961, 62 y.o.   MRN: [id number]  Sherry Oliver is a 62 y.o. female presenting on 01/11/2023 for Medical Management of Chronic Issues and Hypothyroidism   HPI  Urinary Urgency / Pelvic Pressure Reports recent issue with urinary frequency urgency Requesting urinalysis No dysuria or blood or fever chills  CHRONIC HTN: Reports occasional home BP checks. Has intermittent elevation, admits stressors Current Meds - HCTZ 25mg  daily - Off Amlodipine due to swelling Lifestyle: - Diet: admits some sodium / caffeine in diet, not always hydrating - Exercise: not as active due to knee pain Denies CP, dyspnea, HA, edema, dizziness / lightheadedness   Hypothyroidism Chronic problem for >30 years. History of temperature instability and lab test showed problem. Last lab TSH and T4 normal. Currently taking Levothyroxine daily and Cytomel 5mg  daily, no change in dose and no symptoms.  HYPERLIPIDEMIA: - Reports history issues of high cholesterol and failed statins due to myalgia. 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 Often will eat a sweet snack etc. - Exercise (Limited by by exercise due to knee pain) Denies hypoglycemia, polyuria, visual changes, numbness or tingling   GERD Taking Nexium 20mg  OTC BID    Former Smoker   OSA on CPAP [facility] Karna Christmas, now on new CPAP machine,  upgraded from 2011 Improved on CPAP, using nightly, benefits from therapy     Additional update Mother has been diagnosed with bladder cancer again and she will start chemo again and Sherry Oliver has had some increased stress with this. She is primary caregiver       01/11/2023    2:38 PM 06/05/2022    2:38 PM 05/25/2022    9:13 AM  Depression screen PHQ 2/9  Decreased Interest 1 1 0  Down, Depressed, Hopeless 1 1 0  PHQ - 2 Score 2 2 0  Altered sleeping 1 2   Tired, decreased energy 1 2   Change in appetite 0 0   Feeling bad or failure about yourself  0 0   Trouble concentrating 1 1   Moving slowly or fidgety/restless 0 0   Suicidal thoughts 0 0   PHQ-9 Score 5 7   Difficult doing work/chores  Somewhat difficult     Social History   Tobacco Use   Smoking status: Former    Types: Cigarettes    Quit date: 03/14/2018    Years since quitting: 4.8   Smokeless tobacco: Former  Building services engineer Use: Never used  Substance Use Topics   Alcohol use: Yes    Comment: rare   Drug use: Not Currently    Review of Systems Per HPI unless specifically indicated above     Objective:    BP 116/76   Pulse 91   Ht 5\' 2"  (1.575 m)   Wt 161 lb (73 kg)   SpO2 95%   BMI 29.45 kg/m   Wt Readings from  Last 3 Encounters:  01/11/23 161 lb (73 kg)  07/07/22 157 lb (71.2 kg)  06/05/22 154 lb 6.4 oz (70 kg)    Physical Exam Vitals and nursing note reviewed.  Constitutional:      General: She is not in acute distress.    Appearance: Normal appearance. She is well-developed. She is not diaphoretic.     Comments: Well-appearing, comfortable, cooperative  HENT:     Head: Normocephalic and atraumatic.  Eyes:     General:        Right eye: No discharge.        Left eye: No discharge.     Conjunctiva/sclera: Conjunctivae normal.  Cardiovascular:     Rate and Rhythm: Normal rate.  Pulmonary:     Effort: Pulmonary effort is normal.  Skin:    General: Skin is warm and dry.      Findings: No erythema or rash.  Neurological:     Mental Status: She is alert and oriented to person, place, and time.  Psychiatric:        Mood and Affect: Mood normal.        Behavior: Behavior normal.        Thought Content: Thought content normal.     Comments: Well groomed, good eye contact, normal speech and thoughts    Results for orders placed or performed in visit on 01/04/23  T4, free  Result Value Ref Range   Free T4 1.1 0.8 - 1.8 ng/dL  TSH  Result Value Ref Range   TSH 1.98 0.40 - 4.50 mIU/L  Hemoglobin A1c  Result Value Ref Range   Hgb A1c MFr Bld 6.2 (H) <5.7 % of total Hgb   Mean Plasma Glucose 131 mg/dL   eAG (mmol/L) 7.3 mmol/L  Lipid panel  Result Value Ref Range   Cholesterol 210 (H) <200 mg/dL   HDL 49 (L) > OR = 50 mg/dL   Triglycerides 161 (H) <150 mg/dL   LDL Cholesterol (Calc) 124 (H) mg/dL (calc)   Total CHOL/HDL Ratio 4.3 <5.0 (calc)   Non-HDL Cholesterol (Calc) 161 (H) <130 mg/dL (calc)      Assessment & Plan:   Problem List Items Addressed This Visit     Acquired hypothyroidism - Primary    Controlled on current regimen Last lab TSH T4 Normal Currently taking Levothyroxine daily and Cytomel 5mg  daily      Centrilobular emphysema (HCC)    Followed by [facility] without flare up      Elevated hemoglobin A1c    A1c mild up to 6.2, gradual increase Stable overall Improve lifestyle      Essential hypertension    Controlled - Home BP readings   Complication w/ CVA history  Off Amlodipine due to swelling  Plan:  1. Continue current BP regimen HCTZ 25mg  daily - chemistry reviewed, normal K Cr and Na. 2. Encourage improved lifestyle - low sodium diet, regular exercise 3. Continue monitor BP outside office, bring readings to next visit, if persistently >140/90 or new symptoms notify office sooner      Mixed hyperlipidemia    Stable, without increase in LDL Tolerating Pravastatin 20mg  dose daily  Prior failed statins  - Simvastatin, Atorvastatin, Rosuvastatin, Pitavastatin History of CVA, on statin for secondary risk reduction  Future reconsider Nexlizent vs PCSK9      OSA on CPAP    followed by [facility], on CPAP  Chronic OSA on CPAP - Good adherence to CPAP nightly - Continue  current CPAP therapy, patient seems to be benefiting from therapy       Other Visit Diagnoses     Urinary urgency       Relevant Orders   Urinalysis, Routine w reflex microscopic   Urine Culture   Urinary frequency       Relevant Orders   Urinalysis, Routine w reflex microscopic   Urine Culture   Acute cystitis without hematuria       Relevant Medications   ciprofloxacin (CIPRO) 500 MG tablet   Other Relevant Orders   Urinalysis, Routine w reflex microscopic   Urine Culture       Orders Placed This Encounter  Procedures   Urine Culture   Urinalysis, Routine w reflex microscopic   Check urine today Urine test today, stay tuned for results. Printed Cipro for twice a day for 5 days if the urine comes back abnormal and we suggest antibiotic, culture can confirm.  Meds ordered this encounter  Medications   ciprofloxacin (CIPRO) 500 MG tablet    Sig: Take 1 tablet (500 mg total) by mouth 2 (two) times daily.    Dispense:  10 tablet    Refill:  0     Follow up plan: Return in about 6 months (around 07/13/2023) for 6 month fasting lab only then 1 week later Annual Physical.  Future labs ordered for 06/2023   Saralyn Pilar, DO [facility] 01/11/2023, 2:59 PM

## 2023-01-11 NOTE — Patient Instructions (Addendum)
Thank you for coming to the office today.  Keep on Pravastatin Cholesterol slightly improved  A1c slightly increased from 6.1 to 6.2 Try to avoid excess sweets carb starches No medication needed  Thyroid is in good shape. No changes needed  Urine test today, stay tuned for results. Printed Cipro for twice a day for 5 days if the urine comes back abnormal and we suggest antibiotic, culture can confirm.  DUE for FASTING BLOOD WORK (no food or drink after midnight before the lab appointment, only water or coffee without cream/sugar on the morning of)  SCHEDULE "Lab Only" visit in the morning at the clinic for lab draw in 6 MONTHS   - Make sure Lab Only appointment is at about 1 week before your next appointment, so that results will be available  For Lab Results, once available within 2-3 days of blood draw, you can can log in to MyChart online to view your results and a brief explanation. Also, we can discuss results at next follow-up visit.   Please schedule a Follow-up Appointment to: Return in about 6 months (around 07/13/2023) for 6 month fasting lab only then 1 week later Annual Physical.  If you have any other questions or concerns, please feel free to call the office or send a message through MyChart. You may also schedule an earlier appointment if necessary.  Additionally, you may be receiving a survey about your experience at our office within a few days to 1 week by e-mail or mail. We value your feedback.  Saralyn Pilar, DO [facility], [place]

## 2023-01-11 NOTE — Assessment & Plan Note (Signed)
A1c mild up to 6.2, gradual increase Stable overall Improve lifestyle

## 2023-01-11 NOTE — Assessment & Plan Note (Signed)
Stable, without increase in LDL Tolerating Pravastatin 20mg  dose daily  Prior failed statins - Simvastatin, Atorvastatin, Rosuvastatin, Pitavastatin History of CVA, on statin for secondary risk reduction  Future reconsider Nexlizent vs PCSK9

## 2023-01-11 NOTE — Assessment & Plan Note (Signed)
Controlled on current regimen Last lab TSH T4 Normal Currently taking Levothyroxine 88mcg daily and Cytomel 5mg daily 

## 2023-01-11 NOTE — Assessment & Plan Note (Addendum)
followed by [facility], on CPAP  Chronic OSA on CPAP - Good adherence to CPAP nightly - Continue current CPAP therapy, patient seems to be benefiting from therapy

## 2023-01-11 NOTE — Assessment & Plan Note (Signed)
Controlled - Home BP readings   Complication w/ CVA history  Off Amlodipine due to swelling  Plan:  1. Continue current BP regimen HCTZ 25mg daily - chemistry reviewed, normal K Cr and Na. 2. Encourage improved lifestyle - low sodium diet, regular exercise 3. Continue monitor BP outside office, bring readings to next visit, if persistently >140/90 or new symptoms notify office sooner 

## 2023-01-12 LAB — URINALYSIS, ROUTINE W REFLEX MICROSCOPIC
Bilirubin Urine: NEGATIVE
Glucose, UA: NEGATIVE
Hgb urine dipstick: NEGATIVE
Ketones, ur: NEGATIVE
Leukocytes,Ua: NEGATIVE
Nitrite: NEGATIVE
Protein, ur: NEGATIVE
Specific Gravity, Urine: 1.006 (ref 1.001–1.035)
pH: 7.5 (ref 5.0–8.0)

## 2023-01-12 LAB — URINE CULTURE
MICRO NUMBER:: 14888522
SPECIMEN QUALITY:: ADEQUATE

## 2023-01-19 ENCOUNTER — Encounter: Payer: Self-pay | Admitting: Family Medicine

## 2023-01-19 ENCOUNTER — Telehealth: Payer: Self-pay

## 2023-01-19 NOTE — Telephone Encounter (Signed)
-----   Message from Smitty Cords, DO sent at 01/18/2023  6:11 PM EDT ----- Please contact patient to review the following - Urology referral request  Urine culture was inconclusive with only small amount of colonizing bacteria. Not consistent with urinary infection.  If symptoms unresolved, I would suggest referral to urology. Please let me know if you would like to proceed w/ this referral.  Saralyn Pilar, DO [facility] 01/18/2023, 6:11 PM

## 2023-01-19 NOTE — Telephone Encounter (Signed)
Left message for patient to call back regarding results and recommendations.  OK for [facility] to give results.

## 2023-02-11 ENCOUNTER — Encounter: Payer: Self-pay | Admitting: Dermatology

## 2023-02-11 ENCOUNTER — Ambulatory Visit: Payer: BC Managed Care – PPO | Admitting: Dermatology

## 2023-02-11 DIAGNOSIS — D2372 Other benign neoplasm of skin of left lower limb, including hip: Secondary | ICD-10-CM

## 2023-02-11 DIAGNOSIS — L84 Corns and callosities: Secondary | ICD-10-CM

## 2023-02-11 DIAGNOSIS — D2371 Other benign neoplasm of skin of right lower limb, including hip: Secondary | ICD-10-CM

## 2023-02-11 NOTE — Patient Instructions (Signed)
Due to recent changes in healthcare laws, you may see results of your pathology and/or laboratory studies on MyChart before the doctors have had a chance to review them. We understand that in some cases there may be results that are confusing or concerning to you. Please understand that not all results are received at the same time and often the doctors may need to interpret multiple results in order to provide you with the best plan of care or course of treatment. Therefore, we ask that you please give us 2 business days to thoroughly review all your results before contacting the office for clarification. Should we see a critical lab result, you will be contacted sooner.   If You Need Anything After Your Visit  If you have any questions or concerns for your doctor, please call our main line at [phone] and press option 4 to reach your doctor's medical assistant. If no one answers, please leave a voicemail as directed and we will return your call as soon as possible. Messages left after 4 pm will be answered the following business day.   You may also send us a message via MyChart. We typically respond to MyChart messages within 1-2 business days.  For prescription refills, please ask your pharmacy to contact our office. Our fax number is [phone].  If you have an urgent issue when the clinic is closed that cannot wait until the next business day, you can page your doctor at the number below.    Please note that while we do our best to be available for urgent issues outside of office hours, we are not available 24/7.   If you have an urgent issue and are unable to reach us, you may choose to seek medical care at your doctor's office, retail clinic, urgent care center, or emergency room.  If you have a medical emergency, please immediately call 911 or go to the emergency department.  Pager Numbers  - Dr. Kowalski: [phone]  - Dr. Moye: [phone]  - Dr. Stewart:  [phone]  In the event of inclement weather, please call our main line at [phone] for an update on the status of any delays or closures.  Dermatology Medication Tips: Please keep the boxes that topical medications come in in order to help keep track of the instructions about where and how to use these. Pharmacies typically print the medication instructions only on the boxes and not directly on the medication tubes.   If your medication is too expensive, please contact our office at [phone] option 4 or send us a message through MyChart.   We are unable to tell what your co-pay for medications will be in advance as this is different depending on your insurance coverage. However, we may be able to find a substitute medication at lower cost or fill out paperwork to get insurance to cover a needed medication.   If a prior authorization is required to get your medication covered by your insurance company, please allow us 1-2 business days to complete this process.  Drug prices often vary depending on where the prescription is filled and some pharmacies may offer cheaper prices.  The website www.goodrx.com contains coupons for medications through different pharmacies. The prices here do not account for what the cost may be with help from insurance (it may be cheaper with your insurance), but the website can give you the price if you did not use any insurance.  - You can print the associated coupon and take it with   your prescription to the pharmacy.  - You may also stop by our office during regular business hours and pick up a GoodRx coupon card.  - If you need your prescription sent electronically to a different pharmacy, notify our office through [facility] MyChart or by phone at [phone] option 4.     Si Usted Necesita Algo Despus de Su Visita  Tambin puede enviarnos un mensaje a travs de MyChart. Por lo general respondemos a los mensajes de MyChart en el transcurso de 1 a 2  das hbiles.  Para renovar recetas, por favor pida a su farmacia que se ponga en contacto con nuestra oficina. Nuestro nmero de fax es el [phone].  Si tiene un asunto urgente cuando la clnica est cerrada y que no puede esperar hasta el siguiente da hbil, puede llamar/localizar a su doctor(a) al nmero que aparece a continuacin.   Por favor, tenga en cuenta que aunque hacemos todo lo posible para estar disponibles para asuntos urgentes fuera del horario de oficina, no estamos disponibles las 24 horas del da, los 7 das de la semana.   Si tiene un problema urgente y no puede comunicarse con nosotros, puede optar por buscar atencin mdica  en el consultorio de su doctor(a), en una clnica privada, en un centro de atencin urgente o en una sala de emergencias.  Si tiene una emergencia mdica, por favor llame inmediatamente al 911 o vaya a la sala de emergencias.  Nmeros de bper  - Dr. Kowalski: [phone]  - Dra. Moye: [phone]  - Dra. Stewart: [phone]  En caso de inclemencias del tiempo, por favor llame a nuestra lnea principal al [phone] para una actualizacin sobre el estado de cualquier retraso o cierre.  Consejos para la medicacin en dermatologa: Por favor, guarde las cajas en las que vienen los medicamentos de uso tpico para ayudarle a seguir las instrucciones sobre dnde y cmo usarlos. Las farmacias generalmente imprimen las instrucciones del medicamento slo en las cajas y no directamente en los tubos del medicamento.   Si su medicamento es muy caro, por favor, pngase en contacto con nuestra oficina llamando al [phone] y presione la opcin 4 o envenos un mensaje a travs de MyChart.   No podemos decirle cul ser su copago por los medicamentos por adelantado ya que esto es diferente dependiendo de la cobertura de su seguro. Sin embargo, es posible que podamos encontrar un medicamento sustituto a menor costo o llenar un formulario para que el  seguro cubra el medicamento que se considera necesario.   Si se requiere una autorizacin previa para que su compaa de seguros cubra su medicamento, por favor permtanos de 1 a 2 das hbiles para completar este proceso.  Los precios de los medicamentos varan con frecuencia dependiendo del lugar de dnde se surte la receta y alguna farmacias pueden ofrecer precios ms baratos.  El sitio web www.goodrx.com tiene cupones para medicamentos de diferentes farmacias. Los precios aqu no tienen en cuenta lo que podra costar con la ayuda del seguro (puede ser ms barato con su seguro), pero el sitio web puede darle el precio si no utiliz ningn seguro.  - Puede imprimir el cupn correspondiente y llevarlo con su receta a la farmacia.  - Tambin puede pasar por nuestra oficina durante el horario de atencin regular y recoger una tarjeta de cupones de GoodRx.  - Si necesita que su receta se enve electrnicamente a una farmacia diferente, informe a nuestra oficina a travs de MyChart de [place]   o por telfono llamando al [phone] y presione la opcin 4.  

## 2023-02-11 NOTE — Progress Notes (Signed)
   Follow-Up Visit   Subjective  Sherry Oliver is a 62 y.o. female who presents for the following: Spots on feet. Has been having them cut off by her podiatrist. One has come back on left foot, similar new one on right foot. Painful to walk. Present for years. Podiatrist states they are inflamed sweat glands.   The patient has spots, moles and lesions to be evaluated, some may be new or changing and the patient has concerns that these could be cancer.  The following portions of the chart were reviewed this encounter and updated as appropriate: medications, allergies, medical history  Review of Systems:  No other skin or systemic complaints except as noted in HPI or Assessment and Plan.  Objective  Well appearing patient in no apparent distress; mood and affect are within normal limits.  A focused examination was performed of the following areas: Feet   Relevant physical exam findings are noted in the Assessment and Plan.  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 Condition is bothersome/symptomatic for patient. Currently flared.  Symptomatic, irritating, patient would like treated.  Benign-appearing.  Call clinic for new or changing lesions.   Literature is sparse, but based on literature review, these are often recurrent after paring but they may respond to destructive modalities such as cryotherapy or cantharidin. Immune stimulating therapy such as imiquimod has also been used and topicals such as fluorouracil, salicylic acid, imiquimod, and tretinoin have also been used.  Since she has failed repeated paring and these are significantly painful for her, suggest trial of  cryotherapy, squaric acid (prescription) and cantharone plus (prescription) with repeat treatment in ideally 2, up to 4 weeks after initial treatment in hopes of curing the lesions.  If this fails, could consider therapy with prescription WartPEEL (5-fluorouracil/salicylic acid) vs imiquimod +/- tretinoin or squaric acid. Of topical options, I expect 5-fluorouracil/salicylic acid may be more effective but it may also be harder to tolerate without having too much irritation to surrounding skin. A ring of vaseline around the affected area before application of the 5FU/salicylic acid may improve tolerability if she opts to try the 5FU/salicylic acid cream.  Weekly injection therapy with dilute alcohol sclerosing injections has also been suggested as a treatment, but it is not something I perform.   Additionally, could consider a trial of therapy with compounded cholesterol/lovastatin cream which is used for porokeratoses at other locations and has been reported to be helpful in punctate porokeratosis. However, this would be expected to palliate the lesion and reduce growth rather than cure it.  If not responding to more conservative therapies, could consider punch biopsy/removal for evaluation and hopefully to remove it without recurrence. However, given the risk of pain and complication at this location, would prefer to avoid biopsy/excision.   References related to some therapy options:  [email or web address]  A rare case of punctate porokeratosis treated with topical lovastatin/cholesterol [email or web address]   I, Lawson Radar, CMA, am acting as scribe for Darden Dates, MD.   Documentation: I have reviewed the above documentation for accuracy and completeness, and I agree with the above.  Darden Dates, MD

## 2023-02-12 ENCOUNTER — Encounter: Payer: Self-pay | Admitting: Dermatology

## 2023-02-16 DIAGNOSIS — G4733 Obstructive sleep apnea (adult) (pediatric): Secondary | ICD-10-CM | POA: Diagnosis not present

## 2023-04-01 ENCOUNTER — Ambulatory Visit: Payer: BC Managed Care – PPO | Admitting: Dermatology

## 2023-04-01 DIAGNOSIS — D2372 Other benign neoplasm of skin of left lower limb, including hip: Secondary | ICD-10-CM

## 2023-04-01 DIAGNOSIS — Z79899 Other long term (current) drug therapy: Secondary | ICD-10-CM

## 2023-04-01 DIAGNOSIS — D2371 Other benign neoplasm of skin of right lower limb, including hip: Secondary | ICD-10-CM

## 2023-04-01 DIAGNOSIS — L309 Dermatitis, unspecified: Secondary | ICD-10-CM | POA: Diagnosis not present

## 2023-04-01 DIAGNOSIS — L2089 Other atopic dermatitis: Secondary | ICD-10-CM | POA: Diagnosis not present

## 2023-04-01 DIAGNOSIS — D367 Benign neoplasm of other specified sites: Secondary | ICD-10-CM

## 2023-04-01 MED ORDER — DUPIXENT 300 MG/2ML ~~LOC~~ SOAJ
SUBCUTANEOUS | 1 refills | Status: DC
Start: 2023-04-01 — End: 2023-10-11

## 2023-04-01 MED ORDER — TACROLIMUS 0.1 % EX OINT: TOPICAL_OINTMENT | Freq: Every day | CUTANEOUS | 5 refills | Status: AC

## 2023-04-01 MED ORDER — DESOXIMETASONE 0.25 % EX CREA: TOPICAL_CREAM | CUTANEOUS | 3 refills | Status: DC

## 2023-04-01 NOTE — Addendum Note (Signed)
Addended by: Willeen Niece on: 04/01/2023 01:34 PM   Modules accepted: Level of Service

## 2023-04-01 NOTE — Patient Instructions (Addendum)
Recommend using Curad Mediplast pads. Cut to fit wart or callus. Cover with Elastoplast waterproof tape or any waterproof band-aid. Change every 3 to 4 days, or sooner if necessary.  Treatment may require several months of regular use before results are seen.    Due to recent changes in healthcare laws, you may see results of your pathology and/or laboratory studies on MyChart before the doctors have had a chance to review them. We understand that in some cases there may be results that are confusing or concerning to you. Please understand that not all results are received at the same time and often the doctors may need to interpret multiple results in order to provide you with the best plan of care or course of treatment. Therefore, we ask that you please give [place] 2 business days to thoroughly review all your results before contacting the office for clarification. Should we see a critical lab result, you will be contacted sooner.   If You Need Anything After Your Visit  If you have any questions or concerns for your doctor, please call our main line at [phone] and press option 4 to reach your doctor's medical assistant. If no one answers, please leave a voicemail as directed and we will return your call as soon as possible. Messages left after 4 pm will be answered the following business day.   You may also send [place] a message via MyChart. We typically respond to MyChart messages within 1-2 business days.  For prescription refills, please ask your pharmacy to contact our office. Our fax number is [phone].  If you have an urgent issue when the clinic is closed that cannot wait until the next business day, you can page your doctor at the number below.    Please note that while we do our best to be available for urgent issues outside of office hours, we are not available 24/7.   If you have an urgent issue and are unable to reach [place], you may choose to seek medical care at your doctor's  office, retail clinic, urgent care center, or emergency room.  If you have a medical emergency, please immediately call 911 or go to the emergency department.  Pager Numbers  - Dr. Gwen Pounds: [phone]  - Dr. Neale Burly: [phone]  - Dr. Roseanne Reno: [phone]  In the event of inclement weather, please call our main line at [phone] for an update on the status of any delays or closures.  Dermatology Medication Tips: Please keep the boxes that topical medications come in in order to help keep track of the instructions about where and how to use these. Pharmacies typically print the medication instructions only on the boxes and not directly on the medication tubes.   If your medication is too expensive, please contact our office at [phone] option 4 or send [place] a message through MyChart.   We are unable to tell what your co-pay for medications will be in advance as this is different depending on your insurance coverage. However, we may be able to find a substitute medication at lower cost or fill out paperwork to get insurance to cover a needed medication.   If a prior authorization is required to get your medication covered by your insurance company, please allow [place] 1-2 business days to complete this process.  Drug prices often vary depending on where the prescription is filled and some pharmacies may offer cheaper prices.  The website www.goodrx.com contains coupons for medications through different pharmacies. The prices here do  not account for what the cost may be with help from insurance (it may be cheaper with your insurance), but the website can give you the price if you did not use any insurance.  - You can print the associated coupon and take it with your prescription to the pharmacy.  - You may also stop by our office during regular business hours and pick up a GoodRx coupon card.  - If you need your prescription sent electronically to a different pharmacy, notify our office  through [facility] or by phone at [phone] option 4.     Si Usted Necesita Algo Despus de Su Visita  Tambin puede enviarnos un mensaje a travs de Clinical cytogeneticist. Por lo general respondemos a los mensajes de MyChart en el transcurso de 1 a 2 das hbiles.  Para renovar recetas, por favor pida a su farmacia que se ponga en contacto con nuestra oficina. Annie Sable de fax es [place] [phone].  Si tiene un asunto urgente cuando la clnica est cerrada y que no puede esperar hasta el siguiente da hbil, puede llamar/localizar a su doctor(a) al nmero que aparece a continuacin.   Por favor, tenga en cuenta que aunque hacemos todo lo posible para estar disponibles para asuntos urgentes fuera del horario de [place], no estamos disponibles las 24 horas del da, los 7 [address]  [address] de la [place].   Si tiene un problema urgente y no puede comunicarse con nosotros, puede optar por buscar atencin mdica  en el consultorio de su doctor(a), en una clnica privada, en un centro de atencin urgente o en una sala de emergencias.  Si tiene Engineer, drilling, por favor llame inmediatamente al 911 o vaya a la sala de emergencias.  Nmeros de bper  - Dr. Gwen Pounds: [phone]  - Dra. Moye: [phone]  - Dra. Roseanne Reno: [phone]  En caso de inclemencias del [place], por favor llame a Lacy Duverney principal al [phone] para una actualizacin sobre el [place] de cualquier retraso o cierre.  Consejos para la medicacin en dermatologa: Por favor, guarde las cajas en las que vienen los medicamentos de uso tpico para ayudarle a seguir las instrucciones sobre dnde y cmo usarlos. Las farmacias generalmente imprimen las instrucciones del medicamento slo en las cajas y no directamente en los tubos del [place].   Si su medicamento es muy caro, por favor, pngase en contacto con Rolm [place]ala llamando al [phone] y presione la opcin 4 o envenos un mensaje a travs de Clinical cytogeneticist.   No  podemos decirle cul ser su copago por los medicamentos por adelantado ya que esto es diferente dependiendo de la cobertura de su seguro. Sin embargo, es posible que podamos encontrar un medicamento sustituto a Audiological scientist un formulario para que el seguro cubra el medicamento que se considera necesario.   Si se requiere una autorizacin previa para que su compaa de seguros [place] su medicamento, por favor permtanos de 1 a 2 das hbiles para completar [address]  Los precios de los medicamentos varan con frecuencia dependiendo del Environmental consultant de dnde se surte la receta y alguna farmacias pueden ofrecer precios ms baratos.  El sitio web www.goodrx.com tiene cupones para medicamentos de Health and safety inspector. Los precios aqu no tienen en cuenta lo que podra costar con la ayuda del seguro (puede ser ms barato con su seguro), pero el sitio web puede darle el precio si no utiliz Tourist information centre manager.  - Puede imprimir el cupn correspondiente y llevarlo con su receta a la farmacia.  -  Tambin puede pasar por nuestra oficina durante el horario de atencin regular y Education officer, museum una tarjeta de cupones de GoodRx.  - Si necesita que su receta se enve electrnicamente a una farmacia diferente, informe a nuestra oficina a travs de MyChart de [employer] o por telfono llamando al [phone] y presione la opcin 4.

## 2023-04-01 NOTE — Progress Notes (Signed)
Follow-Up Visit   Subjective  Sherry Oliver is a 62 y.o. female who presents for the following: Atopic Dermatitis - currently using Dupixent 300mg /13mL SQ Q2W, Tacrolimus ointment QD PRN, and Desoximetasone cream QD PRN flares. Patient states that condition is doing well. Lesions on the feet that are tender and painful. Dr. Neale Burly previously dx as porokeratosis plantaris discreta and recommended treating with LN2, squaric acid, and cantharidin. Patient is here today to start treatment.   The following portions of the chart were reviewed this encounter and updated as appropriate: medications, allergies, medical history  Review of Systems:  No other skin or systemic complaints except as noted in HPI or Assessment and Plan.  Objective  Well appearing patient in no apparent distress; mood and affect are within normal limits.  Areas Examined: The hands, face, and feet  Relevant physical exam findings are noted in the Assessment and Plan.  L plantar foot ball x 2 (2), R plantar ball x 1, R lat plantar foot x 2 (3) Keratotic papules.    Assessment & Plan   Benign neoplasm of foot (5) L plantar foot ball x 2 (2); R plantar ball x 1, R lat plantar foot x 2 (3)  FAVOR POROKERATOSIS PLANTARIS DISCRETA- Symptomatic, pain with walking- paring attempted today, but procedure was too painful for patient.  Once areas healed, recommend using Curad Mediplast pads. Cut to fit wart or callus. Cover with Elastoplast waterproof tape or any waterproof band-aid. Change every 3 to 4 days, or sooner if necessary.  Treatment may require several months of regular use before results are seen.  Squaric Acid 3% applied to lesions today. Prior to application reviewed risk of inflammation and irritation.    Destruction of lesion - L plantar foot ball x 2 (2), R plantar ball x 1, R lat plantar foot x 2 (3)  Destruction method: cryotherapy   Informed consent: discussed and consent obtained   Lesion destroyed  using liquid nitrogen: Yes   Region frozen until ice ball extended beyond lesion: Yes   Outcome: patient tolerated procedure well with no complications   Post-procedure details: wound care instructions given    Destruction of lesion - L plantar foot ball x 2 (2), R plantar ball x 1, R lat plantar foot x 2 (3)  Destruction method: chemical removal   Informed consent: discussed and consent obtained   Timeout:  patient name, date of birth, surgical site, and procedure verified Chemical destruction method: cantharidin   Chemical destruction method comment:  Squaric acid 3%, cantharidin plus Application time:  6 hours Procedure instructions: patient instructed to wash and dry area   Outcome: patient tolerated procedure well with no complications   Post-procedure details: wound care instructions given   Additional details:  Patient advised to set alarm to remind them to wash off with soap and water at the directed time.  Cantharidin Plus is a blistering agent that comes from a beetle.  It needs to be washed off in about 4-6 hours after application.  Although it is painless when applied in office, it may cause symptoms of mild pain and burning several hours later.  Treated areas will swell and turn red, and blisters may form.  Vaseline and a bandaid may be applied until wound has healed.  Once healed, the skin may remain temporarily discolored.  It can take weeks to months for pigmentation to return to normal.  Advised to wash off with soap and water in 6 hours or sooner  if it becomes tender before then.   Other atopic dermatitis  Related Medications tacrolimus (PROTOPIC) 0.1 % ointment Apply topically daily.  Dupilumab (DUPIXENT) 300 MG/2ML SOPN INJECT 300 MG UNDER THE SKIN EVERY 14 DAYS  Eczema, unspecified type  Related Medications desoximetasone (TOPICORT) 0.25 % cream Apply to affected areas one to two times daily as needed until rash improved. Avoid applying to face, groin, and  axilla.   ATOPIC DERMATITIS - arms, hands, feet Exam: Hyperkeratosis of the feet, erythema of the palms <1% BSA  Chronic condition with duration or expected duration over one year. Currently well-controlled.  Atopic dermatitis - Severe, on Dupixent (biologic medication).  Atopic dermatitis (eczema) is a chronic, relapsing, pruritic condition that can significantly affect quality of life. It is often associated with allergic rhinitis and/or asthma and can require treatment with topical medications, phototherapy, or in severe cases biologic medications, which require long term medication management.    Treatment Plan: Continue Dupixent 300mg /23mL SQ Q2W, Tacrolimus ointment QD-BID PRN, and Desoximetasone cream to aa's QD-BID PRN flares  Dupilumab (Dupixent) is a treatment given by injection for adults and children with moderate-to-severe atopic dermatitis. Goal is control of skin condition, not cure. It is given as 2 injections at the first dose followed by 1 injection ever 2 weeks thereafter.  Young children are dosed monthly.  Potential side effects include allergic reaction, herpes infections, injection site reactions and conjunctivitis (inflammation of the eyes).  The use of Dupixent requires long term medication management, including periodic office visits.  Long term medication management.  Patient is using long term (months to years) prescription medication  to control their dermatologic condition.  These medications require periodic monitoring to evaluate for efficacy and side effects and may require periodic laboratory monitoring.   Recommend gentle skin care.  Return in about 4 weeks (around 04/29/2023) for porokeratosis plantaris discreta with Dr. Katrinka Blazing; 6 mths atopic dermatitis follow up .  Maylene Roes, CMA, am acting as scribe for Willeen Niece, MD .  Documentation: I have reviewed the above documentation for accuracy and completeness, and I agree with the above.  Willeen Niece, MD

## 2023-04-06 ENCOUNTER — Ambulatory Visit: Payer: BC Managed Care – PPO | Admitting: Dermatology

## 2023-04-18 DIAGNOSIS — G4733 Obstructive sleep apnea (adult) (pediatric): Secondary | ICD-10-CM | POA: Diagnosis not present

## 2023-04-26 ENCOUNTER — Other Ambulatory Visit: Payer: Self-pay

## 2023-04-26 DIAGNOSIS — Z87891 Personal history of nicotine dependence: Secondary | ICD-10-CM

## 2023-04-26 DIAGNOSIS — Z122 Encounter for screening for malignant neoplasm of respiratory organs: Secondary | ICD-10-CM

## 2023-04-29 ENCOUNTER — Ambulatory Visit: Payer: BC Managed Care – PPO | Admitting: Dermatology

## 2023-04-29 DIAGNOSIS — Q828 Other specified congenital malformations of skin: Secondary | ICD-10-CM | POA: Diagnosis not present

## 2023-04-29 DIAGNOSIS — L84 Corns and callosities: Secondary | ICD-10-CM

## 2023-04-29 DIAGNOSIS — D489 Neoplasm of uncertain behavior, unspecified: Secondary | ICD-10-CM

## 2023-04-29 NOTE — Progress Notes (Signed)
Follow-Up Visit   Subjective  Sherry Oliver is a 62 y.o. 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 She didn't start OTC corn/callus remover, due to pain after treatment.   The patient has spots, moles and lesions to be evaluated, some may be new or changing and the patient may have concern these could be cancer.   The following portions of the chart were reviewed this encounter and updated as appropriate: medications, allergies, medical history  Review of Systems:  No other skin or systemic complaints except as noted in HPI or Assessment and Plan.  Objective  Well appearing patient in no apparent distress; mood and affect are within normal limits.   A focused examination was performed of the following areas: the face and feet   Relevant exam findings are noted in the Assessment and Plan.  Feet Thickened skin.    Assessment & Plan    Porokeratosis (5) L plantar foot ball x 2 (2), R plantar ball x 1, R lat plantar foot x 2  FAVOR POROKERATOSIS PLANTARIS DISCRETA- Symptomatic, pain with walking - paring previously attempted, but procedure was too painful for patient.    Once areas healed, recommend using Curad Mediplast pads. Cut to fit wart or callus. Cover with Elastoplast waterproof tape or any waterproof band-aid. Change every 3 to 4 days, or sooner if necessary.  Treatment may require several months of regular use before results are seen.   Squaric Acid 3% applied to lesions today. Prior to application reviewed risk of inflammation and irritation.   Destruction of lesion - L plantar foot ball x 2 (2), R plantar ball x 1, R lat plantar foot x 2 (5) Complexity: simple   Destruction method: cryotherapy   Informed consent: discussed and consent obtained   Timeout:  patient  name, date of birth, surgical site, and procedure verified Lesion destroyed using liquid nitrogen: Yes   Region frozen until ice ball extended beyond lesion: Yes   Outcome: patient tolerated procedure well with no complications   Post-procedure details: wound care instructions given    Destruction of lesion - L plantar foot ball x 2 (2), R plantar ball x 1, R lat plantar foot x 2 (5)  Destruction method: chemical removal   Informed consent: discussed and consent obtained   Timeout:  patient name, date of birth, surgical site, and procedure verified Chemical destruction method comment:  Squaric acid 3%, cantharidin plus Procedure instructions: patient instructed to wash and dry area   Outcome: patient tolerated procedure well with no complications   Post-procedure details: wound care instructions given   Additional details:  Patient advised to set alarm to remind them to wash off with soap and water at the directed time.  Cantharidin Plus is a blistering agent that comes from a beetle.  It needs to be washed off in about 4-6 hours after application.  Although it is painless when applied in office, it may cause symptoms of mild pain and burning several hours later.  Treated areas will swell and turn red, and blisters may form.  Vaseline and a bandaid may be applied until wound has healed.  Once healed, the skin may remain temporarily discolored.  It can take weeks to months for pigmentation to return to normal.  Advised to wash off with soap and water in 6 hours  or sooner if it becomes tender before then.   Corns and callosities Feet  Recommend OTC salicyclic acid moisturizers like Amlactin or CeraVe psoriasis cream. Samples given of each today.    Return for porokeratosis plantaris discreta follow up in 4-6 weeks.  Maylene Roes, CMA, am acting as scribe for Elie Goody, MD .  Documentation: I have reviewed the above documentation for accuracy and completeness, and I agree with  the above.  Elie Goody, MD

## 2023-04-29 NOTE — Patient Instructions (Signed)

## 2023-05-01 ENCOUNTER — Encounter: Payer: Self-pay | Admitting: Dermatology

## 2023-05-06 ENCOUNTER — Other Ambulatory Visit: Payer: Self-pay | Admitting: Family Medicine

## 2023-05-06 DIAGNOSIS — I10 Essential (primary) hypertension: Secondary | ICD-10-CM

## 2023-05-06 NOTE — Telephone Encounter (Signed)
Requested Prescriptions  Pending Prescriptions Disp Refills   hydrochlorothiazide (HYDRODIURIL) 25 MG tablet [Pharmacy Med Name: HYDROCHLOROTHIAZIDE TABS 25MG ] 90 tablet 0    Sig: TAKE 1 TABLET DAILY     Cardiovascular: Diuretics - Thiazide Failed - 05/06/2023  1:01 AM      Failed - Cr in normal range and within 180 days    Creat  Date Value Ref Range Status  06/30/2022 0.83 0.50 - 1.05 mg/dL Final         Failed - K in normal range and within 180 days    Potassium  Date Value Ref Range Status  06/30/2022 3.7 3.5 - 5.3 mmol/L Final         Failed - Na in normal range and within 180 days    Sodium  Date Value Ref Range Status  06/30/2022 141 135 - 146 mmol/L Final         Passed - Last BP in normal range    BP Readings from Last 1 Encounters:  01/11/23 116/76         Passed - Valid encounter within last 6 months    Recent Outpatient Visits           3 months ago Acquired hypothyroidism   [place] [facility] Smitty Cords, DO   10 months ago Annual physical exam   [place] [facility] Smitty Cords, DO   11 months ago Right carpal tunnel syndrome   [place] [facility] Smitty Cords, DO   1 year ago Annual physical exam   [place] [facility] Smitty Cords, DO   2 years ago Mixed hyperlipidemia   [place] [facility] Smitty Cords, DO       Future Appointments             In 3 weeks Elie Goody, MD [facility] [place] [facility]   In 2 months Althea Charon, Netta Neat, DO [facility]   In 5 months Willeen Niece, MD [facility]

## 2023-05-19 DIAGNOSIS — G4733 Obstructive sleep apnea (adult) (pediatric): Secondary | ICD-10-CM | POA: Diagnosis not present

## 2023-05-25 DIAGNOSIS — J4540 Moderate persistent asthma, uncomplicated: Secondary | ICD-10-CM | POA: Diagnosis not present

## 2023-05-25 DIAGNOSIS — R040 Epistaxis: Secondary | ICD-10-CM | POA: Diagnosis not present

## 2023-05-31 ENCOUNTER — Ambulatory Visit: Payer: BC Managed Care – PPO | Admitting: Dermatology

## 2023-06-01 ENCOUNTER — Encounter: Payer: Self-pay | Admitting: Acute Care

## 2023-06-01 ENCOUNTER — Ambulatory Visit (INDEPENDENT_AMBULATORY_CARE_PROVIDER_SITE_OTHER): Payer: BC Managed Care – PPO | Admitting: Acute Care

## 2023-06-01 DIAGNOSIS — Z87891 Personal history of nicotine dependence: Secondary | ICD-10-CM | POA: Diagnosis not present

## 2023-06-01 NOTE — Patient Instructions (Signed)

## 2023-06-01 NOTE — Progress Notes (Signed)
Virtual Visit via Telephone Note  I connected with Sherry Oliver on 06/01/23 at  4:00 PM EDT by telephone and verified that I am speaking with the correct person using two identifiers.  Location: Patient: At home Provider: [address], [place], [place], [address]    I discussed the limitations, risks, security and privacy concerns of performing an evaluation and management service by telephone and the availability of in person appointments. I also discussed with the patient that there may be a patient responsible charge related to this service. The patient expressed understanding and agreed to proceed.     Shared Decision Making Visit Lung Cancer Screening Program [phone])   Eligibility: Age 62 y.o. Pack Years Smoking History Calculation 54 pack year smoking history (# packs/per year x # years smoked) Recent History of coughing up blood  no Unexplained weight loss? no ( >Than 15 pounds within the last 6 months ) Prior History Lung / other cancer no (Diagnosis within the last 5 years already requiring surveillance chest CT Scans). Smoking Status Former Smoker Former Smokers: Years since quit: 5 years  Quit Date: 03/14/2018  Visit Components: Discussion included one or more decision making aids. yes Discussion included risk/benefits of screening. yes Discussion included potential follow up diagnostic testing for abnormal scans. yes Discussion included meaning and risk of over diagnosis. yes Discussion included meaning and risk of False Positives. yes Discussion included meaning of total radiation exposure. yes  Counseling Included: Importance of adherence to annual lung cancer LDCT screening. yes Impact of comorbidities on ability to participate in the program. yes Ability and willingness to under diagnostic treatment. yes  Smoking Cessation Counseling: Current Smokers:  Discussed importance of smoking cessation. yes Information about tobacco cessation classes  and interventions provided to patient. yes Patient provided with "ticket" for LDCT Scan. yes Symptomatic Patient. no  Counseling NA Diagnosis Code: Tobacco Use Z72.0 Asymptomatic Patient yes  Counseling (Intermediate counseling: > three minutes counseling) H8469 Former Smokers:  Discussed the importance of maintaining cigarette abstinence. yes Diagnosis Code: Personal History of Nicotine Dependence. G29.528 Information about tobacco cessation classes and interventions provided to patient. Yes Patient provided with "ticket" for LDCT Scan. yes Written Order for Lung Cancer Screening with LDCT placed in Epic. Yes (CT Chest Lung Cancer Screening Low Dose W/O CM) [id number] Z12.2-Screening of respiratory organs Z87.891-Personal history of nicotine dependence  I spent 25 minutes of face to face time/virtual visit time  with  [name] discussing the risks and benefits of lung cancer screening. We took the time to pause the power point at intervals to allow for questions to be asked and answered to ensure understanding. We discussed that she had taken the single most powerful action possible to decrease her risk of developing lung cancer when she quit smoking. I counseled her to remain smoke free, and to contact me if she ever had the desire to smoke again so that I can provide resources and tools to help support the effort to remain smoke free. We discussed the time and location of the scan, and that either  Abigail Miyamoto RN, Karlton Lemon, RN or I  or I will call / send a letter with the results within  24-72 hours of receiving them. She has the office contact information in the event she needs to speak with me,  she verbalized understanding of all of the above and had no further questions upon leaving the office.     I explained to the patient that there  has been a high incidence of coronary artery disease noted on these exams. I explained that this is a non-gated exam therefore degree or severity  cannot be determined. This patient is on statin therapy. I have asked the patient to follow-up with their PCP regarding any incidental finding of coronary artery disease and management with diet or medication as they feel is clinically indicated. The patient verbalized understanding of the above and had no further questions.     Bevelyn Ngo, NP 06/01/2023

## 2023-06-02 ENCOUNTER — Ambulatory Visit
Admission: RE | Admit: 2023-06-02 | Discharge: 2023-06-02 | Disposition: A | Discharge status: Home / Self Care | Payer: BC Managed Care – PPO | Source: Ambulatory Visit | Attending: Family Medicine | Admitting: Family Medicine

## 2023-06-02 DIAGNOSIS — Z87891 Personal history of nicotine dependence: Secondary | ICD-10-CM | POA: Insufficient documentation

## 2023-06-02 DIAGNOSIS — Z122 Encounter for screening for malignant neoplasm of respiratory organs: Secondary | ICD-10-CM | POA: Insufficient documentation

## 2023-06-16 ENCOUNTER — Other Ambulatory Visit: Payer: Self-pay

## 2023-06-16 DIAGNOSIS — Z87891 Personal history of nicotine dependence: Secondary | ICD-10-CM

## 2023-06-16 DIAGNOSIS — Z122 Encounter for screening for malignant neoplasm of respiratory organs: Secondary | ICD-10-CM

## 2023-06-18 DIAGNOSIS — G4733 Obstructive sleep apnea (adult) (pediatric): Secondary | ICD-10-CM | POA: Diagnosis not present

## 2023-06-23 ENCOUNTER — Encounter: Payer: Self-pay | Admitting: Dermatology

## 2023-06-23 ENCOUNTER — Ambulatory Visit: Payer: BC Managed Care – PPO | Admitting: Dermatology

## 2023-06-23 DIAGNOSIS — Q828 Other specified congenital malformations of skin: Secondary | ICD-10-CM

## 2023-06-23 NOTE — Progress Notes (Signed)
   Follow-Up Visit   Subjective  Sherry Oliver is a 62 y.o. 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 She didn't start OTC corn/callus remover. Patient advised the right foot is more, left foot improved.   The patient has spots, moles and lesions to be evaluated, some may be new or changing and the patient may have concern these could be cancer.   The following portions of the chart were reviewed this encounter and updated as appropriate: medications, allergies, medical history  Review of Systems:  No other skin or systemic complaints except as noted in HPI or Assessment and Plan.  Objective  Well appearing patient in no apparent distress; mood and affect are within normal limits.   A focused examination was performed of the following areas: feet  Relevant exam findings are noted in the Assessment and Plan.  feet Thickened skin.     Assessment & Plan    Porokeratosis feet  FAVOR POROKERATOSIS PLANTARIS DISCRETA- Symptomatic, pain with walking - paring previously attempted, but procedure was too painful for patient.   Once areas healed, recommend using Curad Mediplast pads. Cut to fit wart or callus. Cover with Elastoplast waterproof tape or any waterproof band-aid. Change every 3 to 4 days, or sooner if necessary.  Treatment may require several months of regular use before results are seen.              Return in about 6 weeks (around 08/04/2023) for with Dr. Roseanne Reno.  Anise Salvo, RMA, am acting as scribe for Elie Goody, MD .   Documentation: I have reviewed the above documentation for accuracy and completeness, and I agree with the above.  Elie Goody, MD

## 2023-06-23 NOTE — Patient Instructions (Signed)

## 2023-07-06 ENCOUNTER — Other Ambulatory Visit: Payer: Self-pay | Admitting: Family Medicine

## 2023-07-06 DIAGNOSIS — E039 Hypothyroidism, unspecified: Secondary | ICD-10-CM

## 2023-07-07 NOTE — Telephone Encounter (Signed)
Requested Prescriptions  Pending Prescriptions Disp Refills   liothyronine (CYTOMEL) 5 MCG tablet [Pharmacy Med Name: LIOTHYRONINE SODIUM TABS 5MCG] 90 tablet 0    Sig: TAKE 1 TABLET DAILY     Endocrinology:  Hypothyroid Agents Passed - 07/06/2023  1:04 AM      Passed - TSH in normal range and within 360 days    TSH  Date Value Ref Range Status  01/04/2023 1.98 0.40 - 4.50 mIU/L Final         Passed - Valid encounter within last 12 months    Recent Outpatient Visits           5 months ago Acquired hypothyroidism   [facility] Smitty Cords, DO   1 year ago Annual physical exam   [facility] Smitty Cords, DO   1 year ago Right carpal tunnel syndrome   [place] [facility] Smitty Cords, DO   2 years ago Annual physical exam   [place] [facility] Smitty Cords, DO   2 years ago Mixed hyperlipidemia   [place] [facility] Smitty Cords, DO       Future Appointments             In 1 week Althea Charon, Netta Neat, DO [facility]   In 4 weeks Willeen Niece, MD [facility] [place] [facility]   In 3 months Willeen Niece, MD [facility] [place] [facility]

## 2023-07-13 ENCOUNTER — Other Ambulatory Visit: Payer: BC Managed Care – PPO

## 2023-07-13 DIAGNOSIS — E782 Mixed hyperlipidemia: Secondary | ICD-10-CM

## 2023-07-13 DIAGNOSIS — Z0000 Encounter for general adult medical examination without abnormal findings: Secondary | ICD-10-CM

## 2023-07-13 DIAGNOSIS — I10 Essential (primary) hypertension: Secondary | ICD-10-CM | POA: Diagnosis not present

## 2023-07-13 DIAGNOSIS — E039 Hypothyroidism, unspecified: Secondary | ICD-10-CM

## 2023-07-13 DIAGNOSIS — R7309 Other abnormal glucose: Secondary | ICD-10-CM | POA: Diagnosis not present

## 2023-07-14 LAB — COMPLETE METABOLIC PANEL WITHOUT GFR
AG Ratio: 1.3 (calc) (ref 1.0–2.5)
ALT: 17 U/L (ref 6–29)
AST: 15 U/L (ref 10–35)
Albumin: 4.3 g/dL (ref 3.6–5.1)
Alkaline phosphatase (APISO): 69 U/L (ref 37–153)
BUN/Creatinine Ratio: 21 (calc) (ref 6–22)
BUN: 23 mg/dL (ref 7–25)
CO2: 30 mmol/L (ref 20–32)
Calcium: 9.9 mg/dL (ref 8.6–10.4)
Chloride: 103 mmol/L (ref 98–110)
Creat: 1.12 mg/dL — ABNORMAL HIGH (ref 0.50–1.05)
Globulin: 3.2 g/dL (ref 1.9–3.7)
Glucose, Bld: 110 mg/dL — ABNORMAL HIGH (ref 65–99)
Potassium: 4.2 mmol/L (ref 3.5–5.3)
Sodium: 142 mmol/L (ref 135–146)
Total Bilirubin: 0.3 mg/dL (ref 0.2–1.2)
Total Protein: 7.5 g/dL (ref 6.1–8.1)
eGFR: 55 mL/min/1.73m2 — ABNORMAL LOW (ref >=60)

## 2023-07-14 LAB — CBC WITH DIFFERENTIAL/PLATELET
Absolute Lymphocytes: 1240 {cells}/uL (ref 850–3900)
Absolute Monocytes: 784 {cells}/uL (ref 200–950)
Basophils Absolute: 60 {cells}/uL (ref 0–200)
Basophils Relative: 0.9 %
Eosinophils Absolute: 261 {cells}/uL (ref 15–500)
Eosinophils Relative: 3.9 %
HCT: 40.7 % (ref 35.0–45.0)
Hemoglobin: 12.9 g/dL (ref 11.7–15.5)
MCH: 29.3 pg (ref 27.0–33.0)
MCHC: 31.7 g/dL — ABNORMAL LOW (ref 32.0–36.0)
MCV: 92.3 fL (ref 80.0–100.0)
MPV: 11.9 fL (ref 7.5–12.5)
Monocytes Relative: 11.7 %
Neutro Abs: 4355 {cells}/uL (ref 1500–7800)
Neutrophils Relative %: 65 %
Platelets: 414 Thousand/uL — ABNORMAL HIGH (ref 140–400)
RBC: 4.41 Million/uL (ref 3.80–5.10)
RDW: 12.5 % (ref 11.0–15.0)
Total Lymphocyte: 18.5 %
WBC: 6.7 Thousand/uL (ref 3.8–10.8)

## 2023-07-14 LAB — HEMOGLOBIN A1C
Hgb A1c MFr Bld: 6.2 %{Hb} — ABNORMAL HIGH (ref <5.7)
Mean Plasma Glucose: 131 mg/dL
eAG (mmol/L): 7.3 mmol/L

## 2023-07-14 LAB — LIPID PANEL
Cholesterol: 189 mg/dL (ref <200)
HDL: 52 mg/dL (ref >=50)
LDL Cholesterol (Calc): 109 mg/dL — ABNORMAL HIGH
Non-HDL Cholesterol (Calc): 137 mg/dL — ABNORMAL HIGH (ref <130)
Total CHOL/HDL Ratio: 3.6 (calc) (ref <5.0)
Triglycerides: 161 mg/dL — ABNORMAL HIGH (ref <150)

## 2023-07-14 LAB — TSH: TSH: 1.43 m[IU]/L (ref 0.40–4.50)

## 2023-07-14 LAB — T4, FREE: Free T4: 1 ng/dL (ref 0.8–1.8)

## 2023-07-16 ENCOUNTER — Other Ambulatory Visit: Payer: Self-pay | Admitting: Family Medicine

## 2023-07-16 DIAGNOSIS — E782 Mixed hyperlipidemia: Secondary | ICD-10-CM

## 2023-07-16 DIAGNOSIS — I6930 Unspecified sequelae of cerebral infarction: Secondary | ICD-10-CM

## 2023-07-16 NOTE — Telephone Encounter (Signed)
Requested Prescriptions  Pending Prescriptions Disp Refills   pravastatin (PRAVACHOL) 20 MG tablet [Pharmacy Med Name: PRAVASTATIN TABS 20MG ] 90 tablet 0    Sig: TAKE 1 TABLET DAILY     Cardiovascular:  Antilipid - Statins Failed - 07/16/2023  1:27 AM      Failed - Lipid Panel in normal range within the last 12 months    Cholesterol  Date Value Ref Range Status  07/13/2023 189 <200 mg/dL Final   LDL Cholesterol (Calc)  Date Value Ref Range Status  07/13/2023 109 (H) mg/dL (calc) Final    Comment:    Reference range: <100 . Desirable range <100 mg/dL for primary prevention;   <70 mg/dL for patients with CHD or diabetic patients  with > or = 2 CHD risk factors. Marland Kitchen LDL-C is now calculated using the Martin-Hopkins  calculation, which is a validated novel method providing  better accuracy than the Friedewald equation in the  estimation of LDL-C.  Horald Pollen et al. Lenox Ahr. 2956;213(08): 2061-2068  (http://education.[employer].com/faq/FAQ164)    HDL  Date Value Ref Range Status  07/13/2023 52 > OR = 50 mg/dL Final   Triglycerides  Date Value Ref Range Status  07/13/2023 161 (H) <150 mg/dL Final         Passed - Patient is not pregnant      Passed - Valid encounter within last 12 months    Recent Outpatient Visits           6 months ago Acquired hypothyroidism   [facility] Smitty Cords, DO   1 year ago Annual physical exam   [place] [facility] Smitty Cords, DO   1 year ago Right carpal tunnel syndrome   [place] [facility] Smitty Cords, DO   2 years ago Annual physical exam   [place] [facility] Smitty Cords, DO   2 years ago Mixed hyperlipidemia   [place] [facility] Althea Charon, Netta Neat, DO       Future Appointments             In 4 days Althea Charon, Netta Neat, DO [place] [facility] [place] [facility] [place]   In 2 weeks Willeen Niece, MD [facility] [place] [facility]   In 2 months Willeen Niece, MD [facility] [place] [facility]

## 2023-07-19 DIAGNOSIS — G4733 Obstructive sleep apnea (adult) (pediatric): Secondary | ICD-10-CM | POA: Diagnosis not present

## 2023-07-20 ENCOUNTER — Ambulatory Visit (INDEPENDENT_AMBULATORY_CARE_PROVIDER_SITE_OTHER): Payer: BC Managed Care – PPO | Admitting: Family Medicine

## 2023-07-20 ENCOUNTER — Encounter: Payer: Self-pay | Admitting: Family Medicine

## 2023-07-20 ENCOUNTER — Other Ambulatory Visit: Payer: Self-pay | Admitting: Family Medicine

## 2023-07-20 VITALS — BP 138/80 | HR 96 | Ht 60.5 in | Wt 161.2 lb

## 2023-07-20 DIAGNOSIS — G4733 Obstructive sleep apnea (adult) (pediatric): Secondary | ICD-10-CM

## 2023-07-20 DIAGNOSIS — Z0000 Encounter for general adult medical examination without abnormal findings: Secondary | ICD-10-CM

## 2023-07-20 DIAGNOSIS — I10 Essential (primary) hypertension: Secondary | ICD-10-CM

## 2023-07-20 DIAGNOSIS — E039 Hypothyroidism, unspecified: Secondary | ICD-10-CM

## 2023-07-20 DIAGNOSIS — Z1211 Encounter for screening for malignant neoplasm of colon: Secondary | ICD-10-CM | POA: Diagnosis not present

## 2023-07-20 DIAGNOSIS — Z23 Encounter for immunization: Secondary | ICD-10-CM | POA: Diagnosis not present

## 2023-07-20 DIAGNOSIS — I6930 Unspecified sequelae of cerebral infarction: Secondary | ICD-10-CM

## 2023-07-20 DIAGNOSIS — J432 Centrilobular emphysema: Secondary | ICD-10-CM

## 2023-07-20 DIAGNOSIS — E782 Mixed hyperlipidemia: Secondary | ICD-10-CM

## 2023-07-20 DIAGNOSIS — G2581 Restless legs syndrome: Secondary | ICD-10-CM

## 2023-07-20 DIAGNOSIS — R7309 Other abnormal glucose: Secondary | ICD-10-CM

## 2023-07-20 DIAGNOSIS — R7989 Other specified abnormal findings of blood chemistry: Secondary | ICD-10-CM

## 2023-07-20 MED ORDER — PRAVASTATIN SODIUM 20 MG PO TABS: 20.0000 mg | ORAL_TABLET | Freq: Every day | ORAL | 3 refills | Status: DC

## 2023-07-20 MED ORDER — LIOTHYRONINE SODIUM 5 MCG PO TABS: 5.0000 ug | ORAL_TABLET | Freq: Every day | ORAL | 3 refills | Status: DC

## 2023-07-20 MED ORDER — LEVOTHYROXINE SODIUM 88 MCG PO TABS: 88.0000 ug | ORAL_TABLET | Freq: Every day | ORAL | 3 refills | Status: DC

## 2023-07-20 MED ORDER — VALSARTAN 80 MG PO TABS: 80.0000 mg | ORAL_TABLET | Freq: Every day | ORAL | 0 refills | Status: DC

## 2023-07-20 MED ORDER — PRAMIPEXOLE DIHYDROCHLORIDE 0.75 MG PO TABS: 0.7500 mg | ORAL_TABLET | Freq: Every day | ORAL | 3 refills | Status: DC

## 2023-07-20 NOTE — Progress Notes (Unsigned)
Subjective:    Patient ID: Sherry Oliver, female    DOB: 07-13-1961, 62 y.o.   MRN: [id number]  Sherry Oliver is a 62 y.o. female presenting on 07/20/2023 for Annual Exam   HPI  Here for Annual Physical and Lab Review  Insomnia She reports Sleep problem "Revenge sleep procrastination" stays awake more at night and less tired because she has time to herself at that point.  Urinary Urgency / Pelvic Pressure Persistent problem since last visit. Reports recent issue with urinary frequency urgency Last urinalysis urine culture negative for infection No dysuria or blood or fever chills Admits frequency. She is on hydrochlorothiazide Not on medicine for bladder or has not seen Urologist  Elevated Creatinine Recent lab showed elevated Creatinine 1.12, eGFR 55 reduced, compared to previous results >2+ years. Admits some reduced fluid intake recently Not taking NSAID PreDM level of blood sugar.   CHRONIC HTN: Reports occasional home BP checks. Has intermittent elevation, admits stressors Current Meds - HCTZ 25mg  daily - Off Amlodipine due to swelling Lifestyle: - Diet: admits some sodium / caffeine in diet, not always hydrating - Exercise: not as active due to knee pain Denies CP, dyspnea, HA, edema, dizziness / lightheadedness   Hypothyroidism Chronic problem for >30 years. History of temperature instability and lab test showed problem. Last lab TSH and T4 normal. Currently taking Levothyroxine daily and Cytomel 5mg  daily, no change in dose and no symptoms.   HYPERLIPIDEMIA: Dramatic improved cholesterol TC 189 (below 200), HDL 52, TG 161, and LDL 109. 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 Often will eat a sweet snack etc. - Exercise (Limited by by exercise due to knee pain) Denies hypoglycemia, polyuria, visual changes, numbness or tingling   GERD Taking Nexium 20mg  OTC BID    Former Smoker   OSA on CPAP Pulm [facility] Dr Karna Christmas, now on new CPAP machine, upgraded from 2011 Improved on CPAP, using nightly, benefits from therapy     Additional update Mother has been diagnosed with bladder cancer again and she will start chemo again and Sherry Oliver has had some increased stress with this. She is primary caregiver  Maternal Fam history heart disease   Health Maintenance: Flu Shot today  Colon CA Screening - Last Colonoscopy Dr Marva Panda 2019, no polyps. [facility]. Repeat 5 year, now due, interested in 1st try at [facility].  Mammogram 11/2022 completed.     07/20/2023    1:29 PM 01/11/2023    2:38 PM 06/05/2022    2:38 PM  Depression screen PHQ 2/9  Decreased Interest 1 1 1   Down, Depressed, Hopeless 1 1 1   PHQ - 2 Score 2 2 2   Altered sleeping 2 1 2   Tired, decreased energy 2 1 2   Change in appetite 1 0 0  Feeling bad or failure about yourself  1 0 0  Trouble concentrating 2 1 1   Moving slowly or fidgety/restless 0 0 0  Suicidal thoughts 0 0 0  PHQ-9 Score 10 5 7   Difficult doing work/chores Somewhat difficult  Somewhat difficult    Past Medical History:  Diagnosis Date   Allergy    Gastropathy    GERD (gastroesophageal reflux disease)    Headache    Hyperlipidemia    Hypertension    Hypothyroid    Lumbar radiculopathy    Lung nodule    Sleep apnea    Past Surgical History:  Procedure Laterality Date   APPENDECTOMY  2008   BLADDER SURGERY  1965   bladder stem   BREAST BIOPSY Left 06/06/2019   Lymph node US Bx, hydromarker.  REACTIVE LYMPH NODE, FAVOR BENIGN   COLONOSCOPY WITH PROPOFOL N/A 02/19/2015   Procedure: COLONOSCOPY WITH PROPOFOL;  Surgeon: Christena Deem, MD;  Location: [facility] ENDOSCOPY;  Service: Endoscopy;  Laterality: N/A;   COLONOSCOPY WITH PROPOFOL N/A 03/24/2018   Procedure: COLONOSCOPY WITH PROPOFOL;  Surgeon: Christena Deem, MD;  Location: [facility] ENDOSCOPY;  Service: Endoscopy;  Laterality: N/A;   ELBOW SURGERY Right 1982   ESOPHAGOGASTRODUODENOSCOPY     FOOT SURGERY Left 2007   neuroma removal, repeat 2008   OPEN REDUCTION INTERNAL FIXATION (ORIF) DISTAL RADIAL FRACTURE Right 03/24/2022   Procedure: ORIF right distal radius fracture;  Surgeon: Kennedy Bucker, MD;  Location: [facility] ORS;  Service: Orthopedics;  Laterality: Right;   Social History   Socioeconomic History   Marital status: Single    Spouse name: Not on file   Number of children: Not on file   Years of education: Not on file   Highest education level: Not on file  Occupational History   Not on file  Tobacco Use   Smoking status: Former    Current packs/day: 0.00    Average packs/day: 1 pack/day for 54.0 years (54.0 ttl pk-yrs)    Types: Cigarettes    Quit date: 03/14/2018    Years since quitting: 5.3   Smokeless tobacco: Former  Building services engineer status: Never Used  Substance and Sexual Activity   Alcohol use: Yes    Comment: rare   Drug use: Not Currently   Sexual activity: Not on file  Other Topics Concern   Not on file  Social History Narrative   Not on file   Social Determinants of Health   Financial Resource Strain: Not on file  Food Insecurity: No Food Insecurity (05/25/2022)   Hunger Vital Sign    Worried About Running Out of Food in the Last Year: Never true    Ran Out of Food in the Last Year: Never true  Transportation Needs: No Transportation Needs (05/25/2022)   PRAPARE - Administrator, Civil Service (Medical): No    Lack of Transportation (Non-Medical): No  Physical Activity: Not on  file  Stress: Not on file  Social Connections: Moderately Isolated (05/25/2022)   Social Connection and Isolation Panel [NHANES]    Frequency of Communication with Friends and Family: Twice a week    Frequency of Social Gatherings with Friends and Family: Once a week    Attends Religious Services: Never    Database administrator or Organizations: Yes    Attends Engineer, structural: 1 to 4 times per year    Marital Status: Never married  Catering manager Violence: Not on file   Family History  Problem Relation Age of Onset   Breast cancer Mother 50   BRCA 1/2 Mother        positive for Brca 2   Cancer Mother        bladder  Breast cancer Maternal Aunt 58   Breast cancer Maternal Grandfather        70's   Alcohol abuse Father    Lung cancer Father 60       mets to brain   Heart disease Maternal Grandmother    Stroke Paternal Grandmother    Current Outpatient Medications on File Prior to Visit  Medication Sig   Ascorbic Acid (VITAMIN C) 1000 MG tablet Take 1,000 mg by mouth daily.   aspirin 81 MG chewable tablet Chew 1 tablet (81 mg total) by mouth daily.   b complex vitamins tablet Take 1 tablet by mouth daily.   Calcium Carbonate-Vitamin D 600-400 MG-UNIT per tablet Take 1 tablet by mouth daily.   clotrimazole-betamethasone (LOTRISONE) cream Apply topically 2 (two) times daily.   desoximetasone (TOPICORT) 0.25 % cream Apply to affected areas one to two times daily as needed until rash improved. Avoid applying to face, groin, and axilla.   Dupilumab (DUPIXENT) 300 MG/2ML SOPN INJECT 300 MG UNDER THE SKIN EVERY 14 DAYS   esomeprazole (NEXIUM) 20 MG capsule Take 20 mg by mouth in the morning and at bedtime.   hydrochlorothiazide (HYDRODIURIL) 25 MG tablet TAKE 1 TABLET DAILY   ibuprofen (ADVIL,MOTRIN) 200 MG tablet Take 400 mg by mouth 2 (two) times daily.   Multiple Vitamin (MULTIVITAMIN WITH MINERALS) TABS tablet Take 1 tablet by mouth daily.   tacrolimus (PROTOPIC)  0.1 % ointment Apply topically daily.   zinc sulfate 220 (50 Zn) MG capsule Take 220 mg by mouth daily.   No current facility-administered medications on file prior to visit.    Review of Systems Per HPI unless specifically indicated above      Objective:    BP (!) 142/80   Pulse 96   Ht 5' 0.5" (1.537 m)   Wt 161 lb 3.2 oz (73.1 kg)   SpO2 96%   BMI 30.96 kg/m   Wt Readings from Last 3 Encounters:  07/20/23 161 lb 3.2 oz (73.1 kg)  06/02/23 155 lb (70.3 kg)  01/11/23 161 lb (73 kg)    Physical Exam Vitals and nursing note reviewed.  Constitutional:      General: She is not in acute distress.    Appearance: She is well-developed. She is not diaphoretic.     Comments: Well-appearing, comfortable, cooperative  HENT:     Head: Normocephalic and atraumatic.  Eyes:     General:        Right eye: No discharge.        Left eye: No discharge.     Conjunctiva/sclera: Conjunctivae normal.     Pupils: Pupils are equal, round, and reactive to light.  Neck:     Thyroid: No thyromegaly.     Vascular: No carotid bruit.  Cardiovascular:     Rate and Rhythm: Normal rate and regular rhythm.     Pulses: Normal pulses.     Heart sounds: Normal heart sounds. No murmur heard. Pulmonary:     Effort: Pulmonary effort is normal. No respiratory distress.     Breath sounds: Normal breath sounds. No wheezing or rales.  Abdominal:     General: Bowel sounds are normal. There is no distension.     Palpations: Abdomen is soft. There is no mass.     Tenderness: There is no abdominal tenderness.  Musculoskeletal:        General: No tenderness. Normal range of motion.     Cervical back: Normal range of motion and neck  supple.     Right lower leg: No edema.     Left lower leg: No edema.     Comments: Upper / Lower Extremities: - Normal muscle tone, strength bilateral upper extremities 5/5, lower extremities 5/5  Lymphadenopathy:     Cervical: No cervical adenopathy.  Skin:    General: Skin  is warm and dry.     Findings: No erythema or rash.  Neurological:     Mental Status: She is alert and oriented to person, place, and time.     Comments: Distal sensation intact to light touch all extremities  Psychiatric:        Mood and Affect: Mood normal.        Behavior: Behavior normal.        Thought Content: Thought content normal.     Comments: Well groomed, good eye contact, normal speech and thoughts     I have personally reviewed the radiology report from 06/16/23 on LDCT.  CLINICAL DATA:  Former smoker, quit 2019, 54 pack-year history.   EXAM: CT CHEST WITHOUT CONTRAST LOW-DOSE FOR LUNG CANCER SCREENING   TECHNIQUE: Multidetector CT imaging of the chest was performed following the standard protocol without IV contrast.   RADIATION DOSE REDUCTION: This exam was performed according to the departmental dose-optimization program which includes automated exposure control, adjustment of the mA and/or kV according to patient size and/or use of iterative reconstruction technique.   COMPARISON:  11/17/2019.   FINDINGS: Cardiovascular: Atherosclerotic calcification of the aorta. Heart size normal. No pericardial effusion.   Mediastinum/Nodes: No pathologically enlarged mediastinal or axillary lymph nodes. Hilar regions are difficult to definitively evaluate without IV contrast. Esophagus is grossly unremarkable.   Lungs/Pleura: Centrilobular and paraseptal emphysema. Mild cylindrical bronchiectasis. Bibasilar scarring. Pulmonary nodules measure 5.6 mm or less in size. No pleural fluid. Airway is unremarkable.   Upper Abdomen: Visualized portion of the liver is unremarkable. Gallstones. Visualized portions of the adrenal glands, kidneys, spleen, pancreas, stomach and bowel are grossly unremarkable. No upper abdominal adenopathy.   Musculoskeletal: Degenerative changes in the spine.   IMPRESSION: 1. Lung-RADS 2, benign appearance or behavior. Continue  annual screening with low-dose chest CT without contrast in 12 months. 2. Mild cylindrical bronchiectasis. 3. Cholelithiasis. 4.  Aortic atherosclerosis (ICD10-I70.0). 5.  Emphysema (ICD10-J43.9).     Electronically Signed   By: Leanna Battles M.D.   On: 06/16/2023 08:07  Results for orders placed or performed in visit on 07/13/23  T4, free  Result Value Ref Range   Free T4 1.0 0.8 - 1.8 ng/dL  TSH  Result Value Ref Range   TSH 1.43 0.40 - 4.50 mIU/L  Lipid panel  Result Value Ref Range   Cholesterol 189 <200 mg/dL   HDL 52 > OR = 50 mg/dL   Triglycerides 295 (H) <150 mg/dL   LDL Cholesterol (Calc) 109 (H) mg/dL (calc)   Total CHOL/HDL Ratio 3.6 <5.0 (calc)   Non-HDL Cholesterol (Calc) 137 (H) <130 mg/dL (calc)  Hemoglobin A2Z  Result Value Ref Range   Hgb A1c MFr Bld 6.2 (H) <5.7 % of total Hgb   Mean Plasma Glucose 131 mg/dL   eAG (mmol/L) 7.3 mmol/L  CBC with Differential/Platelet  Result Value Ref Range   WBC 6.7 3.8 - 10.8 Thousand/uL   RBC 4.41 3.80 - 5.10 Million/uL   Hemoglobin 12.9 11.7 - 15.5 g/dL   HCT 30.8 65.7 - 84.6 %   MCV 92.3 80.0 - 100.0 fL   MCH 29.3 27.0 -  33.0 pg   MCHC 31.7 (L) 32.0 - 36.0 g/dL   RDW 16.1 09.6 - 04.5 %   Platelets 414 (H) 140 - 400 Thousand/uL   MPV 11.9 7.5 - 12.5 fL   Neutro Abs 4,355 1,500 - 7,800 cells/uL   Absolute Lymphocytes 1,240 850 - 3,900 cells/uL   Absolute Monocytes 784 200 - 950 cells/uL   Eosinophils Absolute 261 15 - 500 cells/uL   Basophils Absolute 60 0 - 200 cells/uL   Neutrophils Relative % 65 %   Total Lymphocyte 18.5 %   Monocytes Relative 11.7 %   Eosinophils Relative 3.9 %   Basophils Relative 0.9 %  COMPLETE METABOLIC PANEL WITH GFR  Result Value Ref Range   Glucose, Bld 110 (H) 65 - 99 mg/dL   BUN 23 7 - 25 mg/dL   Creat 4.09 (H) 8.11 - 1.05 mg/dL   eGFR 55 (L) > OR = 60 mL/min/1.23m2   BUN/Creatinine Ratio 21 6 - 22 (calc)   Sodium 142 135 - 146 mmol/L   Potassium 4.2 3.5 - 5.3 mmol/L    Chloride 103 98 - 110 mmol/L   CO2 30 20 - 32 mmol/L   Calcium 9.9 8.6 - 10.4 mg/dL   Total Protein 7.5 6.1 - 8.1 g/dL   Albumin 4.3 3.6 - 5.1 g/dL   Globulin 3.2 1.9 - 3.7 g/dL (calc)   AG Ratio 1.3 1.0 - 2.5 (calc)   Total Bilirubin 0.3 0.2 - 1.2 mg/dL   Alkaline phosphatase (APISO) 69 37 - 153 U/L   AST 15 10 - 35 U/L   ALT 17 6 - 29 U/L      Assessment & Plan:   Problem List Items Addressed This Visit     Acquired hypothyroidism   Relevant Medications   levothyroxine (SYNTHROID) 88 MCG tablet   liothyronine (CYTOMEL) 5 MCG tablet   Centrilobular emphysema (HCC)   Essential hypertension   Relevant Medications   pravastatin (PRAVACHOL) 20 MG tablet   valsartan (DIOVAN) 80 MG tablet   History of cerebrovascular accident (CVA) with residual deficit   Relevant Medications   pravastatin (PRAVACHOL) 20 MG tablet   Mixed hyperlipidemia   Relevant Medications   pravastatin (PRAVACHOL) 20 MG tablet   valsartan (DIOVAN) 80 MG tablet   OSA on CPAP   Restless leg syndrome   Relevant Medications   pramipexole (MIRAPEX) 0.75 MG tablet   Other Visit Diagnoses     Annual physical exam    -  Primary   Need for influenza vaccination       Relevant Orders   Flu vaccine trivalent PF, 6mos and older(Flulaval,Afluria,Fluarix,Fluzone) (Completed)   Screening for colon cancer       Relevant Orders   Cologuard       Updated Health Maintenance information Reviewed recent lab results with patient Encouraged improvement to lifestyle with diet and exercise Goal of weight loss   Urinary Frequency and Urgency No evidence of UTI. Likely functional bladder issue, possibly overactive bladder. Currently on hydrochlorothiazide, a diuretic, for hypertension. -Consider reducing or discontinuing hydrochlorothiazide to assess impact on urinary symptoms, recommend pause for a few days to see response. - If successful reduction in urinary symptoms, START New med ARB Valsartan 80mg  daily instead  of hydrochlorothiazide. - If no change in urinary symptoms, may choose to restart hydrochlorothiazide or switch to Valsartan Consider Urologist in future  Hypertension Currently managed with hydrochlorothiazide. Considering discontinuation due to urinary symptoms. -Consider switching to losartan 80mg  daily for blood  pressure control and kidney protection. Printed rx as back up plan -Check blood pressure and kidney function in 3 months.  Hyperlipidemia Significant improvement in lipid panel, likely due to dietary changes. Currently on pravastatin. -Continue pravastatin and current diet.  Prediabetes A1c 6.2 stable -Continue current management, no need for medication at this time.  Epistaxis Recurrent nosebleeds, possibly due to dryness. -Consider using Afrin nasal spray acutely during nosebleeds to constrict blood vessels and stop bleeding.  General Health Maintenance -Flu shot administered. -Pneumonia shot (Prevnar 20) not due until next year 65+ -Mammogram completed in March 2024. -Lung cancer screening completed in September 2024. -Follow-up in 6 months for routine check and repeat labs.     Due for routine colon cancer screening. Last colonoscopy 5 yr ago no polyps - Discussion today about recommendations for either Colonoscopy or Cologuard screening, benefits and risks of screening, interested in Cologuard, understands that if positive then recommendation is for diagnostic colonoscopy to follow-up. - Ordered Cologuard today  Updated chart. She already had Coronary Calcium CT Score 10/28/21. Low/minimnal score CAC 0.45, 59th percentile. She does not need to repeat it in 2024 or 2025.   Orders Placed This Encounter  Procedures   Flu vaccine trivalent PF, 6mos and older(Flulaval,Afluria,Fluarix,Fluzone)   Cologuard      Meds ordered this encounter  Medications   levothyroxine (SYNTHROID) 88 MCG tablet    Sig: Take 1 tablet (88 mcg total) by mouth daily before breakfast.     Dispense:  90 tablet    Refill:  3    Add future refills   liothyronine (CYTOMEL) 5 MCG tablet    Sig: Take 1 tablet (5 mcg total) by mouth daily.    Dispense:  90 tablet    Refill:  3    Add future refills on file   pramipexole (MIRAPEX) 0.75 MG tablet    Sig: Take 1 tablet (0.75 mg total) by mouth at bedtime.    Dispense:  90 tablet    Refill:  3   pravastatin (PRAVACHOL) 20 MG tablet    Sig: Take 1 tablet (20 mg total) by mouth daily.    Dispense:  90 tablet    Refill:  3    Add refills on file   valsartan (DIOVAN) 80 MG tablet    Sig: Take 1 tablet (80 mg total) by mouth daily.    Dispense:  30 tablet    Refill:  0     Follow up plan: Return in about 4 months (around 11/17/2023) for 4 month non fasting lab then 1 week later Follow-up HTN, Urinary, Kidney function, PreDM.  4 month - BMET  A1c  Saralyn Pilar, DO [facility] 07/20/2023, 1:29 PM

## 2023-07-20 NOTE — Patient Instructions (Addendum)
Thank you for coming to the office today.   Check with this office for the referral status from Pulmonology.  [facility] [address] [place], [place] [address] Phone: [phone]  -----------   Urinary symptoms discussed today. I believe it could be urinary bladder with possible overactive bladder or other functional bladder issues. It could be caused by the hydrochlorothiazide as well with this diuretic medication causing you to go more frequently.  Try to PAUSE the hydrochlorothiazide 25mg  for a few days and see how you do with the urination.  I have printed Valsartan 80mg  daily to use instead as a new BP medication.  Let me know how this process goes. If you ultimately still need help with bladder I can refer to Urologist  --------------  Ordered the Cologuard (home kit) test for colon cancer screening. Stay tuned for further updates.  It will be shipped to you directly. If not received in 2-4 weeks, call us or the company.   If you send it back and no results are received in 2-4 weeks, call us or the company as well!   Colon Cancer Screening: - For all adults age 64+ routine colon cancer screening is highly recommended.     - Recent guidelines from American Cancer Society recommend starting age of 83 - Early detection of colon cancer is important, because often there are no warning signs or symptoms, also if found early usually it can be cured. Late stage is hard to treat.   - If Cologuard is NEGATIVE, then it is good for 3 years before next due - If Cologuard is POSITIVE, then it is strongly advised to get a Colonoscopy, which allows the GI doctor to locate the source of the cancer or polyp (even very early stage) and treat it by removing it. ------------------------- Follow instructions to collect sample, you may call the company for any help or questions, 24/7 telephone support at [phone].   Prevnar-20 (new pneumonia  vaccine, 2 in 1 shot, due age 68+ here or at the pharmacy)  Please schedule a Follow-up Appointment to: Return in about 4 months (around 11/17/2023) for 4 month non fasting lab then 1 week later Follow-up HTN, Urinary, Kidney function, PreDM.  If you have any other questions or concerns, please feel free to call the office or send a message through MyChart. You may also schedule an earlier appointment if necessary.  Additionally, you may be receiving a survey about your experience at our office within a few days to 1 week by e-mail or mail. We value your feedback.  Saralyn Pilar, DO [facility], [place]

## 2023-07-30 DIAGNOSIS — Z1211 Encounter for screening for malignant neoplasm of colon: Secondary | ICD-10-CM | POA: Diagnosis not present

## 2023-08-04 ENCOUNTER — Ambulatory Visit: Payer: BC Managed Care – PPO | Admitting: Dermatology

## 2023-08-04 ENCOUNTER — Other Ambulatory Visit: Payer: Self-pay | Admitting: Family Medicine

## 2023-08-04 DIAGNOSIS — B070 Plantar wart: Secondary | ICD-10-CM

## 2023-08-04 DIAGNOSIS — I10 Essential (primary) hypertension: Secondary | ICD-10-CM

## 2023-08-04 DIAGNOSIS — Q828 Other specified congenital malformations of skin: Secondary | ICD-10-CM

## 2023-08-04 NOTE — Progress Notes (Signed)
Follow-Up Visit   Subjective  Sherry Oliver is a 62 y.o. 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 She has not treated areas since last visit, not as painful to walk on, but still sore. She has previously had areas treated with cryotherapy and Mediplast pads. Patient feels like cryotherapy has helped areas the most.    The following portions of the chart were reviewed this encounter and updated as appropriate: medications, allergies, medical history  Review of Systems:  No other skin or systemic complaints except as noted in HPI or Assessment and Plan.  Objective  Well appearing patient in no apparent distress; mood and affect are within normal limits.  A focused examination was performed of the following areas: Feet  Relevant physical exam findings are noted in the Assessment and Plan.  R plantar great toe x 1, R plantar mid ball of foot x 1, R med heel x 4, R lat plantar foot below 5th toe x 1, L plantar foot at ball x 1, L plantar foot below 5th toe x 1 (9) Keratotic papules.    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 Symptomatic - pain with walking.   Paring performed followed by cryotherapy and Cantharidin Plus.  Once healed, Recommend using Curad Mediplast pads. Cut to fit wart or callus. Cover with Elastoplast waterproof tape or any waterproof band-aid. Change every 3 to 4 days, or sooner if necessary.  Treatment may require several months of regular use before results are seen.   Destruction of lesion - R plantar great toe x 1, R plantar mid ball of foot x 1, R med heel x 4, R lat plantar foot below 5th toe x 1, L plantar foot at ball x 1, L plantar foot below 5th toe x 1 (9)  Destruction  method: cryotherapy   Informed consent: discussed and consent obtained   Debridement: hyperkeratotic portion removed with sharp debridement   Debridement comment:  All lesions pared Lesion destroyed using liquid nitrogen: Yes   Region frozen until ice ball extended beyond lesion: Yes   Outcome: patient tolerated procedure well with no complications   Post-procedure details: wound care instructions given   Additional details:  Prior to procedure, discussed risks of blister formation, small wound, skin dyspigmentation, or rare scar following cryotherapy. Recommend Vaseline ointment to treated areas while healing.   Destruction of lesion - R plantar great toe x 1, R plantar mid ball of foot x 1, R med heel x 4, R lat plantar foot below 5th toe x 1, L plantar foot at ball x 1, L plantar foot below 5th toe x 1 (9)  Destruction method: chemical removal   Informed consent: discussed and consent obtained   Timeout:  patient name, date of birth, surgical site, and procedure verified Chemical destruction method comment:  Cantharidin Plus Procedure instructions: patient instructed to wash and dry area   Outcome: patient tolerated procedure well with no complications   Post-procedure details: wound care instructions given   Additional details:  Cantharidin Plus is a blistering agent that comes from a beetle.  It needs to be washed off in about 4-6 hours after application.  Although  it is painless when applied in office, it may cause symptoms of mild pain and burning several hours later.  Treated areas will swell and turn red, and blisters may form.  Vaseline and a bandaid may be applied until wound has healed.  Once healed, the skin may remain temporarily discolored.  It can take weeks to months for pigmentation to return to normal.  Advised to wash off with soap and water in 4-6 hours or sooner if it becomes tender before then.      Return as scheduled.  ICherlyn Labella, CMA, am acting as scribe for  Willeen Niece, MD .   Documentation: I have reviewed the above documentation for accuracy and completeness, and I agree with the above.  Willeen Niece, MD

## 2023-08-04 NOTE — Patient Instructions (Addendum)
Cantharidin Plus is a blistering agent that comes from a beetle.  It needs to be washed off in about 6 hours after application.  Although it is painless when applied in office, it may cause symptoms of mild pain and burning several hours later.  Treated areas will swell and turn red, and blisters may form.  Vaseline and a bandaid may be applied until wound has healed.  Once healed, the skin may remain temporarily discolored.  It can take weeks to months for pigmentation to return to normal.    Recommend using Curad Mediplast pads. Cut to fit wart or callus. Cover with Elastoplast waterproof tape or any waterproof band-aid. Change every 3 to 4 days, or sooner if necessary.  Treatment may require several months of regular use before results are seen.   Cryotherapy Aftercare  Wash gently with soap and water everyday.   Apply Vaseline and Band-Aid daily until healed.       Due to recent changes in healthcare laws, you may see results of your pathology and/or laboratory studies on MyChart before the doctors have had a chance to review them. We understand that in some cases there may be results that are confusing or concerning to you. Please understand that not all results are received at the same time and often the doctors may need to interpret multiple results in order to provide you with the best plan of care or course of treatment. Therefore, we ask that you please give [place] 2 business days to thoroughly review all your results before contacting the office for clarification. Should we see a critical lab result, you will be contacted sooner.   If You Need Anything After Your Visit  If you have any questions or concerns for your doctor, please call our main line at [phone] and press option 4 to reach your doctor's medical assistant. If no one answers, please leave a voicemail as directed and we will return your call as soon as possible. Messages left after 4 pm will be answered the following  business day.   You may also send [place] a message via MyChart. We typically respond to MyChart messages within 1-2 business days.  For prescription refills, please ask your pharmacy to contact our office. Our fax number is [phone].  If you have an urgent issue when the clinic is closed that cannot wait until the next business day, you can page your doctor at the number below.    Please note that while we do our best to be available for urgent issues outside of office hours, we are not available 24/7.   If you have an urgent issue and are unable to reach [place], you may choose to seek medical care at your doctor's office, retail clinic, urgent care center, or emergency room.  If you have a medical emergency, please immediately call 911 or go to the emergency department.  Pager Numbers  - Dr. Gwen Pounds: [phone]  - Dr. Roseanne Reno: [phone]  - Dr. Katrinka Blazing: [phone]   In the event of inclement weather, please call our main line at [phone] for an update on the status of any delays or closures.  Dermatology Medication Tips: Please keep the boxes that topical medications come in in order to help keep track of the instructions about where and how to use these. Pharmacies typically print the medication instructions only on the boxes and not directly on the medication tubes.   If your medication is too expensive, please contact our office at [phone] option  4 or send [place] a message through MyChart.   We are unable to tell what your co-pay for medications will be in advance as this is different depending on your insurance coverage. However, we may be able to find a substitute medication at lower cost or fill out paperwork to get insurance to cover a needed medication.   If a prior authorization is required to get your medication covered by your insurance company, please allow [place] 1-2 business days to complete this process.  Drug prices often vary depending on where the prescription is  filled and some pharmacies may offer cheaper prices.  The website www.goodrx.com contains coupons for medications through different pharmacies. The prices here do not account for what the cost may be with help from insurance (it may be cheaper with your insurance), but the website can give you the price if you did not use any insurance.  - You can print the associated coupon and take it with your prescription to the pharmacy.  - You may also stop by our office during regular business hours and pick up a GoodRx coupon card.  - If you need your prescription sent electronically to a different pharmacy, notify our office through [facility] or by phone at [phone] option 4.     Si Usted Necesita Algo Despus de Su Visita  Tambin puede enviarnos un mensaje a travs de Clinical cytogeneticist. Por lo general respondemos a los mensajes de MyChart en el transcurso de 1 a 2 das hbiles.  Para renovar recetas, por favor pida a su farmacia que se ponga en contacto con nuestra oficina. Annie Sable de fax es [place] [phone].  Si tiene un asunto urgente cuando la clnica est cerrada y que no puede esperar hasta el siguiente da hbil, puede llamar/localizar a su doctor(a) al nmero que aparece a continuacin.   Por favor, tenga en cuenta que aunque hacemos todo lo posible para estar disponibles para asuntos urgentes fuera del horario de [place], no estamos disponibles las 24 horas del da, los 7 [address]  [address] de la [place].   Si tiene un problema urgente y no puede comunicarse con nosotros, puede optar por buscar atencin mdica  en el consultorio de su doctor(a), en una clnica privada, en un centro de atencin urgente o en una sala de emergencias.  Si tiene Engineer, drilling, por favor llame inmediatamente al 911 o vaya a la sala de emergencias.  Nmeros de bper  - Dr. Gwen Pounds: [phone]  - Dra. Roseanne Reno: [phone]  - Dr. Katrinka Blazing: [phone]   En caso de inclemencias del tiempo, por favor  llame a Lacy Duverney principal al [phone] para una actualizacin sobre el [place] de cualquier retraso o cierre.  Consejos para la medicacin en dermatologa: Por favor, guarde las cajas en las que vienen los medicamentos de uso tpico para ayudarle a seguir las instrucciones sobre dnde y cmo usarlos. Las farmacias generalmente imprimen las instrucciones del medicamento slo en las cajas y no directamente en los tubos del [place].   Si su medicamento es muy caro, por favor, pngase en contacto con Rolm Gala llamando al [phone] y presione la opcin 4 o envenos un mensaje a travs de Clinical cytogeneticist.   No podemos decirle cul ser su copago por los medicamentos por adelantado ya que esto es diferente dependiendo de la cobertura de su seguro. Sin embargo, es posible que podamos encontrar un medicamento sustituto a Audiological scientist un formulario para que el seguro cubra el medicamento que se considera necesario.  Si se requiere una autorizacin previa para que su compaa de seguros [place] su medicamento, por favor permtanos de 1 a 2 das hbiles para completar [address]  Los precios de los medicamentos varan con frecuencia dependiendo del Environmental consultant de dnde se surte la receta y alguna farmacias pueden ofrecer precios ms baratos.  El sitio web www.goodrx.com tiene cupones para medicamentos de Health and safety inspector. Los precios aqu no tienen en cuenta lo que podra costar con la ayuda del seguro (puede ser ms barato con su seguro), pero el sitio web puede darle el precio si no utiliz Tourist information centre manager.  - Puede imprimir el cupn correspondiente y llevarlo con su receta a la farmacia.  - Tambin puede pasar por nuestra oficina durante el horario de atencin regular y Education officer, museum una tarjeta de cupones de GoodRx.  - Si necesita que su receta se enve electrnicamente a una farmacia diferente, informe a nuestra oficina a travs de MyChart de [place] o por telfono llamando al [phone]  y presione la opcin 4.

## 2023-08-05 NOTE — Telephone Encounter (Signed)
Requested Prescriptions  Pending Prescriptions Disp Refills   hydrochlorothiazide (HYDRODIURIL) 25 MG tablet [Pharmacy Med Name: HYDROCHLOROTHIAZIDE TABS 25MG ] 90 tablet 0    Sig: TAKE 1 TABLET DAILY     Cardiovascular: Diuretics - Thiazide Failed - 08/04/2023  1:17 AM      Failed - Cr in normal range and within 180 days    Creat  Date Value Ref Range Status  07/13/2023 1.12 (H) 0.50 - 1.05 mg/dL Final         Passed - K in normal range and within 180 days    Potassium  Date Value Ref Range Status  07/13/2023 4.2 3.5 - 5.3 mmol/L Final         Passed - Na in normal range and within 180 days    Sodium  Date Value Ref Range Status  07/13/2023 142 135 - 146 mmol/L Final         Passed - Last BP in normal range    BP Readings from Last 1 Encounters:  07/20/23 138/80         Passed - Valid encounter within last 6 months    Recent Outpatient Visits           2 weeks ago Annual physical exam   [place] [facility] Smitty Cords, DO   6 months ago Acquired hypothyroidism   [place] [facility] Smitty Cords, DO   1 year ago Annual physical exam   [place] [facility] Smitty Cords, DO   1 year ago Right carpal tunnel syndrome   [place] [facility] Smitty Cords, DO   2 years ago Annual physical exam   [place] [facility] Smitty Cords, DO       Future Appointments             In 2 months Willeen Niece, MD [facility]   In 3 months Althea Charon, Netta Neat, DO [place] [facility]

## 2023-08-08 LAB — COLOGUARD: COLOGUARD: NEGATIVE

## 2023-08-30 DIAGNOSIS — G4733 Obstructive sleep apnea (adult) (pediatric): Secondary | ICD-10-CM | POA: Diagnosis not present

## 2023-08-30 DIAGNOSIS — J342 Deviated nasal septum: Secondary | ICD-10-CM | POA: Diagnosis not present

## 2023-08-30 DIAGNOSIS — R040 Epistaxis: Secondary | ICD-10-CM | POA: Diagnosis not present

## 2023-09-16 DIAGNOSIS — R040 Epistaxis: Secondary | ICD-10-CM | POA: Diagnosis not present

## 2023-09-16 DIAGNOSIS — J342 Deviated nasal septum: Secondary | ICD-10-CM | POA: Diagnosis not present

## 2023-09-30 DIAGNOSIS — G4733 Obstructive sleep apnea (adult) (pediatric): Secondary | ICD-10-CM | POA: Diagnosis not present

## 2023-10-11 ENCOUNTER — Ambulatory Visit: Payer: BC Managed Care – PPO | Admitting: Dermatology

## 2023-10-11 DIAGNOSIS — L2089 Other atopic dermatitis: Secondary | ICD-10-CM

## 2023-10-11 DIAGNOSIS — Z79899 Other long term (current) drug therapy: Secondary | ICD-10-CM

## 2023-10-11 DIAGNOSIS — L209 Atopic dermatitis, unspecified: Secondary | ICD-10-CM

## 2023-10-11 MED ORDER — DUPIXENT 300 MG/2ML ~~LOC~~ SOAJ: SUBCUTANEOUS | 1 refills | Status: DC

## 2023-10-11 NOTE — Patient Instructions (Signed)

## 2023-10-11 NOTE — Progress Notes (Signed)
   Follow-Up Visit   Subjective  Sherry Oliver is a 63 y.o. female who presents for the following: Atopic dermatitis, arms, hands, feet, Dupixent sq injections qowk (no injection site reactions, no side effects), Tacrolimus 0.1% oint prn, Doing well on treatment.    The following portions of the chart were reviewed this encounter and updated as appropriate: medications, allergies, medical history  Review of Systems:  No other skin or systemic complaints except as noted in HPI or Assessment and Plan.  Objective  Well appearing patient in no apparent distress; mood and affect are within normal limits.   A focused examination was performed of the following areas: Hands, fingers  Relevant exam findings are noted in the Assessment and Plan.    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 Currently well-controlled.    Atopic dermatitis - Severe, on Dupixent (biologic medication).  Atopic dermatitis (eczema) is a chronic, relapsing, pruritic condition that can significantly affect quality of life. It is often associated with allergic rhinitis and/or asthma and can require treatment with topical medications, phototherapy, or in severe cases biologic medications, which require long term medication management.    Treatment Plan: Cont Dupixent 300mg /15ml sq injections qowk dsp 90 day supply 1Rf.  Cont Tacrolimus 0.1% oint qd/bid aa face, body prn flares  Recommend gentle skin care.  Continue mild soap and moisturizer after handwashing.   Dupilumab (Dupixent) is a treatment given by injection for adults and children with moderate-to-severe atopic dermatitis. Goal is control of skin condition, not cure. It is given as 2  injections at the first dose followed by 1 injection ever 2 weeks thereafter.  Young children are dosed monthly.  Potential side effects include allergic reaction, herpes infections, injection site reactions and conjunctivitis (inflammation of the eyes).  The use of Dupixent requires long term medication management, including periodic office visits.  Long term medication management.  Patient is using long term (months to years) prescription medication  to control their dermatologic condition.  These medications require periodic monitoring to evaluate for efficacy and side effects and may require periodic laboratory monitoring.   OTHER ATOPIC DERMATITIS   Related Medications tacrolimus (PROTOPIC) 0.1 % ointment Apply topically daily. Dupilumab (DUPIXENT) 300 MG/2ML SOAJ INJECT 300 MG UNDER THE SKIN EVERY 14 DAYS  Return in about 6 months (around 04/09/2024) for Atopic Derm.  ICherlyn Labella, CMA, am acting as scribe for Willeen Niece, MD .  Documentation: I have reviewed the above documentation for accuracy and completeness, and I agree with the above.  Willeen Niece, MD

## 2023-10-18 DIAGNOSIS — J342 Deviated nasal septum: Secondary | ICD-10-CM | POA: Diagnosis not present

## 2023-10-18 DIAGNOSIS — R040 Epistaxis: Secondary | ICD-10-CM | POA: Diagnosis not present

## 2023-10-31 DIAGNOSIS — G4733 Obstructive sleep apnea (adult) (pediatric): Secondary | ICD-10-CM | POA: Diagnosis not present

## 2023-11-02 ENCOUNTER — Other Ambulatory Visit: Payer: Self-pay | Admitting: Family Medicine

## 2023-11-02 DIAGNOSIS — I10 Essential (primary) hypertension: Secondary | ICD-10-CM

## 2023-11-02 NOTE — Telephone Encounter (Signed)
 Requested Prescriptions  Pending Prescriptions Disp Refills   hydrochlorothiazide (HYDRODIURIL) 25 MG tablet [Pharmacy Med Name: HYDROCHLOROTHIAZIDE TABS 25MG ] 90 tablet 0    Sig: TAKE 1 TABLET DAILY (PLEASE MAKE APPOINTMENT FOR FOLLOW UP APPOINTMENT)     Cardiovascular: Diuretics - Thiazide Failed - 11/02/2023  3:07 PM      Failed - Cr in normal range and within 180 days    Creat  Date Value Ref Range Status  07/13/2023 1.12 (H) 0.50 - 1.05 mg/dL Final         Passed - K in normal range and within 180 days    Potassium  Date Value Ref Range Status  07/13/2023 4.2 3.5 - 5.3 mmol/L Final         Passed - Na in normal range and within 180 days    Sodium  Date Value Ref Range Status  07/13/2023 142 135 - 146 mmol/L Final         Passed - Last BP in normal range    BP Readings from Last 1 Encounters:  07/20/23 138/80         Passed - Valid encounter within last 6 months    Recent Outpatient Visits           3 months ago Annual physical exam   [place] [facility] Smitty Cords, DO   9 months ago Acquired hypothyroidism   [place] [facility] Smitty Cords, DO   1 year ago Annual physical exam   [place] [facility] Smitty Cords, DO   1 year ago Right carpal tunnel syndrome   [place] [facility] Smitty Cords, DO   2 years ago Annual physical exam   [place] [facility] Smitty Cords, DO       Future Appointments             In 2 weeks Althea Charon, Netta Neat, DO [place] [facility] [facility]   In 5 months Willeen Niece, MD [facility]

## 2023-11-15 ENCOUNTER — Other Ambulatory Visit: Payer: BC Managed Care – PPO

## 2023-11-15 DIAGNOSIS — R7309 Other abnormal glucose: Secondary | ICD-10-CM

## 2023-11-15 DIAGNOSIS — I10 Essential (primary) hypertension: Secondary | ICD-10-CM | POA: Diagnosis not present

## 2023-11-15 DIAGNOSIS — R7989 Other specified abnormal findings of blood chemistry: Secondary | ICD-10-CM | POA: Diagnosis not present

## 2023-11-16 LAB — HEMOGLOBIN A1C
Hgb A1c MFr Bld: 6.3 %{Hb} — ABNORMAL HIGH (ref <5.7)
Mean Plasma Glucose: 134 mg/dL
eAG (mmol/L): 7.4 mmol/L

## 2023-11-16 LAB — BASIC METABOLIC PANEL WITH GFR
BUN: 18 mg/dL (ref 7–25)
CO2: 30 mmol/L (ref 20–32)
Calcium: 9.6 mg/dL (ref 8.6–10.4)
Chloride: 101 mmol/L (ref 98–110)
Creat: 0.78 mg/dL (ref 0.50–1.05)
Glucose, Bld: 95 mg/dL (ref 65–99)
Potassium: 3.9 mmol/L (ref 3.5–5.3)
Sodium: 140 mmol/L (ref 135–146)
eGFR: 85 mL/min/{1.73_m2} (ref >=60)

## 2023-11-17 ENCOUNTER — Other Ambulatory Visit: Payer: BC Managed Care – PPO

## 2023-11-19 ENCOUNTER — Encounter: Payer: Self-pay | Admitting: Family Medicine

## 2023-11-22 ENCOUNTER — Encounter: Payer: Self-pay | Admitting: Family Medicine

## 2023-11-22 ENCOUNTER — Ambulatory Visit: Payer: BC Managed Care – PPO | Admitting: Family Medicine

## 2023-11-22 VITALS — BP 134/82 | HR 94 | Ht 60.5 in | Wt 168.0 lb

## 2023-11-22 DIAGNOSIS — R7989 Other specified abnormal findings of blood chemistry: Secondary | ICD-10-CM

## 2023-11-22 DIAGNOSIS — Z1231 Encounter for screening mammogram for malignant neoplasm of breast: Secondary | ICD-10-CM | POA: Diagnosis not present

## 2023-11-22 DIAGNOSIS — I10 Essential (primary) hypertension: Secondary | ICD-10-CM

## 2023-11-22 DIAGNOSIS — R7309 Other abnormal glucose: Secondary | ICD-10-CM

## 2023-11-22 NOTE — Patient Instructions (Addendum)
 Thank you for coming to the office today.  Recent Labs    01/04/23 0834 07/13/23 0829 11/15/23 0837  HGBA1C 6.2* 6.2* 6.3*   Less likely to follow intermittent fasting No need to check glucose  Please schedule a Follow-up Appointment to: Return in about 4 months (around 03/23/2024) for 4 month PreDM A1c.  If you have any other questions or concerns, please feel free to call the office or send a message through MyChart. You may also schedule an earlier appointment if necessary.  Additionally, you may be receiving a survey about your experience at our office within a few days to 1 week by e-mail or mail. We value your feedback.  Saralyn Pilar, DO [facility], [place]

## 2023-11-22 NOTE — Progress Notes (Signed)
 Subjective:    Patient ID: Sherry Oliver, female    DOB: 1961-01-31, 63 y.o.   MRN: [id number]  Sherry Oliver is a 63 y.o. female presenting on 11/22/2023 for Pre-Diabetes   HPI  Discussed the use of AI scribe software for clinical note transcription with the patient, who gave verbal consent to proceed.  History of Present Illness   The patient presents for follow-up regarding kidney function and elevated blood sugar levels.      Insomnia   Elevated Creatinine Recent repeat lab, improved with Cr 0.78, eGFR 85 with improvement from prior elevated Creatinine 1.12 Now improving fluid intake hydration Improved hydration drinking about 4-5x 8oz glass of water per day now, eliminated Not taking NSAID   CHRONIC HTN: Reports occasional home BP checks. 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 Still helping her mother after long hospitalization with pulm embolus and discharged to Skilled Nursing [facility] - She has not been adhering to regular routine diet and admits some poor choices convenience foods - Now lost 4 lbs in past 2 weeks with improvement.    Former Smoker   OSA on CPAP Pulm [facility] Dr Karna Christmas, now on new CPAP machine, upgraded from 2011 Improved on CPAP, using nightly, benefits from therapy   Health Maintenance: Due for mammogram, request order [facility]     11/22/2023    1:43 PM 07/20/2023    1:29 PM 01/11/2023    2:38 PM  Depression screen PHQ 2/9  Decreased Interest 1 1 1   Down, Depressed, Hopeless 1 1 1   PHQ - 2 Score 2 2 2    Altered sleeping 1 2 1   Tired, decreased energy 2 2 1   Change in appetite 2 1 0  Feeling bad or failure about yourself  1 1 0  Trouble concentrating 2 2 1   Moving slowly or fidgety/restless 0 0 0  Suicidal thoughts 0 0 0  PHQ-9 Score 10 10 5   Difficult doing work/chores Somewhat difficult Somewhat difficult        11/22/2023    1:44 PM 07/20/2023    1:30 PM 01/11/2023    2:38 PM 06/05/2022    2:38 PM  GAD 7 : Generalized Anxiety Score  Nervous, Anxious, on Edge 1 2 1 1   Control/stop worrying 1 3 1 1   Worry too much - different things 1 1 0 0  Trouble relaxing 2 1 2 1   Restless 1 1 0 1  Easily annoyed or irritable 0 0 0 1  Afraid - awful might happen 0 0 0 0  Total GAD 7 Score 6 8 4 5   Anxiety Difficulty Somewhat difficult   Somewhat difficult    Social History   Tobacco Use   Smoking status: Former    Current packs/day: 0.00    Average packs/day: 1 pack/day for 54.0 years (54.0 ttl pk-yrs)    Types: Cigarettes    Quit date: 03/14/2018    Years since quitting: 5.6   Smokeless tobacco: Former  Building services engineer status: Never Used  Substance Use Topics  Alcohol use: Yes    Comment: rare   Drug use: Not Currently    Review of Systems Per HPI unless specifically indicated above     Objective:    BP 134/82   Pulse 94   Ht 5' 0.5" (1.537 m)   Wt 168 lb (76.2 kg)   SpO2 99%   BMI 32.27 kg/m   Wt Readings from Last 3 Encounters:  11/22/23 168 lb (76.2 kg)  07/20/23 161 lb 3.2 oz (73.1 kg)  06/02/23 155 lb (70.3 kg)    Physical Exam Vitals and nursing note reviewed.  Constitutional:      General: She is not in acute distress.    Appearance: Normal appearance. She is well-developed. She is not diaphoretic.     Comments: Well-appearing, comfortable, cooperative  HENT:     Head: Normocephalic and atraumatic.  Eyes:     General:        Right eye: No discharge.        Left eye: No discharge.     Conjunctiva/sclera: Conjunctivae normal.  Cardiovascular:      Rate and Rhythm: Normal rate.  Pulmonary:     Effort: Pulmonary effort is normal.  Skin:    General: Skin is warm and dry.     Findings: No erythema or rash.  Neurological:     Mental Status: She is alert and oriented to person, place, and time.  Psychiatric:        Mood and Affect: Mood normal.        Behavior: Behavior normal.        Thought Content: Thought content normal.     Comments: Well groomed, good eye contact, normal speech and thoughts     Results for orders placed or performed in visit on 11/15/23  Hemoglobin A1c   Collection Time: 11/15/23  8:37 AM  Result Value Ref Range   Hgb A1c MFr Bld 6.3 (H) <5.7 % of total Hgb   Mean Plasma Glucose 134 mg/dL   eAG (mmol/L) 7.4 mmol/L  BASIC METABOLIC PANEL WITH GFR   Collection Time: 11/15/23  8:37 AM  Result Value Ref Range   Glucose, Bld 95 65 - 99 mg/dL   BUN 18 7 - 25 mg/dL   Creat 4.09 8.11 - 9.14 mg/dL   eGFR 85 > OR = 60 NW/GNF/6.21H0   BUN/Creatinine Ratio SEE NOTE: 6 - 22 (calc)   Sodium 140 135 - 146 mmol/L   Potassium 3.9 3.5 - 5.3 mmol/L   Chloride 101 98 - 110 mmol/L   CO2 30 20 - 32 mmol/L   Calcium 9.6 8.6 - 10.4 mg/dL      Assessment & Plan:   Problem List Items Addressed This Visit     Elevated hemoglobin A1c - Primary   Essential hypertension   Other Visit Diagnoses       Encounter for screening mammogram for malignant neoplasm of breast       Relevant Orders   MM 3D SCREENING MAMMOGRAM BILATERAL BREAST     Elevated serum creatinine            Prediabetes HbA1c increased from 6.0 to 6.3 due to stress. Emphasized lifestyle modifications to prevent diabetes.  - Schedule follow-up HbA1c test in four months with a finger prick A1c test - Encourage lifestyle modifications, including dietary changes and weight loss. - Future reconsider medication options if indicated Metformin vs GLP1 or defer only for future if higher A1c  Elevated Creatinine Last lab showed  normalized Creatinine / eGFR  now. Normalized with improved hydration Reassurance given Repeat lab in 07/2024 with annual  HYPERTENSION Controlled on current regimen. Hydrochlorothiazide 25mg  daily Valsartan 80mg  daily No changes  General Health Maintenance She is due for a mammogram. Order placed and advised to schedule. - Schedule mammogram at [place], [address].      Orders Placed This Encounter  Procedures   MM 3D SCREENING MAMMOGRAM BILATERAL BREAST    Standing Status:   Future    Expiration Date:   11/21/2024    Reason for Exam (SYMPTOM  OR DIAGNOSIS REQUIRED):   Screening bilateral 3D Mammogram Tomo    Preferred imaging location?:   [facility]    No orders of the defined types were placed in this encounter.   Follow up plan: Return in about 4 months (around 03/23/2024) for 4 month PreDM A1c.  Saralyn Pilar, DO [facility] 11/22/2023, 1:20 PM

## 2023-11-24 ENCOUNTER — Ambulatory Visit: Payer: BC Managed Care – PPO | Admitting: Family Medicine

## 2023-11-29 DIAGNOSIS — R040 Epistaxis: Secondary | ICD-10-CM | POA: Diagnosis not present

## 2023-12-02 DIAGNOSIS — G4733 Obstructive sleep apnea (adult) (pediatric): Secondary | ICD-10-CM | POA: Diagnosis not present

## 2023-12-13 DIAGNOSIS — R0981 Nasal congestion: Secondary | ICD-10-CM | POA: Diagnosis not present

## 2023-12-13 DIAGNOSIS — R040 Epistaxis: Secondary | ICD-10-CM | POA: Diagnosis not present

## 2024-01-02 DIAGNOSIS — G4733 Obstructive sleep apnea (adult) (pediatric): Secondary | ICD-10-CM | POA: Diagnosis not present

## 2024-01-18 ENCOUNTER — Ambulatory Visit
Admission: RE | Admit: 2024-01-18 | Discharge: 2024-01-18 | Disposition: A | Discharge status: Home / Self Care | Source: Ambulatory Visit | Attending: Family Medicine | Admitting: Family Medicine

## 2024-01-18 DIAGNOSIS — Z1231 Encounter for screening mammogram for malignant neoplasm of breast: Secondary | ICD-10-CM | POA: Diagnosis not present

## 2024-01-31 ENCOUNTER — Other Ambulatory Visit: Payer: Self-pay | Admitting: Family Medicine

## 2024-01-31 DIAGNOSIS — I10 Essential (primary) hypertension: Secondary | ICD-10-CM

## 2024-02-01 DIAGNOSIS — G4733 Obstructive sleep apnea (adult) (pediatric): Secondary | ICD-10-CM | POA: Diagnosis not present

## 2024-02-02 NOTE — Telephone Encounter (Signed)
 Requested Prescriptions  Pending Prescriptions Disp Refills   hydrochlorothiazide  (HYDRODIURIL ) 25 MG tablet [Pharmacy Med Name: HYDROCHLOROTHIAZIDE  TABS 25MG ] 90 tablet 1    Sig: TAKE 1 TABLET DAILY (PLEASE MAKE APPOINTMENT FOR FOLLOW UP APPOINTMENT)     Cardiovascular: Diuretics - Thiazide Passed - 02/02/2024  8:57 AM      Passed - Cr in normal range and within 180 days    Creat  Date Value Ref Range Status  11/15/2023 0.78 0.50 - 1.05 mg/dL Final         Passed - K in normal range and within 180 days    Potassium  Date Value Ref Range Status  11/15/2023 3.9 3.5 - 5.3 mmol/L Final         Passed - Na in normal range and within 180 days    Sodium  Date Value Ref Range Status  11/15/2023 140 135 - 146 mmol/L Final         Passed - Last BP in normal range    BP Readings from Last 1 Encounters:  11/22/23 134/82         Passed - Valid encounter within last 6 months    Recent Outpatient Visits           2 months ago Elevated hemoglobin A1c   [facility] [place] Raina Bunting, DO       Future Appointments             In 2 months Artemio Larry, MD [facility]

## 2024-02-17 ENCOUNTER — Encounter: Payer: Self-pay | Admitting: Dermatology

## 2024-02-17 ENCOUNTER — Ambulatory Visit: Admitting: Dermatology

## 2024-02-17 DIAGNOSIS — Q828 Other specified congenital malformations of skin: Secondary | ICD-10-CM

## 2024-02-17 NOTE — Patient Instructions (Addendum)
 Once areas healed, recommend using Curad Mediplast pads. Cut to fit wart or callus. Cover with Elastoplast waterproof tape or any waterproof band-aid. Change every 3 to 4 days, or sooner if necessary.  Treatment may require several months of regular use before results are seen.   Cryotherapy Aftercare  Wash gently with soap and water everyday.   Apply Vaseline and Band-Aid daily until healed.   Due to recent changes in healthcare laws, you may see results of your pathology and/or laboratory studies on MyChart before the doctors have had a chance to review them. We understand that in some cases there may be results that are confusing or concerning to you. Please understand that not all results are received at the same time and often the doctors may need to interpret multiple results in order to provide you with the best plan of care or course of treatment. Therefore, we ask that you please give us  2 business days to thoroughly review all your results before contacting the office for clarification. Should we see a critical lab result, you will be contacted sooner.   If You Need Anything After Your Visit  If you have any questions or concerns for your doctor, please call our main line at [phone] and press option 4 to reach your doctor's medical assistant. If no one answers, please leave a voicemail as directed and we will return your call as soon as possible. Messages left after 4 pm will be answered the following business day.   You may also send us  a message via MyChart. We typically respond to MyChart messages within 1-2 business days.  For prescription refills, please ask your pharmacy to contact our office. Our fax number is [phone].  If you have an urgent issue when the clinic is closed that cannot wait until the next business day, you can page your doctor at the number below.    Please note that while we do our best to be available for urgent issues outside of office hours, we are  not available 24/7.   If you have an urgent issue and are unable to reach us , you may choose to seek medical care at your doctor's office, retail clinic, urgent care center, or emergency room.  If you have a medical emergency, please immediately call 911 or go to the emergency department.  Pager Numbers  - Dr. Bary Likes: [phone]  - Dr. Annette Barters: [phone]  - Dr. Felipe Horton: [phone]   In the event of inclement weather, please call our main line at [phone] for an update on the status of any delays or closures.  Dermatology Medication Tips: Please keep the boxes that topical medications come in in order to help keep track of the instructions about where and how to use these. Pharmacies typically print the medication instructions only on the boxes and not directly on the medication tubes.   If your medication is too expensive, please contact our office at [phone] option 4 or send us  a message through MyChart.   We are unable to tell what your co-pay for medications will be in advance as this is different depending on your insurance coverage. However, we may be able to find a substitute medication at lower cost or fill out paperwork to get insurance to cover a needed medication.   If a prior authorization is required to get your medication covered by your insurance company, please allow us  1-2 business days to complete this process.  Drug prices often vary depending on where the prescription  is filled and some pharmacies may offer cheaper prices.  The website www.goodrx.com contains coupons for medications through different pharmacies. The prices here do not account for what the cost may be with help from insurance (it may be cheaper with your insurance), but the website can give you the price if you did not use any insurance.  - You can print the associated coupon and take it with your prescription to the pharmacy.  - You may also stop by our office during regular business  hours and pick up a GoodRx coupon card.  - If you need your prescription sent electronically to a different pharmacy, notify our office through [facility] or by phone at [phone] option 4.     Si Usted Necesita Algo Despus de Su Visita  Tambin puede enviarnos un mensaje a travs de Clinical cytogeneticist. Por lo general respondemos a los mensajes de MyChart en el transcurso de 1 a 2 das hbiles.  Para renovar recetas, por favor pida a su farmacia que se ponga en contacto con nuestra oficina. Franz Jacks de fax es [place] [phone].  Si tiene un asunto urgente cuando la clnica est cerrada y que no puede esperar hasta el siguiente da hbil, puede llamar/localizar a su doctor(a) al nmero que aparece a continuacin.   Por favor, tenga en cuenta que aunque hacemos todo lo posible para estar disponibles para asuntos urgentes fuera del horario de [place], no estamos disponibles las 24 horas del da, los 7 [address]  [address] de la [place].   Si tiene un problema urgente y no puede comunicarse con nosotros, puede optar por buscar atencin mdica  en el consultorio de su doctor(a), en una clnica privada, en un centro de atencin urgente o en una sala de emergencias.  Si tiene Engineer, drilling, por favor llame inmediatamente al 911 o vaya a la sala de emergencias.  Nmeros de bper  - Dr. Bary Likes: [phone]  - Dra. Annette Barters: [phone]  - Dr. Felipe Horton: [phone]   En caso de inclemencias del tiempo, por favor llame a Lajuan Pila principal al [phone] para una actualizacin sobre el [place] de cualquier retraso o cierre.  Consejos para la medicacin en dermatologa: Por favor, guarde las cajas en las que vienen los medicamentos de uso tpico para ayudarle a seguir las instrucciones sobre dnde y cmo usarlos. Las farmacias generalmente imprimen las instrucciones del medicamento slo en las cajas y no directamente en los tubos del [place].   Si su medicamento es muy caro, por favor,  pngase en contacto con Bettyjane Brunet llamando al [phone] y presione la opcin 4 o envenos un mensaje a travs de Clinical cytogeneticist.   No podemos decirle cul ser su copago por los medicamentos por adelantado ya que esto es diferente dependiendo de la cobertura de su seguro. Sin embargo, es posible que podamos encontrar un medicamento sustituto a Audiological scientist un formulario para que el seguro cubra el medicamento que se considera necesario.   Si se requiere una autorizacin previa para que su compaa de seguros [place] su medicamento, por favor permtanos de 1 a 2 das hbiles para completar este proceso.  Los precios de los medicamentos varan con frecuencia dependiendo del Environmental consultant de dnde se surte la receta y alguna farmacias pueden ofrecer precios ms baratos.  El sitio web www.goodrx.com tiene cupones para medicamentos de Health and safety inspector. Los precios aqu no tienen en cuenta lo que podra costar con la ayuda del seguro (puede ser ms barato con su seguro), pero el sitio web  puede darle el precio si no utiliz [employer].  - Puede imprimir el cupn correspondiente y llevarlo con su receta a la farmacia.  - Tambin puede pasar por nuestra oficina durante el horario de atencin regular y Education officer, museum una tarjeta de cupones de GoodRx.  - Si necesita que su receta se enve electrnicamente a una farmacia diferente, informe a nuestra oficina a travs de MyChart de [place] o por telfono llamando al [phone] y presione la opcin 4.

## 2024-02-17 NOTE — Progress Notes (Signed)
   Follow-Up Visit   Subjective  Sherry Oliver is a 63 y.o. 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 Reports today her feet are painful and she would like to have treated. Reports she has 3 spots at right foot and 2 spots at left foot   The patient has spots, moles and lesions to be evaluated, some may be new or changing and the patient may have concern these could be cancer.   The following portions of the chart were reviewed this encounter and updated as appropriate: medications, allergies, medical history  Review of Systems:  No other skin or systemic complaints except as noted in HPI or Assessment and Plan.  Objective  Well appearing patient in no apparent distress; mood and affect are within normal limits.   A focused examination was performed of the following areas: B/l feet   Relevant exam findings are noted in the Assessment and Plan.  Right plantar foot x 3, left plantar foot x 2 (5) Right plantar foot x 3, left plantar foot x 2  Assessment & Plan     POROKERATOSIS (5) Right plantar foot x 3, left plantar foot x 2 (5) Patient treated with Ln2 and Squaric 3 % acid applied to affected areas and covered with bandage.  Squaric Acid may be used as immunotherapy to treat warts and other skin conditions. Squaric Acid 3% applied today. Areas for treated should be left covered for 4-12 hours, then areas should be uncovered and washed off thoroughly with soap and water and rinsed. The skin where Squaric Acid was applied may get red and irritated. It may require several treatments to achieve desired results. Prior to application reviewed risk of inflammation and irritation.  Destruction of lesion - Right plantar foot x 3, left plantar foot x 2 (5) Complexity: simple   Destruction method: cryotherapy   Informed consent: discussed and consent  obtained   Timeout:  patient name, date of birth, surgical site, and procedure verified Lesion destroyed using liquid nitrogen: Yes   Region frozen until ice ball extended beyond lesion: Yes   Cryo cycles: 1 or 2. Outcome: patient tolerated procedure well with no complications   Post-procedure details: wound care instructions given    Return for keep follow up as scheduled .  I, Randee Busing, CMA, am acting as scribe for Harris Liming, MD.   Documentation: I have reviewed the above documentation for accuracy and completeness, and I agree with the above.  Harris Liming, MD

## 2024-02-22 DIAGNOSIS — G4733 Obstructive sleep apnea (adult) (pediatric): Secondary | ICD-10-CM | POA: Diagnosis not present

## 2024-02-22 DIAGNOSIS — J4540 Moderate persistent asthma, uncomplicated: Secondary | ICD-10-CM | POA: Diagnosis not present

## 2024-02-22 DIAGNOSIS — Z79899 Other long term (current) drug therapy: Secondary | ICD-10-CM | POA: Diagnosis not present

## 2024-02-22 DIAGNOSIS — R911 Solitary pulmonary nodule: Secondary | ICD-10-CM | POA: Diagnosis not present

## 2024-02-23 ENCOUNTER — Ambulatory Visit: Admitting: Dermatology

## 2024-02-23 DIAGNOSIS — L209 Atopic dermatitis, unspecified: Secondary | ICD-10-CM | POA: Diagnosis not present

## 2024-02-23 DIAGNOSIS — Z79899 Other long term (current) drug therapy: Secondary | ICD-10-CM

## 2024-02-23 DIAGNOSIS — L2089 Other atopic dermatitis: Secondary | ICD-10-CM

## 2024-02-23 MED ORDER — DUPIXENT 300 MG/2ML ~~LOC~~ SOAJ: SUBCUTANEOUS | 1 refills | Status: AC

## 2024-02-23 NOTE — Patient Instructions (Signed)

## 2024-02-23 NOTE — Progress Notes (Signed)
   Follow-Up Visit   Subjective  Sherry Oliver is a 63 y.o. female who presents for the following: Atopic Derm arms, hands, feet, 26m f/u, Dupixent  sq injections q 2 wks, Tacrolumus 0.1% oint prn, improved No side effects from Dupixent , except some transient pain with injection  The following portions of the chart were reviewed this encounter and updated as appropriate: medications, allergies, medical history  Review of Systems:  No other skin or systemic complaints except as noted in HPI or Assessment and Plan.  Objective  Well appearing patient in no apparent distress; mood and affect are within normal limits.   A focused examination was performed of the following areas: Hands, arms  Relevant exam findings are noted in the Assessment and Plan.    Assessment & Plan   ATOPIC DERMATITIS Started Dupixent  03/2020 Hands, feet, arms Exam: erythema of palms with hyperlinear skin markings, mild xerosis palms, pt states feet are clear. <1% BSA on Dupixent   Chronic condition with duration or expected duration over one year. Currently well-controlled.   Atopic dermatitis - Severe, on Dupixent  (biologic medication).  Atopic dermatitis (eczema) is a chronic, relapsing, pruritic condition that can significantly affect quality of life. It is often associated with allergic rhinitis and/or asthma and can require treatment with topical medications, phototherapy, or in severe cases biologic medications, which require long term medication management.    Treatment Plan: Cont Dupixent  300mg /30ml sq injections q 2 wks Cont Tacrolimus  0.1% oint prn flares Cont to avoid her documented chemical allergens   Dupilumab  (Dupixent ) is a treatment given by injection for adults and children with moderate-to-severe atopic dermatitis. Goal is control of skin condition, not cure. It is given as 2 injections at the first dose followed by 1 injection ever 2 weeks thereafter.  Young children are dosed  monthly.  Potential side effects include allergic reaction, herpes infections, injection site reactions and conjunctivitis (inflammation of the eyes).  The use of Dupixent  requires long term medication management, including periodic office visits.  Recommend gentle skin care.   Long term medication management.  Patient is using long term (months to years) prescription medication  to control their dermatologic condition.  These medications require periodic monitoring to evaluate for efficacy and side effects and may require periodic laboratory monitoring.  OTHER ATOPIC DERMATITIS   Related Medications tacrolimus  (PROTOPIC ) 0.1 % ointment Apply topically daily. Dupilumab  (DUPIXENT ) 300 MG/2ML SOAJ INJECT 300 MG UNDER THE SKIN EVERY 14 DAYS  Return in about 6 months (around 08/24/2024) for Atopic Derm.  I, Rollie Clipper, RMA, am acting as scribe for Artemio Larry, MD .   Documentation: I have reviewed the above documentation for accuracy and completeness, and I agree with the above.  Artemio Larry, MD

## 2024-03-13 DIAGNOSIS — G4733 Obstructive sleep apnea (adult) (pediatric): Secondary | ICD-10-CM | POA: Diagnosis not present

## 2024-03-28 ENCOUNTER — Ambulatory Visit: Admitting: Family Medicine

## 2024-03-29 ENCOUNTER — Ambulatory Visit

## 2024-03-29 VITALS — BP 132/78 | HR 120 | Ht 60.5 in | Wt 157.0 lb

## 2024-03-29 DIAGNOSIS — M25561 Pain in right knee: Secondary | ICD-10-CM | POA: Insufficient documentation
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
## 2024-03-30 DIAGNOSIS — M25561 Pain in right knee: Secondary | ICD-10-CM

## 2024-03-31 ENCOUNTER — Other Ambulatory Visit: Payer: Self-pay | Admitting: Medical Genetics

## 2024-04-05 ENCOUNTER — Encounter: Payer: Self-pay | Admitting: Family Medicine

## 2024-04-05 ENCOUNTER — Ambulatory Visit: Admitting: Family Medicine

## 2024-04-05 ENCOUNTER — Other Ambulatory Visit

## 2024-04-05 ENCOUNTER — Other Ambulatory Visit: Payer: Self-pay | Admitting: Family Medicine

## 2024-04-05 VITALS — BP 138/72 | HR 88 | Ht 60.5 in | Wt 159.5 lb

## 2024-04-05 DIAGNOSIS — E039 Hypothyroidism, unspecified: Secondary | ICD-10-CM

## 2024-04-05 DIAGNOSIS — I10 Essential (primary) hypertension: Secondary | ICD-10-CM

## 2024-04-05 DIAGNOSIS — R7309 Other abnormal glucose: Secondary | ICD-10-CM

## 2024-04-05 DIAGNOSIS — Z0000 Encounter for general adult medical examination without abnormal findings: Secondary | ICD-10-CM

## 2024-04-05 DIAGNOSIS — I6930 Unspecified sequelae of cerebral infarction: Secondary | ICD-10-CM

## 2024-04-05 DIAGNOSIS — E66811 Obesity, class 1: Secondary | ICD-10-CM

## 2024-04-05 DIAGNOSIS — M25561 Pain in right knee: Secondary | ICD-10-CM | POA: Diagnosis not present

## 2024-04-05 DIAGNOSIS — J432 Centrilobular emphysema: Secondary | ICD-10-CM

## 2024-04-05 DIAGNOSIS — E782 Mixed hyperlipidemia: Secondary | ICD-10-CM

## 2024-04-05 LAB — POCT GLYCOSYLATED HEMOGLOBIN (HGB A1C): Hemoglobin A1C: 6 % — AB (ref 4.0–5.6)

## 2024-04-05 NOTE — Patient Instructions (Addendum)
 Thank you for coming to the office today.  Keep using knee brace and upcoming MRI and Orthopedic.  Recommend scaling back on Tylenol  to 500mg  x 2 = 1000mg  3 times a day  Okay to take a few Advil AS NEEDED over next few weeks as needed.  START anti inflammatory topical - OTC Voltaren (generic Diclofenac) topical 2-4 times a day as needed for pain swelling of affected joint for 1-2 weeks or longer.   Please schedule a Follow-up Appointment to: Return in about 3 months (around 07/17/2024) for 3-4 months fasting lab then 1 week later Annual Physical.  If you have any other questions or concerns, please feel free to call the office or send a message through MyChart. You may also schedule an earlier appointment if necessary.  Additionally, you may be receiving a survey about your experience at our office within a few days to 1 week by e-mail or mail. We value your feedback.  Marsa Officer, DO [facility], [place]

## 2024-04-05 NOTE — Progress Notes (Signed)
 Subjective:    Patient ID: Sherry Oliver, female    DOB: 1961/05/05, 63 y.o.   MRN: [id number]  Sherry Oliver is a 63 y.o. female presenting on 04/05/2024 for Medical Management of Chronic Issues   HPI  Discussed the use of AI scribe software for clinical note transcription with the patient, who gave verbal consent to proceed.  History of Present Illness   Sherry Oliver is a 63 year old female who presents with knee pain and swelling.  Knee pain and swelling Recent twisting injury, saw Dr Everlene on 03/29/24 - Persistent knee pain and swelling since injury approximately one month ago - Difficulty bending the knee - Significant pain with ambulation, requiring use of a cane and occasionally a walker, especially after activities such as grocery shopping - Pain is more pronounced in the morning - Tylenol  500 mg, two to three tablets, five to six times daily for pain management - Mirapex  taken at night without relief of knee pain - Avoids Advil due to history of stroke - Orthopedic appointment and MRI scheduled for next week, delayed due to insurance approval  Superficial venous changes of the knee - New prominent 'spidery vein' across the top of the knee developed over the past week - Vein is not painful, but there is some localized soreness  Insomnia   CHRONIC HTN: Reports occasional home BP checks. Has intermittent elevation, admits stressors Current Meds - HCTZ 25mg  daily Never pursued Valsartan . - Off Amlodipine  due to swelling Lifestyle: - Diet: admits some sodium / caffeine in diet, not always hydrating - Exercise: not as active due to knee pain Denies CP, dyspnea, HA, edema, dizziness / lightheadedness   Pre-Diabetes A1c down to 6.0, prior 6.2 to 6.3 - Attributed improvement to lifestyle changes, including approximately ten-pound weight loss over the past three to four months    Former Smoker   OSA on CPAP Pulm [facility] Dr Parris, now on new  CPAP machine, upgraded from 2011 Improved on CPAP, using nightly, benefits from therapy      04/05/2024    9:21 AM 11/22/2023    1:43 PM 07/20/2023    1:29 PM  Depression screen PHQ 2/9  Decreased Interest 1 1 1   Down, Depressed, Hopeless 2 1 1   PHQ - 2 Score 3 2 2   Altered sleeping 0 1 2  Tired, decreased energy 2 2 2   Change in appetite 1 2 1   Feeling bad or failure about yourself  2 1 1   Trouble concentrating 2 2 2   Moving slowly or fidgety/restless 0 0 0  Suicidal thoughts 0 0 0  PHQ-9 Score 10 10 10   Difficult doing work/chores Somewhat difficult Somewhat difficult Somewhat difficult       04/05/2024    9:21 AM 11/22/2023    1:44 PM 07/20/2023    1:30 PM 01/11/2023    2:38 PM  GAD 7 : Generalized Anxiety Score  Nervous, Anxious, on Edge 3 1 2 1   Control/stop worrying 3 1 3 1   Worry too much - different things 3 1 1  0  Trouble relaxing 3 2 1 2   Restless 1 1 1  0  Easily annoyed or irritable 1 0 0 0  Afraid - awful might happen 2 0 0 0  Total GAD 7 Score 16 6 8 4   Anxiety Difficulty Somewhat difficult Somewhat difficult      Social History   Tobacco Use   Smoking status: Former    Current packs/day: 0.00  Average packs/day: 1 pack/day for 54.0 years (54.0 ttl pk-yrs)    Types: Cigarettes    Quit date: 03/14/2018    Years since quitting: 6.0   Smokeless tobacco: Former  Building services engineer status: Never Used  Substance Use Topics   Alcohol use: Yes    Comment: rare   Drug use: Not Currently    Review of Systems Per HPI unless specifically indicated above     Objective:    BP 138/72 (BP Location: Left Arm, Cuff Size: Normal)   Pulse 88   Ht 5' 0.5 (1.537 m)   Wt 159 lb 8 oz (72.3 kg)   SpO2 97%   BMI 30.64 kg/m   Wt Readings from Last 3 Encounters:  04/05/24 159 lb 8 oz (72.3 kg)  03/29/24 157 lb (71.2 kg)  11/22/23 168 lb (76.2 kg)    Physical Exam Vitals and nursing note reviewed.  Constitutional:      General: She is not in acute  distress.    Appearance: Normal appearance. She is well-developed. She is not diaphoretic.     Comments: Well-appearing, comfortable, cooperative  HENT:     Head: Normocephalic and atraumatic.  Eyes:     General:        Right eye: No discharge.        Left eye: No discharge.     Conjunctiva/sclera: Conjunctivae normal.  Cardiovascular:     Rate and Rhythm: Normal rate.  Pulmonary:     Effort: Pulmonary effort is normal.  Skin:    General: Skin is warm and dry.     Findings: No erythema or rash.  Neurological:     Mental Status: She is alert and oriented to person, place, and time.  Psychiatric:        Mood and Affect: Mood normal.        Behavior: Behavior normal.        Thought Content: Thought content normal.     Comments: Well groomed, good eye contact, normal speech and thoughts     Results for orders placed or performed in visit on 04/05/24  POCT HgB A1C   Collection Time: 04/05/24  9:22 AM  Result Value Ref Range   Hemoglobin A1C 6.0 (A) 4.0 - 5.6 %   HbA1c POC (<> result, manual entry)     HbA1c, POC (prediabetic range)     HbA1c, POC (controlled diabetic range)        Assessment & Plan:   Problem List Items Addressed This Visit     Elevated hemoglobin A1c - Primary   Relevant Orders   POCT HgB A1C (Completed)   Essential hypertension   Knee pain, right   Obesity (BMI 30.0-34.9)      Knee Pain with Superficial Vein Prominence New superficial vein prominence and soreness likely due to superficial bruising or spider veins, possibly related to previous knee trauma and ongoing swelling.  Already has MRI scheduled to assess for structural damage. Advised acetaminophen  limit to 3000 mg/day. Short-term ibuprofen use considered reasonable despite previous cerebrovascular accident. Suggested topical diclofenac as stronger alternative to aspirin . - Continue using cane and walker as needed. - Undergo MRI to assess for structural damage. - Attend orthopedic  appointment next week. - Consider short-term use of ibuprofen for pain management. - Consider using topical diclofenac for pain management. - Limit acetaminophen  intake to 3000 mg per day.  Hypertension Continue blood pressure management as previously discussed. - Continue hydrochlorothiazide  for blood pressure management.  Prediabetes Recent HbA1c improved to 6.0%. Lost 10 pounds through lifestyle changes. Emphasized ongoing lifestyle modifications to avoid diabetes medications. - Continue lifestyle modifications for weight loss and blood sugar control. - Monitor blood sugar levels regularly.  Follow-up Insurance change on August 1st, job loss next week. Follow-up planned for early November, prefers afternoon appointments. Insurance changes do not require new prescriptions, but pharmacy needs updated insurance information. - Schedule follow-up appointment for early November. - Coordinate with pharmacy for insurance updates. - Schedule blood work prior to the November appointment.       Orders Placed This Encounter  Procedures   POCT HgB A1C    No orders of the defined types were placed in this encounter.   Follow up plan: Return in about 3 months (around 07/17/2024) for 3-4 months fasting lab then 1 week later Annual Physical.  Future labs ordered for 07/19/24   Marsa Officer, DO [facility] 04/05/2024, 9:26 AM

## 2024-04-06 ENCOUNTER — Ambulatory Visit

## 2024-04-07 ENCOUNTER — Other Ambulatory Visit
Admission: RE | Admit: 2024-04-07 | Discharge: 2024-04-07 | Disposition: A | Discharge status: Home / Self Care | Payer: Self-pay | Source: Ambulatory Visit | Attending: Medical Genetics | Admitting: Medical Genetics

## 2024-04-07 ENCOUNTER — Telehealth: Payer: Self-pay

## 2024-04-07 NOTE — Telephone Encounter (Signed)
 Copied from CRM #[id number]. Topic: Clinical - Medication Prior Auth >> Apr 07, 2024  9:38 AM Delon DASEN wrote: Reason for CRM: PA for MRI was denied through [employer]- [facility] [phone]- do you want to cancel or do peer to peer

## 2024-04-11 ENCOUNTER — Ambulatory Visit: Payer: BC Managed Care – PPO | Admitting: Dermatology

## 2024-04-12 ENCOUNTER — Ambulatory Visit

## 2024-04-12 ENCOUNTER — Ambulatory Visit: Admitting: Orthopedic Surgery

## 2024-04-12 DIAGNOSIS — G4733 Obstructive sleep apnea (adult) (pediatric): Secondary | ICD-10-CM | POA: Diagnosis not present

## 2024-04-18 ENCOUNTER — Telehealth: Payer: Self-pay

## 2024-04-18 DIAGNOSIS — G8929 Other chronic pain: Secondary | ICD-10-CM

## 2024-04-18 NOTE — Telephone Encounter (Signed)
 Called patient and provided the address to have xrays before appointment  orders are in

## 2024-04-18 NOTE — Telephone Encounter (Signed)
 Called patient and provided the address to have xrays before appointment  ordered are in

## 2024-04-19 ENCOUNTER — Ambulatory Visit
Admission: RE | Admit: 2024-04-19 | Discharge: 2024-04-19 | Disposition: A | Discharge status: Home / Self Care | Source: Ambulatory Visit | Attending: Orthopedic Surgery | Admitting: Orthopedic Surgery

## 2024-04-19 ENCOUNTER — Ambulatory Visit
Admission: RE | Admit: 2024-04-19 | Discharge: 2024-04-19 | Disposition: A | Discharge status: Home / Self Care | Attending: Orthopedic Surgery | Admitting: Orthopedic Surgery

## 2024-04-19 ENCOUNTER — Ambulatory Visit: Admitting: Orthopedic Surgery

## 2024-04-19 ENCOUNTER — Encounter: Payer: Self-pay | Admitting: Orthopedic Surgery

## 2024-04-19 VITALS — BP 166/89 | Ht 60.0 in | Wt 162.0 lb

## 2024-04-19 DIAGNOSIS — M1711 Unilateral primary osteoarthritis, right knee: Secondary | ICD-10-CM | POA: Diagnosis not present

## 2024-04-19 DIAGNOSIS — M25561 Pain in right knee: Secondary | ICD-10-CM | POA: Diagnosis not present

## 2024-04-19 DIAGNOSIS — G8929 Other chronic pain: Secondary | ICD-10-CM | POA: Insufficient documentation

## 2024-04-19 DIAGNOSIS — M25461 Effusion, right knee: Secondary | ICD-10-CM | POA: Diagnosis not present

## 2024-04-19 MED ORDER — METHYLPREDNISOLONE ACETATE 40 MG/ML IJ SUSP
40.0000 mg | Freq: Once | INTRAMUSCULAR | Status: AC
  Administered 2024-04-19: 40 mg via INTRAMUSCULAR

## 2024-04-19 NOTE — Addendum Note (Signed)
 Addended by: RODGERS LACY on: 04/19/2024 02:53 PM   Modules accepted: Orders

## 2024-04-19 NOTE — Progress Notes (Signed)
 New Patient Visit  Assessment: Sherry Oliver is a 63 y.o. female with the following: 1. Acute pain of right knee  Plan: FIDENCIA MCCLOUD has pain in the right knee.  She twisted her knee, felt a pop, primarily in the posterior aspect of the knee.  This happened about 6 weeks ago.  Radiographs demonstrate some degenerative changes.  There is loss of joint space within the patellofemoral joint, as well as within the medial compartment.  She notes some improvement in her symptoms overall.  She does not have a large effusion in clinic today.  I have recommended a steroid injection.  In addition, I have provided her with home exercises.  I would like see her back in 6 weeks.  If she continues to have issues at that time, would recommend an MRI.   Procedure note injection Right knee joint   Verbal consent was obtained to inject the right knee joint  Timeout was completed to confirm the site of injection.  The skin was prepped with alcohol and ethyl chloride was sprayed at the injection site.  A 21-gauge needle was used to inject 40 mg of Depo-Medrol  and 1% lidocaine  (4 cc) into the right knee using an anterolateral approach.  There were no complications. A sterile bandage was applied.   Follow-up: Return in about 6 weeks (around 05/31/2024).  Subjective:  Chief Complaint  Patient presents with   Knee Pain    Strained the right knee from moving boxes in the basement. Pain in the inside of the knee and the back of the knee  she made a step and heard a pop behind the knee     History of Present Illness: Sherry Oliver is a 63 y.o. female who has been referred by Parris Juneau, MD for evaluation of right knee pain.  She was moving some boxes in her basement, and strained her knee.  She then twisted her knee, felt a pop in the posterior aspect of the knee.  At that point, she was unable to bear weight.  She did note some swelling.  Since then, she has been using a walker, and it is  now using a cane.  She notes some improvement in her symptoms.  She was evaluated by her PCP, and an MRI was ordered.  However, this has not been authorized.  She has been taking Tylenol  and ibuprofen.  She has been trying to work on some exercises.  Her motion is improving.  She is currently complaining of pain in the medial and posterior knee.   Review of Systems: No fevers or chills No numbness or tingling No chest pain No shortness of breath No bowel or bladder dysfunction No GI distress No headaches   Medical History:  Past Medical History:  Diagnosis Date   Allergy     Gastropathy    GERD (gastroesophageal reflux disease)    Headache    Hyperlipidemia    Hypertension    Hypothyroid    Lumbar radiculopathy    Lung nodule    Sleep apnea     Past Surgical History:  Procedure Laterality Date   APPENDECTOMY  2008   BLADDER SURGERY  1965   bladder stem   BREAST BIOPSY Left 06/06/2019   Lymph node US  Bx, hydromarker. REACTIVE LYMPH NODE, FAVOR BENIGN   COLONOSCOPY WITH PROPOFOL  N/A 02/19/2015   Procedure: COLONOSCOPY WITH PROPOFOL ;  Surgeon: Gladis RAYMOND Mariner, MD;  Location: [facility] ENDOSCOPY;  Service: Endoscopy;  Laterality: N/A;   COLONOSCOPY  WITH PROPOFOL  N/A 03/24/2018   Procedure: COLONOSCOPY WITH PROPOFOL ;  Surgeon: Gaylyn Gladis PENNER, MD;  Location[facility] ENDOSCOPY;  Service: Endoscopy;  Laterality: N/A;   ELBOW SURGERY Right 1982   ESOPHAGOGASTRODUODENOSCOPY     FOOT SURGERY Left 2007   neuroma removal, repeat 2008   OPEN REDUCTION INTERNAL FIXATION (ORIF) DISTAL RADIAL FRACTURE Right 03/24/2022   Procedure: ORIF right distal radius fracture;  Surgeon: Kathlynn Sharper, MD;  Location: [facility] ORS;  Service: Orthopedics;  Laterality: Right;    Family History  Problem Relation Age of Onset   Breast cancer Mother 60   BRCA 1/2 Mother        positive for Brca 2   Cancer Mother        bladder   Breast cancer Maternal Aunt 26   Breast cancer Maternal Grandfather         70's   Alcohol abuse Father    Lung cancer Father 60       mets to brain   Heart disease Maternal Grandmother    Stroke Paternal Grandmother    Social History   Tobacco Use   Smoking status: Former    Current packs/day: 0.00    Average packs/day: 1 pack/day for 54.0 years (54.0 ttl pk-yrs)    Types: Cigarettes    Quit date: 03/14/2018    Years since quitting: 6.1   Smokeless tobacco: Former  Building services engineer status: Never Used  Substance Use Topics   Alcohol use: Yes    Comment: rare   Drug use: Not Currently    Allergies  Allergen Reactions   Balsam Other (See Comments)    Positive patch test   Benzalkonium Chloride Other (See Comments)    Positive patch test   Bronopol Other (See Comments)    2-Bromo-2-Nitropropane-1,3-Diol (Bronopol)-Positive patch test   Chlorzoxazone Nausea And Vomiting   Chromium Other (See Comments)    Potassium dichromate-Positive patch test   Cocamidopropyl Betaine Other (See Comments)    Positive patch test   Crestor [Rosuvastatin]     Tired and depressed   Elemental Sulfur Other (See Comments)    Hyperactive    Glutaral Other (See Comments)    Positive patch test   Latex Swelling   Methyldibromoglutaronitrile Other (See Comments)    Positive patch test   Propylene Glycol Other (See Comments)    Positive patch test   Sulfa Antibiotics Other (See Comments)    Hyperactivity   Tomato    Benzocaine Rash   Cobalt Rash   Dibucaine Rash   Formaldehyde Rash   Nickel Sulfate [Nickel] Rash   Other Other (See Comments) and Rash    Uncoded Allergy . Allergen: fragrance mix Uncoded Allergy . Allergen: ethylenediamine dihydrochloride  Paraben mix-Positive patch test   Penicillins Rash   Potassium Dichromate Rash   Tetracaine Rash    Current Meds  Medication Sig   Ascorbic Acid (VITAMIN C) 1000 MG tablet Take 1,000 mg by mouth daily.   aspirin  81 MG chewable tablet Chew 1 tablet (81 mg total) by mouth daily.   b complex vitamins tablet  Take 1 tablet by mouth daily.   Calcium Carbonate-Vitamin D 600-400 MG-UNIT per tablet Take 1 tablet by mouth daily.   clotrimazole-betamethasone (LOTRISONE) cream Apply topically 2 (two) times daily.   desoximetasone  (TOPICORT ) 0.25 % cream Apply to affected areas one to two times daily as needed until rash improved. Avoid applying to face, groin, and axilla.   Dupilumab  (DUPIXENT ) 300 MG/2ML SOAJ INJECT  300 MG UNDER THE SKIN EVERY 14 DAYS   esomeprazole (NEXIUM) 20 MG capsule Take 20 mg by mouth in the morning and at bedtime.   hydrochlorothiazide  (HYDRODIURIL ) 25 MG tablet TAKE 1 TABLET DAILY (PLEASE MAKE APPOINTMENT FOR FOLLOW UP APPOINTMENT)   ibuprofen (ADVIL,MOTRIN) 200 MG tablet Take 400 mg by mouth 2 (two) times daily.   levothyroxine  (SYNTHROID ) 88 MCG tablet Take 1 tablet (88 mcg total) by mouth daily before breakfast.   liothyronine  (CYTOMEL ) 5 MCG tablet Take 1 tablet (5 mcg total) by mouth daily.   Multiple Vitamin (MULTIVITAMIN WITH MINERALS) TABS tablet Take 1 tablet by mouth daily.   pramipexole  (MIRAPEX ) 0.75 MG tablet Take 1 tablet (0.75 mg total) by mouth at bedtime.   pravastatin  (PRAVACHOL ) 20 MG tablet Take 1 tablet (20 mg total) by mouth daily.   tacrolimus  (PROTOPIC ) 0.1 % ointment Apply topically daily.   zinc sulfate 220 (50 Zn) MG capsule Take 220 mg by mouth daily.    Objective: BP (!) 166/89   Ht 5' (1.524 m)   Wt 162 lb (73.5 kg)   BMI 31.64 kg/m   Physical Exam:  General: Alert and oriented. and No acute distress. Gait: Right sided antalgic gait. and Ambulates with the assistance of a cane  Evaluation of the right knee demonstrates no obvious deformity.  Mild effusion.  Tenderness in the posterior medial aspect of the knee.  She also is tenderness in the medial aspect of the knee.  Tenderness to palpation along the medial joint line.  No increased laxity to varus or valgus stress.  Negative Lachman.  She has good range of motion.  Pain in the posterior  knee is worse with extension.  IMAGING: I personally ordered and reviewed the following images  X-rays of the right knee were obtained and available in clinic today.  Mild varus alignment.  Mild to moderate loss of joint space within the medial compartment.  There is loss of joint space within the patellofemoral compartment.  Small osteophytes.   New Medications:  No orders of the defined types were placed in this encounter.     Oneil DELENA Horde, MD  04/19/2024 2:13 PM

## 2024-04-19 NOTE — Patient Instructions (Addendum)
 Knee Exercises  Ask your health care provider which exercises are safe for you. Do exercises exactly as told by your health care provider and adjust them as directed. It is normal to feel mild stretching, pulling, tightness, or discomfort as you do these exercises. Stop right away if you feel sudden pain or your pain gets worse. Do not begin these exercises until told by your health care provider.  Stretching and range-of-motion exercises These exercises warm up your muscles and joints and improve the movement and flexibility of your knee. These exercises also help to relieve pain and swelling.  Knee extension, prone Lie on your abdomen (prone position) on a bed. Place your left / right knee just beyond the edge of the surface so your knee is not on the bed. You can put a towel under your left / right thigh just above your kneecap for comfort. Relax your leg muscles and allow gravity to straighten your knee (extension). You should feel a stretch behind your left / right knee. Hold this position for 10 seconds. Scoot up so your knee is supported between repetitions. Repeat 10 times. Complete this exercise 3-4 times per week.     Knee flexion, active Lie on your back with both legs straight. If this causes back discomfort, bend your left / right knee so your foot is flat on the floor. Slowly slide your left / right heel back toward your buttocks. Stop when you feel a gentle stretch in the front of your knee or thigh (flexion). Hold this position for 10 seconds. Slowly slide your left / right heel back to the starting position. Repeat 10 times. Complete this exercise 3-4 times per week.      Quadriceps stretch, prone Lie on your abdomen on a firm surface, such as a bed or padded floor. Bend your left / right knee and hold your ankle. If you cannot reach your ankle or pant leg, loop a belt around your foot and grab the belt instead. Gently pull your heel toward your buttocks. Your knee should  not slide out to the side. You should feel a stretch in the front of your thigh and knee (quadriceps). Hold this position for 10 seconds. Repeat 10 times. Complete this exercise 3-4 times per week.      Hamstring, supine Lie on your back (supine position). Loop a belt or towel over the ball of your left / right foot. The ball of your foot is on the walking surface, right under your toes. Straighten your left / right knee and slowly pull on the belt to raise your leg until you feel a gentle stretch behind your knee (hamstring). Do not let your knee bend while you do this. Keep your other leg flat on the floor. Hold this position for 10 seconds. Repeat 10 times. Complete this exercise 3-4 times per week.   Strengthening exercises These exercises build strength and endurance in your knee. Endurance is the ability to use your muscles for a long time, even after they get tired.  Quadriceps, isometric This exercise stretches the muscles in front of your thigh (quadriceps) without moving your knee joint (isometric). Lie on your back with your left / right leg extended and your other knee bent. Put a rolled towel or small pillow under your knee if told by your health care provider. Slowly tense the muscles in the front of your left / right thigh. You should see your kneecap slide up toward your hip or see increased dimpling  just above the knee. This motion will push the back of the knee toward the floor. For 10 seconds, hold the muscle as tight as you can without increasing your pain. Relax the muscles slowly and completely. Repeat 10 times. Complete this exercise 3-4 times per week. .     Straight leg raises This exercise stretches the muscles in front of your thigh (quadriceps) and the muscles that move your hips (hip flexors). Lie on your back with your left / right leg extended and your other knee bent. Tense the muscles in the front of your left / right thigh. You should see your kneecap  slide up or see increased dimpling just above the knee. Your thigh may even shake a bit. Keep these muscles tight as you raise your leg 4-6 inches (10-15 cm) off the floor. Do not let your knee bend. Hold this position for 10 seconds. Keep these muscles tense as you lower your leg. Relax your muscles slowly and completely after each repetition. Repeat 10 times. Complete this exercise 3-4 times per week.  Hamstring, isometric Lie on your back on a firm surface. Bend your left / right knee about 30 degrees. Dig your left / right heel into the surface as if you are trying to pull it toward your buttocks. Tighten the muscles in the back of your thighs (hamstring) to dig as hard as you can without increasing any pain. Hold this position for 10 seconds. Release the tension gradually and allow your muscles to relax completely for __________ seconds after each repetition. Repeat 10 times. Complete this exercise 3-4 times per week.  Hamstring curls If told by your health care provider, do this exercise while wearing ankle weights. Begin with 5 lb weights. Then increase the weight by 1 lb (0.5 kg) increments. You can also use an exercise band Lie on your abdomen with your legs straight. Bend your left / right knee as far as you can without feeling pain. Keep your hips flat against the floor. Hold this position for 10 seconds. Slowly lower your leg to the starting position. Repeat 10 times. Complete this exercise 3-4 times per week.      Squats This exercise strengthens the muscles in front of your thigh and knee (quadriceps). Stand in front of a table, with your feet and knees pointing straight ahead. You may rest your hands on the table for balance but not for support. Slowly bend your knees and lower your hips like you are going to sit in a chair. Keep your weight over your heels, not over your toes. Keep your lower legs upright so they are parallel with the table legs. Do not let your hips  go lower than your knees. Do not bend lower than told by your health care provider. If your knee pain increases, do not bend as low. Hold the squat position for 10 seconds. Slowly push with your legs to return to standing. Do not use your hands to pull yourself to standing. Repeat 10 times. Complete this exercise 3-4 times per week .     Wall slides This exercise strengthens the muscles in front of your thigh and knee (quadriceps). Lean your back against a smooth wall or door, and walk your feet out 18-24 inches (46-61 cm) from it. Place your feet hip-width apart. Slowly slide down the wall or door until your knees bend 90 degrees. Keep your knees over your heels, not over your toes. Keep your knees in line with your hips. Hold  this position for 10 seconds. Repeat 10 times. Complete this exercise 3-4 times per week.      Straight leg raises This exercise strengthens the muscles that rotate the leg at the hip and move it away from your body (hip abductors). Lie on your side with your left / right leg in the top position. Lie so your head, shoulder, knee, and hip line up. You may bend your bottom knee to help you keep your balance. Roll your hips slightly forward so your hips are stacked directly over each other and your left / right knee is facing forward. Leading with your heel, lift your top leg 4-6 inches (10-15 cm). You should feel the muscles in your outer hip lifting. Do not let your foot drift forward. Do not let your knee roll toward the ceiling. Hold this position for 10 seconds. Slowly return your leg to the starting position. Let your muscles relax completely after each repetition. Repeat 10 times. Complete this exercise 3-4 times per week.      Straight leg raises This exercise stretches the muscles that move your hips away from the front of the pelvis (hip extensors). Lie on your abdomen on a firm surface. You can put a pillow under your hips if that is more  comfortable. Tense the muscles in your buttocks and lift your left / right leg about 4-6 inches (10-15 cm). Keep your knee straight as you lift your leg. Hold this position for 10 seconds. Slowly lower your leg to the starting position. Let your leg relax completely after each repetition. Repeat 10 times. Complete this exercise 3-4 times per week.    Instructions Following Joint Injections  In clinic today, you received an injection in one of your joints (sometimes more than one).  Occasionally, you can have some pain at the injection site, this is normal.  You can place ice at the injection site, or take over-the-counter medications such as Tylenol  (acetaminophen ) or Advil (ibuprofen).  Please follow all directions listed on the bottle.  If your joint (knee or shoulder) becomes swollen, red or very painful, please contact the clinic for additional assistance.   Two medications were injected, including lidocaine  and a steroid (often referred to as cortisone).  Lidocaine  is effective almost immediately but wears off quickly.  However, the steroid can take a few days to improve your symptoms.  In some cases, it can make your pain worse for a couple of days.  Do not be concerned if this happens as it is common.  You can apply ice or take some over-the-counter medications as needed.   Injections in the same joint cannot be repeated for 3 months.  This helps to limit the risk of an infection in the joint.  If you were to develop an infection in your joint, the best treatment option would be surgery.

## 2024-04-26 LAB — GENECONNECT MOLECULAR SCREEN: Genetic Analysis Overall Interpretation: POSITIVE — AB

## 2024-04-27 ENCOUNTER — Telehealth: Payer: Self-pay | Admitting: Medical Genetics

## 2024-04-27 DIAGNOSIS — Z1501 Genetic susceptibility to malignant neoplasm of breast: Secondary | ICD-10-CM

## 2024-04-28 DIAGNOSIS — Z1501 Genetic susceptibility to malignant neoplasm of breast: Secondary | ICD-10-CM | POA: Insufficient documentation

## 2024-04-28 NOTE — Telephone Encounter (Signed)
 [place] [facility] Positive Result Note 04/28/2024 11:41 AM  THIRD ATTEMPT: Confirmed I was speaking with Sherry Oliver [phone] by using name and DOB. Informed participant the reason for this call is to provide results for the above study. Results revealed Hereditary Breast and Ovarian Syndrome. Genetic counseling was offered and participant requests a call back to schedule. All questions were answered, and participant was thanked for their time and support of the above study. Participant was encouraged to contact [facility] if they have any further questions or concerns.

## 2024-05-01 ENCOUNTER — Telehealth: Payer: Self-pay | Admitting: Medical Genetics

## 2024-05-03 NOTE — Telephone Encounter (Signed)
 [place] [place] [facility] 05/03/2024 10:44 AM  Confirmed I was speaking with Sherry Oliver [phone] by using name and DOB. Genetic counseling was offered and participant is scheduled. All questions were answered, and participant was thanked for their time and support of the above study. Participant was encouraged to contact [facility] if they have any further questions or concerns.    [name], BS [place]  [facility] Clinical Research Specialist II Direct Dial: [phone]  Fax: [phone]

## 2024-05-04 ENCOUNTER — Ambulatory Visit: Admitting: Genetic Counselor

## 2024-05-04 ENCOUNTER — Encounter: Payer: Self-pay | Admitting: Genetic Counselor

## 2024-05-04 DIAGNOSIS — Z8052 Family history of malignant neoplasm of bladder: Secondary | ICD-10-CM

## 2024-05-04 DIAGNOSIS — Z808 Family history of malignant neoplasm of other organs or systems: Secondary | ICD-10-CM

## 2024-05-04 DIAGNOSIS — Z803 Family history of malignant neoplasm of breast: Secondary | ICD-10-CM

## 2024-05-04 DIAGNOSIS — Z1501 Genetic susceptibility to malignant neoplasm of breast: Secondary | ICD-10-CM

## 2024-05-04 NOTE — Progress Notes (Addendum)
 PRIMARY PROVIDER:  Edman Marsa PARAS, DO  PRIMARY REASON FOR VISIT:  1. BRCA2 gene mutation positive in female   2. Family history of malignant neoplasm of breast   3. Family history of melanoma   4. Family history of bladder cancer     Ms. Blow, a 63 y.o. female, was seen for a [place] genetic counseling appointment as follow up to her participation in the [place] Study, part of the [employer] Research Program. Ms. Donahoo presents to clinic today to discuss the results of the genetic testing provided by the Gene[employer] study, which did identify a pathogenic variant in the BRCA2 gene.   RELEVANT MEDICAL HISTORY:  Ms. Devin is a 63 y.o. female with no personal history of cancer.    Last mammogram completed 01/2024, density C. Colonoscopy in 2016 detected one polyp, colonoscopy in 2019 was normal.   Past Medical History:  Diagnosis Date   Allergy     Gastropathy    GERD (gastroesophageal reflux disease)    Headache    Hyperlipidemia    Hypertension    Hypothyroid    Lumbar radiculopathy    Lung nodule    Sleep apnea     Past Surgical History:  Procedure Laterality Date   APPENDECTOMY  2008   BLADDER SURGERY  1965   bladder stem   BREAST BIOPSY Left 06/06/2019   Lymph node US  Bx, hydromarker. REACTIVE LYMPH NODE, FAVOR BENIGN   COLONOSCOPY WITH PROPOFOL  N/A 02/19/2015   Procedure: COLONOSCOPY WITH PROPOFOL ;  Surgeon: Gladis RAYMOND Mariner, MD;  Location: [facility] ENDOSCOPY;  Service: Endoscopy;  Laterality: N/A;   COLONOSCOPY WITH PROPOFOL  N/A 03/24/2018   Procedure: COLONOSCOPY WITH PROPOFOL ;  Surgeon: Mariner Gladis RAYMOND, MD;  Location: [facility] ENDOSCOPY;  Service: Endoscopy;  Laterality: N/A;   ELBOW SURGERY Right 1982   ESOPHAGOGASTRODUODENOSCOPY     FOOT SURGERY Left 2007   neuroma removal, repeat 2008   OPEN REDUCTION INTERNAL FIXATION (ORIF) DISTAL RADIAL FRACTURE Right 03/24/2022   Procedure: ORIF right distal radius fracture;  Surgeon: Kathlynn Sharper, MD;   Location: [facility] ORS;  Service: Orthopedics;  Laterality: Right;    Social History   Socioeconomic History   Marital status: Single    Spouse name: Not on file   Number of children: Not on file   Years of education: Not on file   Highest education level: Not on file  Occupational History   Not on file  Tobacco Use   Smoking status: Former    Current packs/day: 0.00    Average packs/day: 1 pack/day for 54.0 years (54.0 ttl pk-yrs)    Types: Cigarettes    Quit date: 03/14/2018    Years since quitting: 6.1   Smokeless tobacco: Former  Building services engineer status: Never Used  Substance and Sexual Activity   Alcohol use: Yes    Comment: rare   Drug use: Not Currently   Sexual activity: Not on file  Other Topics Concern   Not on file  Social History Narrative   Not on file   Social Drivers of Health   Financial Resource Strain: Not on file  Food Insecurity: No Food Insecurity (05/25/2022)   Hunger Vital Sign    Worried About Running Out of Food in the Last Year: Never true    Ran Out of Food in the Last Year: Never true  Transportation Needs: No Transportation Needs (05/25/2022)   PRAPARE - Transportation    Lack of Transportation (Medical): No    Lack  of Transportation (Non-Medical): No  Physical Activity: Not on file  Stress: Not on file  Social Connections: Moderately Isolated (05/25/2022)   Social Connection and Isolation Panel    Frequency of Communication with Friends and Family: Twice a week    Frequency of Social Gatherings with Friends and Family: Once a week    Attends Religious Services: Never    Database administrator or Organizations: Yes    Attends Engineer, structural: 1 to 4 times per year    Marital Status: Never married     FAMILY HISTORY:  We obtained a detailed, 3-generation family history.  Significant diagnoses are listed below: Family History  Problem Relation Age of Onset   Breast cancer Mother 80       bilateral   BRCA 1/2 Mother         positive for Brca 2   Cervical cancer Mother 23   Bladder Cancer Mother 76   Alcohol abuse Father    Lung cancer Father 60       mets to brain, smoking hx   Breast cancer Maternal Aunt 57 - 1   Cancer - Other Maternal Aunt        adrenal tumor   Heart disease Maternal Grandmother    Heart attack Maternal Grandmother 29   Breast cancer Maternal Grandfather 29 - 79   Stroke Paternal Grandmother    Heart attack Maternal Uncle    Heart disease Maternal Cousin    Heart disease Maternal Cousin    Melanoma Brother        arms   Birth defects Nephew    Heart attack Paternal Uncle    Stroke Paternal Aunt    Ms. Sukhu is aware of previous family history of genetic testing, reporting that her mother has a BRCA2 mutation. Patient's maternal ancestors are of European descent, and paternal ancestors are of European descent. There is no reported Ashkenazi Jewish ancestry.     GENETIC TEST RESULTS:  Ms. Schmeling participated in the [place] [facility] population genomic screening program. A single, heterozygous pathogenic variant was detected in the BRCA2 gene called [id number] p.[id number] . There were no pathogenic or likely pathogenic variants detected in other genes reported on in this study.   [employer] [facility] Population Screen is a screening test that analyzes 11 genes related to hereditary breast and ovarian cancer syndrome (HBOC), Lynch syndrome, and familial hypercholesterolemia. These include APOB, BRCA1, BRCA2, EPCAM, LDLR, LDLRAP1, PCSK9, PMS2 (excluding exons 11-15), MLH1, MSH2, MSH6. This test reports pathogenic and likely pathogenic variants, but does not report on variants of unknown significance. The absence of any additional pathogenic variants is reassuring, but does not rule out a hereditary condition. There are other variants and genes associated with heart disease, hereditary cancer and other inherited conditions that were not included in this test. Please see  full test report in labs, labeled GeneConnect Molecular Screen, dated 04/07/24. A section of the report is included below.     CLINICAL INFORMATION:  Pathogenic variants or mutations in BRCA2 are associated with an increased risk to develop breast cancer, ovarian cancer, prostate cancer, and pancreatic cancer, and melanoma skin cancer. People with a hereditary mutation in BRCA2 have a condition called Hereditary Breast and Ovarian Cancer (HBOC) Syndrome. The cancer risks and management recommendations associated with BRCA2 mutations are listed below:  Type of Cancer BRCA2 Mutation Lifetime Risk Average Lifetime Risk  Female breast 55-69% 12%  Contralateral, female breast  25% over 20 years  Less than 10% over 65 years  Female breast Up to 7.1% by age 80 0.1%  Ovarian 13-29% 1-2%  Prostate 19-61% 12%  Pancreatic 5-10% 1.6%  Melanoma Increased  1-2%   Limited data suggest there may be a slightly increased risk of serous uterine cancer in women with a BRCA mutation. Further research is needed to better understand this association.   Risk numbers are based on NCCN v3.2025. Risk estimates may evolve over time as new information is learned about BRCA2 mutations.    Management Recommendations: Per NCCN v1.2026, Genetic/Familial High Risk Assessment: Breast, Ovarian, Pancreatic, Prostate.   Breast Screening/Risk Reduction: For women (assigned female at birth):  Screening: Breast awareness starting at age 9 Clinical breast exam every 6-12 months starting at age 62, or sooner depending on family history  Annual breast MRI with contrast beginning at age 66-29 (or annual mammograms with consideration of tomosynthesis if MRI is unavailable), although the age to initiate screening may be individualized based on family history Annual mammogram beginning at age 23 until age 44 with consideration of tomosynthesis with continuation of annual breast MRI with contrast Management should be personalized to  people over the age of 65 years old  Surgery:  Prophylactic bilateral risk-reducing mastectomy (RRM), which is the removed of breast tissue before cancer develops, can reduce the risk of a breast cancer by 90% or more depending on the type of surgery. This option can be discussed with a surgeon and a plastic surgeon if breast reconstruction is completed as well.  For women with a BRCA2 pathogenic or likely pathogenic variant who are treated for breast cancer and have not had a bilateral mastectomy, screening with annual mammogram with consideration of tomosynthesis and breast MRI should continue as described above. Medication (chemoprevention): Consider breast cancer risk reducing medications   For men (assigned female at birth):  Breast self-exam training and education starting at age 15 years Annual clinical breast exam starting at age 67 years  Consider annual mammogram starting at age 86 or 10 years before the earliest known female breast cancer in the family (whichever comes first).   Gynecological Cancer Screening/Risk Reduction: Non-surgical risk reduction: Consultation with a gynecologic oncologist is recommended Consideration of certain types of birth control, thorough discussion of benefits and risks. Oral contraceptive use has been shown to reduce the risk of ovarian cancer by approximately 60% in BRCA2 mutation carriers if taken for at least 5 years. This risk reduction remains even after discontinuation of oral contraceptives.  Ovarian cancer screening is an option for women who chose not to have a RRSO or who, as of yet, have not completed their family. Current screening methods for ovarian cancer are neither sensitive nor specific, meaning that often early stage ovarian cancer cannot be diagnosed through this screening.  Screening can also be falsely positive with no cancer present. For this reason, RRSO is recommended over screening. If ovarian cancer screening is recommended by your  physician, it could include: CA-125 blood tests Transvaginal ultrasounds Clinical pelvic exams Surgical risk reduction:  Risk reducing salpingo-oophorectomy (RRSO, surgery to remove ovaries and fallopian tubes) is recommended beginning at age 24-45 or once childbearing is complete. Recommend that surgery is completed with a provider familiar in screening for cancers, such as a gynecologic oncologist. Removal of fallopian tubes with delayed removal of ovaries may be considered as well This surgery can reduce the risk of ovarian cancer by approximately 80% and can further reduce breast cancer risk in women who have not  yet gone through menopause. There is still a small risk of developing an ovarian-like cancer in the lining of the abdomen, called the peritoneum. Some studies have found a link between BRCA mutations and serous uterine cancer. We do not have enough information to make a recommendation about whether the uterus should be removed along with the ovaries and fallopian tubes. Women should talk about whether to remove the uterus (hysterectomy) with the doctor who will do the surgery. Hormone replacement therapy could be considered based on the physician's discretion. Consider referral to fertility specialist to discuss age related fertility considerations  Prostate Cancer Screening: Annual digital rectal exam (DRE) at age 28 Annual PSA blood test at age 61  Pancreatic Cancer Screening/Risk Reduction: Avoid smoking, heavy alcohol use, and obesity. Recommended for BRCA2 pathogenic and likely pathogenic variants. Begin at age 53 (or if there is a close family member with pancreatic cancer, 10 years before the age at which they were diagnosed) and repeat yearly.  Ideally, screening should be performed in experienced centers utilizing a multidisciplinary approach under research conditions. Recommended screening could include annual endoscopic ultrasound (preferred) and/or MRI of the  pancreas. CA19-9 testing in blood may be considered based on the physician's discretion.  Skin Cancer Screening and Risk Reduction: Regular skin self-examinations Individuals should notify their physicians of any changes to moles such as increasing in size, darkening in color, or other change in appearance. Annual skin examinations by a dermatologist  Follow sun-safety recommendations such as: Using UVA and UVB 30 SPF or higher sunscreen Avoiding sunburns Limiting sun exposure, especially during the hours of 11am-4pm  Wearing protective clothing and sunglasses Avoid using tanning beds  Additional Considerations: Recent studies have suggested PARP inhibitors may be a beneficial chemotherapeutic agent for a subset of patients with BRCA2-associated breast, ovarian, prostate, and pancreatic cancers. Clinical trials are currently in process to determine if and how these agents can be useful in the treatment of BRCA2 cancer patients.  This information is based on current understanding of the gene and may change in the future.    IMPLICATIONS FOR FAMILY MEMBERS: Individuals with BRCA2 are encouraged to share information about their genetic test results with family members. A family letter will be provided to help share this result with relatives. "Cascade screening" is a systematic process through which additional family members with a BRCA2 mutation are identified, starting with all first degree relatives over the age of 27 (parents, siblings, and children) of people who have a BRCA2 mutation.   Hereditary predisposition to cancer due to pathogenic variants in the BRCA2 gene has autosomal dominant inheritance. This means that an individual with a pathogenic variant has a 50% chance of passing the condition on to their children. Identification of a pathogenic variant allows for the recognition of at-risk relatives who can pursue testing for the familial variant.  Family members are encouraged to  consider genetic testing for this familial pathogenic variant. As there are generally no childhood cancer risks associated with pathogenic variants in the BRCA2 gene, individuals in the family are not typically recommended to have testing until they reach at least 65 years of age. They may contact our office at [phone] for more information or to schedule an appointment. Family members who live outside of the area are encouraged to find a genetic counselor in their area by visiting: [email or web address].  Additionally, for any family members who are of childbearing age, identification of a BRCA2 mutation may have reproductive implications. In rare cases a child may  inherit a BRCA2 mutation from each parent and have a condition called Fanconi Anemia (FA). FA is a childhood onset condition that can be associated with developmental differences, childhood cancer and bone marrow failure, among other health concerns. Therefore, if anyone of childbearing age is found to have the familial BRCA2 mutation, testing of their reproductive partner should be considered to clarify the chance to have a child with FA.    For individuals with an identified gene mutation, reproductive options are available including testing on-going pregnancies, testing children when they are of age, or IVF with the option to test embryos for mutations already found in the family. These options may be explored in greater detail with a reproductive or prenatal genetic counselor for more details.  RESOURCES: Facing Our Risk of Cancer Empowered (FORCE): www.facingourrisk.org   Bright Pink: www.brightpink.org Nothing Pink: www.nothingpink.org  [facility] Inherited Cancer Registry: https://inheritedcancer.net/  ADDITIONAL TESTING: Ms. Schlafer meets NCCN criteria for genetic testing, and would qualify for an expanded panel to evaluate other genes associated with hereditary breast cancer. She declined further testing at  this time.   PLAN:  Referral to the [facility] High Risk Breast Program is recommended to further discuss options for breast cancer screening and prevention. Ms. Supan declined a referral, plans to discuss with her PCP at annual visit next month.   Referral to [facility] Gynecologic Oncology is recommended to further discuss options for gynecologic cancer prevention. Ms. Darden declined a referral, plans to discuss with her PCP at annual visit next month.   Referral to [facility] is recommended to further discuss options for pancreatic cancer screening. Declined new referral, plans to discuss with current GI provider.   Results will be shared with Ms. Lacek's primary care provider, Edman Marsa PARAS, DO, for further management.   Genetic test results should be shared with relatives, who can consider undergoing genetic testing for the BRCA2 pathogenic variant.   Lastly, we encouraged Ms. Hammerstrom to remain in contact with [facility]'s genetics team so that we can continuously update the family history and inform her of any changes in our understanding of hereditary breast and ovarian cancer and any additional genetic testing that may be of benefit for this family.   Ms. Dant questions were answered to her satisfaction today. Our contact information was provided should additional questions or concerns arise. Thank you for the referral and allowing us  to share in the care of your patient.   Burnard Ogren, MS, [facility] Licensed, Retail [email or web address] phone: [phone]  65 minutes were spent on the date of the encounter in service to the patient including preparation, face-to-face consultation, documentation and care coordination. The patient was seen alone.    I connected with  Ms. Kataoka on 05/04/2024 at 2:08 pm EDT by telephone and verified that I am speaking with the correct person using  two identifiers.   Patient location: home Provider location: [facility]  __________________________________________________________________ For Office Staff:  Number of people involved in session: 1 Was an Intern/ student involved with case: yes, GC Intern Burnard Moose participated in this visit with patient permission

## 2024-05-13 DIAGNOSIS — G4733 Obstructive sleep apnea (adult) (pediatric): Secondary | ICD-10-CM | POA: Diagnosis not present

## 2024-05-17 ENCOUNTER — Other Ambulatory Visit: Payer: Self-pay | Admitting: Pulmonary Disease

## 2024-05-17 DIAGNOSIS — Z87891 Personal history of nicotine dependence: Secondary | ICD-10-CM

## 2024-05-23 ENCOUNTER — Encounter: Payer: Self-pay | Admitting: Orthopedic Surgery

## 2024-06-01 ENCOUNTER — Ambulatory Visit
Admission: RE | Admit: 2024-06-01 | Discharge: 2024-06-01 | Disposition: A | Discharge status: Home / Self Care | Source: Ambulatory Visit | Attending: Pulmonary Disease | Admitting: Pulmonary Disease

## 2024-06-01 DIAGNOSIS — Z87891 Personal history of nicotine dependence: Secondary | ICD-10-CM | POA: Diagnosis not present

## 2024-06-08 ENCOUNTER — Ambulatory Visit (INDEPENDENT_AMBULATORY_CARE_PROVIDER_SITE_OTHER): Admitting: Orthopedic Surgery

## 2024-06-08 DIAGNOSIS — M25561 Pain in right knee: Secondary | ICD-10-CM

## 2024-06-08 DIAGNOSIS — M25361 Other instability, right knee: Secondary | ICD-10-CM | POA: Diagnosis not present

## 2024-06-08 NOTE — Progress Notes (Unsigned)
 Return patient Visit  Assessment: Sherry Oliver is a 63 y.o. female with the following: 1. Acute pain of right knee  Plan: EURYDICE CALIXTO has pain in the right knee.  Her symptoms improved after the recent injection, however, she sustained a new injection after a fall in a twisting injury.  Since then, she has had a lot of pain.  She notes feelings of instability.  On exam she does have some swelling, as well as pain with hyperflexion.  Persistent pain and instability is concerning.  I would like to obtain an MRI.  She states understanding.  She will return to clinic once the results are available.     Follow-up: Return for After MRI.  Subjective:  Chief Complaint  Patient presents with   Right knee.    Injection 04/19/2024    History of Present Illness: Sherry Oliver is a 63 y.o. female who returns to clinic for repeat evaluation of right knee pain.  I saw her in clinic almost 2 months ago.  At that time her knee was injected.  She noted improvements.  However, she fell and twisted her right knee approximately 3 weeks ago.  Since then, the pain has worsened.  She feels instability in her knee.  It has not buckled.  She has not fallen.  Review of Systems: No fevers or chills No numbness or tingling No chest pain No shortness of breath No bowel or bladder dysfunction No GI distress No headaches     Objective: There were no vitals taken for this visit.  Physical Exam:  General: Alert and oriented. and No acute distress. Gait: Right sided antalgic gait. and Ambulates with the assistance of a cane  Right knee with mild swelling.  Tenderness to palpation along the medial joint line.  Pain with hyperflexion.  Positive McMurray's.  No increased laxity varus valgus stress.  Negative Lachman. IMAGING: I personally ordered and reviewed the following images  No new imaging obtained today     New Medications:  No orders of the defined types were placed in  this encounter.     Oneil DELENA Horde, MD  06/09/2024 11:19 PM

## 2024-06-09 ENCOUNTER — Encounter: Payer: Self-pay | Admitting: Orthopedic Surgery

## 2024-06-19 ENCOUNTER — Encounter: Payer: Self-pay | Admitting: Orthopedic Surgery

## 2024-06-20 ENCOUNTER — Telehealth: Payer: Self-pay | Admitting: Orthopedic Surgery

## 2024-06-20 NOTE — Telephone Encounter (Signed)
 Prior shara is in the referral communication and also in media.

## 2024-06-20 NOTE — Telephone Encounter (Signed)
 Dr. Onesimo pt  [facility] Faster w/[facility] [phone] lvm regarding this pt, stated that she needs a prior auth for MRI rt knee w/out contrast.  She didn't see one on file.

## 2024-06-20 NOTE — Telephone Encounter (Signed)
 Samule can you look into this for me?

## 2024-06-22 ENCOUNTER — Inpatient Hospital Stay: Admission: RE | Admit: 2024-06-22 | Discharge: 2024-06-22 | Attending: Orthopedic Surgery

## 2024-06-22 DIAGNOSIS — M25561 Pain in right knee: Secondary | ICD-10-CM

## 2024-06-27 ENCOUNTER — Encounter: Payer: Self-pay | Admitting: Orthopedic Surgery

## 2024-07-03 DIAGNOSIS — S83231A Complex tear of medial meniscus, current injury, right knee, initial encounter: Secondary | ICD-10-CM | POA: Diagnosis not present

## 2024-07-03 DIAGNOSIS — S82141A Displaced bicondylar fracture of right tibia, initial encounter for closed fracture: Secondary | ICD-10-CM | POA: Diagnosis not present

## 2024-07-06 ENCOUNTER — Ambulatory Visit: Admitting: Orthopedic Surgery

## 2024-07-17 ENCOUNTER — Encounter: Payer: Self-pay | Admitting: Radiology

## 2024-07-19 ENCOUNTER — Other Ambulatory Visit

## 2024-07-19 DIAGNOSIS — Z0000 Encounter for general adult medical examination without abnormal findings: Secondary | ICD-10-CM | POA: Diagnosis not present

## 2024-07-19 DIAGNOSIS — I10 Essential (primary) hypertension: Secondary | ICD-10-CM | POA: Diagnosis not present

## 2024-07-19 DIAGNOSIS — R7309 Other abnormal glucose: Secondary | ICD-10-CM

## 2024-07-19 DIAGNOSIS — E782 Mixed hyperlipidemia: Secondary | ICD-10-CM

## 2024-07-19 DIAGNOSIS — E039 Hypothyroidism, unspecified: Secondary | ICD-10-CM

## 2024-07-19 DIAGNOSIS — J432 Centrilobular emphysema: Secondary | ICD-10-CM

## 2024-07-19 DIAGNOSIS — I6930 Unspecified sequelae of cerebral infarction: Secondary | ICD-10-CM

## 2024-07-20 LAB — COMPREHENSIVE METABOLIC PANEL WITH GFR
AG Ratio: 1.4 (calc) (ref 1.0–2.5)
ALT: 17 U/L (ref 6–29)
AST: 17 U/L (ref 10–35)
Albumin: 4.2 g/dL (ref 3.6–5.1)
Alkaline phosphatase (APISO): 51 U/L (ref 37–153)
BUN: 24 mg/dL (ref 7–25)
CO2: 29 mmol/L (ref 20–32)
Calcium: 9.6 mg/dL (ref 8.6–10.4)
Chloride: 103 mmol/L (ref 98–110)
Creat: 0.97 mg/dL (ref 0.50–1.05)
Globulin: 3 g/dL (ref 1.9–3.7)
Glucose, Bld: 101 mg/dL — ABNORMAL HIGH (ref 65–99)
Potassium: 3.8 mmol/L (ref 3.5–5.3)
Sodium: 140 mmol/L (ref 135–146)
Total Bilirubin: 0.3 mg/dL (ref 0.2–1.2)
Total Protein: 7.2 g/dL (ref 6.1–8.1)
eGFR: 65 mL/min/1.73m2 (ref >=60)

## 2024-07-20 LAB — LIPID PANEL
Cholesterol: 186 mg/dL (ref <200)
HDL: 53 mg/dL (ref >=50)
LDL Cholesterol (Calc): 105 mg/dL — ABNORMAL HIGH
Non-HDL Cholesterol (Calc): 133 mg/dL — ABNORMAL HIGH (ref <130)
Total CHOL/HDL Ratio: 3.5 (calc) (ref <5.0)
Triglycerides: 161 mg/dL — ABNORMAL HIGH (ref <150)

## 2024-07-20 LAB — T4, FREE: Free T4: 1.1 ng/dL (ref 0.8–1.8)

## 2024-07-20 LAB — CBC WITH DIFFERENTIAL/PLATELET
Absolute Lymphocytes: 1333 {cells}/uL (ref 850–3900)
Absolute Monocytes: 905 {cells}/uL (ref 200–950)
Basophils Absolute: 68 {cells}/uL (ref 0–200)
Basophils Relative: 1.1 %
Eosinophils Absolute: 217 {cells}/uL (ref 15–500)
Eosinophils Relative: 3.5 %
HCT: 38 % (ref 35.0–45.0)
Hemoglobin: 12.2 g/dL (ref 11.7–15.5)
MCH: 29.5 pg (ref 27.0–33.0)
MCHC: 32.1 g/dL (ref 32.0–36.0)
MCV: 92 fL (ref 80.0–100.0)
MPV: 11.4 fL (ref 7.5–12.5)
Monocytes Relative: 14.6 %
Neutro Abs: 3677 {cells}/uL (ref 1500–7800)
Neutrophils Relative %: 59.3 %
Platelets: 372 Thousand/uL (ref 140–400)
RBC: 4.13 Million/uL (ref 3.80–5.10)
RDW: 13.1 % (ref 11.0–15.0)
Total Lymphocyte: 21.5 %
WBC: 6.2 Thousand/uL (ref 3.8–10.8)

## 2024-07-20 LAB — TSH: TSH: 1.3 m[IU]/L (ref 0.40–4.50)

## 2024-07-20 LAB — HEMOGLOBIN A1C
Hgb A1c MFr Bld: 6 % — ABNORMAL HIGH (ref <5.7)
Mean Plasma Glucose: 126 mg/dL
eAG (mmol/L): 7 mmol/L

## 2024-07-26 ENCOUNTER — Encounter: Payer: Self-pay | Admitting: Family Medicine

## 2024-07-26 ENCOUNTER — Other Ambulatory Visit: Payer: Self-pay | Admitting: Family Medicine

## 2024-07-26 ENCOUNTER — Ambulatory Visit: Admitting: Family Medicine

## 2024-07-26 VITALS — BP 122/80 | HR 96 | Ht 60.0 in | Wt 164.4 lb

## 2024-07-26 DIAGNOSIS — Z780 Asymptomatic menopausal state: Secondary | ICD-10-CM

## 2024-07-26 DIAGNOSIS — Z1501 Genetic susceptibility to malignant neoplasm of breast: Secondary | ICD-10-CM

## 2024-07-26 DIAGNOSIS — Z0000 Encounter for general adult medical examination without abnormal findings: Secondary | ICD-10-CM | POA: Diagnosis not present

## 2024-07-26 DIAGNOSIS — E782 Mixed hyperlipidemia: Secondary | ICD-10-CM

## 2024-07-26 DIAGNOSIS — Z23 Encounter for immunization: Secondary | ICD-10-CM

## 2024-07-26 DIAGNOSIS — R350 Frequency of micturition: Secondary | ICD-10-CM

## 2024-07-26 DIAGNOSIS — Z124 Encounter for screening for malignant neoplasm of cervix: Secondary | ICD-10-CM

## 2024-07-26 DIAGNOSIS — Z1509 Genetic susceptibility to other malignant neoplasm: Secondary | ICD-10-CM

## 2024-07-26 DIAGNOSIS — R3915 Urgency of urination: Secondary | ICD-10-CM

## 2024-07-26 DIAGNOSIS — N3281 Overactive bladder: Secondary | ICD-10-CM

## 2024-07-26 DIAGNOSIS — Z15068 Genetic susceptibility to other malignant neoplasm of digestive system: Secondary | ICD-10-CM

## 2024-07-26 DIAGNOSIS — I6930 Unspecified sequelae of cerebral infarction: Secondary | ICD-10-CM

## 2024-07-26 DIAGNOSIS — E039 Hypothyroidism, unspecified: Secondary | ICD-10-CM

## 2024-07-26 DIAGNOSIS — I10 Essential (primary) hypertension: Secondary | ICD-10-CM

## 2024-07-26 DIAGNOSIS — Z1502 Genetic susceptibility to malignant neoplasm of ovary: Secondary | ICD-10-CM

## 2024-07-26 DIAGNOSIS — G2581 Restless legs syndrome: Secondary | ICD-10-CM

## 2024-07-26 DIAGNOSIS — J0110 Acute frontal sinusitis, unspecified: Secondary | ICD-10-CM

## 2024-07-26 DIAGNOSIS — B379 Candidiasis, unspecified: Secondary | ICD-10-CM

## 2024-07-26 MED ORDER — AZITHROMYCIN 250 MG PO TABS: ORAL_TABLET | ORAL | 0 refills | Status: DC

## 2024-07-26 MED ORDER — FLUCONAZOLE 150 MG PO TABS: ORAL_TABLET | ORAL | 0 refills | Status: DC

## 2024-07-26 MED ORDER — PRAMIPEXOLE DIHYDROCHLORIDE 0.75 MG PO TABS: 0.7500 mg | ORAL_TABLET | Freq: Every day | ORAL | 3 refills | Status: AC

## 2024-07-26 MED ORDER — LEVOTHYROXINE SODIUM 88 MCG PO TABS
88.0000 ug | ORAL_TABLET | Freq: Every day | ORAL | 3 refills | Status: AC
Start: 2024-07-26 — End: ?

## 2024-07-26 MED ORDER — PRAVASTATIN SODIUM 20 MG PO TABS: 20.0000 mg | ORAL_TABLET | Freq: Every day | ORAL | 3 refills | Status: AC

## 2024-07-26 MED ORDER — HYDROCHLOROTHIAZIDE 25 MG PO TABS: 25.0000 mg | ORAL_TABLET | Freq: Every day | ORAL | 3 refills | Status: AC

## 2024-07-26 MED ORDER — LIOTHYRONINE SODIUM 5 MCG PO TABS: 5.0000 ug | ORAL_TABLET | Freq: Every day | ORAL | 3 refills | Status: AC

## 2024-07-26 NOTE — Patient Instructions (Addendum)
 Thank you for coming to the office today.  FLu Shot today  Prevnar-20 pneumonia vaccine today, good for 5-10 years  DEXA Scan (Bone mineral density) screening for osteoporosis  Call the Imaging Center below anytime to schedule your own appointment now that order has been placed.  [facility] [address] [place], [place] [address] Phone: [phone]  Refilled meds   Please schedule a Follow-up Appointment to: No follow-ups on file.  If you have any other questions or concerns, please feel free to call the office or send a message through MyChart. You may also schedule an earlier appointment if necessary.  Additionally, you may be receiving a survey about your experience at our office within a few days to 1 week by e-mail or mail. We value your feedback.  Marsa Officer, DO [facility], [place]

## 2024-07-26 NOTE — Progress Notes (Signed)
 Subjective:    Patient ID: Sherry Oliver, female    DOB: [birth year], 63 y.o.   MRN: [id number]  Sherry Oliver is a 63 y.o. female presenting on 07/26/2024 for Annual Exam   HPI  Discussed the use of AI scribe software for clinical note transcription with the patient, who gave verbal consent to proceed.  History of Present Illness   Sherry Oliver is a 63 year old female who presents for an annual physical exam.  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 - Family history of breast cancer in mother, aunt, and grandfather - No Pap smear since 2019 - Vulvovaginal itching and burning, suggestive of a yeast infection  Upper respiratory symptoms - Symptoms consistent with a sinus infection - Recent exposure to a family member ill for three weeks   Thyroid  disease - Currently taking levothyroxine  88 mcg for thyroid  management - Recent labs show normal TSH and T4 levels  Insomnia   Urinary Urgency / Pelvic Pressure Persistent problem >1 year + Reports recent issue with urinary frequency urgency Last urinalysis urine culture negative for infection No dysuria or blood or fever chills Admits frequency. She is on hydrochlorothiazide  has tried stopping this or adjusting meds without relief Not on medicine for bladder or has not seen Urologist  CHRONIC HTN: Reports occasional home BP checks. Has intermittent elevation, admits stressors Current Meds - HCTZ 25mg  daily Not on Valsartan  - Off Amlodipine  due to swelling Denies CP, dyspnea, HA, edema, dizziness /  lightheadedness  Pre-Diabetes A1c 6.0 stable in past 2 results, prior 6.2 to 6.3    OSA on CPAP Pulm [facility] Dr Parris, now on new CPAP machine, upgraded from 2011 Improved on CPAP, using nightly, benefits from therapy    HYPERLIPIDEMIA: Dramatic improved cholesterol but LDL still >70, at 105 Diet improved overall Continue Pravastatin  20mg  daily Previously by [facility] Dr Bosie and they have done cardiac testing including CT Cardiac score test, 59th percentile Failed Rosuvastatin (crestor), Pitavstatin, Atorvastatin (lipitor), Simvastatin (zocor)  Former Smoker      Additional update Mother has been diagnosed with bladder cancer again and she will start chemo again and Dorthea has had some increased stress with this. She is primary caregiver   Maternal Fam history heart disease      Health Maintenance: Flu Shot today   Colon CA Screening - Last Colonoscopy Dr Gaylyn 2019, no polyps. [facility].  Last Cologuard 07/30/23, negative, repeat 3 years, 2027   Last mammogram 01/18/24. BRCA 2 positive see discussion below on high risk screening  Past Surgical History:  Procedure Laterality Date   APPENDECTOMY  2008   BLADDER SURGERY  1965   bladder stem   BREAST BIOPSY Left 06/06/2019   Lymph node US  Bx, hydromarker. REACTIVE LYMPH NODE, FAVOR BENIGN   COLONOSCOPY WITH PROPOFOL  N/A 02/19/2015   Procedure: COLONOSCOPY WITH PROPOFOL ;  Surgeon: Gladis RAYMOND Gaylyn, MD;  Location[facility] ENDOSCOPY;  Service: Endoscopy;  Laterality: N/A;   COLONOSCOPY WITH PROPOFOL  N/A 03/24/2018   Procedure: COLONOSCOPY WITH PROPOFOL ;  Surgeon: Gaylyn Gladis RAYMOND, MD;  Location: [facility] ENDOSCOPY;  Service: Endoscopy;  Laterality: N/A;  ELBOW SURGERY Right 1982   ESOPHAGOGASTRODUODENOSCOPY     FOOT SURGERY Left 2007   neuroma removal, repeat 2008   OPEN REDUCTION INTERNAL FIXATION (ORIF) DISTAL RADIAL FRACTURE Right 03/24/2022   Procedure: ORIF right distal radius fracture;  Surgeon: Kathlynn Sharper, MD;   Location: [facility] ORS;  Service: Orthopedics;  Laterality: Right;        07/26/2024    1:48 PM 04/05/2024    9:21 AM 11/22/2023    1:43 PM  Depression screen PHQ 2/9  Decreased Interest 1 1 1   Down, Depressed, Hopeless 2 2 1   PHQ - 2 Score 3 3 2   Altered sleeping 1 0 1  Tired, decreased energy 1 2 2   Change in appetite 2 1 2   Feeling bad or failure about yourself  2 2 1   Trouble concentrating 2 2 2   Moving slowly or fidgety/restless 1 0 0  Suicidal thoughts 1 0 0  PHQ-9 Score 13 10  10    Difficult doing work/chores Somewhat difficult Somewhat difficult Somewhat difficult     Data saved with a previous flowsheet row definition       07/26/2024    1:48 PM 04/05/2024    9:21 AM 11/22/2023    1:44 PM 07/20/2023    1:30 PM  GAD 7 : Generalized Anxiety Score  Nervous, Anxious, on Edge 3 3 1 2   Control/stop worrying 3 3 1 3   Worry too much - different things 2 3 1 1   Trouble relaxing 2 3 2 1   Restless 2 1 1 1   Easily annoyed or irritable 2 1 0 0  Afraid - awful might happen 2 2 0 0  Total GAD 7 Score 16 16 6 8   Anxiety Difficulty Somewhat difficult Somewhat difficult Somewhat difficult      Past Medical History:  Diagnosis Date   Allergy     Gastropathy    GERD (gastroesophageal reflux disease)    Headache    Hyperlipidemia    Hypertension    Hypothyroid    Lumbar radiculopathy    Lung nodule    Sleep apnea    Past Surgical History:  Procedure Laterality Date   APPENDECTOMY  2008   BLADDER SURGERY  1965   bladder stem   BREAST BIOPSY Left 06/06/2019   Lymph node US  Bx, hydromarker. REACTIVE LYMPH NODE, FAVOR BENIGN   COLONOSCOPY WITH PROPOFOL  N/A 02/19/2015   Procedure: COLONOSCOPY WITH PROPOFOL ;  Surgeon: Gladis RAYMOND Mariner, MD;  Location: [facility] ENDOSCOPY;  Service: Endoscopy;  Laterality: N/A;   COLONOSCOPY WITH PROPOFOL  N/A 03/24/2018   Procedure: COLONOSCOPY WITH PROPOFOL ;  Surgeon: Mariner Gladis RAYMOND, MD;  Location: [facility] ENDOSCOPY;  Service: Endoscopy;  Laterality:  N/A;   ELBOW SURGERY Right 1982   ESOPHAGOGASTRODUODENOSCOPY     FOOT SURGERY Left 2007   neuroma removal, repeat 2008   OPEN REDUCTION INTERNAL FIXATION (ORIF) DISTAL RADIAL FRACTURE Right 03/24/2022   Procedure: ORIF right distal radius fracture;  Surgeon: Kathlynn Sharper, MD;  Location: [facility] ORS;  Service: Orthopedics;  Laterality: Right;   Social History   Socioeconomic History   Marital status: Single    Spouse name: Not on file   Number of children: Not on file   Years of education: Not on file   Highest education level: Not on file  Occupational History   Not on file  Tobacco Use   Smoking status: Former    Current packs/day: 0.00    Average packs/day: 1 pack/day for 54.0 years (54.0 ttl pk-yrs)  Types: Cigarettes    Quit date: 03/14/2018    Years since quitting: 6.3   Smokeless tobacco: Former  Building Services Engineer status: Never Used  Substance and Sexual Activity   Alcohol use: Yes    Comment: rare   Drug use: Not Currently   Sexual activity: Not on file  Other Topics Concern   Not on file  Social History Narrative   Not on file   Social Drivers of Health   Financial Resource Strain: Not on file  Food Insecurity: No Food Insecurity (05/25/2022)   Hunger Vital Sign    Worried About Running Out of Food in the Last Year: Never true    Ran Out of Food in the Last Year: Never true  Transportation Needs: No Transportation Needs (05/25/2022)   PRAPARE - Administrator, Civil Service (Medical): No    Lack of Transportation (Non-Medical): No  Physical Activity: Not on file  Stress: Not on file  Social Connections: Moderately Isolated (05/25/2022)   Social Connection and Isolation Panel    Frequency of Communication with Friends and Family: Twice a week    Frequency of Social Gatherings with Friends and Family: Once a week    Attends Religious Services: Never    Database Administrator or Organizations: Yes    Attends Engineer, Structural: 1 to 4  times per year    Marital Status: Never married  Catering Manager Violence: Not on file   Family History  Problem Relation Age of Onset   Breast cancer Mother 61       bilateral   BRCA 1/2 Mother        positive for Brca 2   Cervical cancer Mother 65   Bladder Cancer Mother 18   Alcohol abuse Father    Lung cancer Father 60       mets to brain, smoking hx   Breast cancer Maternal Aunt 41 - 52   Cancer - Other Maternal Aunt        adrenal tumor   Heart disease Maternal Grandmother    Heart attack Maternal Grandmother 68   Breast cancer Maternal Grandfather 79 - 79   Stroke Paternal Grandmother    Heart attack Maternal Uncle    Heart disease Maternal Cousin    Heart disease Maternal Cousin    Melanoma Brother        arms   Birth defects Nephew    Heart attack Paternal Uncle    Stroke Paternal Aunt    Current Outpatient Medications on File Prior to Visit  Medication Sig   Ascorbic Acid (VITAMIN C) 1000 MG tablet Take 1,000 mg by mouth daily.   aspirin  81 MG chewable tablet Chew 1 tablet (81 mg total) by mouth daily.   b complex vitamins tablet Take 1 tablet by mouth daily.   Calcium Carbonate-Vitamin D 600-400 MG-UNIT per tablet Take 1 tablet by mouth daily.   clotrimazole-betamethasone (LOTRISONE) cream Apply topically 2 (two) times daily.   desoximetasone  (TOPICORT ) 0.25 % cream Apply to affected areas one to two times daily as needed until rash improved. Avoid applying to face, groin, and axilla.   Dupilumab  (DUPIXENT ) 300 MG/2ML SOAJ INJECT 300 MG UNDER THE SKIN EVERY 14 DAYS   esomeprazole (NEXIUM) 20 MG capsule Take 20 mg by mouth in the morning and at bedtime.   ibuprofen (ADVIL,MOTRIN) 200 MG tablet Take 400 mg by mouth 2 (two) times daily.   Multiple Vitamin (MULTIVITAMIN WITH  MINERALS) TABS tablet Take 1 tablet by mouth daily.   tacrolimus  (PROTOPIC ) 0.1 % ointment Apply topically daily.   zinc sulfate 220 (50 Zn) MG capsule Take 220 mg by mouth daily.   No  current facility-administered medications on file prior to visit.    Review of Systems  Constitutional:  Negative for activity change, appetite change, chills, diaphoresis, fatigue and fever.  HENT:  Negative for congestion and hearing loss.   Eyes:  Negative for visual disturbance.  Respiratory:  Negative for cough, chest tightness, shortness of breath and wheezing.   Cardiovascular:  Negative for chest pain, palpitations and leg swelling.  Gastrointestinal:  Negative for abdominal pain, constipation, diarrhea, nausea and vomiting.  Genitourinary:  Negative for dysuria, frequency and hematuria.  Musculoskeletal:  Negative for arthralgias and neck pain.  Skin:  Negative for rash.  Neurological:  Negative for dizziness, weakness, light-headedness, numbness and headaches.  Hematological:  Negative for adenopathy.  Psychiatric/Behavioral:  Negative for behavioral problems, dysphoric mood and sleep disturbance.    Per HPI unless specifically indicated above     Objective:    BP 122/80 (BP Location: Right Arm, Patient Position: Sitting, Cuff Size: Normal)   Pulse 96   Ht 5' (1.524 m)   Wt 164 lb 6 oz (74.6 kg)   SpO2 96%   BMI 32.10 kg/m   Wt Readings from Last 3 Encounters:  07/26/24 164 lb 6 oz (74.6 kg)  04/19/24 162 lb (73.5 kg)  04/05/24 159 lb 8 oz (72.3 kg)    Physical Exam Vitals and nursing note reviewed.  Constitutional:      General: She is not in acute distress.    Appearance: She is well-developed. She is not diaphoretic.     Comments: Well-appearing, comfortable, cooperative  HENT:     Head: Normocephalic and atraumatic.  Eyes:     General:        Right eye: No discharge.        Left eye: No discharge.     Conjunctiva/sclera: Conjunctivae normal.     Pupils: Pupils are equal, round, and reactive to light.  Neck:     Thyroid : No thyromegaly.  Cardiovascular:     Rate and Rhythm: Normal rate and regular rhythm.     Pulses: Normal pulses.     Heart sounds:  Normal heart sounds. No murmur heard. Pulmonary:     Effort: Pulmonary effort is normal. No respiratory distress.     Breath sounds: Normal breath sounds. No wheezing or rales.  Abdominal:     General: Bowel sounds are normal. There is no distension.     Palpations: Abdomen is soft. There is no mass.     Tenderness: There is no abdominal tenderness.  Musculoskeletal:        General: No tenderness. Normal range of motion.     Cervical back: Normal range of motion and neck supple.     Right lower leg: No edema.     Left lower leg: No edema.     Comments: Upper / Lower Extremities: - Normal muscle tone, strength bilateral upper extremities 5/5, lower extremities 5/5  Lymphadenopathy:     Cervical: No cervical adenopathy.  Skin:    General: Skin is warm and dry.     Findings: No erythema or rash.  Neurological:     Mental Status: She is alert and oriented to person, place, and time.     Comments: Distal sensation intact to light touch all extremities  Psychiatric:  Mood and Affect: Mood normal.        Behavior: Behavior normal.        Thought Content: Thought content normal.     Comments: Well groomed, good eye contact, normal speech and thoughts     Results for orders placed or performed in visit on 07/19/24  T4, free   Collection Time: 07/19/24  8:13 AM  Result Value Ref Range   Free T4 1.1 0.8 - 1.8 ng/dL  Comprehensive metabolic panel with GFR   Collection Time: 07/19/24  8:13 AM  Result Value Ref Range   Glucose, Bld 101 (H) 65 - 99 mg/dL   BUN 24 7 - 25 mg/dL   Creat 9.02 9.49 - 8.94 mg/dL   eGFR 65 > OR = 60 fO/fpw/8.26f7   BUN/Creatinine Ratio SEE NOTE: 6 - 22 (calc)   Sodium 140 135 - 146 mmol/L   Potassium 3.8 3.5 - 5.3 mmol/L   Chloride 103 98 - 110 mmol/L   CO2 29 20 - 32 mmol/L   Calcium 9.6 8.6 - 10.4 mg/dL   Total Protein 7.2 6.1 - 8.1 g/dL   Albumin 4.2 3.6 - 5.1 g/dL   Globulin 3.0 1.9 - 3.7 g/dL (calc)   AG Ratio 1.4 1.0 - 2.5 (calc)   Total  Bilirubin 0.3 0.2 - 1.2 mg/dL   Alkaline phosphatase (APISO) 51 37 - 153 U/L   AST 17 10 - 35 U/L   ALT 17 6 - 29 U/L  CBC with Differential/Platelet   Collection Time: 07/19/24  8:13 AM  Result Value Ref Range   WBC 6.2 3.8 - 10.8 Thousand/uL   RBC 4.13 3.80 - 5.10 Million/uL   Hemoglobin 12.2 11.7 - 15.5 g/dL   HCT 61.9 64.9 - 54.9 %   MCV 92.0 80.0 - 100.0 fL   MCH 29.5 27.0 - 33.0 pg   MCHC 32.1 32.0 - 36.0 g/dL   RDW 86.8 88.9 - 84.9 %   Platelets 372 140 - 400 Thousand/uL   MPV 11.4 7.5 - 12.5 fL   Neutro Abs 3,677 1,500 - 7,800 cells/uL   Absolute Lymphocytes 1,333 850 - 3,900 cells/uL   Absolute Monocytes 905 200 - 950 cells/uL   Eosinophils Absolute 217 15 - 500 cells/uL   Basophils Absolute 68 0 - 200 cells/uL   Neutrophils Relative % 59.3 %   Total Lymphocyte 21.5 %   Monocytes Relative 14.6 %   Eosinophils Relative 3.5 %   Basophils Relative 1.1 %  Hemoglobin A1c   Collection Time: 07/19/24  8:13 AM  Result Value Ref Range   Hgb A1c MFr Bld 6.0 (H) <5.7 %   Mean Plasma Glucose 126 mg/dL   eAG (mmol/L) 7.0 mmol/L  Lipid panel   Collection Time: 07/19/24  8:13 AM  Result Value Ref Range   Cholesterol 186 <200 mg/dL   HDL 53 > OR = 50 mg/dL   Triglycerides 838 (H) <150 mg/dL   LDL Cholesterol (Calc) 105 (H) mg/dL (calc)   Total CHOL/HDL Ratio 3.5 <5.0 (calc)   Non-HDL Cholesterol (Calc) 133 (H) <130 mg/dL (calc)  TSH   Collection Time: 07/19/24  8:13 AM  Result Value Ref Range   TSH 1.30 0.40 - 4.50 mIU/L      Assessment & Plan:   Problem List Items Addressed This Visit     Acquired hypothyroidism   Relevant Medications   levothyroxine  (SYNTHROID ) 88 MCG tablet   liothyronine  (CYTOMEL ) 5 MCG tablet   BRCA2 gene mutation  positive in female   Essential hypertension   Relevant Medications   pravastatin  (PRAVACHOL ) 20 MG tablet   hydrochlorothiazide  (HYDRODIURIL ) 25 MG tablet   History of cerebrovascular accident (CVA) with residual deficit    Relevant Medications   pravastatin  (PRAVACHOL ) 20 MG tablet   Other Relevant Orders   AMB Referral VBCI Care Management   Mixed hyperlipidemia   Relevant Medications   pravastatin  (PRAVACHOL ) 20 MG tablet   hydrochlorothiazide  (HYDRODIURIL ) 25 MG tablet   Other Relevant Orders   AMB Referral VBCI Care Management   Restless leg syndrome   Relevant Medications   pramipexole  (MIRAPEX ) 0.75 MG tablet   Other Visit Diagnoses       Annual physical exam    -  Primary     Flu vaccine need       Relevant Orders   Flu vaccine HIGH DOSE PF(Fluzone Trivalent) (Completed)     Need for Streptococcus pneumoniae vaccination       Relevant Orders   Pneumococcal conjugate vaccine 20-valent (Completed)     Postmenopausal estrogen deficiency       Relevant Orders   DG Bone Density        Updated Health Maintenance information Reviewed recent lab results with patient Encouraged improvement to lifestyle with diet and exercise Goal of weight loss   Adult Wellness Visit Annual wellness visit conducted. Discussed general health maintenance, including vaccinations and screenings. - Administered flu shot today. - Administered Prevnar 20 pneumonia vaccine today. - Ordered bone density scan. - She will need refer to GYN for routine Pap smear.  Immunization management Reviewed immunization status. Discussed pneumonia vaccine, recommended for those aged 97 and older. COVID booster and tetanus are optional. - Administered Prevnar 20 pneumonia vaccine today.  Vulvovaginal candidiasis (yeast infection) Symptoms of itching and burning consistent with yeast infection. - Prescribed Diflucan for yeast infection.  Acute sinusitis Symptoms consistent with sinusitis for three weeks. Previous sinus infection treated with azithromycin. - Prescribed azithromycin for sinusitis.  Mixed hyperlipidemia with history of cerebrovascular disease (stroke) LDL cholesterol at 105 mg/dL, improved but not at goal of  70 mg/dL due to cerebrovascular disease. Discussed potential interventions including increasing pravastatin  dose, switching medication, or adding Repatha. 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 Will refer to [facility] and look into coverage for her and PA approval - Continue pravastatin  20 mg for now. Anticipate combine therapy and then in future if eligible may reduce statin or DC  Prediabetes A1c at 6.0%, indicating mild prediabetes. Previous A1c levels have been stable. - Continue monitoring A1c levels.  Hypothyroidism TSH and T4 levels are within normal range, indicating well-managed hypothyroidism. - Continue levothyroxine  88 mcg.  Genetic susceptibility to malignant neoplasm of breast (BRCA2 positive) BRCA2 positive status confirmed.  Discussed high-risk screening options including alternating MRI and mammogram every six months.   Recommended referral to GYN for further management and discussion of ovarian cancer risk. - Implement high-risk screening protocol with alternating MRI and mammogram every six months.  Overactive bladder (suspected) Symptoms consistent with overactive bladder. Previous discussion about referral to urologist noted.     Additional referrals discussed she will request referrals when ready to Urology and GYN as stated above  Orders Placed This Encounter  Procedures   DG Bone Density    Standing Status:   Future    Expiration Date:   07/26/2025    Reason for Exam (SYMPTOM  OR DIAGNOSIS REQUIRED):   postmenopausal estrogen  deficiency    Preferred imaging location?:   [facility]   Flu vaccine HIGH DOSE PF(Fluzone Trivalent)   Pneumococcal conjugate vaccine 20-valent   AMB Referral VBCI Care Management    Referral Priority:   Routine    Referral Type:   Consultation    Referral Reason:   Care Coordination    Number of Visits Requested:   1    Meds ordered this encounter   Medications   levothyroxine  (SYNTHROID ) 88 MCG tablet    Sig: Take 1 tablet (88 mcg total) by mouth daily before breakfast.    Dispense:  90 tablet    Refill:  3    Add future refills   liothyronine  (CYTOMEL ) 5 MCG tablet    Sig: Take 1 tablet (5 mcg total) by mouth daily.    Dispense:  90 tablet    Refill:  3    Add future refills on file   pramipexole  (MIRAPEX ) 0.75 MG tablet    Sig: Take 1 tablet (0.75 mg total) by mouth at bedtime.    Dispense:  90 tablet    Refill:  3   pravastatin  (PRAVACHOL ) 20 MG tablet    Sig: Take 1 tablet (20 mg total) by mouth daily.    Dispense:  90 tablet    Refill:  3    Add refills on file   hydrochlorothiazide  (HYDRODIURIL ) 25 MG tablet    Sig: Take 1 tablet (25 mg total) by mouth daily.    Dispense:  90 tablet    Refill:  3     Follow up plan: Return in about 6 months (around 01/23/2025) for 6 month PreDM A1c, Cholesterol, GYN Uro updates.   Marsa Officer, DO [facility] 07/26/2024, 2:03 PM

## 2024-07-27 NOTE — Progress Notes (Signed)
 Sherry Oliver                                          MRN: [id number]   07/27/2024   The VBCI Quality Team Specialist reviewed this patient medical record for the purposes of chart review for care gap closure. The following were reviewed: abstraction for care gap closure-controlling blood pressure.    VBCI Quality Team

## 2024-07-28 ENCOUNTER — Telehealth: Payer: Self-pay

## 2024-07-28 NOTE — Progress Notes (Signed)
 Care Guide Pharmacy Note  07/28/2024 Name: Sherry Oliver MRN: [id number] DOB: 04-07-1961  Referred By: Edman Marsa PARAS, DO Reason for referral: Complex Care Management (Outreach to schedule with Pharm d )   Sherry Oliver is a 63 y.o. year old female who is a primary care patient of Edman Marsa PARAS, DO.  Sherry Oliver was referred to the pharmacist for assistance related to: HLD  An unsuccessful telephone outreach was attempted today to contact the patient who was referred to the pharmacy team for assistance with medication assistance. Additional attempts will be made to contact the patient.  Jeoffrey Buffalo , RMA     [facility]  [facility] Guide  Direct Dial: [phone]  Website: [email or web address]

## 2024-07-31 ENCOUNTER — Other Ambulatory Visit: Admitting: Pharmacist

## 2024-07-31 ENCOUNTER — Encounter: Payer: Self-pay | Admitting: Pharmacist

## 2024-07-31 DIAGNOSIS — G720 Drug-induced myopathy: Secondary | ICD-10-CM

## 2024-07-31 DIAGNOSIS — I6930 Unspecified sequelae of cerebral infarction: Secondary | ICD-10-CM

## 2024-07-31 DIAGNOSIS — E782 Mixed hyperlipidemia: Secondary | ICD-10-CM

## 2024-07-31 NOTE — Progress Notes (Signed)
 07/31/2024 Name: SANJNA HASKEW MRN: [id number] DOB: [birth year]  Chief Complaint  Patient presents with   Medication Assistance    SHIREE ALTEMUS is a 63 y.o. year old female who presented for a telephone visit.   They were referred to the pharmacist by their PCP for assistance in managing medication access for Repatha SureClick injection.   Reach patient only briefly today.  From review of chart, note patient with History Cerebrovascular disease and hyperlipidemia. Reports currently taking pravastatin  20 mg daily. 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     aspirin  81 MG chewable tablet Chew 1 tablet (81 mg total) by mouth daily. 90 tablet 1   azithromycin (ZITHROMAX Z-PAK) 250 MG tablet Take 2 tabs (500mg  total) on Day 1. Take 1 tab (250mg ) daily for next 4 days. 6 tablet 0   b complex vitamins tablet Take 1 tablet by mouth daily.     Calcium Carbonate-Vitamin D 600-400 MG-UNIT per tablet Take 1 tablet by mouth daily.     clotrimazole-betamethasone (LOTRISONE) cream Apply topically 2 (two) times daily.     desoximetasone  (TOPICORT ) 0.25 % cream Apply to affected areas one to two times daily as needed until rash improved. Avoid applying to face, groin, and axilla. 60 g 3   Dupilumab  (DUPIXENT ) 300 MG/2ML SOAJ INJECT 300 MG UNDER THE SKIN EVERY 14 DAYS 12 mL 1   esomeprazole (NEXIUM) 20 MG  capsule Take 20 mg by mouth in the morning and at bedtime.     fluconazole (DIFLUCAN) 150 MG tablet Take one tablet by mouth on Day 1. Repeat dose 2nd tablet on Day 3. 2 tablet 0   hydrochlorothiazide  (HYDRODIURIL ) 25 MG tablet Take 1 tablet (25 mg total) by mouth daily. 90 tablet 3   ibuprofen (ADVIL,MOTRIN) 200 MG tablet Take 400 mg by mouth 2 (two) times daily.     levothyroxine  (SYNTHROID ) 88 MCG tablet Take 1 tablet (88 mcg total) by mouth daily before breakfast. 90 tablet 3   liothyronine  (CYTOMEL ) 5 MCG tablet Take 1 tablet (5 mcg total) by mouth daily. 90 tablet 3   Multiple Vitamin (MULTIVITAMIN WITH MINERALS) TABS tablet Take 1 tablet by mouth daily.     pramipexole  (MIRAPEX ) 0.75 MG tablet Take 1 tablet (0.75 mg total) by mouth at bedtime. 90 tablet 3   pravastatin  (PRAVACHOL ) 20 MG tablet Take 1 tablet (20 mg total) by mouth daily. 90 tablet 3   tacrolimus  (PROTOPIC ) 0.1 % ointment Apply topically daily. 60 g 5   zinc sulfate 220 (50 Zn) MG capsule Take 220 mg by mouth daily.     No current facility-administered medications on file prior to visit.    Assessment/Plan:   Speak with patient only briefly today.  Consult with Cardiology Clinical Pharmacist Melissa D. Geraldine, PharmD, BCACP, CPP with [facility] regarding this case who agrees with recommendation for patient to start Repatha in addition to pravastatin  for this patient - Start Repatha Sureclick prior authorization for patient via CoverMyMeds today - Note patient with latex allergy . If PA approved, will plan to order latex-free form of Repatha Sureclick - Send patient the link for the How to Use the SureClick Autoinjector video as well as Product/process Development Scientist for Use Handout via MyChart to review while waiting on approval  Follow Up Plan: Clinical Pharmacist will follow up with patient by telephone within the next 7 days  Sharyle Sia, PharmD, [facility] [phone]

## 2024-07-31 NOTE — Patient Instructions (Signed)
 It was a pleasure speaking with you briefly today. We are working on getting you approved to start Repatha.    Repatha is used to lower LDL (bad cholesterol)   The device is a single-dose disposable pen. This pen can only be used for 1 single injection, and must be discarded after use   The medicine is injected under your skin and can be given by yourself or someone else (caregiver)   The most common side effects of Repatha include side effects of runny nose, sore throat, symptoms of the common cold, flu or flu-like symptoms, back pain and redness, pain, or bruising at the injection site   Tell your healthcare provider if you have any side effect that bothers you or that does not go away.    Stop taking Repatha and call your healthcare provider or seek emergency help right away if you have any of these symptoms: trouble breathing or swallowing, raised bumps (hives), rash or itching, swelling of the face, lips, tongue, throat or arms   In the meantime please review the following How to Use video and Instruction Handout at the following links below and please let me know what questions you have about the medication:   [email or web address]   [email or web address]-[email or web address]   I will reach out to you again when I have an update regarding approval for this medication through your insurance.   Thank you!   Sharyle Sia, PharmD, JAQUELINE, CPP Clinical Pharmacist [facility] [phone]

## 2024-08-01 ENCOUNTER — Other Ambulatory Visit (HOSPITAL_COMMUNITY): Payer: Self-pay

## 2024-08-01 ENCOUNTER — Telehealth: Payer: Self-pay

## 2024-08-01 ENCOUNTER — Telehealth: Payer: Self-pay | Admitting: Pharmacy Technician

## 2024-08-01 NOTE — Telephone Encounter (Signed)
 Pharmacy Patient Advocate Encounter   Received notification from Pt Calls Messages that prior authorization for Repatha SureClick is required/requested.   Insurance verification completed.   The patient is insured through [facility].   Per test claim: The current 28 day co-pay is, $45.  No PA needed at this time. This test claim was processed through [facility]- copay amounts may vary at other pharmacies due to pharmacy/plan contracts, or as the patient moves through the different stages of their insurance plan.

## 2024-08-01 NOTE — Telephone Encounter (Signed)
 Copied from CRM [id number]. Topic: Clinical - Medication Prior Auth >> Aug 01, 2024 10:14 AM Sherry Oliver wrote: Reason for CRM: patient medication was approved for a year starting yesterday   Phone:[phone] option 5

## 2024-08-02 ENCOUNTER — Other Ambulatory Visit: Admitting: Pharmacist

## 2024-08-02 ENCOUNTER — Encounter: Payer: Self-pay | Admitting: Family Medicine

## 2024-08-02 ENCOUNTER — Telehealth: Payer: Self-pay

## 2024-08-02 ENCOUNTER — Other Ambulatory Visit (HOSPITAL_COMMUNITY): Payer: Self-pay

## 2024-08-02 DIAGNOSIS — I6930 Unspecified sequelae of cerebral infarction: Secondary | ICD-10-CM

## 2024-08-02 DIAGNOSIS — G720 Drug-induced myopathy: Secondary | ICD-10-CM

## 2024-08-02 DIAGNOSIS — E782 Mixed hyperlipidemia: Secondary | ICD-10-CM

## 2024-08-02 MED ORDER — REPATHA SURECLICK 140 MG/ML ~~LOC~~ SOAJ: 140.0000 mg | SUBCUTANEOUS | 1 refills | Status: AC

## 2024-08-02 NOTE — Telephone Encounter (Signed)
 Copied from CRM [id number]. Topic: Clinical - Medication Question >> Aug 02, 2024 10:10 AM Joesph NOVAK wrote: Reason for CRM: Michaelle from [facility] is calling to speak with someone in regards to a manufacture change for medication levothyroxine  (SYNTHROID ) 88 MCG tablet [[id number]].  To avoid the prescription being cancelled, someone needs to reach out to pharmacy by tomorrow.   PH: [phone].   REF#: [id number]

## 2024-08-02 NOTE — Telephone Encounter (Signed)
 Spoke to [employer] at [employer] has changed. No notes to indicate a specific manufacturer is needed. Gave verbal ok to change.

## 2024-08-02 NOTE — Patient Instructions (Signed)
 Repatha  is used to lower LDL (bad cholesterol)   The device is a single-dose disposable pen. This pen can only be used for 1 single injection, and must be discarded after use   The medicine is injected under your skin and can be given by yourself or someone else (caregiver)   The most common side effects of Repatha  include side effects of runny nose, sore throat, symptoms of the common cold, flu or flu-like symptoms, back pain and redness, pain, or bruising at the injection site   Tell your healthcare provider if you have any side effect that bothers you or that does not go away.    Stop taking Repatha  and call your healthcare provider or seek emergency help right away if you have any of these symptoms: trouble breathing or swallowing, raised bumps (hives), rash or itching, swelling of the face, lips, tongue, throat or arms   In the meantime please review the following How to Use video and Instruction Handout at the following links below and please let me know what questions you have about the medication:   [email or web address] [email or web address]   [email or web address]/[email or web address]-[email or web address]_[email or web address]_hcp_pt_[email or web address]   I will reach out to you again when I have an update regarding approval for this medication through your insurance.   Thank you!   Sharyle Sia, PharmD, JAQUELINE, CPP Clinical Pharmacist [facility] [phone]

## 2024-08-02 NOTE — Progress Notes (Signed)
 08/02/2024 Name: Sherry Oliver MRN: [id number] DOB: 06/04/1961  Chief Complaint  Patient presents with   Medication Assistance    Sherry Oliver is a 63 y.o. year old female who presented for a telephone visit.   They were referred to the pharmacist by their PCP for assistance in managing medication access for Repatha  SureClick injection.    Subjective:  Care Team: Primary Care Provider: Edman Marsa PARAS, DO ; Next Scheduled Visit: 01/23/2025  Medication Access/Adherence  Current Pharmacy:  [facility] - [place], [place] - [address] [place] [address] Phone: [phone] Fax: [phone]  [facility] - [place], [place] - [address] Sherry Oliver Sherry Oliver [address] [place] [address] Phone: [phone] Fax: [phone]  [facility] - [place], [place] - [address] [facility] [address] [place] [address] Phone: [phone] Fax: [phone]  [facility] [address] [place] - [address] [place] [address] Phone: [phone] Fax: [phone]   Patient reports affordability concerns with their medications: Yes  Patient reports access/transportation concerns to their pharmacy: No  Patient reports adherence concerns with their medications:  No    Submitted request for prior authorization for Repatha  Sureclick through patient's BlueMedicare plan on 07/31/2024 - PA approved through 07/31/2025 - Note patient has allergy  to latex; will need to order latex-free form of Repatha  Sureclick  Hyperlipidemia/ASCVD Risk Reduction  Current lipid lowering medications: pravastatin  20 mg daily Medications tried in the past: Previous therapies tried: rosuvastatin, Livalo, atorvastatin & simvastatin - 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 90 tablet 3   Ascorbic Acid (VITAMIN C) 1000 MG tablet Take 1,000 mg by mouth daily.     aspirin  81 MG chewable tablet Chew 1 tablet (81 mg total) by mouth daily. 90 tablet 1   azithromycin  (ZITHROMAX  Z-PAK) 250 MG tablet Take 2 tabs (500mg  total) on Day 1. Take 1 tab (250mg ) daily for next 4 days. 6 tablet 0   b complex vitamins tablet Take 1 tablet by mouth daily.     Calcium Carbonate-Vitamin D 600-400 MG-UNIT per tablet Take 1 tablet by mouth daily.     clotrimazole-betamethasone (LOTRISONE) cream Apply topically 2 (two) times daily.     desoximetasone  (TOPICORT ) 0.25 % cream Apply to affected areas one to two times daily as needed until rash improved. Avoid applying to face, groin, and axilla. 60 g 3   Dupilumab  (DUPIXENT ) 300 MG/2ML SOAJ INJECT 300 MG UNDER THE SKIN EVERY 14 DAYS 12 mL 1   esomeprazole (NEXIUM) 20 MG capsule Take 20 mg by mouth in the morning and at bedtime.     fluconazole  (DIFLUCAN ) 150 MG tablet Take one tablet by mouth on Day 1. Repeat dose 2nd tablet  on Day 3. 2 tablet 0   hydrochlorothiazide  (HYDRODIURIL ) 25 MG tablet Take 1 tablet (25 mg total) by mouth daily. 90 tablet 3   ibuprofen (ADVIL,MOTRIN) 200 MG tablet Take 400 mg by mouth 2 (two) times daily.     levothyroxine  (SYNTHROID ) 88 MCG tablet Take 1 tablet (88 mcg total) by mouth daily before breakfast. 90 tablet 3   liothyronine  (CYTOMEL ) 5 MCG tablet Take 1 tablet (5 mcg total) by mouth daily. 90 tablet 3   Multiple Vitamin (MULTIVITAMIN WITH MINERALS) TABS tablet  Take 1 tablet by mouth daily.     pramipexole  (MIRAPEX ) 0.75 MG tablet Take 1 tablet (0.75 mg total) by mouth at bedtime. 90 tablet 3   tacrolimus  (PROTOPIC ) 0.1 % ointment Apply topically daily. 60 g 5   zinc sulfate 220 (50 Zn) MG capsule Take 220 mg by mouth daily.     No current facility-administered medications on file prior to visit.       Assessment/Plan:   Hyperlipidemia/ASCVD Risk Reduction: - Currently uncontrolled.  - Reviewed long term complications of uncontrolled cholesterol - PA for Repatha SureClick approved through 07/31/2025 - Based on reported income, patient does not currently meet criteria for [employer]- Hypercholesteremia Medicare access grant, but counseled about option to apply in future if income changes and grant is open - Counseled on Repatha Sureclick, including mechanism of action, side effects, and benefits.   Note patient has allergy  to latex; will need to order latex-free form of Repatha Sureclick  Counseled on potential side effects of runny nose, sore throat, symptoms of the common cold, flu or flu-like symptoms, back pain and redness, pain, or bruising at the injection site. Advised to contact our office with if experiences any side effect that are bothersome or do not go away. Advise patient to seek emergency care if experiences any symptoms of allergic reaction, such as trouble breathing or swallowing, raised bumps (hives), rash or itching, swelling of the face, lips, tongue, throat or arms. Patient verbalized understanding. - Have sent patient the link for the How to Use the [facility] Autoinjector video as well as manufacturer Instructions for Use Handout via MyChart - Patient requests to receive Repatha SureClick through her [facility] mail order pharmacy  Will collaborate with PCP States will contact [facility] pharmacy to confirm pharmacy aware of her latex allergy  and confirm pharmacy that product will be latex free prior to  shipping - As requested by patient, schedule follow up lipid panel recheck in March  Follow Up Plan:  - Lab appointment scheduled for patient on 11/22/2024 at 8:30 am - Patient denies further medication questions or concerns today - Provide patient with contact information for clinic pharmacist to contact if needed in future for medication questions/concerns   Sharyle Sia, PharmD, Sherry Oliver, CPP Clinical Pharmacist [facility] [phone]

## 2024-08-06 NOTE — Progress Notes (Unsigned)
 08/07/2024 9:31 PM   Sherry Oliver Jun 21, 1961 [id number]  Referring provider: Edman Marsa PARAS, DO [address] [place],  [place] [address]  Urological history: 1.     No chief complaint on file.   HPI: Sherry Oliver is a 63 y.o. woman who presents today as a referral from Dr. Edman for OAB.  Previous records reviewed.  Patient states that she has had urinary incontinence for ***.  Patient has incontinence with ***.   She is experiencing *** incontinent episodes during the day. She is experiencing *** incontinent episodes during the night.  Her incontinence volume is ***.   She is wearing *** pads/depends daily.    She is having associated urinary frequency, urgency, dysuria, nocturia, intermittency, hesitancy, straining to urinate and weak urinary stream.   ***  She is not having associated urinary frequency, urgency, dysuria, nocturia, intermittency, hesitancy, straining to urinate and weak urinary stream.   ***  She does/does not have a history of urinary tract infections, STI's or injury to the bladder. ***  She denies/endorses dysuria, gross hematuria, suprapubic pain, back pain, abdominal pain or flank pain.***  She denies/endorses dysuria, gross hematuria, suprapubic pain, back pain, abdominal pain or flank pain.***  She has not had any recent fevers, chills, nausea or vomiting. ***  She has not had any recent fevers, chills, nausea or vomiting. ***  She does/does not have a history of nephrolithiasis, GU surgery or GU trauma. ***  She does/does not have a history of nephrolithiasis, GU surgery or GU trauma. ***  She is/is not sexually active.  She has/has not noted incontinence with sexual intercourse.  ***   She is post menopausal. ***  She admits to/denies constipation and/or diarrhea. ***  She is having/ not having pain with bladder filling.  ***  She has/not had any recent imaging studies.  ***  She is drinking *** of water  daily.   She is drinking *** caffeinated beverages daily.  She is drinking *** alcoholic beverages daily.    Her risk factors for incontinence are obesity, para T, vaginal delivery, a family history of incontinence, age, smoking, caffeine, diabetes, stroke, depression, fecal incontinence, vaginal atrophy, oral estrogens, pelvic surgery, pelvic radiation and/or neurological disorders. ***  She is taking antihistamines, decongestants, benzo's, opioids, OAB medication, ACE inhibitors, alpha-agonists, alpha blockers, antiarrhythmics, diuretics, antidepressants, antipsychotics, skeletal muscle relaxants and/or oral estrogen.    UA ***  PVR ***  Hemoglobin A1c of 6.0  Serum creatinine of 0.97  PMH: Past Medical History:  Diagnosis Date   Allergy     Gastropathy    GERD (gastroesophageal reflux disease)    Headache    Hyperlipidemia    Hypertension    Hypothyroid    Lumbar radiculopathy    Lung nodule    Sleep apnea     Surgical History: Past Surgical History:  Procedure Laterality Date   APPENDECTOMY  2008   BLADDER SURGERY  1965   bladder stem   BREAST BIOPSY Left 06/06/2019   Lymph node US  Bx, hydromarker. REACTIVE LYMPH NODE, FAVOR BENIGN   COLONOSCOPY WITH PROPOFOL  N/A 02/19/2015   Procedure: COLONOSCOPY WITH PROPOFOL ;  Surgeon: Gladis RAYMOND Mariner, MD;  Location: [facility] ENDOSCOPY;  Service: Endoscopy;  Laterality: N/A;   COLONOSCOPY WITH PROPOFOL  N/A 03/24/2018   Procedure: COLONOSCOPY WITH PROPOFOL ;  Surgeon: Mariner Gladis RAYMOND, MD;  Location: [facility] ENDOSCOPY;  Service: Endoscopy;  Laterality: N/A;   ELBOW SURGERY Right 1982   ESOPHAGOGASTRODUODENOSCOPY  FOOT SURGERY Left 2007   neuroma removal, repeat 2008   OPEN REDUCTION INTERNAL FIXATION (ORIF) DISTAL RADIAL FRACTURE Right 03/24/2022   Procedure: ORIF right distal radius fracture;  Surgeon: Kathlynn Sharper, MD;  Location: [facility] ORS;  Service: Orthopedics;  Laterality: Right;    Home Medications:  Allergies as of 08/07/2024        Reactions   Balsam Other (See Comments)   Positive patch test   Benzalkonium Chloride Other (See Comments)   Positive patch test   Bronopol Other (See Comments)   2-Bromo-2-Nitropropane-1,3-Diol (Bronopol)-Positive patch test   Chlorzoxazone Nausea And Vomiting   Chromium Other (See Comments)   Potassium dichromate-Positive patch test   Cocamidopropyl Betaine Other (See Comments)   Positive patch test   Crestor [rosuvastatin]    Tired and depressed   Elemental Sulfur Other (See Comments)   Hyperactive    Glutaral Other (See Comments)   Positive patch test   Latex Swelling   Methyldibromoglutaronitrile Other (See Comments)   Positive patch test   Propylene Glycol Other (See Comments)   Positive patch test   Sulfa Antibiotics Other (See Comments)   Hyperactivity   Tomato    Benzocaine Rash   Cobalt Rash   Dibucaine Rash   Formaldehyde Rash   Nickel Sulfate [nickel] Rash   Other Other (See Comments), Rash   Uncoded Allergy . Allergen: fragrance mix Uncoded Allergy . Allergen: ethylenediamine dihydrochloride Paraben mix-Positive patch test   Penicillins Rash   Potassium Dichromate Rash   Tetracaine Rash        Medication List        Accurate as of August 06, 2024  9:31 PM. If you have any questions, ask your nurse or doctor.          aspirin  81 MG chewable tablet Chew 1 tablet (81 mg total) by mouth daily.   azithromycin  250 MG tablet Commonly known as: Zithromax  Z-Pak Take 2 tabs (500mg  total) on Day 1. Take 1 tab (250mg ) daily for next 4 days.   b complex vitamins tablet Take 1 tablet by mouth daily.   Calcium Carbonate-Vitamin D 600-400 MG-UNIT tablet Take 1 tablet by mouth daily.   clotrimazole-betamethasone cream Commonly known as: LOTRISONE Apply topically 2 (two) times daily.   desoximetasone  0.25 % cream Commonly known as: TOPICORT  Apply to affected areas one to two times daily as needed until rash improved. Avoid applying to face,  groin, and axilla.   Dupixent  300 MG/2ML Soaj Generic drug: Dupilumab  INJECT 300 MG UNDER THE SKIN EVERY 14 DAYS   esomeprazole 20 MG capsule Commonly known as: NEXIUM Take 20 mg by mouth in the morning and at bedtime.   fluconazole  150 MG tablet Commonly known as: DIFLUCAN  Take one tablet by mouth on Day 1. Repeat dose 2nd tablet on Day 3.   hydrochlorothiazide  25 MG tablet Commonly known as: HYDRODIURIL  Take 1 tablet (25 mg total) by mouth daily.   ibuprofen 200 MG tablet Commonly known as: ADVIL Take 400 mg by mouth 2 (two) times daily.   levothyroxine  88 MCG tablet Commonly known as: SYNTHROID  Take 1 tablet (88 mcg total) by mouth daily before breakfast.   liothyronine  5 MCG tablet Commonly known as: CYTOMEL  Take 1 tablet (5 mcg total) by mouth daily.   multivitamin with minerals Tabs tablet Take 1 tablet by mouth daily.   pramipexole  0.75 MG tablet Commonly known as: MIRAPEX  Take 1 tablet (0.75 mg total) by mouth at bedtime.   pravastatin  20 MG tablet Commonly known  as: PRAVACHOL  Take 1 tablet (20 mg total) by mouth daily.   Repatha  SureClick 140 MG/ML Soaj Generic drug: Evolocumab  Inject 140 mg into the skin every 14 (fourteen) days. Patient with latex allergy . Please use Latex-free form - NDC: [phone]   tacrolimus  0.1 % ointment Commonly known as: PROTOPIC  Apply topically daily.   vitamin C 1000 MG tablet Take 1,000 mg by mouth daily.   zinc sulfate (50mg  elemental zinc) 220 (50 Zn) MG capsule Take 220 mg by mouth daily.        Allergies:  Allergies  Allergen Reactions   Balsam Other (See Comments)    Positive patch test   Benzalkonium Chloride Other (See Comments)    Positive patch test   Bronopol Other (See Comments)    2-Bromo-2-Nitropropane-1,3-Diol (Bronopol)-Positive patch test   Chlorzoxazone Nausea And Vomiting   Chromium Other (See Comments)    Potassium dichromate-Positive patch test   Cocamidopropyl Betaine Other (See  Comments)    Positive patch test   Crestor [Rosuvastatin]     Tired and depressed   Elemental Sulfur Other (See Comments)    Hyperactive    Glutaral Other (See Comments)    Positive patch test   Latex Swelling   Methyldibromoglutaronitrile Other (See Comments)    Positive patch test   Propylene Glycol Other (See Comments)    Positive patch test   Sulfa Antibiotics Other (See Comments)    Hyperactivity   Tomato    Benzocaine Rash   Cobalt Rash   Dibucaine Rash   Formaldehyde Rash   Nickel Sulfate [Nickel] Rash   Other Other (See Comments) and Rash    Uncoded Allergy . Allergen: fragrance mix Uncoded Allergy . Allergen: ethylenediamine dihydrochloride  Paraben mix-Positive patch test   Penicillins Rash   Potassium Dichromate Rash   Tetracaine Rash    Family History: Family History  Problem Relation Age of Onset   Breast cancer Mother 79       bilateral   BRCA 1/2 Mother        positive for Brca 2   Cervical cancer Mother 87   Bladder Cancer Mother 70   Alcohol abuse Father    Lung cancer Father 60       mets to brain, smoking hx   Breast cancer Maternal Aunt 40 - 50   Cancer - Other Maternal Aunt        adrenal tumor   Heart disease Maternal Grandmother    Heart attack Maternal Grandmother 62   Breast cancer Maternal Grandfather 61 - 79   Stroke Paternal Grandmother    Heart attack Maternal Uncle    Heart disease Maternal Cousin    Heart disease Maternal Cousin    Melanoma Brother        arms   Birth defects Nephew    Heart attack Paternal Uncle    Stroke Paternal Aunt     Social History:  reports that she quit smoking about 6 years ago. Her smoking use included cigarettes. She has a 54 pack-year smoking history. She has quit using smokeless tobacco. She reports current alcohol use. She reports that she does not currently use drugs.  ROS: Pertinent ROS in HPI  Physical Exam: There were no vitals taken for this visit.  Constitutional:  Well nourished.  Alert and oriented, No acute distress. HEENT: [place] AT, moist mucus membranes.  Trachea midline, no masses. Cardiovascular: No clubbing, cyanosis, or edema. Respiratory: Normal respiratory effort, no increased work of breathing. GU: No CVA tenderness.  No bladder fullness or masses.  Recession of labia minora, dry, pale vulvar vaginal mucosa and loss of mucosal ridges and folds.  Normal urethral meatus, no lesions, no prolapse, no discharge.   No urethral masses, tenderness and/or tenderness. No bladder fullness, tenderness or masses. *** vagina mucosa, *** estrogen effect, no discharge, no lesions, *** pelvic support, *** cystocele and *** rectocele noted.  No cervical motion tenderness.  Uterus is freely mobile and non-fixed.  No adnexal/parametria masses or tenderness noted.  Anus and perineum are without rashes or lesions.   ***  Neurologic: Grossly intact, no focal deficits, moving all 4 extremities. Psychiatric: Normal mood and affect.    Laboratory Data: See Epic and HPI   I have reviewed the labs.   Pertinent Imaging: ***  Assessment & Plan:  ***  1. Mixed incontinence - Behavioral: Initiate bladder retraining and timed voiding and pelvic floor physical therapy referral for Kegel exercises and biofeedback *** - Pharmacologic: Trial of mirabegron 25 mg daily for urgency component *** and vaginal estrogen cream (estradiol  0.01%) 2x/week for urogenital atrophy - Follow-up: Return in 8-12 weeks to assess response to therapy, consider urodynamic testing if symptoms persist or worsen *** - refer to discuss surgical options (e.g., midurethral sling) if stress incontinence remains bothersome despite conservative measures *** - Education: Discussed nature of mixed incontinence, treatment options, and expected outcomes and patient expressed understanding and interest in conservative management initially   No follow-ups on file.  These notes generated with voice recognition software. I apologize  for typographical errors.  Sherry Oliver  [facility] [address]  [address] [place], [place] [address] [phone]

## 2024-08-07 ENCOUNTER — Ambulatory Visit: Admitting: Urology

## 2024-08-07 VITALS — BP 138/79 | HR 107 | Wt 160.0 lb

## 2024-08-07 DIAGNOSIS — N3281 Overactive bladder: Secondary | ICD-10-CM

## 2024-08-07 DIAGNOSIS — M25561 Pain in right knee: Secondary | ICD-10-CM | POA: Diagnosis not present

## 2024-08-07 DIAGNOSIS — N958 Other specified menopausal and perimenopausal disorders: Secondary | ICD-10-CM

## 2024-08-07 DIAGNOSIS — S82141D Displaced bicondylar fracture of right tibia, subsequent encounter for closed fracture with routine healing: Secondary | ICD-10-CM | POA: Diagnosis not present

## 2024-08-07 DIAGNOSIS — S83231A Complex tear of medial meniscus, current injury, right knee, initial encounter: Secondary | ICD-10-CM | POA: Diagnosis not present

## 2024-08-07 LAB — MICROSCOPIC EXAMINATION: Bacteria, UA: NONE SEEN

## 2024-08-07 LAB — URINALYSIS, COMPLETE
Bilirubin, UA: NEGATIVE
Glucose, UA: NEGATIVE
Ketones, UA: NEGATIVE
Leukocytes,UA: NEGATIVE
Nitrite, UA: NEGATIVE
Protein,UA: NEGATIVE
RBC, UA: NEGATIVE
Specific Gravity, UA: 1.025 (ref 1.005–1.030)
Urobilinogen, Ur: 0.2 mg/dL (ref 0.2–1.0)
pH, UA: 5.5 (ref 5.0–7.5)

## 2024-08-07 LAB — BLADDER SCAN AMB NON-IMAGING

## 2024-08-07 MED ORDER — ESTRADIOL 0.01 % VA CREA: TOPICAL_CREAM | VAGINAL | 12 refills | Status: AC

## 2024-08-07 MED ORDER — GEMTESA 75 MG PO TABS: 75.0000 mg | ORAL_TABLET | Freq: Every day | ORAL | 3 refills | Status: AC

## 2024-08-07 MED ORDER — GEMTESA 75 MG PO TABS: 75.0000 mg | ORAL_TABLET | Freq: Every day | ORAL | Status: AC

## 2024-08-09 ENCOUNTER — Encounter: Payer: Self-pay | Admitting: Urology

## 2024-08-28 DIAGNOSIS — I10 Essential (primary) hypertension: Secondary | ICD-10-CM | POA: Diagnosis not present

## 2024-08-28 DIAGNOSIS — I2510 Atherosclerotic heart disease of native coronary artery without angina pectoris: Secondary | ICD-10-CM | POA: Diagnosis not present

## 2024-08-28 DIAGNOSIS — R0609 Other forms of dyspnea: Secondary | ICD-10-CM | POA: Diagnosis not present

## 2024-08-28 DIAGNOSIS — E785 Hyperlipidemia, unspecified: Secondary | ICD-10-CM | POA: Diagnosis not present

## 2024-08-29 ENCOUNTER — Ambulatory Visit: Admitting: Dermatology

## 2024-08-29 DIAGNOSIS — Z79899 Other long term (current) drug therapy: Secondary | ICD-10-CM

## 2024-08-29 DIAGNOSIS — I781 Nevus, non-neoplastic: Secondary | ICD-10-CM

## 2024-08-29 DIAGNOSIS — I8393 Asymptomatic varicose veins of bilateral lower extremities: Secondary | ICD-10-CM | POA: Diagnosis not present

## 2024-08-29 DIAGNOSIS — L2089 Other atopic dermatitis: Secondary | ICD-10-CM | POA: Diagnosis not present

## 2024-08-29 DIAGNOSIS — L309 Dermatitis, unspecified: Secondary | ICD-10-CM

## 2024-08-29 DIAGNOSIS — Z7189 Other specified counseling: Secondary | ICD-10-CM

## 2024-08-29 MED ORDER — DESOXIMETASONE 0.25 % EX CREA: TOPICAL_CREAM | CUTANEOUS | 3 refills | Status: AC

## 2024-08-29 NOTE — Patient Instructions (Addendum)
 BEFORE YOUR APPOINTMENT FOR SCLEROTHERAPY  1. When you telephone for your appointment for the sclerotherapy procedure, please let the receptionist know that you are scheduling for the fifteen (15) minute sclerotherapy procedure not just a regular visit.  2. On the day of the procedure, please cleanse and dry the areas, but do not use any moisturizers or other products on the area(s) to be treated.  3. Bring a pair of comfortable shorts to wear during the procedure.  4. Be sure to bring your recommended graduated compression stockings with you to the office. You will be wearing them home when your visit is over. These compression hose can be purchased at most medical supply stores.  After Your Sclerotherapy Procedure  1. Please wear the graduated compression stockings for 24 hours immediately following the completion of the sclerotherapy procedure.  2. We recommend that you avoid vigorous activity as much as possible for the first twenty-four (24) hours. You can do your normal routine, but avoid an above normal amount of time on your feet. Elevating the legs when sitting and avoidance of vigorous leg movements or exercise in the first few days after treatment may improve your results.  3. You may remove the compression dressings (cotton balls) and tape the next morning.  4. Please continue wearing the compression stockings during waking hours for the two (2) weeks following sclerotherapy.  5. If you have any blisters, sores or ulcers or other problems following your procedure please call or return to the office immediately.     THE PROCEDURE FEE IS $350.00 PER FIFTEEN (15) MINUTE SESSION. PRICES MAY POSSIBLY INCREASE IN JAN 2026. WE REQUIRE THAT THIS PROCEDURE BE PAID FOR IN FULL ON OR BEFORE THE DATE THAT IT IS PERFORMED. WE WILL GIVE YOU A RECEIPT THAT YOU CAN FILE WITH YOUR INSURANCE COMPANY. WE GENERALLY DO NOT FILE THIS PROCEDURE WITH ANY INSURANCE COMPANY EXCEPT UNDER CERTAIN  CIRCUMSTANCES WHERE PRIOR AUTHORIZATION HAS BEEN CONFIRMED. THIS PROCEDURE IS GENERALLY CONSIDERED TO BE A COSMETIC PROCEDURE BY INSURANCE COMPANIES.  Due to recent changes in healthcare laws, you may see results of your pathology and/or laboratory studies on MyChart before the doctors have had a chance to review them. We understand that in some cases there may be results that are confusing or concerning to you. Please understand that not all results are received at the same time and often the doctors may need to interpret multiple results in order to provide you with the best plan of care or course of treatment. Therefore, we ask that you please give us  2 business days to thoroughly review all your results before contacting the office for clarification. Should we see a critical lab result, you will be contacted sooner.   If You Need Anything After Your Visit  If you have any questions or concerns for your doctor, please call our main line at [phone] and press option 4 to reach your doctor's medical assistant. If no one answers, please leave a voicemail as directed and we will return your call as soon as possible. Messages left after 4 pm will be answered the following business day.   You may also send us  a message via MyChart. We typically respond to MyChart messages within 1-2 business days.  For prescription refills, please ask your pharmacy to contact our office. Our fax number is [phone].  If you have an urgent issue when the clinic is closed that cannot wait until the next business day, you can page your doctor at  the number below.    Please note that while we do our best to be available for urgent issues outside of office hours, we are not available 24/7.   If you have an urgent issue and are unable to reach us , you may choose to seek medical care at your doctor's office, retail clinic, urgent care center, or emergency room.  If you have a medical emergency, please immediately  call 911 or go to the emergency department.  Pager Numbers  - Dr. Hester: [phone]  - Dr. Jackquline: [phone]  - Dr. Claudene: [phone]   - Dr. Raymund: [phone]  In the event of inclement weather, please call our main line at [phone] for an update on the status of any delays or closures.  Dermatology Medication Tips: Please keep the boxes that topical medications come in in order to help keep track of the instructions about where and how to use these. Pharmacies typically print the medication instructions only on the boxes and not directly on the medication tubes.   If your medication is too expensive, please contact our office at [phone] option 4 or send us  a message through MyChart.   We are unable to tell what your co-pay for medications will be in advance as this is different depending on your insurance coverage. However, we may be able to find a substitute medication at lower cost or fill out paperwork to get insurance to cover a needed medication.   If a prior authorization is required to get your medication covered by your insurance company, please allow us  1-2 business days to complete this process.  Drug prices often vary depending on where the prescription is filled and some pharmacies may offer cheaper prices.  The website www.goodrx.com contains coupons for medications through different pharmacies. The prices here do not account for what the cost may be with help from insurance (it may be cheaper with your insurance), but the website can give you the price if you did not use any insurance.  - You can print the associated coupon and take it with your prescription to the pharmacy.  - You may also stop by our office during regular business hours and pick up a GoodRx coupon card.  - If you need your prescription sent electronically to a different pharmacy, notify our office through [facility] or by phone at [phone] option 4.     Si Usted  Necesita Algo Despus de Su Visita  Tambin puede enviarnos un mensaje a travs de Clinical Cytogeneticist. Por lo general respondemos a los mensajes de MyChart en el transcurso de 1 a 2 das hbiles.  Para renovar recetas, por favor pida a su farmacia que se ponga en contacto con nuestra oficina. Randi lakes de fax es [place] [phone].  Si tiene un asunto urgente cuando la clnica est cerrada y que no puede esperar hasta el siguiente da hbil, puede llamar/localizar a su doctor(a) al nmero que aparece a continuacin.   Por favor, tenga en cuenta que aunque hacemos todo lo posible para estar disponibles para asuntos urgentes fuera del horario de [place], no estamos disponibles las 24 horas del da, los 7 [address]  [address] de la [place].   Si tiene un problema urgente y no puede comunicarse con nosotros, puede optar por buscar atencin mdica  en el consultorio de su doctor(a), en una clnica privada, en un centro de atencin urgente o en una sala de emergencias.  Si tiene una emergencia mdica, por favor llame inmediatamente al 911 o vaya a  la sala de emergencias.  Nmeros de bper  - Dr. Hester: [phone]  - Dra. Jackquline: [phone]  - Dr. Claudene: [phone]  - Dra. Kitts: [phone]  En caso de inclemencias del [place], por favor llame a nuestra lnea principal al [phone] para una actualizacin sobre el estado de cualquier retraso o cierre.  Consejos para la medicacin en dermatologa: Por favor, guarde las cajas en las que vienen los medicamentos de uso tpico para ayudarle a seguir las instrucciones sobre dnde y cmo usarlos. Las farmacias generalmente imprimen las instrucciones del medicamento slo en las cajas y no directamente en los tubos del [place].   Si su medicamento es muy caro, por favor, pngase en contacto con landry rieger llamando al [phone] y presione la opcin 4 o envenos un mensaje a travs de Clinical Cytogeneticist.   No podemos decirle cul ser su copago por los medicamentos  por adelantado ya que esto es diferente dependiendo de la cobertura de su seguro. Sin embargo, es posible que podamos encontrar un medicamento sustituto a audiological scientist un formulario para que el seguro cubra el medicamento que se considera necesario.   Si se requiere una autorizacin previa para que su compaa de seguros cubra su medicamento, por favor permtanos de 1 a 2 das hbiles para completar este proceso.  Los precios de los medicamentos varan con frecuencia dependiendo del environmental consultant de dnde se surte la receta y alguna farmacias pueden ofrecer precios ms baratos.  El sitio web www.goodrx.com tiene cupones para medicamentos de health and safety inspector. Los precios aqu no tienen en cuenta lo que podra costar con la ayuda del seguro (puede ser ms barato con su seguro), pero el sitio web puede darle el precio si no utiliz tourist information centre manager.  - Puede imprimir el cupn correspondiente y llevarlo con su receta a la farmacia.  - Tambin puede pasar por nuestra oficina durante el horario de atencin regular y education officer, museum una tarjeta de cupones de GoodRx.  - Si necesita que su receta se enve electrnicamente a una farmacia diferente, informe a nuestra oficina a travs de MyChart de [place] o por telfono llamando al [phone] y presione la opcin 4.

## 2024-08-29 NOTE — Progress Notes (Signed)
 Follow-Up Visit   Subjective  Sherry Oliver is a 63 y.o. female who presents for the following: Atopic Dermatitis hands, feet, arms - last Dupixent  injection was 1 month ago. She switched insurance companies, and her previous insurance company won't pay for Dupixent . Patient now with Medicare, but co-pay is too expensive ($3000). Patient's pulmonologist is trying to get covered for asthma. And she will try to get pt assistance. She has recent flare of the right hand and wrist. Patient switched to nitrile gloves instead of vinyl. Rash started after that. She is using tacrolimus  ointment. She needs a rf of desoximethasone cream.  She has multiple contact allergies.   The following portions of the chart were reviewed this encounter and updated as appropriate: medications, allergies, medical history  Review of Systems:  No other skin or systemic complaints except as noted in HPI or Assessment and Plan.  Objective  Well appearing patient in no apparent distress; mood and affect are within normal limits.  Areas Examined: Face, hands, arms  Relevant physical exam findings are noted in the Assessment and Plan   Assessment & Plan  ECZEMA, UNSPECIFIED TYPE   This Visit - desoximetasone  (TOPICORT ) 0.25 % cream - Apply to affected areas one to two times daily as needed up to 2 weeks until rash improved. Avoid applying to face, groin, and axilla.  ATOPIC DERMATITIS Hands, feet, arms Started Dupixent  03/2020 Exam:  Pink scaly patch and papules at right wrist, right hand dorsum. 2% BSA  Chronic and persistent condition with duration or expected duration over one year. Condition is bothersome/symptomatic for patient. Currently flared on right hand and wrist. Patient with asthma, and has improved on Dupixent  injections. Currently access is in question due to affordability issues.  Atopic dermatitis - Severe, on Dupixent  (biologic medication).  Atopic dermatitis (eczema) is a chronic,  relapsing, pruritic condition that can significantly affect quality of life. It is often associated with allergic rhinitis and/or asthma and can require treatment with topical medications, phototherapy, or in severe cases biologic medications, which require long term medication management.    Treatment Plan: Continue Dupixent  300 mg/2mL SQ QOW. Patient denies side effects. Patient's pulmonologist sent Dupixent  Rx in for asthma, awaiting approval. And will apply for pt assistance 2 sample boxes of Dupixent  given to patient today (4 pens) Lot [id number] Exp 09/13/2026  Continue tacrolimus  ointment 1-2 times a day as needed.  Restart desoximetasone   0.25% cream twice a day up to 2 weeks at a time for flares dsp 60g 3Rf.  Potential side effects include allergic reaction, herpes infections, injection site reactions and conjunctivitis (inflammation of the eyes).  The use of Dupixent  requires long term medication management, including periodic office visits.    Long term medication management.  Patient is using long term (months to years) prescription medication  to control their dermatologic condition.  These medications require periodic monitoring to evaluate for efficacy and side effects and may require periodic laboratory monitoring.   Recommend gentle skin care.  Varicose Veins/Spider Veins - Dilated blue, purple or red veins at the lower extremities - Reassured - Smaller vessels can be treated by sclerotherapy (a procedure to inject a medicine into the veins to make them disappear) if desired, but the treatment is not covered by insurance. Larger vessels may be covered if symptomatic and we would refer to vascular surgeon if treatment desired. Patient may schedule sclerotherapy. Patient was advised that current price is $350, but may possibly increase in Jan 2026.  Pre-op  handout given   Return in about 6 months (around 02/27/2025) for TBSE, Pt + for BRCA2 gene, Atopic Dermatitis.  IAndrea Kerns, CMA, am acting as scribe for Rexene Rattler, MD .   Documentation: I have reviewed the above documentation for accuracy and completeness, and I agree with the above.  Rexene Rattler, MD

## 2024-08-30 ENCOUNTER — Other Ambulatory Visit: Payer: Self-pay | Admitting: Cardiovascular Disease

## 2024-08-30 DIAGNOSIS — Z87891 Personal history of nicotine dependence: Secondary | ICD-10-CM

## 2024-08-30 DIAGNOSIS — I2510 Atherosclerotic heart disease of native coronary artery without angina pectoris: Secondary | ICD-10-CM

## 2024-08-30 DIAGNOSIS — Z7982 Long term (current) use of aspirin: Secondary | ICD-10-CM

## 2024-08-30 DIAGNOSIS — E785 Hyperlipidemia, unspecified: Secondary | ICD-10-CM

## 2024-08-30 DIAGNOSIS — J432 Centrilobular emphysema: Secondary | ICD-10-CM

## 2024-08-30 DIAGNOSIS — J439 Emphysema, unspecified: Secondary | ICD-10-CM

## 2024-08-30 DIAGNOSIS — R0609 Other forms of dyspnea: Secondary | ICD-10-CM

## 2024-08-30 DIAGNOSIS — I10 Essential (primary) hypertension: Secondary | ICD-10-CM

## 2024-08-31 DIAGNOSIS — H52223 Regular astigmatism, bilateral: Secondary | ICD-10-CM | POA: Diagnosis not present

## 2024-09-26 ENCOUNTER — Encounter (HOSPITAL_COMMUNITY): Payer: Self-pay

## 2024-09-27 ENCOUNTER — Telehealth (HOSPITAL_COMMUNITY): Payer: Self-pay | Admitting: *Deleted

## 2024-09-27 ENCOUNTER — Telehealth (HOSPITAL_COMMUNITY): Payer: Self-pay | Admitting: Emergency Medicine

## 2024-09-27 MED ORDER — METOPROLOL TARTRATE 100 MG PO TABS: 100.0000 mg | ORAL_TABLET | Freq: Once | ORAL | 0 refills | Status: DC

## 2024-09-27 NOTE — Telephone Encounter (Signed)
 Attempted to call patient regarding upcoming cardiac CT appointment. Left message on voicemail with name and callback number Sid Seats RN Navigator Cardiac Imaging [facility] [phone] Office

## 2024-09-27 NOTE — Telephone Encounter (Signed)
 Reaching out to patient to offer assistance regarding upcoming cardiac imaging study; pt verbalizes understanding of appt date/time, parking situation and where to check in, pre-test NPO status and medications ordered, and verified current allergies; name and call back number provided for further questions should they arise Rockwell Alexandria RN Navigator Cardiac Imaging [facility] [phone] office [phone] cell

## 2024-09-28 ENCOUNTER — Ambulatory Visit
Admission: RE | Admit: 2024-09-28 | Discharge: 2024-09-28 | Disposition: A | Discharge status: Home / Self Care | Source: Ambulatory Visit | Attending: Cardiovascular Disease | Admitting: Cardiovascular Disease

## 2024-09-28 DIAGNOSIS — I2510 Atherosclerotic heart disease of native coronary artery without angina pectoris: Secondary | ICD-10-CM | POA: Insufficient documentation

## 2024-09-28 DIAGNOSIS — E785 Hyperlipidemia, unspecified: Secondary | ICD-10-CM | POA: Insufficient documentation

## 2024-09-28 DIAGNOSIS — R0609 Other forms of dyspnea: Secondary | ICD-10-CM | POA: Diagnosis present

## 2024-09-28 DIAGNOSIS — I10 Essential (primary) hypertension: Secondary | ICD-10-CM | POA: Insufficient documentation

## 2024-09-28 DIAGNOSIS — J432 Centrilobular emphysema: Secondary | ICD-10-CM | POA: Insufficient documentation

## 2024-09-28 DIAGNOSIS — Z87891 Personal history of nicotine dependence: Secondary | ICD-10-CM | POA: Diagnosis present

## 2024-09-28 DIAGNOSIS — J439 Emphysema, unspecified: Secondary | ICD-10-CM | POA: Insufficient documentation

## 2024-09-28 DIAGNOSIS — Z7982 Long term (current) use of aspirin: Secondary | ICD-10-CM | POA: Insufficient documentation

## 2024-09-28 MED ORDER — IOHEXOL 350 MG/ML SOLN
100.0000 mL | Freq: Once | INTRAVENOUS | Status: AC | PRN
  Administered 2024-09-28: 100 mL via INTRAVENOUS

## 2024-09-28 MED ORDER — METOPROLOL TARTRATE 5 MG/5ML IV SOLN
10.0000 mg | Freq: Once | INTRAVENOUS | Status: AC | PRN
  Administered 2024-09-28: 10 mg via INTRAVENOUS

## 2024-09-28 MED ORDER — NITROGLYCERIN 0.4 MG SL SUBL
0.8000 mg | SUBLINGUAL_TABLET | Freq: Once | SUBLINGUAL | Status: AC
  Administered 2024-09-28: 0.8 mg via SUBLINGUAL
  Filled 2024-09-28: qty 25

## 2024-09-28 MED ORDER — METOPROLOL TARTRATE 5 MG/5ML IV SOLN
INTRAVENOUS | Status: AC
  Filled 2024-09-28: qty 10

## 2024-09-28 MED ORDER — DILTIAZEM HCL 25 MG/5ML IV SOLN: 10.0000 mg | INTRAVENOUS | Status: DC | PRN

## 2024-10-03 ENCOUNTER — Ambulatory Visit: Admitting: Obstetrics & Gynecology

## 2024-10-03 ENCOUNTER — Encounter: Payer: Self-pay | Admitting: Obstetrics & Gynecology

## 2024-10-03 VITALS — BP 112/73 | HR 112 | Ht 62.0 in | Wt 160.0 lb

## 2024-10-03 DIAGNOSIS — Z1501 Genetic susceptibility to malignant neoplasm of breast: Secondary | ICD-10-CM | POA: Diagnosis not present

## 2024-10-03 DIAGNOSIS — L292 Pruritus vulvae: Secondary | ICD-10-CM | POA: Diagnosis not present

## 2024-10-03 DIAGNOSIS — Z1502 Genetic susceptibility to malignant neoplasm of ovary: Secondary | ICD-10-CM | POA: Diagnosis not present

## 2024-10-03 MED ORDER — CLOBETASOL PROPIONATE 0.05 % EX OINT: TOPICAL_OINTMENT | CUTANEOUS | 5 refills | Status: AC

## 2024-10-03 NOTE — Progress Notes (Signed)
" ° ° °  GYNECOLOGY PROGRESS NOTE  Subjective:    Patient ID: Sherry Oliver, female    DOB: 09/20/1960, 64 y.o.   MRN: [id number]  HPI  Patient is a 64 y.o. single P0 here today as a new patient. She was sent here by her primary care because she was diagnosed with + BRCA 2 last year. Her last mammogram was 01/2024. She had a breast MRI and biopsy about 3-4 years ago. She still has her ovaries/uterus/tubes. She went through menopause around age 44.   She had a ruptured appendix in 2008 and had laparoscopy then. She also had laparoscopy for ovarian pain in 2000.   She has some external vaginal itching. Her urologist prescribed vaginal estrogen. It has helped but is still present.  The following portions of the patient's history were reviewed and updated as appropriate: allergies, current medications, past family history, past medical history, past social history, past surgical history, and problem list.  Review of Systems Pertinent items are noted in HPI.  She reports that her last pap was about 4 years ago at [facility]. She reports an abnormal pap in about 1992, no treatments, always normal since then. She quit smoking around 2019.  Objective:   Blood pressure 112/73, pulse (!) 112, height 5' 2 (1.575 m), weight 160 lb (72.6 kg). Body mass index is 29.26 kg/m. Well nourished, well hydrated White female, no apparent distress She is ambulating and conversing normally. 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 Hereditary breast and ovarian cancer syndrome     2.     Vulvar itching- probably due to VVA and lichen  Plan:   1. Hereditary breast and ovarian cancer syndrome (Primary)  - MR BREAST BILATERAL W WO CONTRAST INC CAD; Future - check pelvic ultrasound to see if ovaries are even visible.  She is interested in discussing surgical removal of her ovaries and tubes so I will refer her over to the [facility]  office.  She will use the vaginal estrogen 2 nights per week and the temovate  3 nights per week. She will come back here in 4 weeks. If no better, then I will obtain a vulvar biopsy.  "

## 2024-10-16 ENCOUNTER — Ambulatory Visit: Admission: RE | Admit: 2024-10-16 | Source: Ambulatory Visit

## 2024-10-20 ENCOUNTER — Ambulatory Visit: Admission: RE | Admit: 2024-10-20

## 2024-10-20 DIAGNOSIS — Z1501 Genetic susceptibility to malignant neoplasm of breast: Secondary | ICD-10-CM

## 2024-10-20 MED ORDER — GADOBUTROL 1 MMOL/ML IV SOLN
7.5000 mL | Freq: Once | INTRAVENOUS | Status: AC | PRN
  Administered 2024-10-20: 7.5 mL via INTRAVENOUS

## 2024-10-24 ENCOUNTER — Other Ambulatory Visit

## 2024-10-31 ENCOUNTER — Ambulatory Visit: Admitting: Obstetrics & Gynecology

## 2024-11-07 ENCOUNTER — Ambulatory Visit: Admitting: Urology

## 2024-11-22 ENCOUNTER — Other Ambulatory Visit

## 2025-01-23 ENCOUNTER — Ambulatory Visit: Admitting: Family Medicine

## 2025-02-27 ENCOUNTER — Ambulatory Visit: Admitting: Dermatology
